# Patient Record
Sex: Male | Born: 1940 | Race: White | Hispanic: No | Marital: Married | Smoking: Never smoker
Health system: Southern US, Community
[De-identification: ages and names within clinical notes are randomized; demographics above are authoritative.]

## PROBLEM LIST (undated history)

## (undated) DIAGNOSIS — C9590 Leukemia, unspecified not having achieved remission: Secondary | ICD-10-CM

## (undated) DIAGNOSIS — N200 Calculus of kidney: Secondary | ICD-10-CM

## (undated) DIAGNOSIS — G629 Polyneuropathy, unspecified: Secondary | ICD-10-CM

## (undated) DIAGNOSIS — C4491 Basal cell carcinoma of skin, unspecified: Secondary | ICD-10-CM

## (undated) DIAGNOSIS — I10 Essential (primary) hypertension: Secondary | ICD-10-CM

## (undated) DIAGNOSIS — Z87442 Personal history of urinary calculi: Secondary | ICD-10-CM

## (undated) DIAGNOSIS — R739 Hyperglycemia, unspecified: Secondary | ICD-10-CM

## (undated) DIAGNOSIS — K5730 Diverticulosis of large intestine without perforation or abscess without bleeding: Secondary | ICD-10-CM

## (undated) DIAGNOSIS — G6181 Chronic inflammatory demyelinating polyneuritis: Secondary | ICD-10-CM

## (undated) DIAGNOSIS — B0229 Other postherpetic nervous system involvement: Secondary | ICD-10-CM

## (undated) DIAGNOSIS — R55 Syncope and collapse: Secondary | ICD-10-CM

## (undated) HISTORY — DX: Chronic inflammatory demyelinating polyneuritis: G61.81

## (undated) HISTORY — PX: OTHER SURGICAL HISTORY: SHX169

## (undated) HISTORY — DX: Diverticulosis of large intestine without perforation or abscess without bleeding: K57.30

## (undated) HISTORY — DX: Polyneuropathy, unspecified: G62.9

## (undated) HISTORY — DX: Essential (primary) hypertension: I10

## (undated) HISTORY — DX: Calculus of kidney: N20.0

## (undated) HISTORY — DX: Hyperglycemia, unspecified: R73.9

## (undated) HISTORY — DX: Syncope and collapse: R55

---

## 1986-02-02 HISTORY — PX: LUMBAR DISC SURGERY: SHX700

## 1991-02-03 DIAGNOSIS — N200 Calculus of kidney: Secondary | ICD-10-CM

## 1991-02-03 HISTORY — DX: Calculus of kidney: N20.0

## 1993-02-02 DIAGNOSIS — K5730 Diverticulosis of large intestine without perforation or abscess without bleeding: Secondary | ICD-10-CM

## 1993-02-02 HISTORY — DX: Diverticulosis of large intestine without perforation or abscess without bleeding: K57.30

## 1998-02-02 HISTORY — PX: LUMBAR DISC SURGERY: SHX700

## 1998-05-10 ENCOUNTER — Encounter: Payer: Self-pay | Admitting: Neurosurgery

## 1998-05-14 ENCOUNTER — Encounter: Payer: Self-pay | Admitting: Neurosurgery

## 1998-05-14 ENCOUNTER — Inpatient Hospital Stay (HOSPITAL_COMMUNITY): Admission: RE | Admit: 1998-05-14 | Discharge: 1998-05-15 | Payer: Self-pay | Admitting: Neurosurgery

## 1998-08-03 ENCOUNTER — Encounter: Payer: Self-pay | Admitting: Family Medicine

## 1999-09-03 ENCOUNTER — Encounter: Payer: Self-pay | Admitting: Family Medicine

## 1999-09-03 HISTORY — PX: DOPPLER ECHOCARDIOGRAPHY: SHX263

## 1999-09-23 ENCOUNTER — Encounter: Admission: RE | Admit: 1999-09-23 | Discharge: 1999-09-23 | Payer: Self-pay | Admitting: Family Medicine

## 1999-09-23 ENCOUNTER — Encounter: Payer: Self-pay | Admitting: Family Medicine

## 1999-10-20 ENCOUNTER — Encounter: Payer: Self-pay | Admitting: Surgery

## 1999-10-27 ENCOUNTER — Encounter (INDEPENDENT_AMBULATORY_CARE_PROVIDER_SITE_OTHER): Payer: Self-pay | Admitting: Specialist

## 1999-10-27 ENCOUNTER — Observation Stay (HOSPITAL_COMMUNITY): Admission: RE | Admit: 1999-10-27 | Discharge: 1999-10-29 | Payer: Self-pay | Admitting: Surgery

## 1999-10-27 ENCOUNTER — Encounter: Payer: Self-pay | Admitting: Surgery

## 1999-10-27 HISTORY — PX: CHOLECYSTECTOMY: SHX55

## 1999-10-28 ENCOUNTER — Encounter: Payer: Self-pay | Admitting: Gastroenterology

## 2000-11-02 ENCOUNTER — Encounter: Payer: Self-pay | Admitting: Family Medicine

## 2000-11-02 LAB — CONVERTED CEMR LAB: PSA: 0.8 ng/mL

## 2001-11-02 ENCOUNTER — Encounter: Payer: Self-pay | Admitting: Family Medicine

## 2001-11-02 LAB — CONVERTED CEMR LAB: PSA: 0.8 ng/mL

## 2002-12-04 ENCOUNTER — Encounter: Payer: Self-pay | Admitting: Family Medicine

## 2002-12-04 LAB — CONVERTED CEMR LAB: PSA: 0.9 ng/mL

## 2003-12-19 ENCOUNTER — Ambulatory Visit: Payer: Self-pay | Admitting: Family Medicine

## 2004-03-05 ENCOUNTER — Encounter: Payer: Self-pay | Admitting: Family Medicine

## 2004-03-05 LAB — CONVERTED CEMR LAB: PSA: 0.83 ng/mL

## 2004-03-19 ENCOUNTER — Ambulatory Visit: Payer: Self-pay | Admitting: Family Medicine

## 2004-03-21 ENCOUNTER — Ambulatory Visit: Payer: Self-pay | Admitting: Family Medicine

## 2004-04-02 ENCOUNTER — Ambulatory Visit: Payer: Self-pay | Admitting: Family Medicine

## 2004-06-17 ENCOUNTER — Observation Stay (HOSPITAL_COMMUNITY): Admission: RE | Admit: 2004-06-17 | Discharge: 2004-06-18 | Payer: Self-pay | Admitting: Surgery

## 2004-06-17 HISTORY — PX: HERNIA REPAIR: SHX51

## 2004-12-04 ENCOUNTER — Ambulatory Visit: Payer: Self-pay | Admitting: Family Medicine

## 2005-03-05 ENCOUNTER — Encounter: Payer: Self-pay | Admitting: Family Medicine

## 2005-03-05 LAB — CONVERTED CEMR LAB: PSA: 1.08 ng/mL

## 2005-03-25 ENCOUNTER — Ambulatory Visit: Payer: Self-pay | Admitting: Family Medicine

## 2005-03-31 ENCOUNTER — Ambulatory Visit: Payer: Self-pay | Admitting: Family Medicine

## 2005-04-09 ENCOUNTER — Ambulatory Visit: Payer: Self-pay | Admitting: Family Medicine

## 2005-06-16 ENCOUNTER — Ambulatory Visit: Payer: Self-pay | Admitting: Family Medicine

## 2005-08-06 ENCOUNTER — Encounter: Admission: RE | Admit: 2005-08-06 | Discharge: 2005-08-06 | Payer: Self-pay | Admitting: Neurology

## 2005-09-02 ENCOUNTER — Ambulatory Visit: Payer: Self-pay | Admitting: Family Medicine

## 2005-09-02 DIAGNOSIS — I10 Essential (primary) hypertension: Secondary | ICD-10-CM

## 2005-09-02 HISTORY — DX: Essential (primary) hypertension: I10

## 2005-09-16 ENCOUNTER — Ambulatory Visit: Payer: Self-pay | Admitting: Internal Medicine

## 2005-10-13 ENCOUNTER — Ambulatory Visit: Payer: Self-pay | Admitting: Family Medicine

## 2005-11-13 ENCOUNTER — Ambulatory Visit: Payer: Self-pay | Admitting: Family Medicine

## 2005-11-20 ENCOUNTER — Ambulatory Visit: Payer: Self-pay | Admitting: Family Medicine

## 2005-12-08 ENCOUNTER — Ambulatory Visit: Payer: Self-pay | Admitting: Gastroenterology

## 2005-12-09 ENCOUNTER — Encounter (INDEPENDENT_AMBULATORY_CARE_PROVIDER_SITE_OTHER): Payer: Self-pay | Admitting: *Deleted

## 2005-12-09 ENCOUNTER — Ambulatory Visit: Payer: Self-pay | Admitting: Gastroenterology

## 2005-12-19 HISTORY — PX: ESOPHAGOGASTRODUODENOSCOPY: SHX1529

## 2006-04-02 ENCOUNTER — Ambulatory Visit: Payer: Self-pay | Admitting: Family Medicine

## 2006-04-02 LAB — CONVERTED CEMR LAB
ALT: 60 units/L — ABNORMAL HIGH (ref 0–40)
AST: 40 units/L — ABNORMAL HIGH (ref 0–37)
Albumin: 4 g/dL (ref 3.5–5.2)
Alkaline Phosphatase: 66 units/L (ref 39–117)
BUN: 12 mg/dL (ref 6–23)
Basophils Absolute: 0 10*3/uL (ref 0.0–0.1)
Basophils Relative: 0.1 % (ref 0.0–1.0)
Bilirubin, Direct: 0.1 mg/dL (ref 0.0–0.3)
CO2: 31 meq/L (ref 19–32)
Calcium: 9.3 mg/dL (ref 8.4–10.5)
Chloride: 99 meq/L (ref 96–112)
Cholesterol: 193 mg/dL (ref 0–200)
Creatinine, Ser: 1.2 mg/dL (ref 0.4–1.5)
Eosinophils Absolute: 0.2 10*3/uL (ref 0.0–0.6)
Eosinophils Relative: 2.9 % (ref 0.0–5.0)
GFR calc Af Amer: 78 mL/min
GFR calc non Af Amer: 65 mL/min
Glucose, Bld: 104 mg/dL — ABNORMAL HIGH (ref 70–99)
HCT: 48.2 % (ref 39.0–52.0)
HDL: 37 mg/dL — ABNORMAL LOW (ref >39.0)
Hemoglobin: 16.3 g/dL (ref 13.0–17.0)
LDL Cholesterol: 128 mg/dL — ABNORMAL HIGH (ref 0–99)
Lymphocytes Relative: 14.8 % (ref 12.0–46.0)
MCHC: 33.8 g/dL (ref 30.0–36.0)
MCV: 91.4 fL (ref 78.0–100.0)
Monocytes Absolute: 0.7 10*3/uL (ref 0.2–0.7)
Monocytes Relative: 11.3 % — ABNORMAL HIGH (ref 3.0–11.0)
Neutro Abs: 4.4 10*3/uL (ref 1.4–7.7)
Neutrophils Relative %: 70.9 % (ref 43.0–77.0)
PSA: 1.18 ng/mL (ref 0.10–4.00)
Platelets: 283 10*3/uL (ref 150–400)
Potassium: 3.9 meq/L (ref 3.5–5.1)
RBC: 5.27 M/uL (ref 4.22–5.81)
RDW: 12.2 % (ref 11.5–14.6)
Sodium: 137 meq/L (ref 135–145)
TSH: 2.58 microintl units/mL (ref 0.35–5.50)
Total Bilirubin: 1.2 mg/dL (ref 0.3–1.2)
Total CHOL/HDL Ratio: 5.2
Total Protein: 6.3 g/dL (ref 6.0–8.3)
Triglycerides: 141 mg/dL (ref 0–149)
VLDL: 28 mg/dL (ref 0–40)
WBC: 6.2 10*3/uL (ref 4.5–10.5)

## 2006-04-08 ENCOUNTER — Ambulatory Visit: Payer: Self-pay | Admitting: Family Medicine

## 2006-04-22 ENCOUNTER — Ambulatory Visit: Payer: Self-pay | Admitting: Family Medicine

## 2006-04-22 LAB — CONVERTED CEMR LAB
Calcium: 8.9 mg/dL (ref 8.4–10.5)
Phosphorus: 3 mg/dL (ref 2.3–4.6)

## 2006-04-26 ENCOUNTER — Ambulatory Visit: Payer: Self-pay | Admitting: Family Medicine

## 2006-10-06 ENCOUNTER — Encounter: Payer: Self-pay | Admitting: Family Medicine

## 2006-10-06 DIAGNOSIS — R011 Cardiac murmur, unspecified: Secondary | ICD-10-CM | POA: Insufficient documentation

## 2006-10-06 DIAGNOSIS — G454 Transient global amnesia: Secondary | ICD-10-CM

## 2006-10-06 DIAGNOSIS — I10 Essential (primary) hypertension: Secondary | ICD-10-CM | POA: Insufficient documentation

## 2006-10-07 ENCOUNTER — Encounter: Payer: Self-pay | Admitting: Family Medicine

## 2006-10-19 ENCOUNTER — Ambulatory Visit: Payer: Self-pay | Admitting: Family Medicine

## 2006-10-19 LAB — CONVERTED CEMR LAB: Glucose, Bld: 98 mg/dL (ref 70–99)

## 2006-12-10 ENCOUNTER — Ambulatory Visit: Payer: Self-pay | Admitting: Family Medicine

## 2007-01-10 ENCOUNTER — Encounter: Payer: Self-pay | Admitting: Family Medicine

## 2007-04-14 ENCOUNTER — Ambulatory Visit: Payer: Self-pay | Admitting: Family Medicine

## 2007-04-17 LAB — CONVERTED CEMR LAB
Albumin: 4 g/dL (ref 3.5–5.2)
Alkaline Phosphatase: 55 units/L (ref 39–117)
BUN: 11 mg/dL (ref 6–23)
Basophils Absolute: 0 10*3/uL (ref 0.0–0.1)
Cholesterol: 183 mg/dL (ref 0–200)
Creatinine, Ser: 1.2 mg/dL (ref 0.4–1.5)
Direct LDL: 128.4 mg/dL
GFR calc Af Amer: 78 mL/min
HDL: 37.1 mg/dL — ABNORMAL LOW (ref >39.0)
Hemoglobin: 15.5 g/dL (ref 13.0–17.0)
LDL Cholesterol: 122 mg/dL — ABNORMAL HIGH (ref 0–99)
MCHC: 33.6 g/dL (ref 30.0–36.0)
Microalb Creat Ratio: 5.2 mg/g (ref 0.0–30.0)
Microalb, Ur: 0.8 mg/dL (ref 0.0–1.9)
Monocytes Absolute: 0.6 10*3/uL (ref 0.2–0.7)
Monocytes Relative: 9.8 % (ref 3.0–11.0)
Platelets: 246 10*3/uL (ref 150–400)
Potassium: 4.1 meq/L (ref 3.5–5.1)
RDW: 12.4 % (ref 11.5–14.6)
TSH: 1.88 microintl units/mL (ref 0.35–5.50)
Total Bilirubin: 1.2 mg/dL (ref 0.3–1.2)
Total CHOL/HDL Ratio: 4.9
Triglycerides: 119 mg/dL (ref 0–149)

## 2007-04-18 ENCOUNTER — Ambulatory Visit: Payer: Self-pay | Admitting: Family Medicine

## 2007-05-06 ENCOUNTER — Ambulatory Visit: Payer: Self-pay | Admitting: Family Medicine

## 2007-05-09 ENCOUNTER — Encounter (INDEPENDENT_AMBULATORY_CARE_PROVIDER_SITE_OTHER): Payer: Self-pay | Admitting: *Deleted

## 2007-05-16 ENCOUNTER — Telehealth: Payer: Self-pay | Admitting: Family Medicine

## 2007-06-17 ENCOUNTER — Encounter: Payer: Self-pay | Admitting: Family Medicine

## 2007-06-30 ENCOUNTER — Ambulatory Visit: Payer: Self-pay | Admitting: Family Medicine

## 2007-08-04 ENCOUNTER — Other Ambulatory Visit: Payer: Self-pay

## 2007-08-05 ENCOUNTER — Inpatient Hospital Stay: Payer: Self-pay | Admitting: Internal Medicine

## 2007-08-05 ENCOUNTER — Encounter: Payer: Self-pay | Admitting: Family Medicine

## 2007-08-05 DIAGNOSIS — R55 Syncope and collapse: Secondary | ICD-10-CM

## 2007-08-05 HISTORY — DX: Syncope and collapse: R55

## 2007-08-18 ENCOUNTER — Ambulatory Visit: Payer: Self-pay | Admitting: Family Medicine

## 2007-09-28 ENCOUNTER — Ambulatory Visit: Payer: Self-pay | Admitting: Family Medicine

## 2007-09-28 LAB — CONVERTED CEMR LAB
BUN: 16 mg/dL (ref 6–23)
CO2: 28 meq/L (ref 19–32)
Calcium: 8.5 mg/dL (ref 8.4–10.5)
Creatinine, Ser: 1.1 mg/dL (ref 0.4–1.5)
Glucose, Bld: 94 mg/dL (ref 70–99)

## 2007-10-12 ENCOUNTER — Ambulatory Visit: Payer: Self-pay | Admitting: Family Medicine

## 2007-11-07 ENCOUNTER — Ambulatory Visit: Payer: Self-pay | Admitting: Family Medicine

## 2007-11-21 ENCOUNTER — Encounter: Payer: Self-pay | Admitting: Family Medicine

## 2008-02-06 ENCOUNTER — Ambulatory Visit: Payer: Self-pay | Admitting: Family Medicine

## 2008-03-28 ENCOUNTER — Encounter: Payer: Self-pay | Admitting: Family Medicine

## 2008-04-19 ENCOUNTER — Ambulatory Visit: Payer: Self-pay | Admitting: Family Medicine

## 2008-04-19 LAB — CONVERTED CEMR LAB
ALT: 58 units/L — ABNORMAL HIGH (ref 0–53)
AST: 33 units/L (ref 0–37)
Basophils Relative: 0.2 % (ref 0.0–3.0)
Bilirubin, Direct: 0 mg/dL (ref 0.0–0.3)
Chloride: 106 meq/L (ref 96–112)
Eosinophils Relative: 2.6 % (ref 0.0–5.0)
HCT: 45.7 % (ref 39.0–52.0)
LDL Cholesterol: 120 mg/dL — ABNORMAL HIGH (ref 0–99)
MCV: 95.6 fL (ref 78.0–100.0)
Microalb, Ur: 1.2 mg/dL (ref 0.0–1.9)
Monocytes Absolute: 0.7 10*3/uL (ref 0.1–1.0)
Neutrophils Relative %: 71.3 % (ref 43.0–77.0)
Potassium: 3.9 meq/L (ref 3.5–5.1)
RBC: 4.78 M/uL (ref 4.22–5.81)
Total CHOL/HDL Ratio: 5
Total Protein: 6.3 g/dL (ref 6.0–8.3)
WBC: 7 10*3/uL (ref 4.5–10.5)

## 2008-04-25 ENCOUNTER — Ambulatory Visit: Payer: Self-pay | Admitting: Family Medicine

## 2008-04-26 LAB — CONVERTED CEMR LAB
Iron: 158 ug/dL (ref 42–165)
Saturation Ratios: 56.5 % — ABNORMAL HIGH (ref 20.0–50.0)
Transferrin: 199.8 mg/dL — ABNORMAL LOW (ref 212.0–360.0)
Vitamin B-12: 426 pg/mL (ref 211–911)

## 2008-10-29 ENCOUNTER — Ambulatory Visit: Payer: Self-pay | Admitting: Family Medicine

## 2008-11-19 ENCOUNTER — Encounter: Payer: Self-pay | Admitting: Family Medicine

## 2008-11-22 ENCOUNTER — Ambulatory Visit: Payer: Self-pay | Admitting: Family Medicine

## 2009-05-23 ENCOUNTER — Ambulatory Visit: Payer: Self-pay | Admitting: Family Medicine

## 2009-05-23 LAB — CONVERTED CEMR LAB
Alkaline Phosphatase: 51 units/L (ref 39–117)
Basophils Absolute: 0 10*3/uL (ref 0.0–0.1)
Bilirubin, Direct: 0.2 mg/dL (ref 0.0–0.3)
CO2: 30 meq/L (ref 19–32)
Calcium: 9.4 mg/dL (ref 8.4–10.5)
Eosinophils Relative: 2.8 % (ref 0.0–5.0)
GFR calc non Af Amer: 63.79 mL/min (ref >60)
HDL: 36.2 mg/dL — ABNORMAL LOW (ref >39.00)
LDL Cholesterol: 107 mg/dL — ABNORMAL HIGH (ref 0–99)
Monocytes Relative: 8.9 % (ref 3.0–12.0)
Neutrophils Relative %: 70.6 % (ref 43.0–77.0)
PSA: 1.58 ng/mL (ref 0.10–4.00)
Platelets: 225 10*3/uL (ref 150.0–400.0)
RDW: 13.2 % (ref 11.5–14.6)
Sodium: 145 meq/L (ref 135–145)
TSH: 1.78 microintl units/mL (ref 0.35–5.50)
Total Bilirubin: 0.7 mg/dL (ref 0.3–1.2)
Total CHOL/HDL Ratio: 5
Triglycerides: 146 mg/dL (ref 0.0–149.0)
VLDL: 29.2 mg/dL (ref 0.0–40.0)
WBC: 5.9 10*3/uL (ref 4.5–10.5)

## 2009-05-27 ENCOUNTER — Ambulatory Visit: Payer: Self-pay | Admitting: Family Medicine

## 2009-05-27 LAB — CONVERTED CEMR LAB
Creatinine,U: 113.5 mg/dL
Microalb Creat Ratio: 18.5 mg/g (ref 0.0–30.0)

## 2009-06-14 ENCOUNTER — Ambulatory Visit: Payer: Self-pay | Admitting: Family Medicine

## 2009-06-14 DIAGNOSIS — J209 Acute bronchitis, unspecified: Secondary | ICD-10-CM

## 2009-06-14 LAB — CONVERTED CEMR LAB: OCCULT 3: NEGATIVE

## 2009-06-17 ENCOUNTER — Encounter (INDEPENDENT_AMBULATORY_CARE_PROVIDER_SITE_OTHER): Payer: Self-pay | Admitting: *Deleted

## 2009-07-08 ENCOUNTER — Encounter: Payer: Self-pay | Admitting: Family Medicine

## 2009-09-05 ENCOUNTER — Encounter (INDEPENDENT_AMBULATORY_CARE_PROVIDER_SITE_OTHER): Payer: Self-pay | Admitting: *Deleted

## 2009-11-04 ENCOUNTER — Ambulatory Visit: Payer: Self-pay | Admitting: Family Medicine

## 2009-11-05 LAB — CONVERTED CEMR LAB
CO2: 30 meq/L (ref 19–32)
Chloride: 101 meq/L (ref 96–112)
Creatinine, Ser: 1.1 mg/dL (ref 0.4–1.5)
Hgb A1c MFr Bld: 6.1 % (ref 4.6–6.5)
Potassium: 4.5 meq/L (ref 3.5–5.1)

## 2010-01-21 ENCOUNTER — Encounter: Payer: Self-pay | Admitting: Family Medicine

## 2010-03-04 NOTE — Consult Note (Signed)
Summary: Dr.Michael Cartwright,[facility],Note  Dr.Michael Cartwright,[facility],Note   Imported By: Beau Fanny 07/09/2009 13:07:52  _____________________________________________________________________  External Attachment:    Type:   Image     Comment:   External Document

## 2010-03-04 NOTE — Assessment & Plan Note (Signed)
Summary: ACUTE- CHEST COUGH  CYD   Vital Signs:  Patient profile:   70 year old male Height:      76.5 inches Weight:      220.0 pounds BMI:     26.53 O2 Sat:      97 % on Room air Temp:     97.9 degrees F oral Pulse rate:   69 / minute Pulse rhythm:   regular BP sitting:   110 / 70  (left arm) Cuff size:   regular  Vitals Entered By: Benny Lennert CMA Duncan Dull) (Jun 14, 2009 12:13 PM)  O2 Flow:  Room air  History of Present Illness: Chief complaint chest cold  No antibiotics in last 2-3 months.  On prednisone for neuropathy. ..since 2007.  Using guafensin.No decongestant.   Acute Visit History:      The patient complains of cough, nasal discharge, sinus problems, and sore throat.  These symptoms began 6 days ago.  He denies earache, fever, and vomiting.  Other comments include: sneeze..clear mucus initially..no green nasal discharge and productive cough.        The patient notes wheezing and shortness of breath.  The cough interferes with his sleep.  The character of the cough is described as productive.        He complains of sinus pressure and ears being blocked.        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 One Tab By Mouth At Night. 2)  Multivitamins   Tabs (Multiple Vitamin) .... Take One By Mouth Once A Day 3)  Caltrate 600 + D .... 1 Twice A Day By Mouth 4)  Aspirin 325 Mg  Tbec (Aspirin) .... Take One By Mouth Once A Day 5)  Prednisone 10 Mg  Tabs (Prednisone) .Marland Kitchen.. 1 Tablet By Mouth Every Day 6)  Timoptic 0.5 % Soln (Timolol Maleate) .Marland Kitchen.. 1 Drop in Each Eye At Bedtime 7)  B-12 Dots 500 Mcg Subl (Cyanocobalamin) .... Once A Day 8)  Azithromycin 250 Mg Tabs (Azithromycin) .... 2 Tab By Mouth X 1 , Then 1 Tab By Mouth Daily  Allergies: 1)  * Sulfa (Sulfonamides) Group  Past History:  Past medical, surgical, family and social histories (including risk factors) reviewed, and no changes noted (except as noted below).  Past Medical History: Reviewed history from 10/06/2006 and no changes required. Diverticulosis, colon Hypertension 08/07  Past Surgical History: Reviewed history from 04/25/2008 and no changes required. Lumbar surgery  1988 L3/L4 disc rupture with surgery 2000 Kidney stone x 2 1993 Cholelithiasis via abd U/S 01/95 ACBE diverticula 01/95 ABD U/S fatty liver 08/01 Cholecystectomy Daphine Deutscher)  10/27/99 ECHO trace T.R.: T.R. P/R. borderline LVH 08/01 Carotid U/S wnl 04/02 Head MRI/EEG 03/02 EEG wnl 07/03 Lap bilat hernia repair 06/17/04  NCV/EMG 07/09/05 EGD, gastritis, H. H. 12/19/05 Colonoscopy, colitis infec. vs ulcerative (Dr Jarold Motto) 02/05/06 Dexa wnl 04/12/06 HOSP [facility] Syncope presumed hypotension (Observation) 08/05/07  Family History: Reviewed history from 05/27/2009 and no changes required. Father: dec 86  Parkinson disease and kidney failure Mother: dec 72 MI HTN Brother dec 77  Ca of liver cirrhosis, nonalcoholic Sister A 79  elevated white count now diagnosed with acute leukemia  Sister A 74 arthritis. CV + Mother BP + Mother DM - none CA - none Stroke - none  Social History: Reviewed history from 04/25/2008 and no changes  required. 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 CV:  Denies chest pain or discomfort. GI:  Denies abdominal pain.  Physical Exam  General:  Well-developed,well-nourished,in no acute distress; alert,appropriate and cooperative throughout examination Head:  no maxiallry sinus ttp Ears:  clear fluid B TMs Nose:  nasal dischargemucosal pallor.   Mouth:  Oral mucosa and oropharynx without lesions or exudates.  Teeth in good repair. Neck:  no carotid bruit or thyromegaly no cervical or supraclavicular lymphadenopathy  Lungs:  Normal respiratory effort, chest expands symmetrically. Lungs are clear to auscultation, no crackles or wheezes. Heart:  Normal rate and regular rhythm. S1 and S2 normal without gallop, murmur, click, rub or other extra sounds. Pulses:  R and L carotid,radial,femoral,dorsalis pedis and posterior tibial pulses are full and equal bilaterally   Impression & Recommendations:  Problem # 1:  ACUTE BRONCHITIS (ICD-466.0)  Take antibiotics and other medications as directed. Encouraged to push clear liquids, get enough rest, and take acetaminophen as needed. To be seen in 5-7 days if no improvement, sooner if worse.  His updated medication list for this problem includes:    Azithromycin 250 Mg Tabs (Azithromycin) .Marland Kitchen... 2 tab by mouth x 1 , then 1 tab by mouth daily  Orders: Prescription Created Electronically [phone])  Complete Medication List: 1)  Lisinopril 5 Mg Tabs (Lisinopril) .... One tab by mouth at night. 2)  Multivitamins Tabs (Multiple vitamin) .... Take one by mouth once a day 3)  Caltrate 600 + D  .... 1 twice a day by mouth 4)  Aspirin 325 Mg Tbec (Aspirin) .... Take one by mouth once a day 5)  Prednisone 10 Mg Tabs (Prednisone) .Marland Kitchen.. 1 tablet by mouth every day 6)   Timoptic 0.5 % Soln (Timolol maleate) .Marland Kitchen.. 1 drop in each eye at bedtime 7)  B-12 Dots 500 Mcg Subl (Cyanocobalamin) .... Once a day 8)  Azithromycin 250 Mg Tabs (Azithromycin) .... 2 tab by mouth x 1 , then 1 tab by mouth daily  Patient Instructions: 1)  Guafenesin, nasal saline irrigation.  2)   Start antibiotics. 3)   Call if not improvng in 48-72 hours days. Go to ER if severe SOB.  Prescriptions: AZITHROMYCIN 250 MG TABS (AZITHROMYCIN) 2 tab by mouth x 1 , then 1 tab by mouth daily  #6 x 0   Entered and Authorized by:   Kerby Nora MD   Signed by:   Kerby Nora MD on 06/14/2009   Method used:   Electronically to        [id number] * (retail)       [address]  [place], [place]  [address]       Ph: [phone] or [phone]       Fax: [phone]   RxID:   [id number]   Current Allergies (reviewed today): * SULFA (SULFONAMIDES) GROUP

## 2010-03-04 NOTE — Letter (Signed)
Summary: Nadara Eaton letter  [place] at [facility]  [address] [place], [place] [address]   Phone: [phone]  Fax: [phone]       09/05/2009 MRN: [id number]  JOSECARLOS HARRIOTT [address] [facility] [address] [place], [place]  [address]  Dear Mr. [facility] [place], and [place] announce the retirement of Arta Silence, M.D., from full-time practice at the [facility] office effective August 01, 2009 and his plans of returning part-time.  It is important to Dr. Hetty Ely and to our practice that you understand that [facility] has seven physicians in our office for your health care needs.  We will continue to offer the same exceptional care that you have today.    Dr. Hetty Ely has spoken to many of you about his plans for retirement and returning part-time in the fall.   We will continue to work with you through the transition to schedule appointments for you in the office and meet the high standards that [facility] is committed to.   Again, it is with great pleasure that we share the news that Dr. Hetty Ely will return to [facility] in October of 2011 with a reduced schedule.    If you have any questions, or would like to request an appointment with one of our physicians, please call us at [phone] and press the option for Scheduling an appointment.  We take pleasure in providing you with excellent patient care and look forward to seeing you at your next office visit.  Our [facility] Physicians are:  Tillman Abide, M.D. Laurita Quint, M.D. Roxy Manns, M.D. Kerby Nora, M.D. Hannah Beat, M.D. Ruthe Mannan, M.D. We proudly welcomed Raechel Ache, M.D. and Eustaquio Boyden, M.D. to the practice in July/August 2011.  Sincerely,  [facility]

## 2010-03-04 NOTE — Letter (Signed)
Summary: Results Follow up Letter  [place] at [facility]  [address] [place], [place] [address]   Phone: [phone]  Fax: [phone]    06/17/2009 MRN: [id number]  JEANPIERRE THEBEAU [address] [place], [place]  [address]  Dear Mr. Campas,  The following are the results of your recent test(s):  Test         Result    Pap Smear:        Normal _____  Not Normal _____ Comments: ______________________________________________________ Cholesterol: LDL(Bad cholesterol):         Your goal is less than:         HDL (Good cholesterol):       Your goal is more than: Comments:  ______________________________________________________ Mammogram:        Normal _____  Not Normal _____ Comments:  ___________________________________________________________________ Hemoccult:        Normal __X___  Not normal _______ Comments:  Please repeat in one year.  _____________________________________________________________________ Other Tests:    We routinely do not discuss normal results over the telephone.  If you desire a copy of the results, or you have any questions about this information we can discuss them at your next office visit.   Sincerely,    Laurita Quint, MD

## 2010-03-04 NOTE — Assessment & Plan Note (Signed)
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  Has had some motor weakness in legs.  Due for follow up of labs.  "I feel fine but I'm weaker than before."  Still able to transfer and do ADLs, but can't run.  Loss of sensation on feet and decrease in bilateral lower extremity reflexes.    Mild increase in glucose after starting prednisone.  No formal dx of DM2.  Here for routine labs.   HTN- noted only after started prednisone and w/o CP/SOB/BLE edema.  Doing well and w/o complaint concerning BP med.    Allergies: 1)  * Sulfa (Sulfonamides) Group  Past History:  Past Medical History: Diverticulosis, colon Hypertension 08/07 chronic inflammatory demylenating polyneuropathy- prednisone per [facility] hyperglycemia related to chronic steroid use  Social History: Reviewed history from 06/14/2009 and no changes required. 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  Never Smoked  Review of Systems       See HPI.  Otherwise negative.    Physical Exam  General:  GEN: nad, alert and oriented HEENT: mucous membranes moist NECK: supple w/o LA CV: rrr.  no murmur PULM: ctab, no inc wob ABD: soft, +bs EXT: no edema SKIN: no acute rash  B feet with lack on sensation to touch or pinprick.  2+ DP pulses and no skin breakdown.    Impression & Recommendations:  Problem # 1:   NEUROPATHY, OTHER INFLAMMATORY AND TOXIC, JACKSON, STEROIDS (ICD-357.89) Continue prednisone per [facility].   Pt had questions about the shingles vaccine.  I directed him to talk with [facility].  If they support him getting it, he can check with insurance about coverage and then contact our clinic.  He understood.   Problem # 2:  HYPERTENSION (ICD-401.9) No change in meds.  contact with labs.  His updated medication list for this problem includes:    Lisinopril 5 Mg Tabs (Lisinopril) ..... One tab by mouth at night.  Orders: TLB-BMP (Basic Metabolic Panel-BMET) (80048-METABOL)  Problem # 3:  HYPERGLYCEMIA, 104 (ICD-790.29) No change in meds.  contact with labs.  Orders: TLB-A1C / Hgb A1C (Glycohemoglobin) (83036-A1C)  Complete Medication List: 1)  Lisinopril 5 Mg Tabs (Lisinopril) .... One tab by mouth at night. 2)  Multivitamins Tabs (Multiple vitamin) .... Take one by mouth once a day 3)  Caltrate 600 + D  .... 1 twice a day by mouth 4)  Aspirin 325 Mg Tbec (Aspirin) .... Take one by mouth once a day 5)  Prednisone 10 Mg Tabs (Prednisone) .Marland Kitchen.. 1 tablet by mouth every day 6)  Timoptic 0.5 % Soln (Timolol maleate) .Marland Kitchen.. 1 drop in each eye at bedtime 7)  B-12 Dots 500 Mcg Subl (Cyanocobalamin) .... Once a day  Other Orders: Flu  Vaccine 60yrs + MEDICARE PATIENTS [phone]) Administration Flu vaccine - MCR (Z3664)   Patient Instructions: 1)  Come back for a physical in May 2012. 2)  Labs before the visit (cmet/lipid/PSA/A1c). 3)  Let me know if you have concerns in the meantime.    Current Allergies (reviewed today): * SULFA (SULFONAMIDES) GROUP    Flu Vaccine Consent Questions     Do you have a history of severe allergic reactions to this vaccine? no    Any prior history of allergic reactions to egg and/or gelatin? no    Do you have a sensitivity to the preservative Thimersol? no    Do you have a past history of Guillan-Barre Syndrome? no    Do you currently have an acute febrile illness?  no    Have you ever had a severe reaction to latex? no    Vaccine information given and explained to patient? yes    Are you currently pregnant? no    Lot Number:[id number]   Exp Date:08/02/2010   Site Given  Left Deltoid IMflu Lugene Fuquay CMA (AAMA)  November 04, 2009 12:16 PM

## 2010-03-04 NOTE — Assessment & Plan Note (Signed)
Summary: COMPLETE PHYSICAL   Vital Signs:  Patient profile:   70 year old male Height:      76.5 inches Weight:      221.25 pounds BMI:     26.68 Temp:     98.0 degrees F oral Pulse rate:   72 / minute Pulse rhythm:   regular BP sitting:   118 / 78  (left arm) Cuff size:   regular  Vitals Entered By: Sydell Axon LPN (May 27, 2009 9:21 AM) CC: 30 minute checkup, hemoccult cards given to patient   History of Present Illness: Pt here for followup. Was seen by Neurology at [facility] and the only cheange was to add B12 replacement at 500mg  a day. He wass continued on Prednisone 10mg . He has been told not to overdue activity so as not to further  damage muscles. He denies other problems and feels well. He had a good Easter.  Preventive Screening-Counseling & Management  Alcohol-Tobacco     Alcohol drinks/day: 0     Smoking Status: never     Passive Smoke Exposure: no  Caffeine-Diet-Exercise     Caffeine use/day: none, some sodas     Does Patient Exercise: yes     Type of exercise: as much as poss, bicycle and walking.     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 One Tab By Mouth At Night. 2)  Multivitamins   Tabs (Multiple Vitamin) .... Take One By Mouth Once A Day 3)   Caltrate 600 + D .... 1 Twice A Day By Mouth 4)  Aspirin 325 Mg  Tbec (Aspirin) .... Take One By Mouth Once A Day 5)  Prednisone 10 Mg  Tabs (Prednisone) .Marland Kitchen.. 1 Tablet By Mouth Every Day 6)  Timoptic 0.5 % Soln (Timolol Maleate) .Marland Kitchen.. 1 Drop in Each Eye At Bedtime  Allergies: 1)  * Sulfa (Sulfonamides) Group  Past History:  Past Medical History: Last updated: 10/06/2006 Diverticulosis, colon Hypertension 08/07  Past Surgical History: Last updated: 04/25/2008 Lumbar surgery  1988 L3/L4 disc rupture with surgery 2000 Kidney stone x 2 1993 Cholelithiasis via abd U/S 01/95 ACBE diverticula 01/95 ABD U/S fatty liver 08/01 Cholecystectomy Daphine Deutscher)  10/27/99 ECHO trace T.R.: T.R. P/R. borderline LVH 08/01 Carotid U/S wnl 04/02 Head MRI/EEG 03/02 EEG wnl 07/03 Lap bilat hernia repair 06/17/04 NCV/EMG 07/09/05 EGD, gastritis, H. H. 12/19/05 Colonoscopy, colitis infec. vs ulcerative (Dr Jarold Motto) 02/05/06 Dexa wnl 04/12/06 HOSP [facility] Syncope presumed hypotension (Observation) 08/05/07  Family History: Last updated: 05/27/2009 Father: dec 86  Parkinson disease and kidney failure Mother: dec 72 MI HTN Brother dec 77  Ca of liver cirrhosis, nonalcoholic Sister A 79  elevated white count now diagnosed with acute leukemia  Sister A 74 arthritis. 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 CV + Mother BP + Mother DM - none CA - none Stroke - none  Social History: Caffeine use/day:  none, some sodas  Review of Systems General:  Complains of weakness; denies chills, fatigue, fever, sweats, and weight loss; in legs now getting generalized.. Eyes:  Denies blurring, discharge, eye pain, and itching. ENT:  Denies decreased hearing, ear discharge, and earache. CV:  Complains of swelling of feet; denies chest pain or discomfort, fainting, fatigue, palpitations, shortness of breath with exertion, and swelling of hands; rare with being up.Marland Kitchen Resp:  Denies chest pain with inspiration, shortness of breath, and wheezing. GI:  Denies abdominal pain, bloody stools, change in bowel habits, constipation, dark tarry stools, diarrhea, indigestion, loss of appetite, nausea, vomiting, vomiting blood, and yellowish skin color. GU:  Denies discharge, dysuria, nocturia, and urinary frequency. MS:  Complains of muscle weakness; denies joint pain, low back pain, and muscle aches; chronic of the legs worse than anywhere else.. Derm:  Denies dryness, itching, and rash. Neuro:  Complains of numbness and poor balance; denies tingling and tremors; Balance esp backwards.Marland KitchenMarland KitchenMarland KitchenNumbness of feet mild.Marland Kitchen  Physical Exam  General:  Well-developed,well-nourished,in no acute distress; alert,appropriate and cooperative throughout examination Head:  Normocephalic and atraumatic without obvious abnormalities. No apparent alopecia or balding. Eyes:  Conjunctiva clear bilaterally.  Ears:  External ear exam shows no significant lesions or deformities.  Otoscopic examination reveals clear canals, tympanic membranes are intact bilaterally without bulging, retraction, inflammation or discharge. Hearing is grossly normal bilaterally. Nose:  External nasal examination shows no deformity or inflammation. Nasal mucosa are pink and moist without lesions  or exudates. Mouth:  Oral mucosa and oropharynx without lesions or exudates.  Teeth in good repair. Neck:  No deformities, masses, or tenderness noted. Chest Wall:  No deformities, masses, tenderness or gynecomastia noted. Can see chest wall muscle wasting on the right pectoralis area. Breasts:  No masses or gynecomastia noted Lungs:  Normal respiratory effort, chest expands symmetrically. Lungs are clear to auscultation, no crackles or wheezes. Heart:  Normal rate and regular rhythm. S1 and S2 normal without gallop, murmur, click, rub or other extra sounds. Abdomen:  Bowel sounds positive,abdomen soft and non-tender without masses, organomegaly or hernias noted. Rectal:  No external abnormalities noted. Normal sphincter tone. No rectal masses or tenderness. Gneg. Genitalia:  Testes bilaterally descended without nodularity, tenderness or masses. No scrotal masses or lesions. No penis lesions or urethral discharge. Prostate:  Prostate gland firm and smooth, no enlargement, nodularity, tenderness, mass, asymmetry or induration. 20 gms. Msk:  No deformity or scoliosis noted of thoracic or lumbar spine.  2-3cm vertical incision over middle of vastus lateralis of left leg, healed well. Mild weakness of the bilat upper legs. Obvious muscle wasting of right anterior chestwall. Pulses:  R and L carotid,radial,femoral,dorsalis pedis and posterior tibial pulses are full and equal bilaterally Extremities:  Gross atrophy of muscle mass,  most evident in the thighs, prox> distal. Neurologic:  No cranial nerve deficits noted. Station and gait are normal for him, halting and "flap foot placing." Plantar reflexes are down-going bilaterally. DTRs are symmetrical throughout. Sensory, motor and coordinative functions appear intact but LE useage and gait decreased. Proximal LE muscles> distal  weak. Skin:  Intact without suspicious lesions or rashes Cervical Nodes:  No lymphadenopathy noted Inguinal Nodes:  No  significant adenopathy Psych:  Cognition and judgment appear intact. Alert and cooperative with normal attention span and concentration. No apparent delusions, illusions, hallucinations   Impression & Recommendations:  Problem # 1:  VASOVAGAL SYNCOPE (ICD-780.2) Assessment Improved None for a few years.  Problem # 2:  SPECIAL SCREENING MALIGNANT NEOPLASM OF PROSTATE (ICD-V76.44) Assessment: Unchanged Stable PSA and exam.  Problem # 3:  NEUROPATHY, OTHER INFLAMMATORY AND TOXIC, JACKSON, STEROIDS (ICD-357.89) Assessment: Deteriorated Seems slowly progressing. Per GUY. Cont B12.  Problem # 4:  GASTRITIS VIA EGD (ICD-535.50) Assessment: Unchanged Stable, no overt sxs.  Problem # 5:  HYPERTENSION (ICD-401.9) Assessment: Unchanged Good controol, stable. His updated medication list for this problem includes:    Lisinopril 5 Mg Tabs (Lisinopril) ..... One tab by mouth at night.  BP today: 118/78 Prior BP: 130/84 (10/29/2008)  Labs Reviewed: K+: 4.3 (05/23/2009) Creat: : 1.2 (05/23/2009)   Chol: 172 (05/23/2009)   HDL: 36.20 (05/23/2009)   LDL: 107 (05/23/2009)   TG: 146.0 (05/23/2009)  Problem # 6:  HYPERGLYCEMIA, 104 (ICD-790.29) Assessment: Improved Euglycemic today. Cont to watch sweets and carbs. Orders: TLB-Microalbumin/Creat Ratio, Urine ([id number])  Labs Reviewed: Creat: 1.2 (05/23/2009)     Problem # 7:  HYPERCHOLESTEROLEMIA, 189/LDL 135 (ICD-272.0) Assessment: Unchanged Adequate but always try to avoid fatty foods to decrease LDL. 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  Problem # 9:  DIVERTICULOSIS, COLON, VIA ACBE (ICD-562.10) Discussed being seen for prolonged LLQ  dicomfort. Labs Reviewed: Hgb: 15.4 (05/23/2009)   Hct: 45.5 (05/23/2009)   WBC: 5.9 (05/23/2009)  Complete Medication List: 1)  Lisinopril 5 Mg Tabs (Lisinopril) .... One tab by mouth at night. 2)  Multivitamins Tabs (Multiple vitamin) .... Take one by mouth once a day 3)  Caltrate 600 + D  .... 1 twice a day by mouth 4)  Aspirin 325 Mg Tbec (Aspirin) .... Take one by mouth once a day 5)  Prednisone 10 Mg Tabs (Prednisone) .Marland Kitchen.. 1 tablet by mouth every day 6)  Timoptic 0.5 % Soln (Timolol maleate) .Marland Kitchen.. 1 drop in each eye at bedtime 7)  B-12 Dots 500 Mcg Subl (Cyanocobalamin) .... Once a day  Patient Instructions: 1)  Call in May for 6 mo followup  appt in Oct with labs prior.  Current Allergies (reviewed today): * SULFA (SULFONAMIDES) GROUP

## 2010-03-06 NOTE — Letter (Signed)
Summary: [facility]  [facility]   Imported By: Lanelle Bal 02/04/2010 10:00:15  _____________________________________________________________________  External Attachment:    Type:   Image     Comment:   External Document  Appended Document: [facility]     Clinical Lists Changes

## 2010-04-09 ENCOUNTER — Encounter: Payer: Self-pay | Admitting: Family Medicine

## 2010-05-29 ENCOUNTER — Other Ambulatory Visit: Payer: Self-pay | Admitting: Family Medicine

## 2010-05-29 DIAGNOSIS — Z125 Encounter for screening for malignant neoplasm of prostate: Secondary | ICD-10-CM

## 2010-05-29 DIAGNOSIS — R739 Hyperglycemia, unspecified: Secondary | ICD-10-CM

## 2010-05-29 DIAGNOSIS — I10 Essential (primary) hypertension: Secondary | ICD-10-CM

## 2010-05-29 DIAGNOSIS — E7800 Pure hypercholesterolemia, unspecified: Secondary | ICD-10-CM

## 2010-05-30 ENCOUNTER — Other Ambulatory Visit: Payer: Self-pay | Admitting: Family Medicine

## 2010-06-03 ENCOUNTER — Other Ambulatory Visit (INDEPENDENT_AMBULATORY_CARE_PROVIDER_SITE_OTHER): Payer: Medicare Other | Admitting: Family Medicine

## 2010-06-03 DIAGNOSIS — R739 Hyperglycemia, unspecified: Secondary | ICD-10-CM

## 2010-06-03 DIAGNOSIS — R7309 Other abnormal glucose: Secondary | ICD-10-CM

## 2010-06-03 DIAGNOSIS — I10 Essential (primary) hypertension: Secondary | ICD-10-CM

## 2010-06-03 DIAGNOSIS — Z13220 Encounter for screening for lipoid disorders: Secondary | ICD-10-CM

## 2010-06-03 DIAGNOSIS — Z125 Encounter for screening for malignant neoplasm of prostate: Secondary | ICD-10-CM

## 2010-06-03 DIAGNOSIS — E7800 Pure hypercholesterolemia, unspecified: Secondary | ICD-10-CM

## 2010-06-03 LAB — COMPREHENSIVE METABOLIC PANEL
ALT: 48 U/L (ref 0–53)
BUN: 15 mg/dL (ref 6–23)
CO2: 31 mEq/L (ref 19–32)
Creatinine, Ser: 1 mg/dL (ref 0.4–1.5)
GFR: 76.72 mL/min (ref >60.00)
Total Bilirubin: 1.1 mg/dL (ref 0.3–1.2)

## 2010-06-03 LAB — LIPID PANEL
Cholesterol: 207 mg/dL — ABNORMAL HIGH (ref 0–200)
HDL: 41.7 mg/dL (ref >39.00)
Triglycerides: 200 mg/dL — ABNORMAL HIGH (ref 0.0–149.0)
VLDL: 40 mg/dL (ref 0.0–40.0)

## 2010-06-03 LAB — HEMOGLOBIN A1C: Hgb A1c MFr Bld: 6.2 % (ref 4.6–6.5)

## 2010-06-05 ENCOUNTER — Ambulatory Visit (INDEPENDENT_AMBULATORY_CARE_PROVIDER_SITE_OTHER): Payer: Medicare Other | Admitting: Family Medicine

## 2010-06-05 ENCOUNTER — Encounter: Payer: Self-pay | Admitting: Family Medicine

## 2010-06-05 DIAGNOSIS — G6181 Chronic inflammatory demyelinating polyneuritis: Secondary | ICD-10-CM | POA: Insufficient documentation

## 2010-06-05 DIAGNOSIS — G7241 Inclusion body myositis [IBM]: Secondary | ICD-10-CM | POA: Insufficient documentation

## 2010-06-05 DIAGNOSIS — R7309 Other abnormal glucose: Secondary | ICD-10-CM

## 2010-06-05 DIAGNOSIS — Z1211 Encounter for screening for malignant neoplasm of colon: Secondary | ICD-10-CM

## 2010-06-05 DIAGNOSIS — M6281 Muscle weakness (generalized): Secondary | ICD-10-CM

## 2010-06-05 DIAGNOSIS — Z125 Encounter for screening for malignant neoplasm of prostate: Secondary | ICD-10-CM

## 2010-06-05 DIAGNOSIS — G629 Polyneuropathy, unspecified: Secondary | ICD-10-CM

## 2010-06-05 DIAGNOSIS — R739 Hyperglycemia, unspecified: Secondary | ICD-10-CM

## 2010-06-05 DIAGNOSIS — G589 Mononeuropathy, unspecified: Secondary | ICD-10-CM

## 2010-06-05 DIAGNOSIS — I10 Essential (primary) hypertension: Secondary | ICD-10-CM

## 2010-06-05 HISTORY — DX: Chronic inflammatory demyelinating polyneuritis: G61.81

## 2010-06-05 NOTE — Assessment & Plan Note (Signed)
On steroids, per [facility].  Gradual changes noted by patient.  D/w pt about foot inspection and fu should he notice skin breakdown.

## 2010-06-05 NOTE — Assessment & Plan Note (Signed)
Normal PSA.  PSA options were discussed along with recent recs.  No indication for psa next year, since patient is low risk and there is no FH of prosate CA.  He understood recent changes in guidelines/testing of PSA.

## 2010-06-05 NOTE — Progress Notes (Signed)
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  Using a cane now. Trouble with steps.  A few falls, more likely if not careful with full ext of his legs.  Single story home, 3 steps to get in.  He has a rail for the steps.  Has fu at [facility].  He needs a parking sticker form done; this was completed and given to patient.  Hypertension:    Using medication without problems or lightheadedness: yes Chest pain with exertion:no Edema:no Short of breath:no Average home BPs: ~120-130/80-90.    Hyperglycemia.  On prednisone for myositis.  Labs d/w pt.  Feeling well o/w.  He is working on [employer] and exercising as tolerated.   Prostate cancer screening.  D/w pt.  See below.   Colon cancer screening. D/w pt.  See below.   PMH and SH reviewed  ROS: See HPI, otherwise noncontributory.  Meds, vitals, and allergies reviewed.   GEN: nad, alert and oriented HEENT: mucous membranes moist NECK: supple w/o LA CV: rrr. PULM: ctab, no inc wob ABD: soft, +bs EXT: no edema, but dec in muscle mass noted, esp in the quads SKIN: no acute rash Prostate gland firm and smooth, no enlargement, nodularity, tenderness, mass, asymmetry or induration.  Diabetic foot exam: Normal inspection No skin breakdown, but dry skin noted Normal DP pulses Dec sensation to light tough and monofilament bilaterally Nails brittle

## 2010-06-05 NOTE — Assessment & Plan Note (Signed)
Continue current meds.  Avoid statin for now given his history.  Per BTD.  Last OV note from [facility] reviewed.

## 2010-06-05 NOTE — Assessment & Plan Note (Signed)
A1c at goal.  Continue diet/exercise as tolerated.  Recheck 6 months.

## 2010-06-05 NOTE — Patient Instructions (Addendum)
Check with your insurance to see if they will cover the shingles shot and ask the docs at [facility] about getting it.   I would get a flu shot each fall.   Keep working on your diet and try to exercise as tolerated. Schedule a follow up appointment in 6 months for labs and then a visit with me after that.   Take care.  Glad to see you today.

## 2010-06-05 NOTE — Assessment & Plan Note (Signed)
D/w patient QQ:[id number] for colon cancer screening, including IFOB vs. colonoscopy.  Not due for repeat colonoscopy.  Pt elects for IFOB.

## 2010-06-05 NOTE — Assessment & Plan Note (Signed)
No change in meds.  Controlled.  Okay for outpatient fu.  Labs d/w pt.  He'll check BP episodically.

## 2010-06-20 NOTE — Op Note (Signed)
NAME:  JOSIAH, NIETO NO.:  [id number]   MEDICAL RECORD NO.:  [id number]          PATIENT TYPE:  INP   LOCATION:  1507                         FACILITY:  [facility]   PHYSICIAN:  Thornton Park. Daphine Deutscher, MD  DATE OF BIRTH:  [birth year]   DATE OF PROCEDURE:  06/17/2004  DATE OF DISCHARGE:                                 OPERATIVE REPORT   PREOPERATIVE DIAGNOSES:  Bilateral inguinal hernias.   POSTOPERATIVE DIAGNOSES:  Bilateral indirect hernias.   PROCEDURE:  Laparoscopic bilateral inguinal hernia repair using bard 3D max  mesh.   SURGEON:  Thornton Park. Daphine Deutscher, MD.   ANESTHESIA:  General.   DESCRIPTION OF PROCEDURE:  Mr. Phillips was taken to room one on the evening  of Jun 17, 2004 and given general anesthesia. The abdomen was prepped with  Betadine after shaving a limited area and clipping this. A transverse  incision was made slightly to the left of the umbilicus and I entered the  preperitoneal area bluntly and then inflated the dissection balloon under  zero degree laparoscopic visualization. This had a good deployment. He had a  lot of fat in his preperitoneal space despite that fact that he is a fairly  thin man. I then inserted the balloon tip trocar and insufflated the  preperitoneal space. I placed two 5 mm trocars under laparoscopic direct  vision. I then used those to dissect first the right side. There I found a  very large herniated lipomatous mass on the cord structures which I reduced  and brought out. There was no indirect sac that I could find affiliated with  that. Likewise on the left side, I found the same thing, a very large  herniated properitoneal fat sac. When I reduced both, it resulted in some  pneumatosis of the scrotum. I checked both direct floors and did not see any  direct hernias. I then installed panels of 3D max mesh, placing on the left  first anchoring it to Cooper's ligament and to the midline and up. I did not  place tags laterally  because it appeared it might interfere and irritate the  lateral femoral cutaneous nerve. I did the same on the right side  introducing a piece of 3D max mesh and orienting it as well. Both were large  pieces of mesh that seemed to cover the floor adequately. I then watched as  I deflated and these stayed in good position. The fascial defect in the  upper midline was closed with #0 Vicryl and then all were closed with 4-0  Vicryl, benzoin and Steri-  Strips. The patient seemed to tolerate the procedure well and was taken to  the recovery room. Because his case had been delayed and it was actually  started after 5 o'clock, I went ahead and will keep him for overnight  observation. He does live in [place] and that would be a reason to keep  him for 12 hours for observation.      MBM/MEDQ  D:  06/18/2004  T:  06/18/2004  Job:  [id number]   cc:   Laurita Quint, M.D.  [address] [place]  [place] [address]  Fax: [phone]

## 2010-06-20 NOTE — Procedures (Signed)
[facility]  Patient:    [name], [name]                      MRN: [id number] Proc. Date: 10/28/99 Adm. Date:  [id number] Disc. Date: [id number] Attending:  Katha Cabal CC:         Thornton Park. Daphine Deutscher, M.D.  Robbi Garter, M.D.   Procedure Report  PROCEDURE:  Endoscopic retrograde cholangiopancreatogram with sphincterotomy incision and balloon stone extractions.  ENDOSCOPIST:  Venita Lick. Pleas Koch., M.D.  REFERRING PHYSICIAN:  Thornton Park. Daphine Deutscher, M.D.  INDICATIONS:  This is a 70 year old white male with a long history of symptomatic cholelithiasis who underwent laparoscopic cholecystectomy by Dr. Luretha Murphy on October 27, 1999.  An intraoperative cholangiogram revealed three distal common bile duct stones.  PHYSICAL EXAMINATION:  Chest:  Clear to auscultation and percussion.  Cardiac: Regular rate and rhythm without murmurs.  Neurologic:  Alert and oriented x3.  ANESTHESIA:  Fentanyl 75 mcg IV, Versed 9 mg IV, Glucagon 0.5 mg IV, and pharyngeal Cetacaine spray.  MONITORING:  Automated blood pressure monitor, pulse oximeter and cardiac monitor.  Low-flow oxygen was given by nasal cannula throughout the procedure. The procedure was well tolerated with no immediate complications.  DESCRIPTION OF PROCEDURE:  After the nature of the procedure was discussed with the patient including discussion of its risks, benefits and alternatives, he consented to proceed.  He understood the risks of pancreatitis, bleeding, perforation, infection and sedation-related complications.  He was  brought to the fluoroscopy suite and comfortably sedated in the prone position.  The Olympus video duodenoscope was inserted in the posterior pharynx and the esophagus was intubated blindly.  The esophagus and stomach were incompletely examined with the side viewing instrument, however, no obvious pathology was noted on the incomplete exam.  The duodenal bulb and  descending duodenum appeared normal.  The major antral appeared normal.  The common bile duct was easily and freely cannulated.  The pancreatic duct was not cannulated or injected with contrast by intention.  Filling of the biliary tree revealed a borderline dilated common bile duct, post cholecystectomy cholangiogram and normal intrahepatic ducts.  There were three filling defects in the distal common bile duct consistent with stones measuring between 5 and 7 mm each, an 8 mm sphincterotomy incision was then performed without difficulty over a guide wire.  A balloon catheter was inserted to the common hepatic duct, contrast injected and a more detailed intrahepatic cholangiogram was obtained, that appeared entirely normal.  Three balloon pull through were performed with complete clearance of all the common bile duct stones.  The final cholangiogram was completely free of filling defects.  There was excellent drainage of the biliary tree through the sphincterotomy incision.  No bile duct leak was noted.  Video photographs were obtained.  Multiple radiographs were obtained.  The duodenum and stomach were decompressed and the endoscope was withdrawn from the patient.  IMPRESSION: 1. Post cholecystectomy cholangiogram. 2. Borderline dilated common bile duct. 3. Three common bile duct stones. 4. Sphincterotomy incision performed. 5. Balloon stone extraction.  RECOMMENDATIONS: 1. Standard post ERCP and sphincterotomy observation. 2. No aspirin or NSAID products for two weeks. DD:  10/28/99 TD:  10/28/99 Job: [id number]

## 2010-06-20 NOTE — Assessment & Plan Note (Signed)
[facility]                           GASTROENTEROLOGY OFFICE NOTE   JUANANGEL, SODERHOLM                      MRN:          [id number]  DATE:12/08/2005                            DOB:          10/23/1940    REASON FOR REFERRAL:  GERD and dysphagia.   HISTORY OF PRESENT ILLNESS:  Mr. [name] is a very nice 70 year old white  male whom I have evaluated in the past.  He underwent ERCP with  sphincterotomy and removal of 3 common bile duct stones in September of  2001.  He is now referred back to see me by Dr. Hetty Ely for problems with  heartburn, dysphagia to solid foods and pills, belching, lump in the throat  sensation, gas, and bloating.  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  For about the past 2 months, his  gastrointestinal symptoms have started.  He has no prior history of  gastrointestinal problems except for his biliary problems in 2001.  He was  started on Prilosec 20 mg OTC 20 mg b.i.d. by Dr. Hetty Ely about 2-and-a-  half weeks ago, and he has had a substantial improvement in his symptoms,  but not a complete resolution.  He notes no odynophagia, melena,  hematochezia, change in bowel habits, nausea or vomiting.   PAST MEDICAL HISTORY:  Hypertension.  Kidney stones.  Status post  cholecystectomy.  Status post ERCP with sphincterotomy.  Status post hernia  repair in 2006.  CIDP (chronic inflammatory demyelinating polyneuropathy).   CURRENT MEDICATIONS:  Listed on the chart, updated, and reviewed.   MEDICATION ALLERGIES:  SULFA DRUGS.   SOCIAL HISTORY AND REVIEW OF SYSTEMS:  As listed on the handwritten form.   PHYSICAL EXAM:  Well-developed, well-nourished white male in no acute  distress.  Height 6 feet 6 inches.  Weight 224 pounds.  Blood pressure 122/84.  Pulse  84 and regular.  HEENT:  Anicteric sclerae.   Oropharynx clear.  CHEST:  Clear to auscultation bilaterally.  CARDIAC:  Regular rate and rhythm without murmurs appreciated.  ABDOMEN:  Soft.  Nontender.  Nondistended.  Normoactive bowel sounds.  No  palpable organomegaly, masses, or hernias.  RECTAL:  Deferred.  EXTREMITIES:  Without cyanosis, clubbing, or edema.  NEUROLOGIC:  Alert and oriented x3.  Grossly nonfocal.   ASSESSMENT AND PLAN:  Presumed gastroesophageal reflux disease.  Rule out  acid-induced esophagitis, infectious esophagitis, strictures, and ulcer  disease.  Continue Prilosec 20 mg over-the-counter or omeprazole 20 mg p.o.  b.i.d. taken before breakfast and dinner.  He is given instructions on all  anti-reflux measures.  Risks, benefits, and alternatives to upper  endoscopy with possible biopsy and possible dilation discussed with the  patient, and he consents to proceed.  This will be scheduled electively.     Venita Lick. Russella Dar, MD, [facility]  Electronically Signed    MTS/MedQ  DD: 12/08/2005  DT: 12/08/2005  Job #: [id number]   cc:   Arta Silence, MD

## 2010-06-20 NOTE — Op Note (Signed)
[facility]  Patient:    [name], [name]                      MRN: [id number] Proc. Date: 10/27/99 Adm. Date:  [id number] Attending:  Katha Cabal CC:         Laurita Quint, M.D.  [facility]   Operative Report  PREOPERATIVE DIAGNOSIS:  Gallstones.  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  OPERATION:  Laparoscopic cholecystectomy, intraoperative cholangiogram.  SURGEON:  Thornton Park. Daphine Deutscher, M.D.  ASSISTANT:  Catalina Lunger, M.D.  ANESTHESIA:  INDICATIONS: Mr. 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  He had an ultrasound which showed a contracting gallbladder with multiple gallstones.  Informed consent was obtained this procedure.  DESCRIPTION OF PROCEDURE:  The patient was taken to OR2 on October 27, 1999, and given general anesthesia.  The abdomen was prepped with Betadine and draped sterilely.  I went through his umbilicus into a small umbilical hernia and inserted the Hasson cannula.  The abdomen was insufflated, and three other ports were placed.  The gallbladder had sort of a phrygian cap and was somewhat intrahepatic.  It was elevated, incised, and dissected to Calots triangle.  In doing this, I found a small cystic duct which seemed to go down for a distance into the common bile duct.  I dissected proximally on the gallbladder, clipped it, and then incised it and then put in the catheter.  I did a dynamic cholangiogram which showed filling of the distal common bile duct first with multiple filling defects.  I then subsequently got good complete filling of the common bile duct with intrahepatic filling and with filling to the duodenum, but these were persistent and appeared to be multifaceted stones lodged in the distal common bile duct.  The pancreatic duct was visualized  as well.  The patient had these captured and printed.  I then triple clipped the cystic duct, divided, triple clipped the cystic artery, divided it, removed the gallbladder.  I entered it in the intrahepatic portion, and we had some stones that I was able to retrieve.  I then detached the gallbladder, placed it in the gallbladder bag along with the big stones and removed it to the umbilicus.  I went in and irrigated the gallbladder bed.  No bleeding or bowel leaks were seen.  The umbilical port was repaired with three sutures of 0 Vicryl.  I went back in and looked at the gallbladder bed again, and it looked dry.  Ports had previously been injected with 0.5% Marcaine.  The wounds were closed with 4-0 Vicryl, Benzoin and Steri-Strips. The patient tolerated the procedure well and was taken to the recovery room in satisfactory condition. DD:  10/27/99 TD:  10/28/99 Job: [id number]

## 2010-06-25 ENCOUNTER — Other Ambulatory Visit: Payer: Medicare Other

## 2010-06-25 ENCOUNTER — Other Ambulatory Visit (INDEPENDENT_AMBULATORY_CARE_PROVIDER_SITE_OTHER): Payer: Medicare Other | Admitting: Family Medicine

## 2010-06-25 ENCOUNTER — Encounter: Payer: Self-pay | Admitting: *Deleted

## 2010-06-25 DIAGNOSIS — Z1211 Encounter for screening for malignant neoplasm of colon: Secondary | ICD-10-CM

## 2010-11-11 ENCOUNTER — Ambulatory Visit: Payer: Medicare Other | Admitting: Family Medicine

## 2010-11-11 ENCOUNTER — Encounter: Payer: Self-pay | Admitting: Family Medicine

## 2010-11-11 ENCOUNTER — Ambulatory Visit (INDEPENDENT_AMBULATORY_CARE_PROVIDER_SITE_OTHER): Payer: Medicare Other | Admitting: Family Medicine

## 2010-11-11 ENCOUNTER — Other Ambulatory Visit: Payer: Self-pay | Admitting: Family Medicine

## 2010-11-11 VITALS — BP 126/86 | HR 76 | Temp 98.3°F | Wt 230.1 lb

## 2010-11-11 DIAGNOSIS — R931 Abnormal findings on diagnostic imaging of heart and coronary circulation: Secondary | ICD-10-CM

## 2010-11-11 DIAGNOSIS — R739 Hyperglycemia, unspecified: Secondary | ICD-10-CM

## 2010-11-11 DIAGNOSIS — I10 Essential (primary) hypertension: Secondary | ICD-10-CM

## 2010-11-11 DIAGNOSIS — G729 Myopathy, unspecified: Secondary | ICD-10-CM

## 2010-11-11 DIAGNOSIS — Z5181 Encounter for therapeutic drug level monitoring: Secondary | ICD-10-CM

## 2010-11-11 DIAGNOSIS — R609 Edema, unspecified: Secondary | ICD-10-CM

## 2010-11-11 DIAGNOSIS — S81009A Unspecified open wound, unspecified knee, initial encounter: Secondary | ICD-10-CM

## 2010-11-11 DIAGNOSIS — R7309 Other abnormal glucose: Secondary | ICD-10-CM

## 2010-11-11 DIAGNOSIS — S81801A Unspecified open wound, right lower leg, initial encounter: Secondary | ICD-10-CM

## 2010-11-11 DIAGNOSIS — Z7901 Long term (current) use of anticoagulants: Secondary | ICD-10-CM

## 2010-11-11 DIAGNOSIS — Z23 Encounter for immunization: Secondary | ICD-10-CM

## 2010-11-11 LAB — HEMOGLOBIN A1C: Mean Plasma Glucose: 131 mg/dL — ABNORMAL HIGH (ref <117)

## 2010-11-11 MED ORDER — MUPIROCIN 2 % EX OINT: TOPICAL_OINTMENT | Freq: Two times a day (BID) | CUTANEOUS | Status: AC

## 2010-11-11 NOTE — Progress Notes (Signed)
R lateral shin, hit it 2 weeks ago. Not healed in meantime.  Was initially cleaned and the skin flap was placed back over the wound.  Not ttp but with dec in sensation on legs.  Chronically on prednisone.  BLE edema.  Meds, vitals, and allergies reviewed.   ROS: See HPI.  Otherwise, noncontributory.  nad 2x1cm with flap aderent on R shin.  The flap appears darker, but no pus or discharge from the site.  No spreading erythema.   1-2+ BLE edema.   Distally with good cap refill

## 2010-11-11 NOTE — Patient Instructions (Signed)
Keep using the telfa pads but use bactroban instead of neosporin.  Get a TED hose and then use that during the day to help with the swelling.  Let me know if you have pus/redness in the area.

## 2010-11-13 DIAGNOSIS — S81801A Unspecified open wound, right lower leg, initial encounter: Secondary | ICD-10-CM | POA: Insufficient documentation

## 2010-11-13 DIAGNOSIS — S81809A Unspecified open wound, unspecified lower leg, initial encounter: Secondary | ICD-10-CM | POA: Insufficient documentation

## 2010-11-13 NOTE — Assessment & Plan Note (Signed)
Appears superficial and will likely slowly heal.  Continue topical tx.  Use bactroban and get ted hose to dec edema.  See notes on labs given edema and prednisone.  He may end up losing the flap, but it is protective in the meantime.  D/w pt.  If any pus or spreading erythema, then he'll need to be seen again. He agrees. Okay for outpatient f/u.

## 2010-12-04 ENCOUNTER — Other Ambulatory Visit (INDEPENDENT_AMBULATORY_CARE_PROVIDER_SITE_OTHER): Payer: Medicare Other

## 2010-12-04 DIAGNOSIS — T50905A Adverse effect of unspecified drugs, medicaments and biological substances, initial encounter: Secondary | ICD-10-CM

## 2010-12-04 DIAGNOSIS — R7309 Other abnormal glucose: Secondary | ICD-10-CM

## 2010-12-08 ENCOUNTER — Ambulatory Visit (INDEPENDENT_AMBULATORY_CARE_PROVIDER_SITE_OTHER): Payer: Medicare Other | Admitting: Family Medicine

## 2010-12-08 ENCOUNTER — Encounter: Payer: Self-pay | Admitting: Family Medicine

## 2010-12-08 DIAGNOSIS — R7309 Other abnormal glucose: Secondary | ICD-10-CM

## 2010-12-08 DIAGNOSIS — S91009A Unspecified open wound, unspecified ankle, initial encounter: Secondary | ICD-10-CM

## 2010-12-08 DIAGNOSIS — G629 Polyneuropathy, unspecified: Secondary | ICD-10-CM

## 2010-12-08 DIAGNOSIS — Z125 Encounter for screening for malignant neoplasm of prostate: Secondary | ICD-10-CM

## 2010-12-08 DIAGNOSIS — S81801A Unspecified open wound, right lower leg, initial encounter: Secondary | ICD-10-CM

## 2010-12-08 DIAGNOSIS — R739 Hyperglycemia, unspecified: Secondary | ICD-10-CM

## 2010-12-08 DIAGNOSIS — G589 Mononeuropathy, unspecified: Secondary | ICD-10-CM

## 2010-12-08 DIAGNOSIS — I10 Essential (primary) hypertension: Secondary | ICD-10-CM

## 2010-12-08 DIAGNOSIS — T50905A Adverse effect of unspecified drugs, medicaments and biological substances, initial encounter: Secondary | ICD-10-CM

## 2010-12-08 NOTE — Patient Instructions (Signed)
Don't change your meds.   Recheck labs ahead of physical in 6 months.   Take care.  Glad to see you.

## 2010-12-08 NOTE — Progress Notes (Signed)
R lower shin wound, healing, no discharge.  The flap stayed on.  Now with 0.5x1.5cm scab.  Trace edema.  He didn't have to use compression stockings but he was wearing tighter tube socks with some effect.    Hyperglycemia on chronic steroids.  A1c is okay and d/w pt, 6.1 on last check.  Hadn't been checking sugar.  He's working on low sugar diet.  No sx of hyperglycemia/hypoglycemia.  He's trying to stay as active at possible.    Hypertension:    Using medication without problems or lightheadedness: yes Chest pain with exertion:no Edema: mild, 1+ BLE edema Short of breath:no Average home BPs: 120s- low 130s/80s per patient  Meds, vitals, and allergies reviewed.   PMH and SH reviewed  ROS: See HPI.  Otherwise negative.    GEN: nad, alert and oriented HEENT: mucous membranes moist NECK: supple w/o LA CV: rrr. PULM: ctab, no inc wob ABD: soft, +bs EXT: no edema SKIN: no acute rash, now with healing 0.5x1.5cm scab on R shin.  Trace edema.

## 2010-12-09 ENCOUNTER — Encounter: Payer: Self-pay | Admitting: Family Medicine

## 2010-12-09 NOTE — Assessment & Plan Note (Signed)
Continue as is, no change in meds.

## 2010-12-09 NOTE — Assessment & Plan Note (Addendum)
A1c reviewed, d/w pt about diet.  Continue to stay as active as possible. >25 min spent with face to face with patient, >50% counseling.

## 2010-12-09 NOTE — Assessment & Plan Note (Signed)
Continue steroids per neuro at [facility].

## 2010-12-09 NOTE — Assessment & Plan Note (Signed)
Healing slowly.  

## 2011-03-02 ENCOUNTER — Ambulatory Visit (INDEPENDENT_AMBULATORY_CARE_PROVIDER_SITE_OTHER): Payer: Medicare Other | Admitting: Family Medicine

## 2011-03-02 ENCOUNTER — Encounter: Payer: Self-pay | Admitting: Family Medicine

## 2011-03-02 DIAGNOSIS — J40 Bronchitis, not specified as acute or chronic: Secondary | ICD-10-CM

## 2011-03-02 MED ORDER — AZITHROMYCIN 250 MG PO TABS: ORAL_TABLET | ORAL | Status: AC

## 2011-03-02 NOTE — Progress Notes (Signed)
Cough started Wednesday.  Clear rhinorrhea.  Then discolored discharge over the weekend.  Cough is painful, green sputum.  R facial pain and R ear pain.  No fevers.  On prednisone.  Had a flu shot.  Taking mucinex.   Meds, vitals, and allergies reviewed.   ROS: See HPI.  Otherwise, noncontributory.  GEN: nad, alert and oriented HEENT: mucous membranes moist, tm w/o erythema, nasal exam w/o erythema, clear discharge noted,  OP with cobblestoning NECK: supple w/o LA CV: rrr.   PULM: No inc wob, but noted exp ronchi B.  No wheeze, no rales.   EXT: no edema SKIN: no acute rash

## 2011-03-02 NOTE — Patient Instructions (Signed)
Drink plenty of fluids, take tylenol as needed, and start the antibiotics.  This should gradually improve.  Take care.  Let us know if you have other concerns.

## 2011-03-03 ENCOUNTER — Encounter: Payer: Self-pay | Admitting: Family Medicine

## 2011-03-03 DIAGNOSIS — J40 Bronchitis, not specified as acute or chronic: Secondary | ICD-10-CM | POA: Insufficient documentation

## 2011-03-03 NOTE — Assessment & Plan Note (Signed)
Given his muscle status and pred use, I would start the abx today.  He agrees. Nontoxic, okay for outpatient f/u.  Call back as needed.

## 2011-04-24 ENCOUNTER — Encounter: Payer: Self-pay | Admitting: Family Medicine

## 2011-04-27 ENCOUNTER — Encounter: Payer: Self-pay | Admitting: *Deleted

## 2011-05-16 ENCOUNTER — Ambulatory Visit: Payer: Self-pay | Admitting: Internal Medicine

## 2011-05-18 ENCOUNTER — Telehealth: Payer: Self-pay | Admitting: Family Medicine

## 2011-05-18 ENCOUNTER — Encounter: Payer: Self-pay | Admitting: Family Medicine

## 2011-05-18 ENCOUNTER — Ambulatory Visit (INDEPENDENT_AMBULATORY_CARE_PROVIDER_SITE_OTHER): Payer: Medicare Other | Admitting: Family Medicine

## 2011-05-18 ENCOUNTER — Inpatient Hospital Stay (HOSPITAL_COMMUNITY): Payer: Medicare Other

## 2011-05-18 ENCOUNTER — Encounter (HOSPITAL_COMMUNITY): Payer: Self-pay

## 2011-05-18 ENCOUNTER — Inpatient Hospital Stay (HOSPITAL_COMMUNITY)
Admission: AD | Admit: 2011-05-18 | Discharge: 2011-05-21 | DRG: 603 | Disposition: A | Discharge status: Home / Self Care | Payer: Medicare Other | Source: Ambulatory Visit | Attending: Internal Medicine | Admitting: Internal Medicine

## 2011-05-18 VITALS — BP 110/60 | HR 88 | Temp 98.1°F

## 2011-05-18 DIAGNOSIS — G7241 Inclusion body myositis [IBM]: Secondary | ICD-10-CM

## 2011-05-18 DIAGNOSIS — R011 Cardiac murmur, unspecified: Secondary | ICD-10-CM

## 2011-05-18 DIAGNOSIS — I10 Essential (primary) hypertension: Secondary | ICD-10-CM | POA: Diagnosis present

## 2011-05-18 DIAGNOSIS — Z79899 Other long term (current) drug therapy: Secondary | ICD-10-CM

## 2011-05-18 DIAGNOSIS — Z7982 Long term (current) use of aspirin: Secondary | ICD-10-CM

## 2011-05-18 DIAGNOSIS — Z1211 Encounter for screening for malignant neoplasm of colon: Secondary | ICD-10-CM

## 2011-05-18 DIAGNOSIS — R739 Hyperglycemia, unspecified: Secondary | ICD-10-CM

## 2011-05-18 DIAGNOSIS — L03119 Cellulitis of unspecified part of limb: Secondary | ICD-10-CM | POA: Diagnosis present

## 2011-05-18 DIAGNOSIS — J40 Bronchitis, not specified as acute or chronic: Secondary | ICD-10-CM

## 2011-05-18 DIAGNOSIS — Z882 Allergy status to sulfonamides status: Secondary | ICD-10-CM

## 2011-05-18 DIAGNOSIS — G6181 Chronic inflammatory demyelinating polyneuritis: Secondary | ICD-10-CM | POA: Diagnosis present

## 2011-05-18 DIAGNOSIS — B952 Enterococcus as the cause of diseases classified elsewhere: Secondary | ICD-10-CM | POA: Diagnosis present

## 2011-05-18 DIAGNOSIS — B9689 Other specified bacterial agents as the cause of diseases classified elsewhere: Secondary | ICD-10-CM | POA: Diagnosis present

## 2011-05-18 DIAGNOSIS — L02419 Cutaneous abscess of limb, unspecified: Principal | ICD-10-CM | POA: Diagnosis present

## 2011-05-18 DIAGNOSIS — R55 Syncope and collapse: Secondary | ICD-10-CM

## 2011-05-18 DIAGNOSIS — Z125 Encounter for screening for malignant neoplasm of prostate: Secondary | ICD-10-CM

## 2011-05-18 DIAGNOSIS — Z85828 Personal history of other malignant neoplasm of skin: Secondary | ICD-10-CM

## 2011-05-18 DIAGNOSIS — Z7952 Long term (current) use of systemic steroids: Secondary | ICD-10-CM

## 2011-05-18 DIAGNOSIS — M609 Myositis, unspecified: Secondary | ICD-10-CM | POA: Diagnosis present

## 2011-05-18 DIAGNOSIS — IMO0002 Reserved for concepts with insufficient information to code with codable children: Secondary | ICD-10-CM

## 2011-05-18 DIAGNOSIS — T50905A Adverse effect of unspecified drugs, medicaments and biological substances, initial encounter: Secondary | ICD-10-CM

## 2011-05-18 LAB — DIFFERENTIAL
Basophils Relative: 0 % (ref 0–1)
Eosinophils Absolute: 0 10*3/uL (ref 0.0–0.7)
Eosinophils Relative: 0 % (ref 0–5)
Monocytes Absolute: 0.4 10*3/uL (ref 0.1–1.0)
Monocytes Relative: 4 % (ref 3–12)

## 2011-05-18 LAB — CBC
HCT: 43.6 % (ref 39.0–52.0)
Platelets: 227 10*3/uL (ref 150–400)
RDW: 13.7 % (ref 11.5–15.5)
WBC: 11.3 10*3/uL — ABNORMAL HIGH (ref 4.0–10.5)

## 2011-05-18 LAB — COMPREHENSIVE METABOLIC PANEL
ALT: 51 U/L (ref 0–53)
AST: 46 U/L — ABNORMAL HIGH (ref 0–37)
Albumin: 3.3 g/dL — ABNORMAL LOW (ref 3.5–5.2)
Alkaline Phosphatase: 80 U/L (ref 39–117)
BUN: 13 mg/dL (ref 6–23)
Chloride: 101 mEq/L (ref 96–112)
Potassium: 3.6 mEq/L (ref 3.5–5.1)
Sodium: 137 mEq/L (ref 135–145)
Total Bilirubin: 0.8 mg/dL (ref 0.3–1.2)

## 2011-05-18 MED ORDER — ONDANSETRON HCL 4 MG PO TABS: 4.0000 mg | ORAL_TABLET | Freq: Four times a day (QID) | ORAL | Status: DC | PRN

## 2011-05-18 MED ORDER — HYDROMORPHONE HCL PF 1 MG/ML IJ SOLN: 0.5000 mg | INTRAMUSCULAR | Status: DC | PRN

## 2011-05-18 MED ORDER — PIPERACILLIN-TAZOBACTAM 3.375 G IVPB
3.3750 g | Freq: Three times a day (TID) | INTRAVENOUS | Status: DC
  Administered 2011-05-18 – 2011-05-20 (×6): 3.375 g via INTRAVENOUS
  Filled 2011-05-18 (×9): qty 50

## 2011-05-18 MED ORDER — TIMOLOL MALEATE 0.5 % OP SOLN
1.0000 [drp] | Freq: Every day | OPHTHALMIC | Status: DC
  Administered 2011-05-18 – 2011-05-20 (×3): 1 [drp] via OPHTHALMIC
  Filled 2011-05-18: qty 5

## 2011-05-18 MED ORDER — VANCOMYCIN HCL IN DEXTROSE 1-5 GM/200ML-% IV SOLN
1000.0000 mg | Freq: Two times a day (BID) | INTRAVENOUS | Status: DC
  Administered 2011-05-19 – 2011-05-20 (×3): 1000 mg via INTRAVENOUS
  Filled 2011-05-18 (×4): qty 200

## 2011-05-18 MED ORDER — CALCIUM CARBONATE 1250 (500 CA) MG PO TABS
3.0000 | ORAL_TABLET | Freq: Every day | ORAL | Status: DC
  Administered 2011-05-19: 1250 mg via ORAL
  Administered 2011-05-20 – 2011-05-21 (×2): 1500 mg via ORAL
  Filled 2011-05-18 (×4): qty 3

## 2011-05-18 MED ORDER — ONE-DAILY MULTI VITAMINS PO TABS: 1.0000 | ORAL_TABLET | Freq: Every day | ORAL | Status: DC

## 2011-05-18 MED ORDER — HYDROCORTISONE SOD SUCCINATE 100 MG IJ SOLR
50.0000 mg | Freq: Two times a day (BID) | INTRAMUSCULAR | Status: DC
  Administered 2011-05-18 – 2011-05-20 (×4): 50 mg via INTRAVENOUS
  Filled 2011-05-18 (×6): qty 1

## 2011-05-18 MED ORDER — GADOBENATE DIMEGLUMINE 529 MG/ML IV SOLN
20.0000 mL | Freq: Once | INTRAVENOUS | Status: AC | PRN
  Administered 2011-05-18: 20 mL via INTRAVENOUS

## 2011-05-18 MED ORDER — ACETAMINOPHEN 650 MG RE SUPP: 650.0000 mg | Freq: Four times a day (QID) | RECTAL | Status: DC | PRN

## 2011-05-18 MED ORDER — ONDANSETRON HCL 4 MG/2ML IJ SOLN: 4.0000 mg | Freq: Four times a day (QID) | INTRAMUSCULAR | Status: DC | PRN

## 2011-05-18 MED ORDER — VITAMIN B-12 1000 MCG PO TABS
1000.0000 ug | ORAL_TABLET | Freq: Every day | ORAL | Status: DC
  Administered 2011-05-19 – 2011-05-21 (×3): 1000 ug via ORAL
  Filled 2011-05-18 (×3): qty 1

## 2011-05-18 MED ORDER — VITAMIN B-12 500 MCG SL SUBL: 2.0000 | SUBLINGUAL_TABLET | Freq: Every day | SUBLINGUAL | Status: DC

## 2011-05-18 MED ORDER — ACETAMINOPHEN 325 MG PO TABS: 650.0000 mg | ORAL_TABLET | Freq: Four times a day (QID) | ORAL | Status: DC | PRN

## 2011-05-18 MED ORDER — ADULT MULTIVITAMIN W/MINERALS CH
1.0000 | ORAL_TABLET | Freq: Every day | ORAL | Status: DC
  Administered 2011-05-19 – 2011-05-21 (×3): 1 via ORAL
  Filled 2011-05-18 (×3): qty 1

## 2011-05-18 MED ORDER — SODIUM CHLORIDE 0.9 % IV SOLN
INTRAVENOUS | Status: DC
  Administered 2011-05-18: 1000 mL via INTRAVENOUS
  Administered 2011-05-18: 22:00:00 via INTRAVENOUS

## 2011-05-18 MED ORDER — CALCIUM 500 MG PO TABS: 1500.0000 mg | ORAL_TABLET | Freq: Every day | ORAL | Status: DC

## 2011-05-18 MED ORDER — ASPIRIN 325 MG PO TABS
325.0000 mg | ORAL_TABLET | Freq: Every day | ORAL | Status: DC
  Administered 2011-05-19 – 2011-05-21 (×3): 325 mg via ORAL
  Filled 2011-05-18 (×3): qty 1

## 2011-05-18 MED ORDER — LISINOPRIL 5 MG PO TABS
5.0000 mg | ORAL_TABLET | Freq: Every day | ORAL | Status: DC
  Administered 2011-05-19 – 2011-05-21 (×3): 5 mg via ORAL
  Filled 2011-05-18 (×3): qty 1

## 2011-05-18 MED ORDER — OXYCODONE HCL 5 MG PO TABS
5.0000 mg | ORAL_TABLET | Freq: Four times a day (QID) | ORAL | Status: DC
  Administered 2011-05-18 – 2011-05-20 (×6): 5 mg via ORAL
  Filled 2011-05-18 (×6): qty 1

## 2011-05-18 MED ORDER — VANCOMYCIN HCL 1000 MG IV SOLR
2000.0000 mg | Freq: Once | INTRAVENOUS | Status: AC
  Administered 2011-05-18: 2000 mg via INTRAVENOUS
  Filled 2011-05-18: qty 2000

## 2011-05-18 MED ORDER — HEPARIN SODIUM (PORCINE) 5000 UNIT/ML IJ SOLN
5000.0000 [IU] | Freq: Three times a day (TID) | INTRAMUSCULAR | Status: DC
  Administered 2011-05-18 – 2011-05-21 (×9): 5000 [IU] via SUBCUTANEOUS
  Filled 2011-05-18 (×11): qty 1

## 2011-05-18 NOTE — Telephone Encounter (Signed)
Caller: Carlynn Purl; PCP: Crawford Givens Clelia Croft); CB#: [phone]; Hx of Auto Immune Disorder, spreading Cellulitis Infection with redness from foot to behind knee. Has open wound near ankle.; Onset: 05/16/11. Temp not tested/diaphoretic.   Seen at [facility] 05/16/11; wound culture and xray of leg done.  Given IM antibiotic.  Declined triage d/t already has appt at 05/18/11 at 0945 with Dr Dallas Schimke.  Info noted and sent to MD per PCP Call Guideline.

## 2011-05-18 NOTE — Progress Notes (Signed)
Patient Name: JEN EPPINGER Date of Birth: 02/06/1940 Age: 71 y.o. Medical Record Number: [id number] Gender: male Date of Encounter: 05/18/2011  History of Present Illness:  ROREY BISSON is a 71 y.o. 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  Last Monday, lacerated a place on his leg, placed some antibiotic ointment on it. About 4 AM on Saturday morning, and was all swollen. Woke up swollen red, went to Urgent Care.   05/16/2011 - had a Rocephin 1 gram shot.  WBC 14.8 Doxycycline 100 mg po bid Worsening.   Today, swelling and redness, pain, significantly worsening. Normally able to walk with a cane, currently in a wheelchair. Minimally able to walk with great pain throughout the leg.  Patient Active Problem List  Diagnoses  . HYPERTENSION  . VASOVAGAL SYNCOPE  . SYSTOLIC MURMUR  . Inclusion body myositis (IBM)  . Chronic inflammatory demyelinating polyneuropathy  . Hyperglycemia, drug-induced  . Colon cancer screening  . Prostate cancer screening  . Bronchitis  . Cellulitis of leg   Past Medical History  Diagnosis Date  . Diverticulosis of colon 01/95  . Hypertension 08/07  . Polyneuropathy      Chronic inflammatory demylenating - Prednisone per [facility]  . Hyperglycemia     Related to chronic steroid use  . Nephrolithiasis 1993    X 2  . Cholelithiasis 01/95    via abdominal ultrasound  . Syncope 08/05/07    Presumed hypotension  . Cancer     skin cancer, resected by derm (Dr. Adolphus Birchwood)  . 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 Lumbar disc surgery 1988  . Lumbar disc surgery 2000    L3/4 rupture  . Nephrolithiasis   . Cholecystectomy 10/27/99    Daphine Deutscher)  . Doppler echocardiography 08/01    T.R.; T.R.; P/R. borderline LVH  . Hernia repair 06/17/04     Laparoscopic, bilateral  . Esophagogastroduodenoscopy 12/19/05    gastritis; [facility].   History  Substance Use Topics  . Smoking status: Never Smoker   . Smokeless tobacco: Not on file  . Alcohol Use: No   Family History  Problem Relation Age of Onset  . Hypertension Mother   . Heart disease Mother     MI  . Kidney disease Father     Kidney failure  . Cancer Sister     Acute leukemia  . Diabetes Neg Hx   . Stroke Neg Hx   . Prostate cancer Neg Hx   . Arthritis Sister    Allergies  Allergen Reactions  . Sulfonamide Derivatives     REACTION: questionable   Current Outpatient Prescriptions on File Prior to Visit  Medication Sig Dispense Refill  . aspirin 325 MG tablet Take 325 mg by mouth daily.        . Calcium Carbonate-Vitamin D (CALTRATE 600+D) 600-400 MG-UNIT per tablet Take 1 tablet by mouth 2 (two) times daily.        . Cyanocobalamin (VITAMIN B-12) 500 MCG SUBL Place 1 tablet under the tongue daily.        Marland Kitchen lisinopril (PRINIVIL,ZESTRIL) 5 MG tablet TAKE 1 TABLET BY MOUTH AT NIGHT  30 tablet  11  . Multiple Vitamin (MULTIVITAMIN) tablet Take 1 tablet by mouth daily.        . predniSONE (DELTASONE) 5 MG tablet Take 12.5 mg by mouth daily.        Marland Kitchen  timolol (TIMOPTIC) 0.5 % ophthalmic solution One drop in each eye at bedtime.          Past Medical History, Surgical History, Social History, Family History, Problem List, Medications, and Allergies have been reviewed and updated if relevant.  Review of Systems: As above, worsening redness, pain, swelling. AF this morning. Requiring pain medications  Physical Examination: Filed Vitals:   05/18/11 0948  BP: 110/60  Pulse: 88  Temp: 98.1 F (36.7 C)  TempSrc: Oral  SpO2: 96%    There is no height or weight on file to calculate BMI.   GEN: WDWN, NAD, Non-toxic, A & O x 3 HEENT: Atraumatic, Normocephalic. Neck supple. No masses, No LAD. Ears and Nose: No external deformity. CV: RRR, No M/G/R. No JVD. No thrill.  No extra heart sounds. PULM: CTA B, no wheezes, crackles, rhonchi. No retractions. No resp. distress. No accessory muscle use. EXTR: L lower leg, with open healing ulcer, 1 1/2 cm across. Diffuse redness and warmth throughout the Left lower extremity with 1+ LE edema NEURO Normal gait.  PSYCH: Normally interactive. Conversant. Not depressed or anxious appearing.  Calm demeanor.    Assessment and Plan:  1. Cellulitis of leg   2. Inclusion body myositis (IBM)   3. Chronic inflammatory demyelinating polyneuropathy    Cellulitis of LEFT lower extremity with treatment failure of initial treatment of Rocephin IM, doxycycline with significant deterioration in a patient with baseline immunocompromised state. Worsening of baseline function, now with minimal ability to ambulate.  Consulting [facility] to discuss admission.  Hannah Beat, MD 05/18/2011, 10:19 AM  I have consulted hospitalist service by pager at least 5 times with no response for 45 minutes. I think this patient needs to have care initiated. Will call ER MD and have wife take to ER for initiation of care.  Hannah Beat, MD 05/18/2011, 11:03 AM   Spoke with ER MD, will attempt to have hospitalist call our office. If no call in 15 minutes, will send to the ER.  Hannah Beat, MD 05/18/2011, 11:16 AM   Spoke to Dr. Sunnie Nielsen who will be the admitting MD to Team 4. Direct admit to regular bed.  Hannah Beat, MD 05/18/2011, 11:20 AM   Orders Today: No orders of the defined types were placed in this encounter.    Medications Today: Meds ordered this encounter  Medications  . HYDROcodone-acetaminophen (NORCO) 5-325 MG per tablet    Sig: Take 1 tablet by mouth every 6 (six) hours as needed.  . doxycycline (DORYX) 100 MG EC tablet    Sig: Take 100 mg by mouth 2 (two) times daily.  . mupirocin ointment (BACTROBAN) 2 %    Sig: Apply 1 application topically 3 (three) times daily.

## 2011-05-18 NOTE — Telephone Encounter (Signed)
Triage Record Num: [id number] Operator: Geanie Berlin Patient Name: [name] Call Date & Time: 05/18/2011 9:16:42AM Patient Phone: [phone] PCP: Crawford Givens Patient Gender: Male PCP Fax : Patient DOB: 19-Mar-1940 Practice Name: [facility] Reason for Call: Caller: Carlynn Purl; PCP: Crawford Givens Clelia Croft); CB#: [phone]; Hx of Auto Immune Disorder, spreading Cellulitis Infection with redness from foot to behind knee. Has open wound near ankle.; Onset: 05/16/11. Temp not tested/diaphoretic. Seen at [facility] 05/16/11; wound culture and xray of leg done. Given IM antibiotic. Declined triage d/t already has appt at 05/18/11 at 0945 with Dr Dallas Schimke. Info noted and sent to MD per PCP Call Guideline. Protocol(s) Used: PCP Calls, No Triage (Adult) Recommended Outcome per Protocol: Call Provider within 72 Hours Override Outcome if Used in Protocol: See Provider Immediately RN Reason for Override Outcome: Per Caller Request. Reason for Outcome: Caller requesting an appointment, triage offered and declined Care Advice: ~ 05/18/2011 9:42:17AM Page 1 of 1 [email or web address]

## 2011-05-18 NOTE — H&P (Signed)
PCP:   Crawford Givens, MD, MD   Chief Complaint:  Progressive swelling, pain and redness left leg, for one week.  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 Patient is a good historian, and history was supplemented by his family, who were present at the bedside. He ambulates with a cane, and exactly one week ago today, he scraped the anterior aspect of his left leg on a bottom stair, while walking around the staircase. He applied an Bactroban antibiotic ointment and bandaid dressing daily, and thought nothing moore of it. Everything seemed okay, till he went to bed in evening of 05/15/11. On waking in AM of 05/16/11, he had pain in the left leg , especially on weight-bearing, and noticed that the leg had become quite swollen and red to just below the knee. His family took him to [facility], where he was evaluated, given a single dose of IM Rocephin, then commenced on oral twice daily Doxycycline. He was compliant with these medications, but the appearance of his leg has become progressively worse, and he has been unable to ambulate without assistance, from pain. He called his PMD, was seen by DR Dallas Schimke at about 9:45 AM today, and referred for direct admission, for appropriate treatment.  Allergies:   Allergies  Allergen Reactions  . Sulfonamide Derivatives Other (See Comments)    Childhood reaction      Past Medical History  Diagnosis Date  . Diverticulosis of colon 01/95  . Hypertension 08/07  . Polyneuropathy      Chronic inflammatory demylenating - Prednisone per [facility]  . Hyperglycemia     Related to chronic steroid use  .  Nephrolithiasis 1993    X 2  . Cholelithiasis 01/95    via abdominal ultrasound  . Syncope 08/05/07    Presumed hypotension  . Cancer     skin cancer, resected by derm (Dr. Adolphus Birchwood)  . 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 Lumbar disc surgery 1988  . Lumbar disc surgery 2000    L3/4 rupture  . Nephrolithiasis   . Cholecystectomy 10/27/99    Daphine Deutscher)  . Doppler echocardiography 08/01    T.R.; T.R.; P/R. borderline LVH  . Hernia repair 06/17/04    Laparoscopic, bilateral  . Esophagogastroduodenoscopy 12/19/05    gastritis; [facility].    Prior to Admission medications   Medication Sig Start Date End Date Taking? Authorizing Provider  aspirin 325 MG tablet Take 325 mg by mouth daily.     Yes Historical Provider, MD  Calcium 500 MG tablet Take 1,500 mg by mouth daily.   Yes Historical Provider, MD  Cyanocobalamin (VITAMIN B-12) 500 MCG SUBL Place 2 tablets under the tongue daily.    Yes Historical Provider, MD  doxycycline (DORYX) 100 MG EC tablet Take 100 mg by mouth 2 (two) times daily.   Yes Historical Provider, MD  HYDROcodone-acetaminophen (NORCO) 5-325 MG per tablet Take 1 tablet by mouth every 6 (six) hours as needed. For pain   Yes Historical Provider, MD  lisinopril (PRINIVIL,ZESTRIL) 5 MG tablet Take 5 mg by mouth daily.   Yes Historical Provider, MD  Multiple Vitamin (MULTIVITAMIN) tablet Take 1 tablet by mouth daily.  Yes Historical Provider, MD  mupirocin ointment (BACTROBAN) 2 % Apply 1 application topically 3 (three) times daily.   Yes Historical Provider, MD  predniSONE (DELTASONE) 5 MG tablet Take 10 mg by mouth daily.    Yes Historical Provider, MD  timolol (TIMOPTIC) 0.5 % ophthalmic solution Place 1 drop into both eyes at bedtime. One drop in each eye at bedtime.   Yes Historical Provider, MD    Social History: Patient is now retired. He is married with 2 offspring. He reports that he has never  smoked. He does not have any smokeless tobacco history on file. He reports that he does not drink alcohol or use illicit drugs.  Family History  Problem Relation Age of Onset  . Hypertension Mother   . Heart disease Mother     MI  . Kidney disease Father     Kidney failure  . Cancer Sister     Acute leukemia  . Diabetes Neg Hx   . Stroke Neg Hx   . Prostate cancer Neg Hx   . Arthritis Sister     Review of Systems:  As per HPI and chief complaint. Patent denies fatigue, diminished appetite, weight loss, fever, chills, headache, blurred vision, difficulty in speaking, dysahagia, chest pain, cough, shortness of breath, orthopnea, paroxysmal nocturnal dyspnea, nausea, diaphoresis, abdominal pain, vomiting, diarrhea, belching, heartburn, hematemesis, melena, dysuria, nocturia, urinary frequency, hematochezia. The rest of the systems review is negative.  Physical Exam:  General:  Patient does not appear to be in obvious acute distress. Alert, communicative, fully oriented, talking in complete sentences, not short of breath at rest.  HEENT:  No clinical pallor, no jaundice, no conjunctival injection or discharge. NECK:  Supple, JVP not seen, no carotid bruits, no palpable lymphadenopathy, no palpable goiter. CHEST:  Clinically clear to auscultation, no wheezes, no crackles. HEART:  Sounds 1 and 2 heard, normal, regular, no murmurs. ABDOMEN:  Full, soft, no scars, non-tender, no palpable organomegaly, no palpable masses, normal bowel sounds. GENITALIA:  Not examined.. LOWER EXTREMITIES:  RLE No pitting edema, palpable peripheral pulses. LLE is swollen, erythematous from foot, up to just below knee, with a band of redness extending behind the knee and up into lower third of left thigh. The anterior shin wound is actually only about 1-2 cm in diameter,with mild purulence. No crepitus is palpable. MUSCULOSKELETAL SYSTEM:  Generalized osteoarthritic changes, otherwise, normal. CENTRAL NERVOUS  SYSTEM:  No focal neurologic deficit on gross examination.  Labs on Admission:  No results found for this or any previous visit (from the past 48 hour(s)).  Radiological Exams on Admission: No results found.  Assessment/Plan Principal Problem:  *Cellulitis and abscess of leg: Patient presents with progressive cellulitis of left leg due to an infected left shin wound, and has failed outpatient treatment. In view of rapid progression and immunocompromised status from chronic steroid therapy, he will ned intravenous antibiotics. We have therefore, commenced iv Vancomycin/Zosyn. Furthermore, we shall do MRI of the affected extremity, to rule out underlying myofasciitis. Wound culture and blood cultures will be sent off. Active Problems:  1. HTN (hypertension): BP is well controlled at present.  2. 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 He is on chronic steroid therapy.  3. Steroid long-term use: We shall place patient on stress dose of steroids, to cover acute illness.  Further management will depend on clinical course.  Time Spent on Admission: 45 mins.  Amarachi Kotz,CHRISTOPHER  05/18/2011, 3:24 PM

## 2011-05-18 NOTE — Telephone Encounter (Signed)
Seen today by Dr. Patsy Lager.

## 2011-05-18 NOTE — Progress Notes (Signed)
Utilization Review Completed.[name] T4/15/2013   

## 2011-05-18 NOTE — Progress Notes (Addendum)
ANTIBIOTIC CONSULT NOTE - INITIAL  Pharmacy Consult for Vancomycin/Zosyn Indication: LLE Cellulitis  Allergies  Allergen Reactions  . Sulfonamide Derivatives Other (See Comments)    Childhood reaction    Vital Signs: Temp: 98.1 F (36.7 C) (04/15 0948) Temp src: Oral (04/15 0948) BP: 110/60 mmHg (04/15 0948) Pulse Rate: 88  (04/15 0948) Intake/Output from previous day:   Intake/Output from this shift:    Labs: No results found for this basename: WBC:3,HGB:3,PLT:3,LABCREA:3,CREATININE:3 in the last 72 hours The CrCl is unknown because both a height and weight (above a minimum accepted value) are required for this calculation. No results found for this basename: VANCOTROUGH:2,VANCOPEAK:2,VANCORANDOM:2,GENTTROUGH:2,GENTPEAK:2,GENTRANDOM:2,TOBRATROUGH:2,TOBRAPEAK:2,TOBRARND:2,AMIKACINPEAK:2,AMIKACINTROU:2,AMIKACIN:2, in the last 72 hours   Microbiology: No results found for this or any previous visit (from the past 720 hour(s)).  Medical History: Past Medical History  Diagnosis Date  . Diverticulosis of colon 01/95  . Hypertension 08/07  . Polyneuropathy      Chronic inflammatory demylenating - Prednisone per [facility]  . Hyperglycemia     Related to chronic steroid use  . Nephrolithiasis 1993    X 2  . Cholelithiasis 01/95    via abdominal ultrasound  . Syncope 08/05/07    Presumed hypotension  . Cancer     skin cancer, resected by derm (Dr. Adolphus Birchwood)  . 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 aspirin  325 mg Oral Daily  . calcium carbonate  3 tablet Oral Q breakfast  . heparin  5,000 Units Subcutaneous Q8H  . hydrocortisone sod succinate (SOLU-CORTEF) injection  50 mg Intravenous Q12H  . lisinopril  5 mg Oral Daily  . mulitivitamin with minerals  1 tablet Oral Daily  . oxyCODONE  5 mg Oral Q6H  . timolol  1 drop Both Eyes QHS  . vitamin B-12  1,000 mcg Oral Daily  . DISCONTD: Calcium  1,500 mg Oral Daily  .  DISCONTD: multivitamin  1 tablet Oral Daily  . DISCONTD: Vitamin B-12  2 tablet Sublingual Daily   Assessment: [name] with left leg cellulitis and abscess, failed outpatient antibiotics treatment (rocephin IM x 1 and PO doxycycline), and rapidly progressing, concern for myofasciitis. Pharmacy is consulted to start vancomycin and zosyn for empiric coverage.  Wound and blood cultures, BMET and CBC are pending.   Goal of Therapy:  Vancomycin trough level 15-20 mcg/ml  Plan:  - Vancomycin loading dose 2000 mg IV x 1 - f/u renal function when labs are back for scheduled doses - zosyn 3.375g IV Q8hrs - f/u clinical course and cultures  Bayard Hugger, PharmD, BCPS, [phone] 05/18/2011,3:51 PM  Addendum: Scr = 1.0, est. crcl 87 ml/min, Vancomycin loading dose 2g scheduled at 2000 this evening.  Plan: - Vancomycin 1g IV Q 12 hrs after loading dose, start from 4/16 at 0800 - check vancomycin trough at steady state if indicated

## 2011-05-19 LAB — COMPREHENSIVE METABOLIC PANEL
Albumin: 2.9 g/dL — ABNORMAL LOW (ref 3.5–5.2)
BUN: 15 mg/dL (ref 6–23)
Creatinine, Ser: 1.09 mg/dL (ref 0.50–1.35)
GFR calc Af Amer: 77 mL/min — ABNORMAL LOW (ref >90)
Glucose, Bld: 139 mg/dL — ABNORMAL HIGH (ref 70–99)
Total Protein: 5.8 g/dL — ABNORMAL LOW (ref 6.0–8.3)

## 2011-05-19 LAB — CBC
HCT: 38.5 % — ABNORMAL LOW (ref 39.0–52.0)
MCV: 96 fL (ref 78.0–100.0)
Platelets: 211 10*3/uL (ref 150–400)
RBC: 4.01 MIL/uL — ABNORMAL LOW (ref 4.22–5.81)
WBC: 9.4 10*3/uL (ref 4.0–10.5)

## 2011-05-19 MED ORDER — SODIUM CHLORIDE 0.9 % IJ SOLN
10.0000 mL | Freq: Two times a day (BID) | INTRAMUSCULAR | Status: DC
  Administered 2011-05-19 – 2011-05-21 (×2): 10 mL via INTRAVENOUS

## 2011-05-19 NOTE — Progress Notes (Signed)
Subjective: Feels much better. Had a restful night.  Objective: Vital signs in last 24 hours: Temp:  [97.2 F (36.2 C)-99.5 F (37.5 C)] 98 F (36.7 C) (04/16 0800) Pulse Rate:  [63-82] 72  (04/16 0800) Resp:  [18-20] 18  (04/16 0800) BP: (113-138)/(74-87) 138/81 mmHg (04/16 0800) SpO2:  [93 %-97 %] 97 % (04/16 0800) Weight:  [102.059 kg (225 lb)] 102.059 kg (225 lb) (04/15 1500) Weight change:  Last BM Date: 05/17/11  Intake/Output from previous day: 04/15 0701 - 04/16 0700 In: 1240 [I.V.:590; IV Piggyback:650] Out: -  Total I/O In: 480 [P.O.:480] Out: -    Physical Exam: General: Patient does not appear to be in obvious acute distress. Alert, communicative, fully oriented, talking in complete sentences, not short of breath at rest.  HEENT: No clinical pallor, no jaundice, no conjunctival injection or discharge.  NECK: Supple, JVP not seen, no carotid bruits, no palpable lymphadenopathy, no palpable goiter.  CHEST: Clinically clear to auscultation, no wheezes, no crackles.  HEART: Sounds 1 and 2 heard, normal, regular, no murmurs.  ABDOMEN: Full, soft, no scars, non-tender, no palpable organomegaly, no palpable masses, normal bowel sounds.  GENITALIA: Not examined..  LOWER EXTREMITIES: RLE No pitting edema, palpable peripheral pulses. On 05/18/11, LLE was swollen, erythematous from foot, up to just below knee, with a band of redness extending behind the knee and up into lower third of left thigh. Today, there is only mid swelling, and erythema is now dramatically reduced, with none evident above the knee. The anterior shin wound is actually only about 1-2 cm in diameter, and looks cleaner. No crepitus is palpable.  MUSCULOSKELETAL SYSTEM: Generalized osteoarthritic changes, otherwise, normal.  CENTRAL NERVOUS SYSTEM: No focal neurologic deficit on gross examination.     Lab Results:  Basename 05/19/11 0518 05/18/11 1829  WBC 9.4 11.3*  HGB 13.2 14.6  HCT 38.5* 43.6  PLT  211 227    Basename 05/19/11 0518 05/18/11 1829  NA 139 137  K 4.3 3.6  CL 103 101  CO2 26 22  GLUCOSE 139* 187*  BUN 15 13  CREATININE 1.09 1.00  CALCIUM 8.6 9.1   Recent Results (from the past 240 hour(s))  WOUND CULTURE     Status: Normal (Preliminary result)   Collection Time   05/18/11  9:48 PM      Component Value Range Status Comment   Specimen Description WOUND LEG   Final    Special Requests NONE   Final    Gram Stain     Final    Value: NO WBC SEEN     NO SQUAMOUS EPITHELIAL CELLS SEEN     NO ORGANISMS SEEN   Culture NO GROWTH   Final    Report Status PENDING   Incomplete      Studies/Results: [name]  05/19/2011  *RADIOLOGY REPORT*  Clinical Data:  Cellulitis of the left lower leg.  MRI OF THE LEFT LOWER LEG WITH AND WITHOUT CONTRAST  Technique:  Multiplanar, multisequence MR imaging of the left lower leg was performed before and after the administration of intravenous contrast.  Contrast: 20mL MULTIHANCE GADOBENATE DIMEGLUMINE 529 MG/ML IV SOLN  Comparison:   None.  Findings: The patient has diffuse subcutaneous edema circumferentially in the left lower leg.  There is no osteomyelitis or abscess in the left lower leg.  The patient has bilateral fatty atrophy of the muscles of the lower legs particularly of the posterior compartment.  After contrast  administration the subcutaneous soft tissues enhance consistent with cellulitis.  There is no evidence of deep fasciitis.  IMPRESSION: Extensive cellulitis of the left lower leg.  No fasciitis, osteomyelitis, or abscesses.  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  Original Report Authenticated By: Gwynn Burly, M.D.    Medications: Scheduled Meds:   . aspirin  325 mg Oral Daily  . calcium carbonate  3 tablet Oral Q breakfast  . heparin  5,000 Units Subcutaneous Q8H  . hydrocortisone sod succinate (SOLU-CORTEF) injection  50 mg Intravenous Q12H  . lisinopril  5 mg Oral Daily    . mulitivitamin with minerals  1 tablet Oral Daily  . oxyCODONE  5 mg Oral Q6H  . piperacillin-tazobactam (ZOSYN)  IV  3.375 g Intravenous Q8H  . timolol  1 drop Both Eyes QHS  . vancomycin  2,000 mg Intravenous Once  . vancomycin  1,000 mg Intravenous Q12H  . vitamin B-12  1,000 mcg Oral Daily  . DISCONTD: Calcium  1,500 mg Oral Daily  . DISCONTD: multivitamin  1 tablet Oral Daily  . DISCONTD: Vitamin B-12  2 tablet Sublingual Daily   Continuous Infusions:   . sodium chloride 75 mL/hr at 05/18/11 2138   PRN Meds:.acetaminophen, acetaminophen, gadobenate dimeglumine, HYDROmorphone, ondansetron (ZOFRAN) IV, ondansetron  Assessment/Plan: Principal Problem:  *Cellulitis and abscess of leg: Patient presented with progressive cellulitis of left leg due to an infected left shin wound, and has failed outpatient treatment. In view of rapid progression and immunocompromised status from chronic steroid therapy, was commenced on iv Vancomycin/Zosyn, now day# 2. Clinical improvement has been dramatic, patient has had no pyrexia overnight, and wcc has normalized. Further improvement is anticipated. MRI LLE, done 05/18/11, showed extensive cellulitis of the left lower leg. No fasciitis, osteomyelitis, or abscesses. 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 Patient has been reassured accordingly. Wound and blood cultures have revealed no growth so far.  Active Problems:  1. HTN (hypertension): BP is well controlled at present.  2. 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 He is on chronic steroid therapy, and appears stable from this view point.  3. Steroid long-term use: Patient was commenced on stress dose of steroids, to cover acute illness.   Comment: Elevated LLE/WOC consult for wound care/Increase activity/PT evaluation. Have discontinued iv fluids. Anticipate  4-5 days of iv antibiotics, prior to discharge.   LOS: 1 day   [name],CHRISTOPHER 05/19/2011, 11:27 AM

## 2011-05-19 NOTE — Consult Note (Signed)
WOC consult Note Reason for Consult:Lt leg wound with cellulitis Wound type:Partial thickness wound Pressure Ulcer POA: /No Measurement:2x1x0 Wound bed: 80& red 20% yellow Drainage (amount, consistency, odor) Moderate amount of green/yellow drainage Periwound: erythema due to diagnosis of cellulitis measuring 22cm x10 cm Dressing procedure/placement/frequency: Foam dressing daily.change every 5 days and/ or prn for soiling Lemmie Evens RN WOC Student Will not plan to follow further unless re-consulted.  [address], RN, MSN, [employer]  [phone]

## 2011-05-20 ENCOUNTER — Other Ambulatory Visit: Payer: Self-pay | Admitting: Family Medicine

## 2011-05-20 LAB — BASIC METABOLIC PANEL
BUN: 15 mg/dL (ref 6–23)
CO2: 25 mEq/L (ref 19–32)
GFR calc non Af Amer: 65 mL/min — ABNORMAL LOW (ref >90)
Glucose, Bld: 109 mg/dL — ABNORMAL HIGH (ref 70–99)
Potassium: 4.1 mEq/L (ref 3.5–5.1)
Sodium: 140 mEq/L (ref 135–145)

## 2011-05-20 LAB — CBC
HCT: 38.2 % — ABNORMAL LOW (ref 39.0–52.0)
Hemoglobin: 13 g/dL (ref 13.0–17.0)
MCHC: 34 g/dL (ref 30.0–36.0)
RBC: 3.96 MIL/uL — ABNORMAL LOW (ref 4.22–5.81)

## 2011-05-20 MED ORDER — MAGNESIUM HYDROXIDE 400 MG/5ML PO SUSP
30.0000 mL | Freq: Once | ORAL | Status: AC
  Administered 2011-05-20: 30 mL via ORAL
  Filled 2011-05-20: qty 30

## 2011-05-20 MED ORDER — HYDROCODONE-ACETAMINOPHEN 5-325 MG PO TABS
1.0000 | ORAL_TABLET | Freq: Four times a day (QID) | ORAL | Status: DC | PRN
  Administered 2011-05-21 (×2): 1 via ORAL
  Filled 2011-05-20 (×2): qty 1

## 2011-05-20 MED ORDER — SODIUM CHLORIDE 0.9 % IJ SOLN
10.0000 mL | INTRAMUSCULAR | Status: DC | PRN
  Administered 2011-05-21: 10 mL

## 2011-05-20 MED ORDER — PREDNISONE 10 MG PO TABS
10.0000 mg | ORAL_TABLET | Freq: Every day | ORAL | Status: DC
  Administered 2011-05-20 – 2011-05-21 (×2): 10 mg via ORAL
  Filled 2011-05-20 (×2): qty 1

## 2011-05-20 MED ORDER — BISACODYL 10 MG RE SUPP
10.0000 mg | Freq: Once | RECTAL | Status: AC
  Administered 2011-05-20: 10 mg via RECTAL
  Filled 2011-05-20: qty 1

## 2011-05-20 MED ORDER — SODIUM CHLORIDE 0.9 % IV SOLN
1.0000 g | INTRAVENOUS | Status: DC
  Administered 2011-05-20 – 2011-05-21 (×2): 1 g via INTRAVENOUS
  Filled 2011-05-20 (×3): qty 1

## 2011-05-20 NOTE — Progress Notes (Signed)
Subjective:  No new symptoms  Objective: Vital signs in last 24 hours: Temp:  [97.5 F (36.4 C)-98 F (36.7 C)] 97.5 F (36.4 C) (04/17 1233) Pulse Rate:  [58-68] 64  (04/17 1233) Resp:  [18] 18  (04/17 1233) BP: (119-132)/(76-94) 132/86 mmHg (04/17 1233) SpO2:  [93 %-98 %] 95 % (04/17 1233) Weight change:  Last BM Date: 05/17/11  Intake/Output from previous day: 04/16 0701 - 04/17 0700 In: 1514 [P.O.:1404; I.V.:10; IV Piggyback:100] Out: 350 [Urine:350]     Physical Exam: Alert and oriented x3 CVS: RRR Rs: CTAB Abdomen : soft, NT LE +1 edema on the right , mild erythema, hyperpigmentation    Lab Results:  Basename 05/20/11 0645 05/19/11 0518  WBC 7.0 9.4  HGB 13.0 13.2  HCT 38.2* 38.5*  PLT 250 211    Basename 05/20/11 0645 05/19/11 0518  NA 140 139  K 4.1 4.3  CL 106 103  CO2 25 26  GLUCOSE 109* 139*  BUN 15 15  CREATININE 1.11 1.09  CALCIUM 8.7 8.6   Recent Results (from the past 240 hour(s))  CULTURE, BLOOD (ROUTINE X 2)     Status: Normal (Preliminary result)   Collection Time   05/18/11  5:40 PM      Component Value Range Status Comment   Specimen Description BLOOD RIGHT ARM   Final    Special Requests BOTTLES DRAWN AEROBIC AND ANAEROBIC 10CC   Final    Culture  Setup Time [id number]   Final    Culture     Final    Value:        BLOOD CULTURE RECEIVED NO GROWTH TO DATE CULTURE WILL BE HELD FOR 5 DAYS BEFORE ISSUING A FINAL NEGATIVE REPORT   Report Status PENDING   Incomplete   CULTURE, BLOOD (ROUTINE X 2)     Status: Normal (Preliminary result)   Collection Time   05/18/11  5:45 PM      Component Value Range Status Comment   Specimen Description BLOOD LEFT ARM   Final    Special Requests BOTTLES DRAWN AEROBIC AND ANAEROBIC 10CC   Final    Culture  Setup Time [id number]   Final    Culture     Final    Value:        BLOOD CULTURE RECEIVED NO GROWTH TO DATE CULTURE WILL BE HELD FOR 5 DAYS BEFORE ISSUING A FINAL NEGATIVE REPORT   Report  Status PENDING   Incomplete   WOUND CULTURE     Status: Normal (Preliminary result)   Collection Time   05/18/11  9:48 PM      Component Value Range Status Comment   Specimen Description WOUND LEG   Final    Special Requests NONE   Final    Gram Stain     Final    Value: NO WBC SEEN     NO SQUAMOUS EPITHELIAL CELLS SEEN     NO ORGANISMS SEEN   Culture NO GROWTH 1 DAY   Final    Report Status PENDING   Incomplete      Studies/Results: [name]  05/19/2011  *RADIOLOGY REPORT*  Clinical Data:  Cellulitis of the left lower leg.  MRI OF THE LEFT LOWER LEG WITH AND WITHOUT CONTRAST  Technique:  Multiplanar, multisequence MR imaging of the left lower leg was performed before and after the administration of intravenous contrast.  Contrast: 20mL MULTIHANCE GADOBENATE DIMEGLUMINE 529 MG/ML IV SOLN  Comparison:  None.  Findings: The patient has diffuse subcutaneous edema circumferentially in the left lower leg.  There is no osteomyelitis or abscess in the left lower leg.  The patient has bilateral fatty atrophy of the muscles of the lower legs particularly of the posterior compartment.  After contrast administration the subcutaneous soft tissues enhance consistent with cellulitis.  There is no evidence of deep fasciitis.  IMPRESSION: Extensive cellulitis of the left lower leg.  No fasciitis, osteomyelitis, or abscesses.  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  Original Report Authenticated By: Gwynn Burly, M.D.    Medications: Scheduled Meds:    . aspirin  325 mg Oral Daily  . bisacodyl  10 mg Rectal Once  . calcium carbonate  3 tablet Oral Q breakfast  . ertapenem  1 g Intravenous Q24H  . heparin  5,000 Units Subcutaneous Q8H  . lisinopril  5 mg Oral Daily  . magnesium hydroxide  30 mL Oral Once  . mulitivitamin with minerals  1 tablet Oral Daily  . predniSONE  10 mg Oral Daily  . sodium chloride  10 mL Intravenous Q12H  . timolol  1 drop Both  Eyes QHS  . vitamin B-12  1,000 mcg Oral Daily  . DISCONTD: hydrocortisone sod succinate (SOLU-CORTEF) injection  50 mg Intravenous Q12H  . DISCONTD: oxyCODONE  5 mg Oral Q6H  . DISCONTD: piperacillin-tazobactam (ZOSYN)  IV  3.375 g Intravenous Q8H  . DISCONTD: vancomycin  1,000 mg Intravenous Q12H   Continuous Infusions:   PRN Meds:.acetaminophen, acetaminophen, HYDROcodone-acetaminophen, HYDROmorphone, ondansetron (ZOFRAN) IV, ondansetron  Assessment/Plan: Principal Problem:  Cellulitis and abscess of leg: Patient presented with progressive cellulitis of left leg due to an infected left shin wound, and has failed outpatient treatment. In view of rapid progression and immunocompromised status from chronic steroid therapy, was commenced on iv Vancomycin/Zosyn, now day#3. Wife brought cultures from Urgent care - positive for  Serratia and Enterococus - both sensitive to ertapenem Clinical improvement has been dramatic, patient has had no pyrexia overnight, and wcc has normalized. Further improvement is anticipated. MRI LLE, done 05/18/11, showed extensive cellulitis of the left lower leg. No fasciitis, osteomyelitis, or abscesses. 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 Patient has been reassured accordingly. Wound and blood cultures have revealed no growth so far.  Will place picc for 7 days of home iv abx   1. HTN (hypertension): BP is well controlled at present.  2. 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 He is on chronic steroid therapy, and appears stable from this view point.  3. Steroid long-term use: Patient was commenced on stress dose of steroids, to cover acute illness.   Comment: Elevated LLE/WOC consult for wound care/Increase activity/PT evaluation. Have discontinued iv fluids. Anticipate 4-5 days of iv antibiotics, prior to discharge.    LOS: 2 days   [name] 05/20/2011, 3:19 PM

## 2011-05-20 NOTE — Progress Notes (Signed)
Physical Therapy Evaluation Patient Details Name: [name] MRN: [id number] DOB: [birth year] Today's Date: 05/20/2011  Problem List:  Patient Active Problem List  Diagnoses  . HYPERTENSION  . VASOVAGAL SYNCOPE  . SYSTOLIC MURMUR  . Inclusion body myositis (IBM)  . Chronic inflammatory demyelinating polyneuropathy  . Hyperglycemia, drug-induced  . Colon cancer screening  . Prostate cancer screening  . Bronchitis  . Cellulitis of leg  . Cellulitis and abscess of leg  . HTN (hypertension)  . CIDP (chronic inflammatory demyelinating polyneuropathy)  . IBM (inclusion body myositis)  . Steroid long-term use    Past Medical History:  Past Medical History  Diagnosis Date  . Diverticulosis of colon 01/95  . Hypertension 08/07  . Polyneuropathy      Chronic inflammatory demylenating - Prednisone per [facility]  . Hyperglycemia     Related to chronic steroid use  . Nephrolithiasis 1993    X 2  . Cholelithiasis 01/95    via abdominal ultrasound  . Syncope 08/05/07    Presumed hypotension  . Cancer     skin cancer, resected by derm (Dr. Adolphus Birchwood)  . 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 Lumbar disc surgery 1988  . Lumbar disc surgery 2000    L3/4 rupture  . Nephrolithiasis   . Cholecystectomy 10/27/99    Daphine Deutscher)  . Doppler echocardiography 08/01    T.R.; T.R.; P/R. borderline LVH  . Hernia repair 06/17/04    Laparoscopic, bilateral  . Esophagogastroduodenoscopy 12/19/05    gastritis; H.H.    PT Assessment/Plan/Recommendation PT Assessment Clinical Impression Statement: Pt is a 71 y/o male admitted with left LE cellulitis along with the below PT problem list.  Pt would benefit from acute PT to maximize independence and facilitate d/c home with HHPT. PT Recommendation/Assessment: Patient will need skilled PT in the acute care venue PT Problem List: Decreased strength;Decreased  activity tolerance;Decreased balance;Decreased mobility;Decreased knowledge of use of DME;Pain Barriers to Discharge: None PT Therapy Diagnosis : Difficulty walking;Acute pain PT Plan PT Frequency: Min 3X/week PT Treatment/Interventions: DME instruction;Gait training;Stair training;Functional mobility training;Therapeutic activities;Balance training;Patient/family education PT Recommendation Follow Up Recommendations: Home health PT Equipment Recommended: Rolling walker with 5" wheels PT Goals  Acute Rehab PT Goals PT Goal Formulation: With patient/family Time For Goal Achievement: 7 days Pt will go Supine/Side to Sit: with modified independence PT Goal: Supine/Side to Sit - Progress: Goal set today Pt will go Sit to Supine/Side: with modified independence PT Goal: Sit to Supine/Side - Progress: Goal set today Pt will go Sit to Stand: with modified independence PT Goal: Sit to Stand - Progress: Goal set today Pt will go Stand to Sit: with modified independence PT Goal: Stand to Sit - Progress: Goal set today Pt will Ambulate: >150 feet;with modified independence;with least restrictive assistive device PT Goal: Ambulate - Progress: Goal set today Pt will Go Up / Down Stairs: 3-5 stairs;with min assist;with least restrictive assistive device PT Goal: Up/Down Stairs - Progress: Goal set today  PT Evaluation Precautions/Restrictions  Precautions Precautions: Fall Restrictions Weight Bearing Restrictions: No Prior Functioning  Home Living Lives With: Spouse Available Help at Discharge: Family Type of Home: House Home Access: Stairs to enter Secretary/administrator of Steps: 3 Entrance Stairs-Rails: Right Home Layout: One level Home Adaptive Equipment: Straight cane Prior Function Level of Independence: Independent with assistive device(s) Able to Take Stairs?: No Cognition Cognition Arousal/Alertness: Awake/alert Overall Cognitive Status: Appears within functional limits  for  tasks assessed Sensation/Coordination Sensation Light Touch: Impaired by gross assessment (Decreased in bilateral feet.) Stereognosis: Not tested Hot/Cold: Not tested Proprioception: Not tested Coordination Gross Motor Movements are Fluid and Coordinated: Yes Fine Motor Movements are Fluid and Coordinated: Yes Extremity Assessment RUE Assessment RUE Assessment: Within Functional Limits LUE Assessment LUE Assessment: Within Functional Limits RLE Assessment RLE Assessment: Exceptions to [facility] RLE Strength RLE Overall Strength: Deficits;Due to premorbid status RLE Overall Strength Comments: 4/5 LLE Assessment LLE Assessment: Exceptions to [facility] LLE Strength LLE Overall Strength: Deficits;Due to premorbid status LLE Overall Strength Comments: 4/5 Pain 2/10 with session in left LE with ambulation.  Pt repositioned with left LE elevated. Mobility (including Balance) Bed Mobility Bed Mobility: Yes Sit to Supine: 4: Min assist;HOB flat Sit to Supine - Details (indicate cue type and reason): Assist to left LE due to pain. Transfers Transfers: Yes Sit to Stand: 4: Min assist;With upper extremity assist;From chair/3-in-1;From bed (2 trials.) Sit to Stand Details (indicate cue type and reason): Assist for balance with cues for safest hand placement. Stand to Sit: 4: Min assist;With upper extremity assist;To bed (2 trials.) Stand to Sit Details: Assist to slow descent with cues for hand placement and safety. Ambulation/Gait Ambulation/Gait: Yes Ambulation/Gait Assistance: 4: Min assist Ambulation/Gait Assistance Details (indicate cue type and reason): Assist for balance with cues for tall posture.  Pt with slight hyperextension of bilateral knees with stance to ensure bilateral LEs do not buckle in stance. Ambulation Distance (Feet): 200 Feet Assistive device: Rolling walker Gait Pattern: Decreased stride length;Right genu recurvatum;Left genu recurvatum Stairs: No Wheelchair  Mobility Wheelchair Mobility: No  Posture/Postural Control Posture/Postural Control: No significant limitations Balance Balance Assessed: No End of Session PT - End of Session Equipment Utilized During Treatment: Gait belt Activity Tolerance: Patient tolerated treatment well Patient left: in bed;with call bell in reach;with family/visitor present Nurse Communication: Mobility status for transfers;Mobility status for ambulation General Behavior During Session: [facility] for tasks performed Cognition: [facility] for tasks performed  Cephus Shelling 05/20/2011, 2:48 PM  05/20/2011 Cephus Shelling, PT, DPT [phone]

## 2011-05-21 LAB — WOUND CULTURE

## 2011-05-21 MED ORDER — HEPARIN SOD (PORK) LOCK FLUSH 100 UNIT/ML IV SOLN
250.0000 [IU] | INTRAVENOUS | Status: AC | PRN
  Administered 2011-05-21: 250 [IU]

## 2011-05-21 MED ORDER — SODIUM CHLORIDE 0.9 % IV SOLN: 1.0000 g | INTRAVENOUS | Status: DC

## 2011-05-21 NOTE — Progress Notes (Signed)
Pt was admitted for cellulitis of L leg and an open wound of L lower leg. Pt has circled area of L lower and upper leg. Pt had reddness, warmth and pain of the circled area. Pt noted to have decreased reddness, warmth and pain of circled area of L leg. Pt still has some pain and reddness of his L lower leg esp foot. Pt noted to have 2+ pitting edema of L foot. Pt has + pedal pulses bilaterally. Pt has a dry mepitel dressing of L lower leg lateral that per rept was placed yesterday. Pt has no other skin issues. Pt can turn self and floats heels. Pt is encouraged to elevate BLE to prevent/minimize edema.Pt verb understanding and agrees to comply. Pt, per rept, had a BM yesterday after "several days" following a soap suds enema as per rept. Pt abdomen is soft flat nontender and nondistended. BS+x4. Pt denies nausea or vomiting and repts passing gas. Pt lungs CTA. Heart rate regular rate and rhythm. No s/sx resp or cardiac distress and no c/o such. Vital signs stable. Pt voids quantity sufficient without difficulty. Pt denies cough. Pt has a, per rept, R arm midline IV access placed yesterday for home IV antibodics. Wife at bedside and supportive. Pt is alert and oriented x 3.

## 2011-05-21 NOTE — Progress Notes (Signed)
Utilization Review Completed.[name] T4/18/2013   

## 2011-05-21 NOTE — Discharge Summary (Signed)
Patient ID: [name] MRN: [id number] DOB/AGE: January 10, 1941 71 y.o. Primary Care Physician:Michael Para March, MD, MD Admit date: 05/18/2011 Discharge date: 05/21/2011    Discharge Diagnoses:    Cellulitis and abscess of leg - culture from office growing Serratia and Enterococcus   HTN (hypertension)  CIDP (chronic inflammatory demyelinating polyneuropathy)  IBM (inclusion body myositis)  Steroid long-term use   Medication List  As of 05/21/2011  1:59 PM   START taking these medications         sodium chloride 0.9 % SOLN 50 mL with ertapenem 1 G SOLR 1 g   Inject 1 g into the vein daily.         CONTINUE taking these medications         aspirin 325 MG tablet      Calcium 500 MG tablet      HYDROcodone-acetaminophen 5-325 MG per tablet   Commonly known as: NORCO      lisinopril 5 MG tablet   Commonly known as: PRINIVIL,ZESTRIL      multivitamin tablet      predniSONE 5 MG tablet   Commonly known as: DELTASONE      timolol 0.5 % ophthalmic solution   Commonly known as: TIMOPTIC      Vitamin B-12 500 MCG Subl         STOP taking these medications         doxycycline 100 MG EC tablet      mupirocin ointment 2 %          Where to get your medications    These are the prescriptions that you need to pick up.   You may get these medications from any pharmacy.         sodium chloride 0.9 % SOLN 50 mL with ertapenem 1 G SOLR 1 g            Discharged Condition: improved    Consults: none   Significant Diagnostic Studies: [name]  05/19/2011  *RADIOLOGY REPORT*  Clinical Data:  Cellulitis of the left lower leg.  MRI OF THE LEFT LOWER LEG WITH AND WITHOUT CONTRAST  Technique:  Multiplanar, multisequence MR imaging of the left lower leg was performed before and after the administration of intravenous contrast.  Contrast: 20mL MULTIHANCE GADOBENATE DIMEGLUMINE 529 MG/ML IV SOLN  Comparison:   None.  Findings: The patient has  diffuse subcutaneous edema circumferentially in the left lower leg.  There is no osteomyelitis or abscess in the left lower leg.  The patient has bilateral fatty atrophy of the muscles of the lower legs particularly of the posterior compartment.  After contrast administration the subcutaneous soft tissues enhance consistent with cellulitis.  There is no evidence of deep fasciitis.  IMPRESSION: Extensive cellulitis of the left lower leg.  No fasciitis, osteomyelitis, or abscesses.  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  Original Report Authenticated By: [name], M.D.    Lab Results: Results for orders placed during the hospital encounter of 05/18/11 (from the past 48 hour(s))  CBC     Status: Abnormal   Collection Time   05/20/11  6:45 AM      Component Value Range Comment   WBC 7.0  4.0 - 10.5 (K/uL)    RBC 3.96 (*) 4.22 - 5.81 (MIL/uL)    Hemoglobin 13.0  13.0 - 17.0 (g/dL)    HCT 78.4 (*) 69.6 - 52.0 (%)  MCV 96.5  78.0 - 100.0 (fL)    MCH 32.8  26.0 - 34.0 (pg)    MCHC 34.0  30.0 - 36.0 (g/dL)    RDW 16.1  09.6 - 04.5 (%)    Platelets 250  150 - 400 (K/uL)   BASIC METABOLIC PANEL     Status: Abnormal   Collection Time   05/20/11  6:45 AM      Component Value Range Comment   Sodium 140  135 - 145 (mEq/L)    Potassium 4.1  3.5 - 5.1 (mEq/L)    Chloride 106  96 - 112 (mEq/L)    CO2 25  19 - 32 (mEq/L)    Glucose, Bld 109 (*) 70 - 99 (mg/dL)    BUN 15  6 - 23 (mg/dL)    Creatinine, Ser 4.09  0.50 - 1.35 (mg/dL)    Calcium 8.7  8.4 - 10.5 (mg/dL)    GFR calc non Af Amer 65 (*) >90 (mL/min)    GFR calc Af Amer 75 (*) >90 (mL/min)    Recent Results (from the past 240 hour(s))  CULTURE, BLOOD (ROUTINE X 2)     Status: Normal (Preliminary result)   Collection Time   05/18/11  5:40 PM      Component Value Range Status Comment   Specimen Description BLOOD RIGHT ARM   Final    Special Requests BOTTLES DRAWN AEROBIC AND ANAEROBIC 10CC   Final    Culture  Setup  Time [id number]   Final    Culture     Final    Value:        BLOOD CULTURE RECEIVED NO GROWTH TO DATE CULTURE WILL BE HELD FOR 5 DAYS BEFORE ISSUING A FINAL NEGATIVE REPORT   Report Status PENDING   Incomplete   CULTURE, BLOOD (ROUTINE X 2)     Status: Normal (Preliminary result)   Collection Time   05/18/11  5:45 PM      Component Value Range Status Comment   Specimen Description BLOOD LEFT ARM   Final    Special Requests BOTTLES DRAWN AEROBIC AND ANAEROBIC 10CC   Final    Culture  Setup Time [id number]   Final    Culture     Final    Value:        BLOOD CULTURE RECEIVED NO GROWTH TO DATE CULTURE WILL BE HELD FOR 5 DAYS BEFORE ISSUING A FINAL NEGATIVE REPORT   Report Status PENDING   Incomplete   WOUND CULTURE     Status: Normal   Collection Time   05/18/11  9:48 PM      Component Value Range Status Comment   Specimen Description WOUND LEG   Final    Special Requests NONE   Final    Gram Stain     Final    Value: NO WBC SEEN     NO SQUAMOUS EPITHELIAL CELLS SEEN     NO ORGANISMS SEEN   Culture NO GROWTH 2 DAYS   Final    Report Status 05/21/2011 FINAL   Final      Hospital Course:  71 yo man with CIDP on steroids admitted with severe cellulitis unresponsive to outpatient therapy. He was admitted for iv abx on 05/18/11   1. Cellulitis and abscess of leg: Patient presented with progressive cellulitis of left leg due to an infected left shin wound, and has failed outpatient treatment. In view of rapid progression and immunocompromised status from chronic steroid therapy, was  commenced on iv Vancomycin/Zosyn. Clinical improvement has been dramatic, patient has had no pyrexia overnight, and wcc has normalized.  MRI LLE, done 05/18/11, showed extensive cellulitis of the left lower leg but no fasciitis, osteomyelitis, or abscesses.  Wound and blood cultures have revealed no growth so far. Outside cultures became available on 05/20/11 and they were positive for enterococcus and serratia.  Patient had a picc line placed and started on ertapenem with plans to continue for 7 more days as outpatient. 2. HTN (hypertension): BP was well controlled on home meds 3. 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 He is on chronic steroid therapy, and appears stable from this view point.  4. Steroid long-term use: Patient was commenced on stress dose of steroids, to cover acute illness on admission and chagned back to his home dose prior to DC  Discharge Exam: Blood pressure 133/77, pulse 70, temperature 98 F (36.7 C), temperature source Oral, resp. rate 12, height 6\' 6"  (1.981 m), weight 102.059 kg (225 lb), SpO2 96.00%. Alert and oriented x3 Cvs: rrr Rs: ctab  Abdomen : soft, NT   Disposition: home Time spent in discharge 45 minutes   Discharge Orders    Future Appointments: Provider: Department: Dept Phone: Center:   06/04/2011 8:15 AM [facility] [phone] [id number]   06/11/2011 8:45 AM Joaquim Nam, MD [facility] [phone] [id number]     Future Orders Please Complete By Expires   Diet general      Increase activity slowly         Follow-up Information    Follow up with Crawford Givens, MD. Schedule an appointment as soon as possible for a visit in 1 week.   Contact information:   [address] [place] [address] [phone]          Signed: Lonia Blood 05/21/2011, 1:59 PM

## 2011-05-21 NOTE — Progress Notes (Signed)
PT Progress Note:     05/21/11 1400  PT Visit Information  Last PT Received On 05/21/11  Precautions  Precautions Fall  Restrictions  Weight Bearing Restrictions No  Bed Mobility  Bed Mobility Yes  Sit to Supine HOB flat;6: Modified independent (Device/Increase time)  Transfers  Transfers Yes  Sit to Stand From bed;With upper extremity assist (Min Guard (A))  Sit to Stand Details (indicate cue type and reason) cues for hand placement  Stand to Sit To chair/3-in-1;With armrests;With upper extremity assist;Other (comment) (Min Guard (A))  Stand to [employer] Details Cues for hand placement  Ambulation/Gait  Ambulation/Gait Assistance Other (comment) (Min Guard (A))  Ambulation/Gait Assistance Details (indicate cue type and reason) Guarding for safety; Cues for upright posture- RW adjusted up a notch to facilitate upright posture.    Ambulation Distance (Feet) 200 Feet  Assistive device Rolling walker  Gait Pattern Decreased stride length;Right genu recurvatum;Left genu recurvatum;Trunk flexed  Stairs Yes  Stairs Assistance Other (comment) (Min Guard (A))  Stair Management Technique One rail Right;Forwards;Step to pattern  Number of Stairs 3  (2x's. )  Wheelchair Mobility  Wheelchair Mobility No  Posture/Postural Control  Posture/Postural Control No significant limitations  Balance  Balance Assessed No  PT - End of Session  Equipment Utilized During Treatment Gait belt  Activity Tolerance Patient tolerated treatment well  Patient left Other (comment);with family/visitor present (in bathroom per pt request)  Nurse Communication Other (comment) (pt left in bathroom per request)  General  Behavior During Session [facility] for tasks performed  Cognition [facility] for tasks performed  PT - Assessment/Plan  Comments on Treatment Session Pt progressing with PT goals.  Motivated & very pleasant.  Reports pain improving in LLE. Did well today.   PT Plan Discharge plan remains appropriate    Follow Up Recommendations Home health PT  Equipment Recommended Rolling walker with 5" wheels  Acute Rehab PT Goals  PT Goal: Supine/Side to Sit - Progress Met  PT Goal: Sit to Stand - Progress Progressing toward goal  PT Goal: Stand to Sit - Progress Progressing toward goal  PT Goal: Ambulate - Progress Progressing toward goal  PT Goal: Up/Down Stairs - Progress Met      Verdell Face, PTA [phone] 05/21/2011

## 2011-05-21 NOTE — Progress Notes (Signed)
   CARE MANAGEMENT NOTE 05/21/2011  Patient:  [name], [name]   Account Number:  [id number]  Date Initiated:  05/21/2011  Documentation initiated by:  Alvira Philips Assessment:   71 yr-old male adm with dx of abscess and cellulitis; lives with spouse, has cane, assisted with ADLs and IADLs.     Anticipated DC Plan:  HOME W HOME HEALTH SERVICES      DC Planning Services  CM consult      [facility] Choice  HOME HEALTH   Choice offered to / List presented to:  C-1 Patient   DME arranged  WALKER - ROLLING  IV PUMP/EQUIPMENT      DME agency  [facility]     HH arranged  HH-1 RN  IV Antibiotics  HH-2 PT      HH agency  [facility]   Status of service:  Completed, signed off Medicare Important Message given?   (If response is "NO", the following Medicare IM given date fields will be blank) Date Medicare IM given:   Date Additional Medicare IM given:    Discharge Disposition:  HOME W HOME HEALTH SERVICES  Comments:  PCP:  Dr. Crawford Givens with [facility]  04/20/11 Halen Antenucci RN MSN CCM Pt to d/c home on IV antibiotics, will need home therapy per PT.  Provided list of agencies to pt and spouse. Spouse received services from [facility] previously and requests referral to that agency - referral made.

## 2011-05-25 LAB — CULTURE, BLOOD (ROUTINE X 2)
Culture  Setup Time: 201304160148
Culture  Setup Time: 201304160148
Culture: NO GROWTH
Culture: NO GROWTH

## 2011-05-28 ENCOUNTER — Telehealth: Payer: Self-pay

## 2011-05-28 ENCOUNTER — Ambulatory Visit (INDEPENDENT_AMBULATORY_CARE_PROVIDER_SITE_OTHER): Payer: Medicare Other | Admitting: Family Medicine

## 2011-05-28 ENCOUNTER — Encounter: Payer: Self-pay | Admitting: Family Medicine

## 2011-05-28 ENCOUNTER — Ambulatory Visit: Payer: Medicare Other | Admitting: Family Medicine

## 2011-05-28 VITALS — BP 104/70 | HR 76 | Temp 97.7°F | Wt 223.5 lb

## 2011-05-28 DIAGNOSIS — IMO0002 Reserved for concepts with insufficient information to code with codable children: Secondary | ICD-10-CM

## 2011-05-28 DIAGNOSIS — L03119 Cellulitis of unspecified part of limb: Secondary | ICD-10-CM

## 2011-05-28 NOTE — Patient Instructions (Addendum)
We will let [facility] know that I want to prolong antibiotic course for anther 4 days [phone]) Return on Monday for recheck. I think things are going well. Elevate leg as much as able to speed recovery.  Continue dressing changes daily. Update [place] if worsening instead of improving.

## 2011-05-28 NOTE — Progress Notes (Signed)
Spoke with Katie @ [facility] and [facility] Sanes was extended x 4 days.

## 2011-05-28 NOTE — Telephone Encounter (Signed)
Dr Para March out of office today; pt needs f/u [facility] d/c 05/21/11 to see if pt needs to continue IV antibiotic Ertatenem Na 1 gm/169ml for cellulitis. Pt last dose today and [facility] can order more IV antibiotic but will need order today for med to be available to pt on Fri. Pts wife said pt leg is still red, hot to touch, painful when stands and still draining small amt of pus. No fever. Dr Sharen Hones will see pt today at 12:30pm.

## 2011-05-28 NOTE — Progress Notes (Signed)
  Subjective:    Patient ID: [name], male    DOB: 08-03-1940, 71 y.o.   MRN: [id number]  HPI CC: f/u cellulitis  Pleasant 71yo with baseline immunosuppression on prednisone 10mg  daily, also with h/o inclusion body myositis and chronic inflammatory demyelinating polyneuropathy.  DOI: 4/8 scrape left lower leg, got infected, that Saturday 05/16/2011 seen at [facility], treated with CTX shot and PO doxy.  Seen here 05/18/2011 with worsening swelling, deemed failed outpt tx, admitted to hospital for IV Vanc/zosyn.  Discharged 05/21/2011 with PICC and ertapenem for 7 days.  Finished last dose today, presents today for eval and to see if needs further IV abx.  Overall improved pain, swelling, erythema but still significant pain, warmth, redness present.  Feels slowly improving.  D/C summary reviewed as well as wound culture - per report grew serratia and enterococcus (no actual report or sensitivities available).  Have asked to obtain copy.  Blood cultures returned no growth x2.  Also noted mild tranasminitis.  WBC normalized during hospitalization.  [name]  05/19/2011 *RADIOLOGY REPORT* Clinical Data: Cellulitis of the left lower leg. MRI OF THE LEFT LOWER LEG WITH AND WITHOUT CONTRAST Technique: Multiplanar, multisequence MR imaging of the left lower leg was performed before and after the administration of intravenous contrast. Contrast: 20mL MULTIHANCE GADOBENATE DIMEGLUMINE 529 MG/ML IV SOLN Comparison: None. Findings: The patient has diffuse subcutaneous edema circumferentially in the left lower leg. There is no osteomyelitis or abscess in the left lower leg. The patient has bilateral fatty atrophy of the muscles of the lower legs particularly of the posterior compartment. After contrast administration the subcutaneous soft tissues enhance consistent with cellulitis. There is no evidence of deep fasciitis. IMPRESSION: Extensive cellulitis of the left lower leg. No fasciitis,  osteomyelitis, or abscesses. 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 Original Report Authenticated By: [name], M.D.    Past Medical History  Diagnosis Date  . Diverticulosis of colon 01/95  . Hypertension 08/07  . Polyneuropathy      Chronic inflammatory demylenating - Prednisone per [facility]  . Hyperglycemia     Related to chronic steroid use  . Nephrolithiasis 1993    X 2  . Cholelithiasis 01/95    via abdominal ultrasound  . Syncope 08/05/07    Presumed hypotension  . Cancer     skin cancer, resected by derm (Dr. Adolphus Morgan)  . Chronic inflammatory demyelinating polyneuropathy 06/05/2010    Review of Systems Per HPI    Objective:   Physical Exam  Nursing note and vitals reviewed. Constitutional: He appears well-developed and well-nourished. No distress.       Walks with walker  Musculoskeletal: He exhibits edema (2+ edema LLE, 1+ edema RLE).  Skin: Skin is warm and dry. There is erythema (extends to below knee, but below delineated area).       Open sore lateral anterior shin with good granulation tissue, scant yellow discharge  Psychiatric: He has a normal mood and affect.   Calf circumference: 40.5cm left leg, 36.5cm right leg    Assessment & Plan:

## 2011-05-29 NOTE — Assessment & Plan Note (Addendum)
Improvement but slow.  Will extend ertapenem for another 4 days over weekend and have asked pt to return on Monday for rehceck. bandaid removed, dressed wound with abx ointment and wrapped in telfa and kerlex. Consider recheck LFTs for transaminitis found in hospital.

## 2011-05-29 NOTE — Assessment & Plan Note (Signed)
Discussed should let neuro know about cellulitis to see if any change merited in chronic steroid dose to improve wound healing.

## 2011-06-01 ENCOUNTER — Encounter: Payer: Self-pay | Admitting: Family Medicine

## 2011-06-01 ENCOUNTER — Ambulatory Visit (INDEPENDENT_AMBULATORY_CARE_PROVIDER_SITE_OTHER): Payer: Medicare Other | Admitting: Family Medicine

## 2011-06-01 VITALS — BP 110/70 | HR 71 | Temp 98.0°F | Wt 222.0 lb

## 2011-06-01 DIAGNOSIS — L03119 Cellulitis of unspecified part of limb: Secondary | ICD-10-CM

## 2011-06-01 NOTE — Assessment & Plan Note (Signed)
Overall improved but slow process. Continued significant erythema. Extend IV abx for another week, return 1 wk for f/u with myself or PCP. Will ask Selena Batten to call [facility] to extend course. Knows to return sooner if needed.

## 2011-06-01 NOTE — Progress Notes (Signed)
  Subjective:    Patient ID: [name], male    DOB: October 01, 1940, 71 y.o.   MRN: [id number]  HPI CC: f/u L leg cellulitis  See prior note for details - here for 4d f/u after hosp discharge for left leg cellulitis with wound cx growing serratia and enterococcus (records pending), blood cx negative x2.  Sent home with ertapenem IV.  Has been on IV abx for 2 weeks now since hospitalization 05/18/2011.  Reportedly not sensitive to oral abx.  Improved swelling, mildly improved redness.  Continued discomfort but better.  continued erythema.  Review of Systems Per HPI    Objective:   Physical Exam  Nursing note and vitals reviewed. Constitutional: He appears well-developed and well-nourished. No distress.       Walks with walker  Musculoskeletal: He exhibits edema (1+ edema LLE, trace edema RLE).  Skin: Skin is warm and dry. There is erythema (extends to below knee, but below delineated area).       Open sore lateral anterior shin with good granulation tissue, minimal yellow discharge  Psychiatric: He has a normal mood and affect.  overall improved.     Assessment & Plan:

## 2011-06-01 NOTE — Patient Instructions (Signed)
Will ask [facility] to extend antibiotic through PICC for one more week. Return in 1 week for follow up with myself or Dr. Para March.  Return sooner if needed - spreading redness, fever, worsening pain, warmth, or swelling. Good to see you today, call us with questions.

## 2011-06-04 ENCOUNTER — Other Ambulatory Visit: Payer: Medicare Other

## 2011-06-08 ENCOUNTER — Ambulatory Visit: Payer: Medicare Other | Admitting: Family Medicine

## 2011-06-08 ENCOUNTER — Ambulatory Visit (INDEPENDENT_AMBULATORY_CARE_PROVIDER_SITE_OTHER): Payer: Medicare Other | Admitting: Family Medicine

## 2011-06-08 ENCOUNTER — Encounter: Payer: Self-pay | Admitting: Family Medicine

## 2011-06-08 VITALS — BP 112/76 | HR 89 | Temp 98.2°F | Wt 227.0 lb

## 2011-06-08 DIAGNOSIS — L03119 Cellulitis of unspecified part of limb: Secondary | ICD-10-CM

## 2011-06-08 DIAGNOSIS — L02419 Cutaneous abscess of limb, unspecified: Secondary | ICD-10-CM

## 2011-06-08 NOTE — Patient Instructions (Signed)
Flush with saline twice a day starting tomorrow.  Call with update on Wednesday or Thursday, sooner if you have concerns.  Notify [facility] that we are stopping the IV meds but keeping the line in for a few days.  Thanks.

## 2011-06-09 ENCOUNTER — Encounter: Payer: Self-pay | Admitting: Family Medicine

## 2011-06-09 ENCOUNTER — Telehealth: Payer: Self-pay | Admitting: Family Medicine

## 2011-06-09 NOTE — Telephone Encounter (Signed)
Notify [facility].  Will stop IV abx, but will keep port in for a few more days.  Wife will flush port bid and they'll call back with update in about 2-3 days.  If improved, doing well at that point, will need HH to remove the port.  Thanks.

## 2011-06-09 NOTE — Assessment & Plan Note (Signed)
Agreed to stop abx for now.  Will keep port in for a few more days in case he regresses and needs more abx.  Goal to balance cure rate vs exposure to abx.  He agrees, understands.  Flush port bid and will notify HH.  Call back with update in 2-3 days.

## 2011-06-09 NOTE — Telephone Encounter (Signed)
Noted  

## 2011-06-09 NOTE — Progress Notes (Signed)
Cellulitis, now s/p 21 days IV abx.  Much less tender, less erythema, less edema.  Some flaking skin.  Still with port in R arm.  IV abx done at home.  Feeling overall improved.   Meds, vitals, and allergies reviewed.   ROS: See HPI.  Otherwise, noncontributory.  nad R arm with clean port site.  L lower leg with brawny skin changes, some of which appear chronic and it is nearly approaching the appearance of the chronic changes on the R leg.  Minimal edema, skin is flaking off superficially.  Not ttp. Not hot.

## 2011-06-09 NOTE — Telephone Encounter (Signed)
[facility] advised.  They will await our order to remove the pic line.

## 2011-06-10 ENCOUNTER — Telehealth: Payer: Self-pay | Admitting: Family Medicine

## 2011-06-10 NOTE — Telephone Encounter (Signed)
Thanks for update.  Please call [facility] and have them remove the [facility] tomorrow.  Thanks.  Have them notify me of any spreading erythema.

## 2011-06-10 NOTE — Telephone Encounter (Signed)
Patient's wife called to let you know there has been no change in patient's legs.  Patient's wife wanted to make sure that you'll let [facility] know if they can take the pik out.

## 2011-06-11 ENCOUNTER — Encounter: Payer: Medicare Other | Admitting: Family Medicine

## 2011-06-11 NOTE — Telephone Encounter (Signed)
Called [facility] [phone].  Gave order to remove PIC line to [facility] and also asked her to notify Dr. Para March of any spreading erythema.

## 2011-07-23 ENCOUNTER — Other Ambulatory Visit: Payer: Self-pay | Admitting: Family Medicine

## 2011-12-13 ENCOUNTER — Other Ambulatory Visit: Payer: Self-pay | Admitting: Family Medicine

## 2011-12-13 DIAGNOSIS — I10 Essential (primary) hypertension: Secondary | ICD-10-CM

## 2011-12-13 DIAGNOSIS — Z125 Encounter for screening for malignant neoplasm of prostate: Secondary | ICD-10-CM

## 2011-12-13 DIAGNOSIS — R739 Hyperglycemia, unspecified: Secondary | ICD-10-CM

## 2011-12-17 ENCOUNTER — Ambulatory Visit (INDEPENDENT_AMBULATORY_CARE_PROVIDER_SITE_OTHER): Payer: Medicare Other | Admitting: Family Medicine

## 2011-12-17 ENCOUNTER — Encounter: Payer: Self-pay | Admitting: Family Medicine

## 2011-12-17 VITALS — BP 140/90 | HR 69 | Temp 98.2°F | Ht 77.0 in | Wt 226.0 lb

## 2011-12-17 DIAGNOSIS — Z125 Encounter for screening for malignant neoplasm of prostate: Secondary | ICD-10-CM

## 2011-12-17 DIAGNOSIS — I10 Essential (primary) hypertension: Secondary | ICD-10-CM

## 2011-12-17 DIAGNOSIS — R739 Hyperglycemia, unspecified: Secondary | ICD-10-CM

## 2011-12-17 DIAGNOSIS — Z23 Encounter for immunization: Secondary | ICD-10-CM

## 2011-12-17 DIAGNOSIS — R7309 Other abnormal glucose: Secondary | ICD-10-CM

## 2011-12-17 DIAGNOSIS — Z0000 Encounter for general adult medical examination without abnormal findings: Secondary | ICD-10-CM

## 2011-12-17 DIAGNOSIS — G6181 Chronic inflammatory demyelinating polyneuritis: Secondary | ICD-10-CM

## 2011-12-17 LAB — COMPREHENSIVE METABOLIC PANEL
ALT: 63 U/L — ABNORMAL HIGH (ref 0–53)
Albumin: 4.2 g/dL (ref 3.5–5.2)
Alkaline Phosphatase: 62 U/L (ref 39–117)
CO2: 28 mEq/L (ref 19–32)
GFR: 65.19 mL/min (ref >60.00)
Glucose, Bld: 110 mg/dL — ABNORMAL HIGH (ref 70–99)
Potassium: 4.5 mEq/L (ref 3.5–5.1)
Sodium: 141 mEq/L (ref 135–145)
Total Bilirubin: 1.3 mg/dL — ABNORMAL HIGH (ref 0.3–1.2)
Total Protein: 6.7 g/dL (ref 6.0–8.3)

## 2011-12-17 LAB — HEMOGLOBIN A1C: Hgb A1c MFr Bld: 6 % (ref 4.6–6.5)

## 2011-12-17 LAB — LIPID PANEL
Total CHOL/HDL Ratio: 5
Triglycerides: 164 mg/dL — ABNORMAL HIGH (ref 0.0–149.0)

## 2011-12-17 NOTE — Patient Instructions (Addendum)
Recheck in 1 year.  Take care. Don't change your meds.  We'll contact you with your lab report.

## 2011-12-18 ENCOUNTER — Encounter: Payer: Self-pay | Admitting: Family Medicine

## 2011-12-18 ENCOUNTER — Encounter: Payer: Self-pay | Admitting: *Deleted

## 2011-12-18 DIAGNOSIS — Z0000 Encounter for general adult medical examination without abnormal findings: Secondary | ICD-10-CM | POA: Insufficient documentation

## 2011-12-18 NOTE — Assessment & Plan Note (Signed)
Per [facility], continue prednisone.  No sig change in labs.

## 2011-12-18 NOTE — Assessment & Plan Note (Signed)
See scanned forms.  Routine anticipatory guidance given to patient.  See health maintenance. Tetanus 2012 Flu yearly Shingles not indicated  PNA 2008 Colonoscopy 2008 Prostate cancer screening- PSA done today Advance directive d/w pt.  Wife is designated.

## 2011-12-18 NOTE — Progress Notes (Signed)
I have personally reviewed the Medicare Annual Wellness questionnaire and have noted 1. The patient's medical and social history 2. Their use of alcohol, tobacco or illicit drugs 3. Their current medications and supplements 4. The patient's functional ability including ADL's, fall risks, home safety risks and hearing or visual             impairment. 5. Diet and physical activities 6. Evidence for depression or mood disorders  The patients weight, height, BMI have been recorded in the chart and visual acuity is per eye clinic.  I have made referrals, counseling and provided education to the patient based review of the above and I have provided the pt with a written personalized care plan for preventive services.  See scanned forms.  Routine anticipatory guidance given to patient.  See health maintenance. Tetanus 2012 Flu yearly Shingles not indicated  PNA 2008 Colonoscopy 2008 Prostate cancer screening- PSA done today Advance directive d/w pt.  Wife is designated.    Hypertension:    Using medication without problems or lightheadedness: yes Chest pain with exertion:no Edema:no Short of breath:no  CIPD.  Followed at [facility].  Still with gait instability.  Using a cane.  Progressive weakness. Continues on steroids.  Some numbness in fingers and toes.    PMH and SH reviewed  Meds, vitals, and allergies reviewed.   ROS: See HPI.  Otherwise negative.    GEN: nad, alert and oriented HEENT: mucous membranes moist NECK: supple w/o LA CV: rrr. PULM: ctab, no inc wob ABD: soft, +bs EXT: no edema, dec in muscle mass in distal BLE SKIN: no acute rash

## 2011-12-18 NOTE — Assessment & Plan Note (Signed)
Controlled, continue current meds.  I don't want to induce hypotension.

## 2012-03-07 ENCOUNTER — Encounter: Payer: Self-pay | Admitting: Family Medicine

## 2012-03-07 ENCOUNTER — Ambulatory Visit (INDEPENDENT_AMBULATORY_CARE_PROVIDER_SITE_OTHER): Payer: Medicare Other | Admitting: Family Medicine

## 2012-03-07 VITALS — BP 126/72 | HR 66 | Temp 98.1°F | Wt 229.0 lb

## 2012-03-07 DIAGNOSIS — R21 Rash and other nonspecific skin eruption: Secondary | ICD-10-CM

## 2012-03-07 MED ORDER — LORATADINE 10 MG PO TABS: 10.0000 mg | ORAL_TABLET | Freq: Every day | ORAL | Status: DC

## 2012-03-07 MED ORDER — VALACYCLOVIR HCL 1 G PO TABS: 1000.0000 mg | ORAL_TABLET | Freq: Three times a day (TID) | ORAL | Status: DC

## 2012-03-07 NOTE — Progress Notes (Signed)
Rash, mildly itchy, some stinging.  More red after a shower.  Present for about 2 weeks.  Using topical antifungal w/o any help.  Not leg or forearms/hands; present B on trunk and upper arms.    Meds, vitals, and allergies reviewed.   ROS: See HPI.  Otherwise, noncontributory.  nad Irregular distribution of small blanching red raised lesions, nondermatomal, on the chest/abdomen upper arms and his back.  No vesicular changes.   No oral or palmar lesions.

## 2012-03-07 NOTE — Patient Instructions (Addendum)
Start the valtrex today.  If still itching, then add on claritin 10mg  a day (OTC).   If not improved, then notify us or dermatology.

## 2012-03-08 DIAGNOSIS — R21 Rash and other nonspecific skin eruption: Secondary | ICD-10-CM | POA: Insufficient documentation

## 2012-03-08 NOTE — Assessment & Plan Note (Signed)
I called over to derm but Dr. Adolphus Birchwood was out sick.  Unclear source.  Nontoxic.  In meantime,  I would cover for atypical zoster with valtrex given his steroid use.  Use claritin for itching and notify us or derm if not improved.  He agrees.

## 2012-06-07 ENCOUNTER — Ambulatory Visit (INDEPENDENT_AMBULATORY_CARE_PROVIDER_SITE_OTHER): Payer: Medicare Other | Admitting: Family Medicine

## 2012-06-07 ENCOUNTER — Encounter: Payer: Self-pay | Admitting: Family Medicine

## 2012-06-07 VITALS — BP 150/90 | HR 80 | Temp 98.2°F | Ht 77.0 in | Wt 229.8 lb

## 2012-06-07 DIAGNOSIS — L03116 Cellulitis of left lower limb: Secondary | ICD-10-CM | POA: Insufficient documentation

## 2012-06-07 DIAGNOSIS — G609 Hereditary and idiopathic neuropathy, unspecified: Secondary | ICD-10-CM

## 2012-06-07 DIAGNOSIS — L02419 Cutaneous abscess of limb, unspecified: Secondary | ICD-10-CM

## 2012-06-07 DIAGNOSIS — L03119 Cellulitis of unspecified part of limb: Secondary | ICD-10-CM

## 2012-06-07 MED ORDER — DOXYCYCLINE HYCLATE 100 MG PO TABS: 100.0000 mg | ORAL_TABLET | Freq: Two times a day (BID) | ORAL | Status: DC

## 2012-06-07 MED ORDER — MUPIROCIN 2 % EX OINT: TOPICAL_OINTMENT | Freq: Two times a day (BID) | CUTANEOUS | Status: DC

## 2012-06-07 NOTE — Assessment & Plan Note (Signed)
Mild early cellulitis in immunocompromised pt. 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 Apply topical antibiotics ointment. Peripheral neuropathy and edema may cause slowed healing. Elevate foot above heart.  Close follow up in 48 hours with myself or PCP.

## 2012-06-07 NOTE — Progress Notes (Signed)
  Subjective:    Patient ID: [name], male    DOB: Jun 14, 1940, 72 y.o.   MRN: [id number]  HPI  72 year old male with history of  CIDP, [employer], and HTN presents with injury to left  [place].  After mowing the lawn 4 days ago.. May have injured left leg ... Has no sensation in lower legs. This AM he noted discharge at site, redness around lesion. No fever, no pain at site.  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    BP slightly elevated today. Last check at neurologist 120/70.    Review of Systems  Constitutional: Negative for fever and fatigue.  HENT: Negative for ear pain.   Eyes: Negative for pain.  Respiratory: Negative for cough and wheezing.   Cardiovascular: Negative for chest pain.  Gastrointestinal: Negative for abdominal pain.       Objective:   Physical Exam  Constitutional: Vital signs are normal. He appears well-developed and well-nourished.  Walking with cane  HENT:  Head: Normocephalic.  Right Ear: Hearing normal.  Left Ear: Hearing normal.  Nose: Nose normal.  Mouth/Throat: Oropharynx is clear and moist and mucous membranes are normal.  Neck: Trachea normal. Carotid bruit is not present. No mass and no thyromegaly present.  Cardiovascular: Normal rate, regular rhythm and normal pulses.  Exam reveals no gallop, no distant heart sounds and no friction rub.   No murmur heard. No peripheral edema  Pulmonary/Chest: Effort normal and breath sounds normal. No respiratory distress.  Skin: Skin is warm, dry and intact. No rash noted.     1.5 cm open wound with scab... 5 cm surrounding darkened red skin, mild warmth   B shins are mildly hyperpigmented.  Psychiatric: He has a normal mood and affect. His speech is normal and behavior is normal. Thought content normal.            Assessment & Plan:

## 2012-06-07 NOTE — Patient Instructions (Addendum)
Start and complete antibiotics. Doxycyline 100 mg twice daily x 10 days. Apply mupirocin daily. Elevate your leg above heart as able. Follow up with Dr. Para March or myself in 48 hours.

## 2012-06-09 ENCOUNTER — Encounter: Payer: Self-pay | Admitting: Family Medicine

## 2012-06-09 ENCOUNTER — Ambulatory Visit (INDEPENDENT_AMBULATORY_CARE_PROVIDER_SITE_OTHER): Payer: Medicare Other | Admitting: Family Medicine

## 2012-06-09 VITALS — BP 106/64 | HR 84 | Temp 98.0°F | Wt 227.5 lb

## 2012-06-09 DIAGNOSIS — L03119 Cellulitis of unspecified part of limb: Secondary | ICD-10-CM

## 2012-06-09 DIAGNOSIS — L03116 Cellulitis of left lower limb: Secondary | ICD-10-CM

## 2012-06-09 DIAGNOSIS — L111 Transient acantholytic dermatosis [Grover]: Secondary | ICD-10-CM

## 2012-06-09 DIAGNOSIS — L988 Other specified disorders of the skin and subcutaneous tissue: Secondary | ICD-10-CM

## 2012-06-09 NOTE — Progress Notes (Signed)
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  He has topical treatment for this.  He'll notify us about the med to update his list.    Cellulitis.  L leg is much improved over the last 48h. Scabbed over and not ttp. No fevers.  He is on bactroban and doxy.  Tolerating both.  Dec in erythema.  No purulent discharge.   Meds, vitals, and allergies reviewed.   ROS: See HPI.  Otherwise, noncontributory.  nad Walks with cane, at baseline L skin with small adherent scab but no acute erythema.  No purulent discharge. He has dec in sensation at baseline on his distal BLE

## 2012-06-09 NOTE — Assessment & Plan Note (Signed)
Improved, continue current meds and f/u prn.  He agrees.

## 2012-06-09 NOTE — Patient Instructions (Addendum)
This should keep getting better. Keep it clean and covered. Take care.  Glad to see you.

## 2012-07-02 ENCOUNTER — Other Ambulatory Visit: Payer: Self-pay | Admitting: Family Medicine

## 2012-07-29 ENCOUNTER — Other Ambulatory Visit: Payer: Self-pay | Admitting: Family Medicine

## 2012-07-29 MED ORDER — LISINOPRIL 5 MG PO TABS: ORAL_TABLET | ORAL | Status: DC

## 2012-07-29 NOTE — Telephone Encounter (Signed)
[facility] faxed request for 90 day supply.

## 2012-12-16 ENCOUNTER — Ambulatory Visit (INDEPENDENT_AMBULATORY_CARE_PROVIDER_SITE_OTHER): Payer: Medicare Other | Admitting: Family Medicine

## 2012-12-16 ENCOUNTER — Encounter: Payer: Self-pay | Admitting: Family Medicine

## 2012-12-16 VITALS — BP 118/74 | HR 71 | Temp 98.1°F | Ht 77.0 in | Wt 219.5 lb

## 2012-12-16 DIAGNOSIS — Z23 Encounter for immunization: Secondary | ICD-10-CM

## 2012-12-16 DIAGNOSIS — I10 Essential (primary) hypertension: Secondary | ICD-10-CM

## 2012-12-16 DIAGNOSIS — T50904A Poisoning by unspecified drugs, medicaments and biological substances, undetermined, initial encounter: Secondary | ICD-10-CM

## 2012-12-16 DIAGNOSIS — G6181 Chronic inflammatory demyelinating polyneuritis: Secondary | ICD-10-CM

## 2012-12-16 DIAGNOSIS — R7309 Other abnormal glucose: Secondary | ICD-10-CM

## 2012-12-16 DIAGNOSIS — Z0000 Encounter for general adult medical examination without abnormal findings: Secondary | ICD-10-CM

## 2012-12-16 DIAGNOSIS — R739 Hyperglycemia, unspecified: Secondary | ICD-10-CM

## 2012-12-16 LAB — COMPREHENSIVE METABOLIC PANEL
Albumin: 4 g/dL (ref 3.5–5.2)
BUN: 21 mg/dL (ref 6–23)
CO2: 29 mEq/L (ref 19–32)
GFR: 60.79 mL/min (ref >60.00)
Glucose, Bld: 96 mg/dL (ref 70–99)
Potassium: 3.9 mEq/L (ref 3.5–5.1)
Sodium: 141 mEq/L (ref 135–145)
Total Protein: 6.6 g/dL (ref 6.0–8.3)

## 2012-12-16 LAB — LIPID PANEL
Cholesterol: 185 mg/dL (ref 0–200)
Triglycerides: 184 mg/dL — ABNORMAL HIGH (ref 0.0–149.0)

## 2012-12-16 NOTE — Assessment & Plan Note (Signed)
See notes on labs. 

## 2012-12-16 NOTE — Progress Notes (Signed)
Pre-visit discussion using our clinic review tool. No additional management support is needed unless otherwise documented below in the visit note.  I have personally reviewed the Medicare Annual Wellness questionnaire and have noted 1. The patient's medical and social history 2. Their use of alcohol, tobacco or illicit drugs 3. Their current medications and supplements 4. The patient's functional ability including ADL's, fall risks, home safety risks and hearing or visual             impairment. 5. Diet and physical activities 6. Evidence for depression or mood disorders  The patients weight, height, BMI have been recorded in the chart and visual acuity is per eye clinic.  I have made referrals, counseling and provided education to the patient based review of the above and I have provided the pt with a written personalized care plan for preventive services.  See scanned forms.  Routine anticipatory guidance given to patient.  See health maintenance. Flu done today Shingles N/A due to steroid use.  PNA 2008 Tetanus 2012 Colonoscopy 2008 Prostate cancer screening and PSA options (with potential risks and benefits of testing vs not testing) were discussed along with recent recs/guidelines.  He declined testing PSA at this point. Advance directive d/w pt.  Wife would be designated if incapacitated.  Cognitive function addressed- see scanned forms- and if abnormal then additional documentation follows.   Hypertension:    Using medication without problems or lightheadedness: yes Chest pain with exertion:no Edema:occ, mild Short of breath: at baseline  Neuropathy per [facility].  On chronic steroids with resulting hyperglycemia prev.  Still with progressive weakness.  Fall precautions discussed.  Using a cane.    PMH and SH reviewed  Meds, vitals, and allergies reviewed.   ROS: See HPI.  Otherwise negative.    GEN: nad, alert and oriented HEENT: mucous membranes moist NECK: supple w/o  LA CV: rrr. PULM: ctab, no inc wob ABD: soft, +bs EXT: trace edema SKIN: no acute rash Slightly/diffuse muscle mass loss noted

## 2012-12-16 NOTE — Assessment & Plan Note (Signed)
Per [facility] 

## 2012-12-16 NOTE — Patient Instructions (Signed)
Go to the lab on the way out.  We'll contact you with your lab report. Take care.  Don't change your meds.  Glad to see you.  Let me know if you have questions or concerns.

## 2012-12-16 NOTE — Assessment & Plan Note (Signed)
See scanned forms.  Routine anticipatory guidance given to patient.  See health maintenance. Flu done today Shingles N/A due to steroid use.  PNA 2008 Tetanus 2012 Colonoscopy 2008 Prostate cancer screening and PSA options (with potential risks and benefits of testing vs not testing) were discussed along with recent recs/guidelines.  He declined testing PSA at this point. Advance directive d/w pt.  Wife would be designated if incapacitated.  Cognitive function addressed- see scanned forms- and if abnormal then additional documentation follows.

## 2012-12-16 NOTE — Assessment & Plan Note (Signed)
Controlled, continue current med.  See notes on labs.

## 2012-12-19 ENCOUNTER — Encounter: Payer: Self-pay | Admitting: *Deleted

## 2013-01-11 ENCOUNTER — Telehealth: Payer: Self-pay | Admitting: Family Medicine

## 2013-05-07 ENCOUNTER — Other Ambulatory Visit: Payer: Self-pay | Admitting: Family Medicine

## 2013-08-11 ENCOUNTER — Ambulatory Visit (INDEPENDENT_AMBULATORY_CARE_PROVIDER_SITE_OTHER): Payer: Medicare Other | Admitting: Family Medicine

## 2013-08-11 ENCOUNTER — Encounter: Payer: Self-pay | Admitting: Family Medicine

## 2013-08-11 ENCOUNTER — Encounter (INDEPENDENT_AMBULATORY_CARE_PROVIDER_SITE_OTHER): Payer: Self-pay

## 2013-08-11 VITALS — BP 120/72 | HR 73 | Temp 97.7°F | Wt 225.5 lb

## 2013-08-11 DIAGNOSIS — Z79899 Other long term (current) drug therapy: Secondary | ICD-10-CM

## 2013-08-11 DIAGNOSIS — R7989 Other specified abnormal findings of blood chemistry: Secondary | ICD-10-CM

## 2013-08-11 DIAGNOSIS — IMO0002 Reserved for concepts with insufficient information to code with codable children: Secondary | ICD-10-CM

## 2013-08-11 DIAGNOSIS — R945 Abnormal results of liver function studies: Secondary | ICD-10-CM

## 2013-08-11 DIAGNOSIS — R609 Edema, unspecified: Secondary | ICD-10-CM

## 2013-08-11 LAB — HEPATIC FUNCTION PANEL
ALBUMIN: 4 g/dL (ref 3.5–5.2)
ALT: 83 U/L — ABNORMAL HIGH (ref 0–53)
AST: 62 U/L — ABNORMAL HIGH (ref 0–37)
Alkaline Phosphatase: 84 U/L (ref 39–117)
Bilirubin, Direct: 0.2 mg/dL (ref 0.0–0.3)
Total Bilirubin: 1.1 mg/dL (ref 0.2–1.2)
Total Protein: 6.7 g/dL (ref 6.0–8.3)

## 2013-08-11 NOTE — Patient Instructions (Signed)
Go to the lab on the way out.  We'll contact you with your lab report. [name] will call about your referral. Try to keep you legs elevated and consider the compression stockings in the meantime.  Take care.

## 2013-08-11 NOTE — Progress Notes (Signed)
Pre visit review using our clinic review tool, if applicable. No additional management support is needed unless otherwise documented below in the visit note.  Legs are weaker, more trouble getting out of a chair, his stamina is decreased, gradually.  More trouble with steps.  He had f/u with [facility] in the meantime. On prednisone.  Due for DXA.  D/w pt.   BLE edema.  Variable dependent.  Bilateral.  Not SOB.  Less with elevation.  Not in sockings.  Compliant with meds.    LFT elevation note.  CK not elevated.  Prev hep serologies wnl at [facility], reviewed.  D/w pt. No abd pain, no RUQ pain.   PMH and SH reviewed  ROS: See HPI, otherwise noncontributory.  Meds, vitals, and allergies reviewed.   nad ncat Mmm rrr ctab abd soft, not ttp, no RUQ pain Ext with 1+ BLE edema with chronic skin changes noted on the shins.

## 2013-08-13 ENCOUNTER — Encounter: Payer: Self-pay | Admitting: Family Medicine

## 2013-08-13 DIAGNOSIS — R7989 Other specified abnormal findings of blood chemistry: Secondary | ICD-10-CM | POA: Insufficient documentation

## 2013-08-13 DIAGNOSIS — R945 Abnormal results of liver function studies: Secondary | ICD-10-CM

## 2013-08-13 DIAGNOSIS — R609 Edema, unspecified: Secondary | ICD-10-CM | POA: Insufficient documentation

## 2013-08-13 NOTE — Assessment & Plan Note (Signed)
Refer for dxa.  On calcium and vit D already.

## 2013-08-13 NOTE — Assessment & Plan Note (Addendum)
Likely multifactorial, dependent with some likely venous insufficiency.  Doesn't appear to be in failure.  Ctab. D/w pt.  Would elevate as tolerated and consider stockings.  Less edema today than at eval at [facility] per patient.

## 2013-08-13 NOTE — Assessment & Plan Note (Signed)
Would check ABD u/s, prev hep serologies neg.  Mild transaminitis. D/w pt about possible fatty deposits in liver.  See notes on labs.

## 2013-08-15 ENCOUNTER — Ambulatory Visit: Payer: Medicare Other | Admitting: Family Medicine

## 2013-08-15 ENCOUNTER — Ambulatory Visit: Payer: Self-pay | Admitting: Family Medicine

## 2013-08-15 ENCOUNTER — Encounter: Payer: Self-pay | Admitting: Family Medicine

## 2013-08-16 ENCOUNTER — Encounter: Payer: Self-pay | Admitting: Family Medicine

## 2013-08-16 ENCOUNTER — Other Ambulatory Visit: Payer: Self-pay | Admitting: Family Medicine

## 2013-08-16 DIAGNOSIS — R740 Nonspecific elevation of levels of transaminase and lactic acid dehydrogenase [LDH]: Principal | ICD-10-CM

## 2013-08-16 DIAGNOSIS — R7401 Elevation of levels of liver transaminase levels: Secondary | ICD-10-CM

## 2013-08-23 ENCOUNTER — Ambulatory Visit: Payer: Medicare Other | Admitting: Family Medicine

## 2013-08-29 ENCOUNTER — Ambulatory Visit: Payer: Self-pay | Admitting: Family Medicine

## 2013-08-29 ENCOUNTER — Encounter: Payer: Self-pay | Admitting: Family Medicine

## 2013-09-19 ENCOUNTER — Ambulatory Visit: Payer: Medicare Other | Admitting: Family Medicine

## 2014-02-13 ENCOUNTER — Other Ambulatory Visit: Payer: Self-pay | Admitting: Family Medicine

## 2014-02-14 ENCOUNTER — Other Ambulatory Visit (INDEPENDENT_AMBULATORY_CARE_PROVIDER_SITE_OTHER): Payer: Medicare Other

## 2014-02-14 DIAGNOSIS — R7401 Elevation of levels of liver transaminase levels: Secondary | ICD-10-CM

## 2014-02-14 DIAGNOSIS — R740 Nonspecific elevation of levels of transaminase and lactic acid dehydrogenase [LDH]: Secondary | ICD-10-CM

## 2014-02-14 LAB — HEPATIC FUNCTION PANEL
ALBUMIN: 4 g/dL (ref 3.5–5.2)
ALK PHOS: 62 U/L (ref 39–117)
ALT: 59 U/L — ABNORMAL HIGH (ref 0–53)
AST: 40 U/L — AB (ref 0–37)
BILIRUBIN DIRECT: 0.2 mg/dL (ref 0.0–0.3)
TOTAL PROTEIN: 6.6 g/dL (ref 6.0–8.3)
Total Bilirubin: 1 mg/dL (ref 0.2–1.2)

## 2014-02-20 ENCOUNTER — Other Ambulatory Visit: Payer: Medicare Other

## 2014-07-17 ENCOUNTER — Ambulatory Visit (INDEPENDENT_AMBULATORY_CARE_PROVIDER_SITE_OTHER)
Admission: RE | Admit: 2014-07-17 | Discharge: 2014-07-17 | Disposition: A | Discharge status: Home / Self Care | Payer: Medicare Other | Source: Ambulatory Visit | Attending: Family Medicine | Admitting: Family Medicine

## 2014-07-17 ENCOUNTER — Ambulatory Visit (INDEPENDENT_AMBULATORY_CARE_PROVIDER_SITE_OTHER): Payer: Medicare Other | Admitting: Family Medicine

## 2014-07-17 ENCOUNTER — Encounter: Payer: Self-pay | Admitting: Family Medicine

## 2014-07-17 ENCOUNTER — Telehealth: Payer: Self-pay | Admitting: Family Medicine

## 2014-07-17 VITALS — BP 126/76 | HR 75 | Temp 98.5°F | Wt 228.0 lb

## 2014-07-17 DIAGNOSIS — R51 Headache: Secondary | ICD-10-CM

## 2014-07-17 DIAGNOSIS — R519 Headache, unspecified: Secondary | ICD-10-CM

## 2014-07-17 LAB — COMPREHENSIVE METABOLIC PANEL
ALT: 49 U/L (ref 0–53)
AST: 36 U/L (ref 0–37)
Albumin: 4.1 g/dL (ref 3.5–5.2)
Alkaline Phosphatase: 63 U/L (ref 39–117)
BILIRUBIN TOTAL: 0.9 mg/dL (ref 0.2–1.2)
BUN: 13 mg/dL (ref 6–23)
CO2: 30 meq/L (ref 19–32)
CREATININE: 1.11 mg/dL (ref 0.40–1.50)
Calcium: 9.1 mg/dL (ref 8.4–10.5)
Chloride: 104 mEq/L (ref 96–112)
GFR: 68.78 mL/min (ref >60.00)
GLUCOSE: 102 mg/dL — AB (ref 70–99)
Potassium: 4.3 mEq/L (ref 3.5–5.1)
Sodium: 138 mEq/L (ref 135–145)
TOTAL PROTEIN: 6.4 g/dL (ref 6.0–8.3)

## 2014-07-17 LAB — SEDIMENTATION RATE: SED RATE: 4 mm/h (ref 0–22)

## 2014-07-17 LAB — CBC WITH DIFFERENTIAL/PLATELET
BASOS PCT: 0.4 % (ref 0.0–3.0)
Basophils Absolute: 0 10*3/uL (ref 0.0–0.1)
EOS ABS: 0.2 10*3/uL (ref 0.0–0.7)
Eosinophils Relative: 2.1 % (ref 0.0–5.0)
HCT: 43.9 % (ref 39.0–52.0)
Hemoglobin: 14.5 g/dL (ref 13.0–17.0)
LYMPHS ABS: 3 10*3/uL (ref 0.7–4.0)
Lymphocytes Relative: 35.3 % (ref 12.0–46.0)
MCHC: 33.1 g/dL (ref 30.0–36.0)
MCV: 103 fl — AB (ref 78.0–100.0)
MONO ABS: 0.7 10*3/uL (ref 0.1–1.0)
Monocytes Relative: 8.2 % (ref 3.0–12.0)
NEUTROS PCT: 54 % (ref 43.0–77.0)
Neutro Abs: 4.7 10*3/uL (ref 1.4–7.7)
PLATELETS: 270 10*3/uL (ref 150.0–400.0)
RBC: 4.26 Mil/uL (ref 4.22–5.81)
RDW: 15.6 % — ABNORMAL HIGH (ref 11.5–15.5)
WBC: 8.6 10*3/uL (ref 4.0–10.5)

## 2014-07-17 NOTE — Telephone Encounter (Signed)
Noted.  Thanks.   I cancelled the neuro referral and put in the order for the CT head.   When we get the labs and the imaging resulted, we'll go from there.  Thanks to all involved.

## 2014-07-17 NOTE — Telephone Encounter (Signed)
Faxed office note to Dr Tillman Abide at [facility] the Neurologist that patient currently sees. He doesn't see patients with headaches nor Trigeminal Neuralgia. Butch Penny, Dr Ok Edwards assistant called back after Dr Martie Lee reviewied the note and he would recommend a CT Head no contrast. He said if CT Head comes out clear he would consider Trigeminal Neuralgia as possible diagnosis. The Neurosurgery Dept is the Dept that sees patients at [facility] for Trigeminal Neuralgia if you want them to see him after the CT.

## 2014-07-17 NOTE — Patient Instructions (Signed)
Go to the lab on the way out.  We'll contact you with your lab report. [name] will call about your referral. I think you may have trigeminal neuralgia and I need neurology input.   Take care.  Glad to see you.

## 2014-07-17 NOTE — Progress Notes (Signed)
Pre visit review using our clinic review tool, if applicable. No additional management support is needed unless otherwise documented below in the visit note.  R sided HA.  Woke up Sunday PM with HA.  Persistent in meantime.  R side of face/forehead and lateral scalp.  Stops at the midline, no L sided sx.  Sore to the touch.  No trigger, no trauma.  Activity at baseline.  No motor/sensation changes from baseline in the ext.  Taste wnl.  No change in smell.  No FCNAVD.  Hot show helps some with the pain.  mucinex didn't help.   No vision loss. No rash.   On prednisone 10mg  at baseline.    nad ncat Tm wnl PERRL, EOMI OP wnl Nasal exam wnl Neck supple rrr ctab CN 2-12 wnl B, S/S/DTR wnl x4 (with his chronic motor changes noted, but no acute changes) R V1-3 sensitive but no motor changes on the face, no rash.

## 2014-07-18 DIAGNOSIS — B029 Zoster without complications: Secondary | ICD-10-CM | POA: Insufficient documentation

## 2014-07-18 NOTE — Assessment & Plan Note (Signed)
Concern for trigeminal neuralgia.  See CT report and labs.  D/w pt about ddx.  TA not likely, not likely to be a sinus infection.  Not likely related to other ongoing chronic issues.  >25 minutes spent in face to face time with patient, >50% spent in counselling or coordination of care.  No meds rx'd at time of OV.

## 2014-07-19 ENCOUNTER — Encounter: Payer: Self-pay | Admitting: Family Medicine

## 2014-07-19 ENCOUNTER — Ambulatory Visit (INDEPENDENT_AMBULATORY_CARE_PROVIDER_SITE_OTHER): Payer: Medicare Other | Admitting: Family Medicine

## 2014-07-19 VITALS — BP 132/84 | HR 69 | Temp 98.6°F | Wt 228.0 lb

## 2014-07-19 DIAGNOSIS — B029 Zoster without complications: Secondary | ICD-10-CM | POA: Diagnosis not present

## 2014-07-19 MED ORDER — VALACYCLOVIR HCL 1 G PO TABS: 1000.0000 mg | ORAL_TABLET | Freq: Three times a day (TID) | ORAL | Status: DC

## 2014-07-19 MED ORDER — GABAPENTIN 100 MG PO CAPS: 100.0000 mg | ORAL_CAPSULE | Freq: Three times a day (TID) | ORAL | Status: DC

## 2014-07-19 NOTE — Patient Instructions (Signed)
Shingles.  You don't need neurosurgery eval.  Start valtrex today.  Take it 3 times a day.  Start gabapentin.  Take 1 pill 3 times a day, sedation caution.  If needed, increase to 2 tabs 3 times a day.  Update me as needed.  I want you to be seen at [facility] clinic tomorrow.  I called about getting you an appointment and they'll call you back.  Take care.  Glad to see you.

## 2014-07-19 NOTE — Progress Notes (Signed)
Pre visit review using our clinic review tool, if applicable. No additional management support is needed unless otherwise documented below in the visit note.  R CNV1 rash with blistering and pain.  Pain is worse now.  Stops at midline.    Meds, vitals, and allergies reviewed.   ROS: See HPI.  Otherwise, noncontributory.  nad but uncomfortable.  R CNV1 rash with blistering and change in sensation.

## 2014-07-20 ENCOUNTER — Encounter: Payer: Self-pay | Admitting: *Deleted

## 2014-07-20 NOTE — Assessment & Plan Note (Signed)
Start valtrex, up taper gabapentin, will f/u with eye clinic tomorrow.  App help of all involved.  Plan and dx d/w pt.

## 2014-07-24 ENCOUNTER — Telehealth: Payer: Self-pay

## 2014-07-24 NOTE — Telephone Encounter (Signed)
Called pt.  He has some pain relief with gabapentin and has f/u with eye clinic pending for later this week.  He thanked me for the call.

## 2014-07-24 NOTE — Telephone Encounter (Signed)
[name] left v/m as an [name]; pt was seen 07/19/14 by Dr Damita Dunnings with shingles; pt saw eye doctor on 07/20/14 and shingles had not effected eye; pt was rechecked by eye dr on 07/23/14 and shingles have effected the cornea. [name] does not need cb just FYI for Dr Damita Dunnings.

## 2014-08-08 ENCOUNTER — Telehealth: Payer: Self-pay | Admitting: Family Medicine

## 2014-08-08 NOTE — Telephone Encounter (Signed)
Patient notified as instructed by telephone and verbalized understanding. Patient stated that he is taking Gabapentin 100 mg three times and is picking up a refill on that today.  Patient stated that he thinks that the Gabapentin is helping, but does not know how it would be if he was not taking it. Patient stated that he has his physical scheduled next week with you. [facility]

## 2014-08-08 NOTE — Telephone Encounter (Signed)
Patient notified as instructed by telephone and verbalized understanding. 

## 2014-08-08 NOTE — Telephone Encounter (Signed)
I would try gradually working up to 200mg  tid prn if needed for pain (ie 2-1-1, then 2-2-1, then 2-2-2).  See if that helps and I'll be glad to see him next week.  Thanks.

## 2014-08-08 NOTE — Telephone Encounter (Signed)
Please see what detail you can get.   In terms of the eye sx- ie scratchy feeling, etc- the eye clinic would have the best input on that.  For the facial pain, I can likely help with meds.  How is the gabapentin doing?  How much is he taking?  Can he tell any difference from that? Let me know and we'll go from there.  Thanks.

## 2014-08-08 NOTE — Telephone Encounter (Signed)
Pt went to eye dr and shingles are in eye.  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  Eye Dr wanted you to be updated and wife is wanting to ask you if you have any advice on how to help him feel better. Please call wife's cell phone if you have any questions.

## 2014-08-10 ENCOUNTER — Other Ambulatory Visit: Payer: Self-pay | Admitting: Family Medicine

## 2014-08-12 ENCOUNTER — Other Ambulatory Visit: Payer: Self-pay | Admitting: Family Medicine

## 2014-08-12 DIAGNOSIS — I10 Essential (primary) hypertension: Secondary | ICD-10-CM

## 2014-08-13 ENCOUNTER — Telehealth: Payer: Self-pay | Admitting: *Deleted

## 2014-08-13 ENCOUNTER — Other Ambulatory Visit (INDEPENDENT_AMBULATORY_CARE_PROVIDER_SITE_OTHER): Payer: Medicare Other

## 2014-08-13 DIAGNOSIS — I10 Essential (primary) hypertension: Secondary | ICD-10-CM | POA: Diagnosis not present

## 2014-08-13 LAB — LIPID PANEL
CHOL/HDL RATIO: 5
CHOLESTEROL: 160 mg/dL (ref 0–200)
HDL: 31.8 mg/dL — AB (ref >39.00)
LDL Cholesterol: 91 mg/dL (ref 0–99)
NonHDL: 128.2
Triglycerides: 187 mg/dL — ABNORMAL HIGH (ref 0.0–149.0)
VLDL: 37.4 mg/dL (ref 0.0–40.0)

## 2014-08-13 NOTE — Telephone Encounter (Signed)
An he can go up more on the gabapentin if tolerated in the meantime.  Can take up to 300mg  at a time, 3 times a day.  I didn't have him up the dose more prev- I didn't know he was in that much pain.  Thanks.

## 2014-08-13 NOTE — Telephone Encounter (Signed)
Patient was in earlier this morning for lab work prior to CPE on Thursday.  Wife states that patient is still in a lot of pain and doesn't know how much more he can stand.  Wife was asking if they could talk to Dr. Damita Dunnings about this on Thursday along with the physical exam.  I assured her that they could but that if he needs to see Dr. Damita Dunnings sooner than that, please call in for an appointment.

## 2014-08-13 NOTE — Telephone Encounter (Signed)
Wife advised. 

## 2014-08-16 ENCOUNTER — Ambulatory Visit (INDEPENDENT_AMBULATORY_CARE_PROVIDER_SITE_OTHER): Payer: Medicare Other | Admitting: Family Medicine

## 2014-08-16 ENCOUNTER — Encounter: Payer: Self-pay | Admitting: Family Medicine

## 2014-08-16 VITALS — BP 108/68 | HR 88 | Temp 98.3°F | Ht 77.0 in | Wt 223.5 lb

## 2014-08-16 DIAGNOSIS — Z0000 Encounter for general adult medical examination without abnormal findings: Secondary | ICD-10-CM

## 2014-08-16 DIAGNOSIS — Z7189 Other specified counseling: Secondary | ICD-10-CM

## 2014-08-16 DIAGNOSIS — B029 Zoster without complications: Secondary | ICD-10-CM

## 2014-08-16 MED ORDER — GABAPENTIN 300 MG PO CAPS: 300.0000 mg | ORAL_CAPSULE | Freq: Three times a day (TID) | ORAL | Status: DC | PRN

## 2014-08-16 NOTE — Patient Instructions (Signed)
I would get a flu shot each fall.   We can get you the other pneumonia shot later on (prevnar, or pneumonia-13) Try going on up on the gabapentin gradually.  Take care.  Update me as needed.  Glad to see you.

## 2014-08-16 NOTE — Progress Notes (Addendum)
Pre visit review using our clinic review tool, if applicable. No additional management support is needed unless otherwise documented below in the visit note.  I have personally reviewed the Medicare Annual Wellness questionnaire and have noted 1. The patient's medical and social history 2. Their use of alcohol, tobacco or illicit drugs 3. Their current medications and supplements 4. The patient's functional ability including ADL's, fall risks, home safety risks and hearing or visual             impairment. 5. Diet and physical activities 6. Evidence for depression or mood disorders  The patients weight, height, BMI have been recorded in the chart and visual acuity is per eye clinic.  I have made referrals, counseling and provided education to the patient based review of the above and I have provided the pt with a written personalized care plan for preventive services.  Provider list updated- see scanned forms.  Routine anticipatory guidance given to patient.  See health maintenance.  Flu 2015 Shingles NA due to ongoing steroid use PNA 2008, defer update for now, d/w pt.   Tetanus 2012 Colonoscopy 2008 Prostate cancer screening and PSA options (with potential risks and benefits of testing vs not testing) were discussed along with recent recs/guidelines.  He declined testing PSA at this point. Advance directive- wife designated if patient were incapacitated.  Cognitive function addressed- see scanned forms- and if abnormal then additional documentation follows.   R face and eye shingles.  Has f/u with eye clinic pending.  Still with pain, tearing and pain in the eye.  No vision loss.  Occ sharp episodes of pain, worse than baseline, but those are less frequent on higher dose of gabapentin.  No ADE on med.  Light sensitive, using sunglasses prn.   He has routine f/u with [facility] yearly re: his neuromuscular dx.   PMH and SH reviewed  Meds, vitals, and allergies reviewed.   ROS: See HPI.   Otherwise negative.    GEN: nad, alert and oriented, R eye with more tearing, no purulent discharge.  R conjunctiva irritated.  HEENT: mucous membranes moist NECK: supple w/o LA CV: rrr. PULM: ctab, no inc wob ABD: soft, +bs EXT: 1+ BLE edema SKIN: no acute rash Walking with a cane, at baseline.  He is unable to walk long distances due to leg weakness  Patient suffers from leg weakness which impairs their ability to perform daily activities like walking in the home.  A cane or walker will not resolve  issue with performing activities of daily living due to leg weakness. A wheelchair will allow patient to safely perform daily activities. Patient can safely propel the wheelchair in the home or has a caregiver who can provide assistance.  Accessories: elevating leg rests (ELRs), wheel locks, extensions and anti-tippers.

## 2014-08-17 DIAGNOSIS — Z7189 Other specified counseling: Secondary | ICD-10-CM | POA: Insufficient documentation

## 2014-08-17 NOTE — Assessment & Plan Note (Signed)
Flu 2015 Shingles NA due to ongoing steroid use PNA 2008, defer update for now, d/w pt.   Tetanus 2012 Colonoscopy 2008 Prostate cancer screening and PSA options (with potential risks and benefits of testing vs not testing) were discussed along with recent recs/guidelines.  He declined testing PSA at this point. Advance directive- wife designated if patient were incapacitated.  Cognitive function addressed- see scanned forms- and if abnormal then additional documentation follows.

## 2014-08-17 NOTE — Assessment & Plan Note (Signed)
Can gradually inc gabapentin up to 600mg  tid, with routine cautions.  He'll keep f/u with eye clinic as scheduled.

## 2014-08-24 ENCOUNTER — Telehealth: Payer: Self-pay | Admitting: Family Medicine

## 2014-08-24 MED ORDER — GABAPENTIN 300 MG PO CAPS: 600.0000 mg | ORAL_CAPSULE | Freq: Three times a day (TID) | ORAL | Status: DC | PRN

## 2014-08-24 NOTE — Telephone Encounter (Signed)
Can gradually up the dose to 3 tabs (900mg ) tid.  Thanks.

## 2014-08-24 NOTE — Telephone Encounter (Signed)
Patient advised.

## 2014-08-24 NOTE — Telephone Encounter (Signed)
kathyrn called to let you know  Mr Guttman is 2 tablets 3 times a day of gabapentin He is doing somewhat better it still bothers him They wanted to know if you want to up the dosage again

## 2014-09-03 ENCOUNTER — Telehealth: Payer: Self-pay | Admitting: Family Medicine

## 2014-09-03 NOTE — Telephone Encounter (Signed)
[name] has not done anything now for 50 days and he has been sleeping a lot.  His stamina is very weak,  taking Gabapentin 300 mg tablet, taking 2 in the morning,3 at noon and ,3 in the evening.  Family is trying to get him out of the house some and motivated.  His granddaughter will be playing volleyball and he wants to go to those games but it is sometimes a long ways to walk and they are concerned about him getting weak and falling.  Could he get a Rx for a wheelchair to transport him through the parking lot and then walk some on his own inside the gym?

## 2014-09-03 NOTE — Telephone Encounter (Signed)
Patient's wife called and asked for [place] to call her back.

## 2014-09-03 NOTE — Telephone Encounter (Signed)
rx done.  What about PT also?  Will he agree to that? Thanks.  Let me know.

## 2014-09-04 NOTE — Telephone Encounter (Addendum)
Wife advised.  Rx left at front desk for pick up.  

## 2014-09-14 ENCOUNTER — Other Ambulatory Visit: Payer: Self-pay | Admitting: Family Medicine

## 2014-09-14 ENCOUNTER — Telehealth: Payer: Self-pay | Admitting: Family Medicine

## 2014-09-14 DIAGNOSIS — R29898 Other symptoms and signs involving the musculoskeletal system: Secondary | ICD-10-CM

## 2014-09-14 NOTE — Telephone Encounter (Signed)
Wife is calling stating regarding wheelchair. She was told by [facility] that more information is needed.  A medical necessity explanation is needed.  Wife would like letter faxed if possible. Fax number is [phone] cb number for wife is [phone] home or [phone]

## 2014-09-14 NOTE — Telephone Encounter (Signed)
Order is pending in EMR, please complete that order and I think this will answer the questions that [place] has.

## 2014-09-14 NOTE — Progress Notes (Unsigned)
Note and order done.  Thanks.

## 2014-10-10 ENCOUNTER — Other Ambulatory Visit: Payer: Self-pay

## 2014-10-10 MED ORDER — GABAPENTIN 300 MG PO CAPS: 600.0000 mg | ORAL_CAPSULE | Freq: Three times a day (TID) | ORAL | Status: DC | PRN

## 2014-10-10 NOTE — Telephone Encounter (Signed)
Thanks

## 2014-10-10 NOTE — Telephone Encounter (Signed)
Patient notified as instructed by telephone and verbalized understanding. Patient stated that the last time he got the script filled it was for #180. Patient stated that he was taking 3 caps three times a day and that dose was relieving his pain.. Patient stated that he backed down to 2 caps three times a day and his pain increased. Patient stated that the medication works well for him if he is able to take the 3 caps three times a day and he tolerates that dose.  Advised patient that a new script has been sent him that will allow him to take up to 3 caps three times a day.

## 2014-10-10 NOTE — Telephone Encounter (Signed)
Sent.  He can still inc dose if needed- is he still in enough pain to need to do that?  Does he think he can tolerate the higher dose?  How effective overall is the medicine? Thanks.

## 2014-10-10 NOTE — Telephone Encounter (Signed)
Mrs Oquendo said Pt is taking gabapentin 300 mg 2 - 3 caps tid as instructed. The last rx at pharmacy has instructions 1-2 caps tid. Pt is still having pain in head and wants to get refill of gabapentin with new instructions to [facility].

## 2014-12-11 ENCOUNTER — Other Ambulatory Visit: Payer: Self-pay | Admitting: Family Medicine

## 2014-12-11 NOTE — Telephone Encounter (Signed)
Gabapentin last refilled 10/10/14 for #270 with 1 refill. Ok to refill this medication?

## 2014-12-12 NOTE — Telephone Encounter (Signed)
Spoke to patient and was advised that he is still having swelling above his eye and right along his hair line and into his hair. Patient stated that he was thinking about coming in and having you look at him and see what else can be done.  Patient stated that his eye doctor thinks things are looking good with his eye. Patient stated that he has to see his eye doctor again in a month.  Patient stated that the only time that he is not having pain, stinging and tingling is when he is asleep.  Patient requested a call back to let him know if he should come in again.

## 2014-12-12 NOTE — Telephone Encounter (Signed)
I'd like to see him when possible.  We can try to make some plans at the [place]. Thanks.

## 2014-12-12 NOTE — Telephone Encounter (Signed)
Patient notified as instructed by telephone and verbalized understanding. Appointment scheduled as instructed.

## 2014-12-12 NOTE — Telephone Encounter (Signed)
Sent.  How is patient doing?  Thanks.

## 2014-12-17 ENCOUNTER — Ambulatory Visit (INDEPENDENT_AMBULATORY_CARE_PROVIDER_SITE_OTHER): Payer: Medicare Other | Admitting: Family Medicine

## 2014-12-17 ENCOUNTER — Encounter: Payer: Self-pay | Admitting: Family Medicine

## 2014-12-17 VITALS — BP 116/60 | HR 82 | Temp 97.5°F | Wt 239.0 lb

## 2014-12-17 DIAGNOSIS — B029 Zoster without complications: Secondary | ICD-10-CM | POA: Diagnosis not present

## 2014-12-17 NOTE — Progress Notes (Signed)
Pre visit review using our clinic review tool, if applicable. No additional management support is needed unless otherwise documented below in the visit note.  Still with R facial pain, along the R scalp.  Up to 900mg  gabapentin tid.  It helps some but still with some pain (local chills, pins and needles, burning) on R side of face and scalp.  It doesn't cross to the L side of the face.  It is less painful on the posterior scalp now, ie the distribution is contracted some from prev. He prev had some severe spikes in pain, with burning and stinging that would last for a few minutes.  Those episodes are resolved, none since July.    D/w pt about fall precautions.  He is using a w/c for long distances but walking with cane o/w.  He declined PT offer but is doing similar exercises at home.   Meds, vitals, and allergies reviewed.   ROS: See HPI.  Otherwise, noncontributory.  nad ncat R V1 and V2 sensitive to light touch with pain elicited.   No rash EOMI B

## 2014-12-18 NOTE — Assessment & Plan Note (Signed)
Pain is better, goal to minimize meds and still control pain.  Can try to taper med from 3 pills tid to 3-3-2, then 3-2-2, then 2-2-2. Dec in about 1 pill per day per week for slow taper.  Can up dose back to 3 tid if needed for return of pain.  Continue with home exercises and update me as needed.  He'll f/u with [facility] in 02/2015.  Okay for outpatient f/u.

## 2015-01-23 ENCOUNTER — Other Ambulatory Visit: Payer: Self-pay | Admitting: Family Medicine

## 2015-01-23 NOTE — Telephone Encounter (Signed)
Received refill request electronically. Spoke to [place] at the pharmacy and was advised that the patient got the last refill 01/10/15 #270. Is it okay to refill medication?

## 2015-01-23 NOTE — Telephone Encounter (Signed)
Sent, but please check with patient.  Thanks.

## 2015-02-09 ENCOUNTER — Other Ambulatory Visit: Payer: Self-pay | Admitting: Family Medicine

## 2015-04-07 ENCOUNTER — Other Ambulatory Visit: Payer: Self-pay | Admitting: Family Medicine

## 2015-04-08 NOTE — Telephone Encounter (Signed)
Electronic refill request. Last Filled:    270 capsule 1 01/23/2015  Last office visit:   12/17/14   Last CPE:  July 2016  Please advise.

## 2015-04-08 NOTE — Telephone Encounter (Signed)
Sent. Thanks.   

## 2015-06-06 ENCOUNTER — Other Ambulatory Visit: Payer: Self-pay | Admitting: Family Medicine

## 2015-06-06 NOTE — Telephone Encounter (Signed)
Electronic refill request. Last Filled:    270 capsule 1 04/08/2015  Last CPE:  08/16/14   Please advise.

## 2015-06-07 NOTE — Telephone Encounter (Signed)
Seen 11/16, okay to continue.   Schedule CPE summer or fall.  Sent.  Thanks.

## 2015-06-07 NOTE — Telephone Encounter (Signed)
Wife advised. 

## 2015-07-12 IMAGING — CT CT HEAD W/O CM
1 series · 16 of 30 positions shown, 20 images · non-contrast
Comparison: None available ; prior study on the time line from
08/05/2007 will not load

CLINICAL DATA: RIGHT-side temporal headache yesterday, history
hypertension, chronic inflammatory demyelinating polyneuropathy

EXAM:
CT HEAD WITHOUT CONTRAST
TECHNIQUE: Contiguous axial images were obtained from the base of the skull
through the vertex without intravenous contrast.

[Series 2: head_seq -c 4.5 h37s st · axial · 0.46mm/px · z∈[-104,+25]mm · 16 of 32 slices shown, 20 images]
[im 2/32  brain]
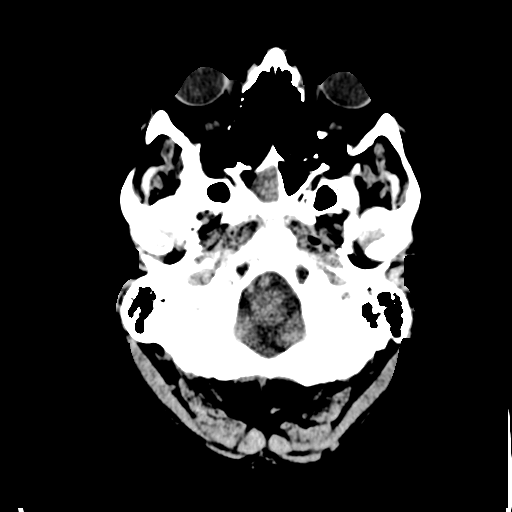
[im 2/32  bone]
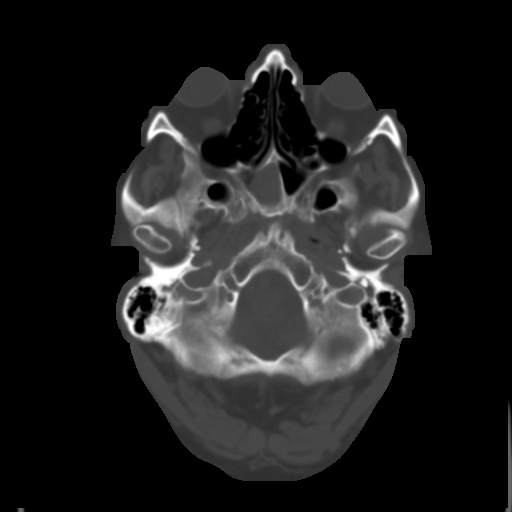
[im 4/32  brain]
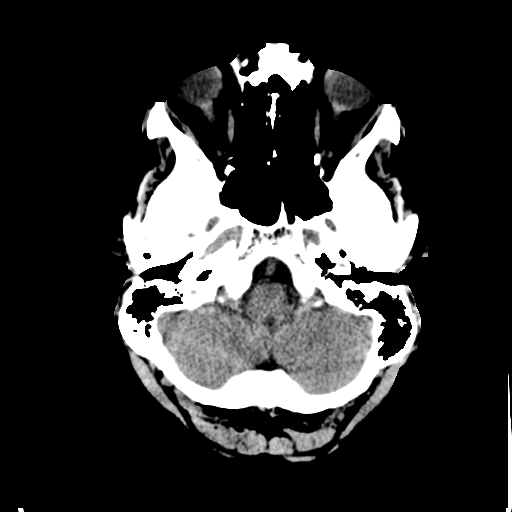
[im 6/32  brain]
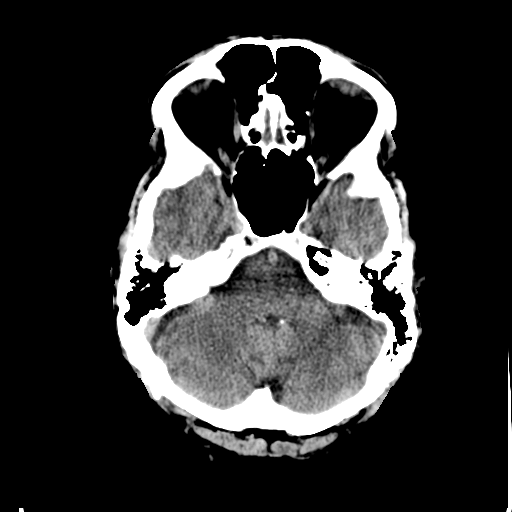
[im 8/32  brain]
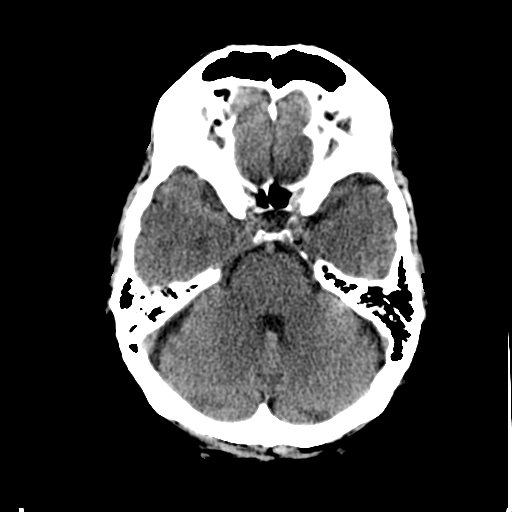
[im 9/32  brain]
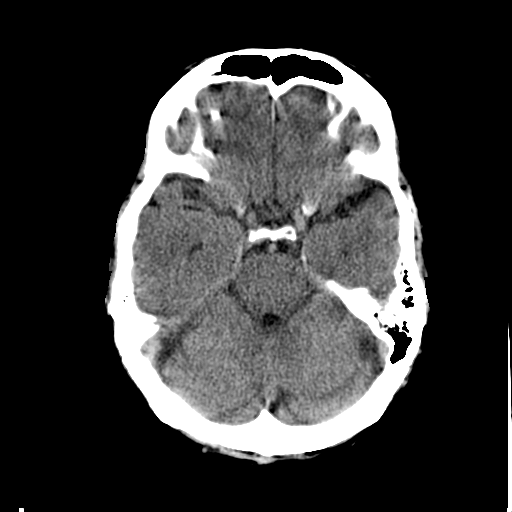
[im 9/32  bone]
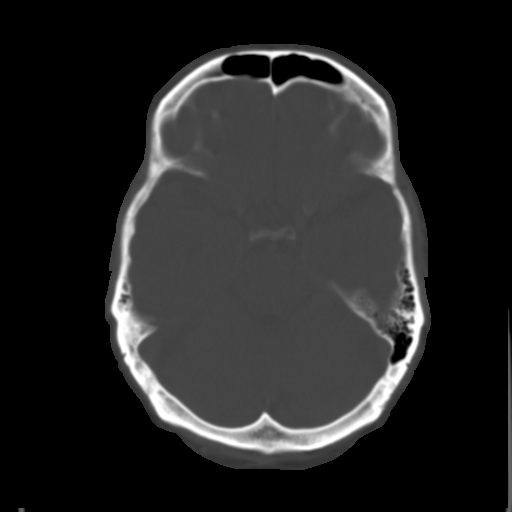
[im 11/32  brain]
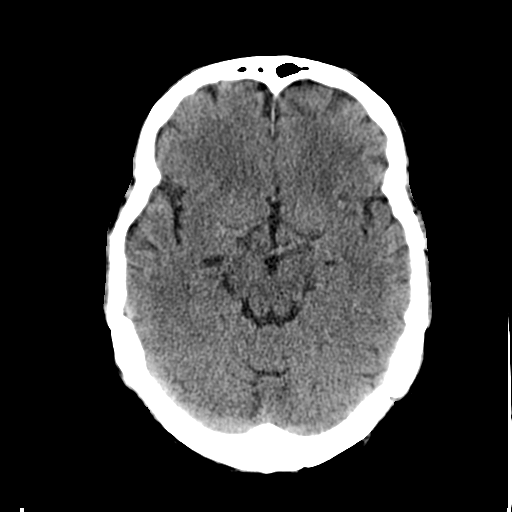
[im 13/32  brain]
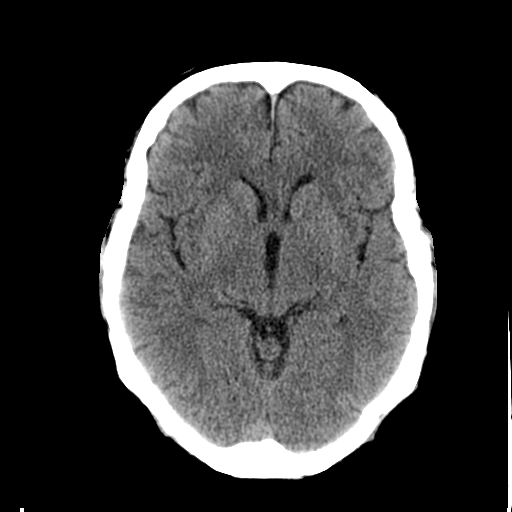
[im 15/32  brain]
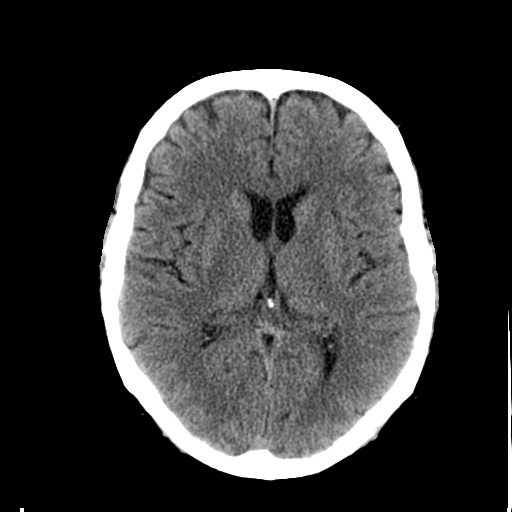
[im 17/32  brain]
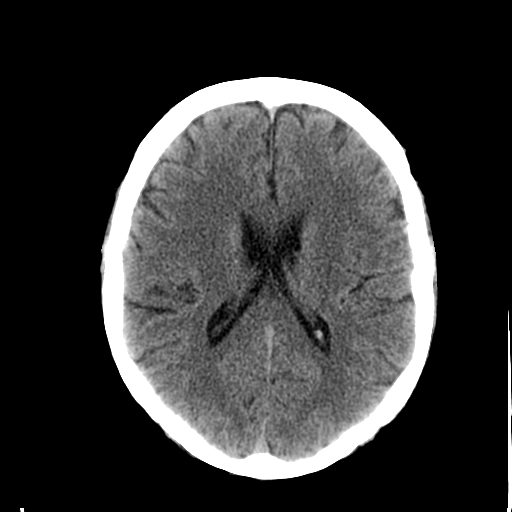
[im 17/32  bone]
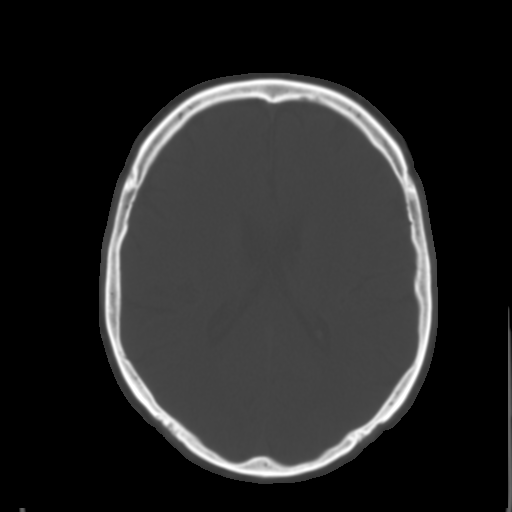
[im 19/32  brain]
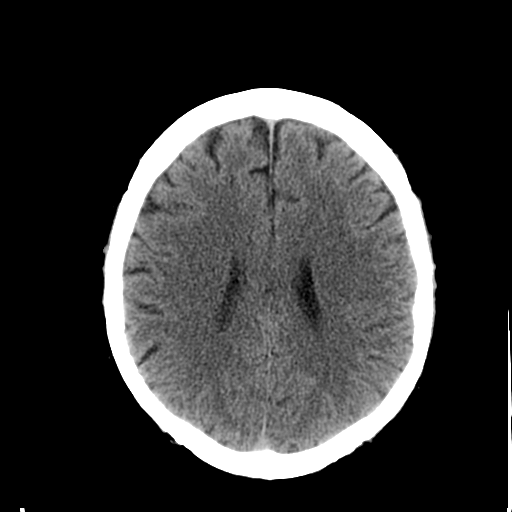
[im 21/32  brain]
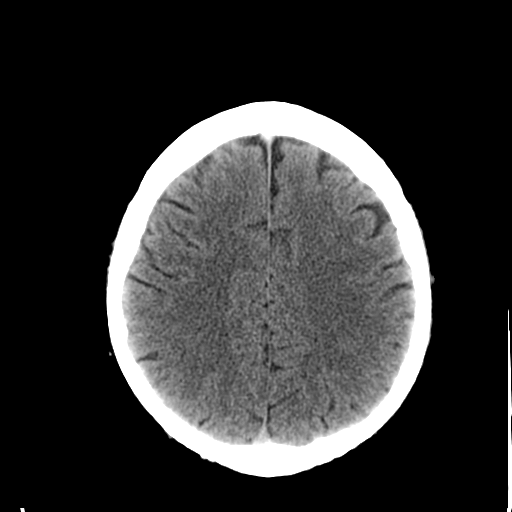
[im 23/32  brain]
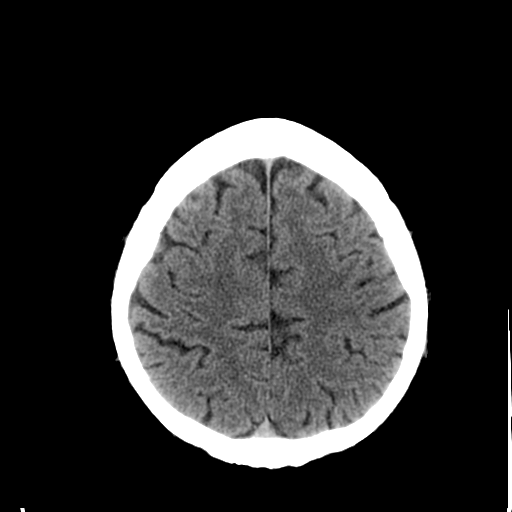
[im 24/32  brain]
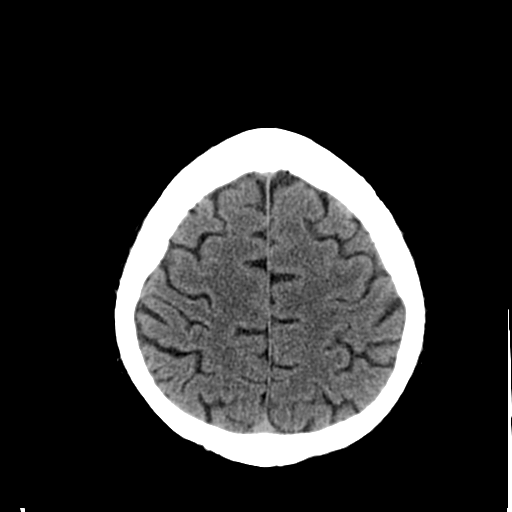
[im 24/32  bone]
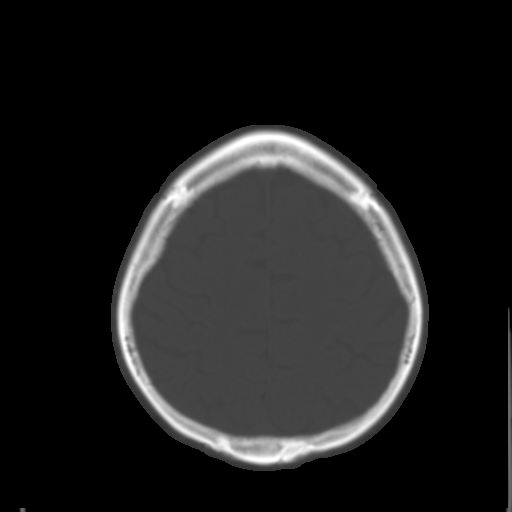
[im 26/32  brain]
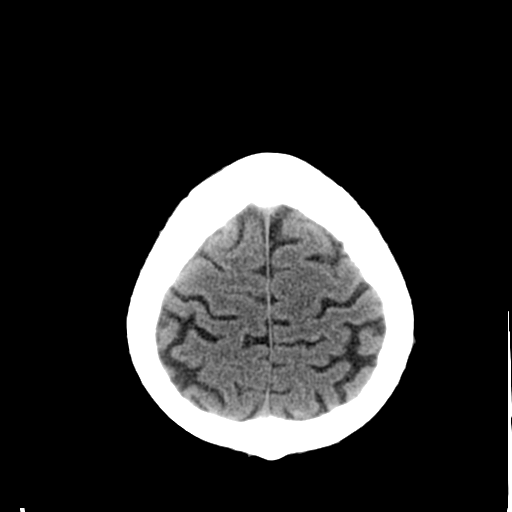
[im 28/32  brain]
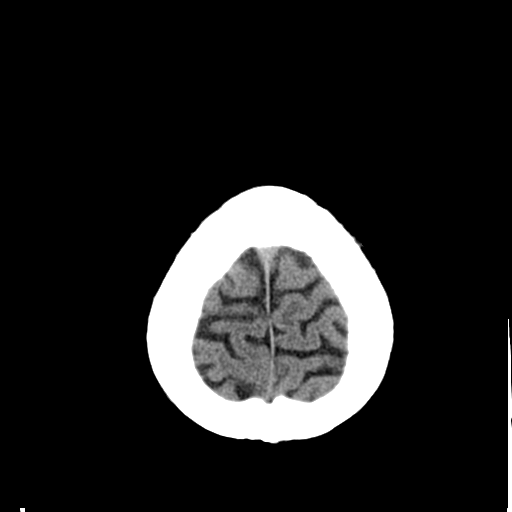
[im 30/32  brain]
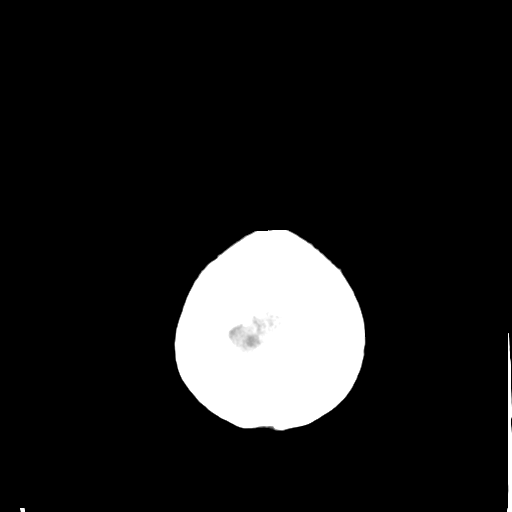

[16 of 30 positions shown; findings below may reference images not displayed]

FINDINGS: Minimal atrophy for age.

Normal ventricular morphology.

No midline shift or mass effect.

Otherwise normal appearance of brain parenchyma.

No intracranial hemorrhage, mass lesion, or evidence acute
infarction.

No extra-axial fluid collections.

Bones and sinuses unremarkable.
IMPRESSION: No acute intracranial abnormalities.

## 2015-08-05 ENCOUNTER — Other Ambulatory Visit: Payer: Self-pay | Admitting: *Deleted

## 2015-08-05 MED ORDER — LISINOPRIL 5 MG PO TABS
ORAL_TABLET | ORAL | Status: DC
Start: 2015-08-05 — End: 2015-12-06

## 2015-08-16 ENCOUNTER — Telehealth: Payer: Self-pay | Admitting: Family Medicine

## 2015-08-16 NOTE — Telephone Encounter (Signed)
Pt needs handicap form to be completed. Form placed in [facility]. Best number to reach patient is [phone]

## 2015-08-19 ENCOUNTER — Other Ambulatory Visit: Payer: Medicare Other

## 2015-08-19 ENCOUNTER — Ambulatory Visit: Payer: Medicare Other

## 2015-08-19 NOTE — Telephone Encounter (Signed)
I'll work on the hard copy.  Thanks.  

## 2015-08-27 ENCOUNTER — Encounter: Payer: Medicare Other | Admitting: Family Medicine

## 2015-09-23 ENCOUNTER — Other Ambulatory Visit: Payer: Self-pay | Admitting: Family Medicine

## 2015-09-23 DIAGNOSIS — R739 Hyperglycemia, unspecified: Secondary | ICD-10-CM

## 2015-09-23 DIAGNOSIS — I10 Essential (primary) hypertension: Secondary | ICD-10-CM

## 2015-09-23 DIAGNOSIS — T50905A Adverse effect of unspecified drugs, medicaments and biological substances, initial encounter: Principal | ICD-10-CM

## 2015-09-24 ENCOUNTER — Ambulatory Visit (INDEPENDENT_AMBULATORY_CARE_PROVIDER_SITE_OTHER): Payer: Medicare Other

## 2015-09-24 ENCOUNTER — Other Ambulatory Visit (INDEPENDENT_AMBULATORY_CARE_PROVIDER_SITE_OTHER): Payer: Medicare Other

## 2015-09-24 VITALS — BP 102/78 | HR 73 | Temp 98.0°F | Ht 76.0 in | Wt 229.8 lb

## 2015-09-24 DIAGNOSIS — Z0000 Encounter for general adult medical examination without abnormal findings: Secondary | ICD-10-CM

## 2015-09-24 DIAGNOSIS — R7989 Other specified abnormal findings of blood chemistry: Secondary | ICD-10-CM

## 2015-09-24 DIAGNOSIS — R739 Hyperglycemia, unspecified: Secondary | ICD-10-CM

## 2015-09-24 DIAGNOSIS — Z23 Encounter for immunization: Secondary | ICD-10-CM

## 2015-09-24 DIAGNOSIS — I10 Essential (primary) hypertension: Secondary | ICD-10-CM | POA: Diagnosis not present

## 2015-09-24 DIAGNOSIS — T50905A Adverse effect of unspecified drugs, medicaments and biological substances, initial encounter: Secondary | ICD-10-CM

## 2015-09-24 LAB — LIPID PANEL
Cholesterol: 131 mg/dL (ref 0–200)
HDL: 30.3 mg/dL — ABNORMAL LOW (ref >39.00)
NONHDL: 100.99
Total CHOL/HDL Ratio: 4
Triglycerides: 222 mg/dL — ABNORMAL HIGH (ref 0.0–149.0)
VLDL: 44.4 mg/dL — ABNORMAL HIGH (ref 0.0–40.0)

## 2015-09-24 LAB — COMPREHENSIVE METABOLIC PANEL
ALT: 47 U/L (ref 0–53)
AST: 37 U/L (ref 0–37)
Albumin: 3.6 g/dL (ref 3.5–5.2)
Alkaline Phosphatase: 65 U/L (ref 39–117)
BUN: 18 mg/dL (ref 6–23)
CHLORIDE: 107 meq/L (ref 96–112)
CO2: 28 mEq/L (ref 19–32)
CREATININE: 1.25 mg/dL (ref 0.40–1.50)
Calcium: 8.4 mg/dL (ref 8.4–10.5)
GFR: 59.78 mL/min — ABNORMAL LOW (ref >60.00)
Glucose, Bld: 100 mg/dL — ABNORMAL HIGH (ref 70–99)
Potassium: 3.9 mEq/L (ref 3.5–5.1)
SODIUM: 142 meq/L (ref 135–145)
Total Bilirubin: 0.7 mg/dL (ref 0.2–1.2)
Total Protein: 6 g/dL (ref 6.0–8.3)

## 2015-09-24 LAB — HEMOGLOBIN A1C: Hgb A1c MFr Bld: 6 % (ref 4.6–6.5)

## 2015-09-24 LAB — LDL CHOLESTEROL, DIRECT: Direct LDL: 71 mg/dL

## 2015-09-24 NOTE — Progress Notes (Signed)
PCP notes:   Health maintenance:  PCV13 - administered Flu vaccine - addressed  Abnormal screenings:   Fall risk - hx of fall without injury  Patient concerns:   None  Nurse concerns:  None  Next PCP appt:   10/01/2015 @ [id number]  I reviewed health advisor's note, was available for consultation on the day of service listed in this note, and agree with documentation and plan. Elsie Stain, MD.

## 2015-09-24 NOTE — Progress Notes (Signed)
Subjective:   [name] is a 75 y.o. male who presents for Medicare Annual/Subsequent preventive examination.  Review of Systems:  N/A Cardiac Risk Factors include: advanced age (>69men, >48 women);male gender;hypertension     Objective:    Vitals: BP 102/78 (BP Location: Left Arm, Patient Position: Sitting, Cuff Size: Normal)   Pulse 73   Temp 98 F (36.7 C) (Oral)   Ht 6\' 4"  (1.93 m) Comment: no shoes  Wt 229 lb 12 oz (104.2 kg)   SpO2 94%   BMI 27.97 kg/m   Body mass index is 27.97 kg/m.  Tobacco History  Smoking Status  . Never Smoker  Smokeless Tobacco  . Never Used     Counseling given: No   Past Medical History:  Diagnosis Date  . Cancer ([place])    skin cancer, resected by derm (Dr. Evorn Gong)  . Cholelithiasis 01/95   via abdominal ultrasound  . 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 Diverticulosis of colon 01/95  . Hyperglycemia    Related to chronic steroid use  . Hypertension 08/07  . Nephrolithiasis 1993   X 2  . Polyneuropathy ([facility])     Chronic inflammatory demylenating - Prednisone per [id number]  . Syncope 08/05/07   Presumed hypotension   Past Surgical History:  Procedure Laterality Date  . CHOLECYSTECTOMY  10/27/99   [facility] Done)  . DOPPLER ECHOCARDIOGRAPHY  08/01   T.R.; T.R.; P/R. borderline LVH  . ESOPHAGOGASTRODUODENOSCOPY  12/19/05   gastritis; [facility]  . HERNIA REPAIR  06/17/04   Laparoscopic, bilateral  . [place]  . LUMBAR DISC SURGERY  2000   L3/4 rupture  . nephrolithiasis     Family History  Problem Relation Age of Onset  . Hypertension Mother   . Heart disease Mother     MI  . Kidney disease Father     Kidney failure  . Parkinsonism Father   . Cancer Sister     Acute leukemia  . Arthritis Sister   . Diabetes Neg Hx   . Stroke Neg Hx   . Prostate cancer Neg Hx   . Colon cancer Neg Hx    History  Sexual Activity  . Sexual activity: No    Outpatient Encounter  Prescriptions as of 09/24/2015  Medication Sig  . aspirin 325 MG tablet Take 325 mg by mouth daily.    Marland Kitchen CALCIUM CARB-CHOLECALCIFEROL PO Take 1,250 mg by mouth daily.  . Cyanocobalamin (VITAMIN B-12) 500 MCG SUBL Place 1 tablet under the tongue daily.   Marland Kitchen econazole nitrate 1 % cream Apply 1 application topically daily as needed.  . gabapentin (NEURONTIN) 300 MG capsule TAKE 2-3 CAPSULES (600-900 MG TOTAL) BY MOUTH 3 (THREE) TIMES DAILY AS NEEDED. (Patient taking differently: TAKE 5 capsules daily.)  . ketoconazole (NIZORAL) 2 % shampoo Apply 1 application topically as needed.   Marland Kitchen lisinopril (PRINIVIL,ZESTRIL) 5 MG tablet TAKE 1 TABLET BY MOUTH ONCE A DAY AT NIGHT  . Multiple Vitamin (MULTIVITAMIN) tablet Take 1 tablet by mouth daily.    . predniSONE (DELTASONE) 10 MG tablet Take 10 mg by mouth daily with breakfast.  . timolol (TIMOPTIC) 0.5 % ophthalmic solution Place 1 drop into both eyes at bedtime. One drop in each eye at bedtime.  . triamcinolone (KENALOG) 0.025 % cream Apply 1 application topically as needed.  . [DISCONTINUED] Calcium 500 MG tablet Take 1,500 mg by mouth daily.  . [DISCONTINUED] prednisoLONE acetate (PRED FORTE) 1 % ophthalmic suspension Place  one drop in right eye every other day.   No facility-administered encounter medications on file as of 09/24/2015.     Activities of Daily Living In your present state of health, do you have any difficulty performing the following activities: 09/24/2015  Hearing? Y  Vision? Y  Difficulty concentrating or making decisions? N  Walking or climbing stairs? Y  Dressing or bathing? N  Doing errands, shopping? Y  Preparing Food and eating ? N  Using the Toilet? N  In the past six months, have you accidently leaked urine? N  Do you have problems with loss of bowel control? N  Managing your Medications? N  Managing your Finances? N  Housekeeping or managing your Housekeeping? 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 Sandra Cockayne  Exercise Activities and Dietary recommendations Current Exercise Habits: Home exercise routine, Type of exercise: stretching, Time (Minutes): 30, Frequency (Times/Week): 2, Weekly Exercise (Minutes/Week): 60, Intensity: Mild, Exercise limited by: neurologic condition(s)  Goals    . Increase physical activity          Starting 09/24/2015, I will continue to exercise at least 30 min twice weekly.       Fall Risk Fall Risk  09/24/2015 08/16/2014 12/16/2012  Falls in the past year? Yes Yes Yes  Number falls in past yr: 1 1 2  or more  Injury with Fall? No No -  Risk for fall due to : Impaired balance/gait;Impaired mobility;Impaired vision - -  Follow up Falls evaluation completed - -   Depression Screen PHQ 2/9 Scores 09/24/2015 08/16/2014 12/16/2012  PHQ - 2 Score 0 0 0    Cognitive Testing MMSE - Mini Mental State Exam 09/24/2015  Orientation to time 5  Orientation to Place 5  Registration 3  Attention/ Calculation 0  Recall 3  Language- name 2 objects 0  Language- repeat 1  Language- follow 3 step command 3  Language- read & follow direction 0  Write a sentence 0  Copy design 0  Total score 20   PLEASE NOTE: A Mini-Cog screen was completed. Maximum score is 20. A value of 0 denotes this part of [place] MMSE was not completed or the patient failed this part of the Mini-Cog screening.   Mini-Cog Screening Orientation to Time - Max 5 pts Orientation to Place - Max 5 pts Registration - Max 3 pts Recall - Max 3 pts Language Repeat - Max 1 pts Language Follow 3 Step Command - Max 3 pts   Immunization History  Administered Date(s) Administered  . Influenza Split 11/11/2010, 12/17/2011  . Influenza  Whole 12/10/2006, 11/07/2007, 11/22/2008, 11/04/2009  . Influenza,inj,Quad PF,36+ Mos 12/16/2012  . Influenza-Unspecified 12/01/2013, 12/03/2014  . Pneumococcal Conjugate-13 09/24/2015  . Pneumococcal Polysaccharide-23 04/08/2006  . Td 11/28/2001  . Tdap 11/11/2010   Screening Tests Health Maintenance  Topic Date Due  . INFLUENZA VACCINE  02/02/2016 (Originally 09/03/2015)  . COLONOSCOPY  02/06/2016  . TETANUS/TDAP  11/10/2020  . ZOSTAVAX  Addressed  . PNA vac Low Risk Adult  Completed      Plan:      I have personally reviewed and addressed the Medicare Annual Wellness questionnaire and have noted the following in the patient's  chart:  A. Medical and social history B. Use of alcohol, tobacco or illicit drugs  C. Current medications and supplements D. Functional ability and status E.  Nutritional status F.  Physical activity G. Advance directives H. List of other physicians I.  Hospitalizations, surgeries, and ER visits in previous 12 months J.  [place] to include hearing, vision, cognitive, depression L. Referrals and appointments - none  In addition, I have reviewed and discussed with patient certain preventive protocols, quality metrics, and best practice recommendations. A written personalized care plan for preventive services as well as general preventive health recommendations were provided to patient.  See attached scanned questionnaire for additional information.   Signed,   Lindell Noe, MHA, BS, LPN Health Advisor

## 2015-09-24 NOTE — Progress Notes (Signed)
Pre visit review using our clinic review tool, if applicable. No additional management support is needed unless otherwise documented below in the visit note. 

## 2015-09-24 NOTE — Patient Instructions (Addendum)
[name] , Thank you for taking time to come for your Medicare Wellness Visit. I appreciate your ongoing commitment to your health goals. Please review the following plan we discussed and let me know if I can assist you in the future.   These are the goals we discussed: Goals    . Increase physical activity          Starting 09/24/2015, I will continue to exercise at least 30 min twice weekly.        This is a list of the screening recommended for you and due dates:  Health Maintenance  Topic Date Due  . Flu Shot  02/02/2016*  . Colon Cancer Screening  02/06/2016  . Tetanus Vaccine  11/10/2020  . Shingles Vaccine  Addressed  . Pneumonia vaccines  Completed  *Topic was postponed. The date shown is not the original due date.    Preventive Care for Adults  A healthy lifestyle and preventive care can promote health and wellness. Preventive health guidelines for adults include the following key practices.  . A routine yearly physical is a good way to check with your health care provider about your health and preventive screening. It is a chance to share any concerns and updates on your health and to receive a thorough exam.  . Visit your dentist for a routine exam and preventive care every 6 months. Brush your teeth twice a day and floss once a day. Good oral hygiene prevents tooth decay and gum disease.  . The frequency of eye exams is based on your age, health, family medical history, use  of contact lenses, and other factors. Follow your health care provider's ecommendations for frequency of eye exams.  . Eat a healthy diet. Foods like vegetables, fruits, whole grains, low-fat dairy products, and lean protein foods contain the nutrients you need without too many calories. Decrease your intake of foods high in solid fats, added sugars, and salt. Eat the right amount of calories for you. Get information about a proper diet from your health care provider, if necessary.  . Regular  physical exercise is one of the most important things you can do for your health. Most adults should get at least 150 minutes of moderate-intensity exercise (any activity that increases your heart rate and causes you to sweat) each week. In addition, most adults need muscle-strengthening exercises on 2 or more days a week.  Silver Sneakers may be a benefit available to you. To determine eligibility, you may visit the website: www.silversneakers.com or contact program at 934-003-9003 Mon-Fri between 8AM-8PM.   . Maintain a healthy weight. The body mass index (BMI) is a screening tool to identify possible weight problems. It provides an estimate of body fat based on height and weight. Your health care provider can find your BMI and can help you achieve or maintain a healthy weight.   For adults 20 years and older: ? A BMI below 18.5 is considered underweight. ? A BMI of 18.5 to 24.9 is normal. ? A BMI of 25 to 29.9 is considered overweight. ? A BMI of 30 and above is considered obese.   . Maintain normal blood lipids and cholesterol levels by exercising and minimizing your intake of saturated fat. Eat a balanced diet with plenty of fruit and vegetables. Blood tests for lipids and cholesterol should begin at age 58 and be repeated every 5 years. If your lipid or cholesterol levels are high, you are over 50, or you are at high risk  for heart disease, you may need your cholesterol levels checked more frequently. Ongoing high lipid and cholesterol levels should be treated with medicines if diet and exercise are not working.  . If you smoke, find out from your health care provider how to quit. If you do not use tobacco, please do not start.  . If you choose to drink alcohol, please do not consume more than 2 drinks per day. One drink is considered to be 12 ounces (355 mL) of beer, 5 ounces (148 mL) of wine, or 1.5 ounces (44 mL) of liquor.  . If you are 80-7 years old, ask your health care provider if  you should take aspirin to prevent strokes.  . Use sunscreen. Apply sunscreen liberally and repeatedly throughout the day. You should seek shade when your shadow is shorter than you. Protect yourself by wearing long sleeves, pants, a wide-brimmed hat, and sunglasses year round, whenever you are outdoors.  . Once a month, do a whole body skin exam, using a mirror to look at the skin on your back. Tell your health care provider of new moles, moles that have irregular borders, moles that are larger than a pencil eraser, or moles that have changed in shape or color.

## 2015-10-01 ENCOUNTER — Ambulatory Visit (INDEPENDENT_AMBULATORY_CARE_PROVIDER_SITE_OTHER): Payer: Medicare Other | Admitting: Family Medicine

## 2015-10-01 ENCOUNTER — Encounter: Payer: Self-pay | Admitting: Family Medicine

## 2015-10-01 DIAGNOSIS — B029 Zoster without complications: Secondary | ICD-10-CM | POA: Diagnosis not present

## 2015-10-01 DIAGNOSIS — G6181 Chronic inflammatory demyelinating polyneuritis: Secondary | ICD-10-CM

## 2015-10-01 DIAGNOSIS — L989 Disorder of the skin and subcutaneous tissue, unspecified: Secondary | ICD-10-CM | POA: Diagnosis not present

## 2015-10-01 NOTE — Patient Instructions (Addendum)
I would get a flu shot after 10/25/15.   Update me as needed.  Take care.  Glad to see you.

## 2015-10-01 NOTE — Progress Notes (Signed)
Neuropathy. Followed at [facility]. Hyperglycemia on steroids.  Chronic steroid use.  DXA d/w pt, prev done in 2015. Labs d/w pt.  He still has some fatigue.  He has more trouble walking and less strength globally. He has f/u at [facility] pending for ~02/2016.  No new issues, focally.  He has appropriate hardware at home.  He wanted to push his DXA back for 1 year.  This is reasonable.    Shingles.  Tapering down on gabapentin, slowly.  Still with post shingles pain.  He eventually needed 2700mg  a day, now down to 1500mg  a day.  No ADE on med.  Pain is more reasonable for now.  He doesn't have the severe stabbing pain usually.  Usually ~3/10 but this past Saturday he had a flare and had to use an ice pack.  No vision changes but watering noted.  Scalp is still sensitive.  He didn't want to go back up on the gabapentin in the meantime.  D/w pt.  He didn't want to change meds or get more aggressive tx at this point.  He'll update me as needed.    Flu shot.  D/w pt about waiting about 1 month from PNA vaccine date.    PMH and SH reviewed  ROS: Per HPI unless specifically indicated in ROS section   Meds, vitals, and allergies reviewed.   GEN: nad, alert and oriented HEENT: mucous membranes moist NECK: supple w/o LA CV: rrr.  PULM: ctab, no inc wob ABD: soft, +bs EXT: 1+ BLE edema SKIN: no acute rash Walking with cane, at baseline.

## 2015-10-01 NOTE — Progress Notes (Signed)
Pre visit review using our clinic review tool, if applicable. No additional management support is needed unless otherwise documented below in the visit note. 

## 2015-10-02 DIAGNOSIS — L989 Disorder of the skin and subcutaneous tissue, unspecified: Secondary | ICD-10-CM | POA: Insufficient documentation

## 2015-10-02 NOTE — Assessment & Plan Note (Signed)
Still with pain. He is on less gabapentin and previous. He can still adjust his dose as needed, either up or down. I'm hopeful that he will have some decrease in pain gradually. He has had follow-up with the eye clinic.>25 minutes spent in face to face time with patient, >50% spent in counselling or coordination of care.  He'll update me as needed. At this point he didn't want to change his medicines more. That's reasonable.

## 2015-10-02 NOTE — Assessment & Plan Note (Signed)
He is still on steroids. He is doing the best he can to maintain as much strength as possible. He is walking with a cane. All of this is reasonable. He has follow-up [facility] pending. We talked about bone density testing. He wanted to push this back for a year. This is reasonable given the timeline and result previously. He agrees. I will await follow-up [facility].

## 2015-10-02 NOTE — Assessment & Plan Note (Signed)
Not mentioned on exam above, he has some cracking of the skin on the right heel. This is superficial and looks to be healing. It doesn't appear infected. He was having trouble giving a bandage to stick in the area. Hydrocolloid dressing applied. He can get those over-the-counter and use them as needed. Routine cautions given. He agrees.

## 2015-10-31 ENCOUNTER — Other Ambulatory Visit: Payer: Self-pay | Admitting: Family Medicine

## 2015-10-31 NOTE — Telephone Encounter (Signed)
Sent. Thanks.   

## 2015-10-31 NOTE — Telephone Encounter (Signed)
Electronic refill request. Last Filled:    270 capsule 1 06/07/2015  Please advise.

## 2015-11-15 ENCOUNTER — Encounter: Payer: Self-pay | Admitting: Family Medicine

## 2015-11-15 ENCOUNTER — Ambulatory Visit (INDEPENDENT_AMBULATORY_CARE_PROVIDER_SITE_OTHER): Payer: Medicare Other | Admitting: Family Medicine

## 2015-11-15 VITALS — BP 124/62 | HR 70 | Temp 97.7°F | Wt 229.5 lb

## 2015-11-15 DIAGNOSIS — Z23 Encounter for immunization: Secondary | ICD-10-CM

## 2015-11-15 DIAGNOSIS — D649 Anemia, unspecified: Secondary | ICD-10-CM

## 2015-11-15 LAB — CBC WITH DIFFERENTIAL/PLATELET
BASOS PCT: 0.4 % (ref 0.0–3.0)
Basophils Absolute: 0 10*3/uL (ref 0.0–0.1)
Eosinophils Absolute: 0.3 10*3/uL (ref 0.0–0.7)
Eosinophils Relative: 2.7 % (ref 0.0–5.0)
HCT: 31.5 % — ABNORMAL LOW (ref 39.0–52.0)
Hemoglobin: 10.7 g/dL — ABNORMAL LOW (ref 13.0–17.0)
Lymphocytes Relative: 73.6 % — ABNORMAL HIGH (ref 12.0–46.0)
Lymphs Abs: 7.4 10*3/uL — ABNORMAL HIGH (ref 0.7–4.0)
MCHC: 34 g/dL (ref 30.0–36.0)
MCV: 111 fl — ABNORMAL HIGH (ref 78.0–100.0)
MONO ABS: 0.7 10*3/uL (ref 0.1–1.0)
Monocytes Relative: 7.4 % (ref 3.0–12.0)
NEUTROS PCT: 15.9 % — AB (ref 43.0–77.0)
Neutro Abs: 1.6 10*3/uL (ref 1.4–7.7)
PLATELETS: 321 10*3/uL (ref 150.0–400.0)
RBC: 2.84 Mil/uL — ABNORMAL LOW (ref 4.22–5.81)
RDW: 16.7 % — ABNORMAL HIGH (ref 11.5–15.5)
WBC: 10.1 10*3/uL (ref 4.0–10.5)

## 2015-11-15 LAB — IBC PANEL
Iron: 267 ug/dL — ABNORMAL HIGH (ref 42–165)
SATURATION RATIOS: 103.6 % — AB (ref 20.0–50.0)
Transferrin: 184 mg/dL — ABNORMAL LOW (ref 212.0–360.0)

## 2015-11-15 LAB — COMPREHENSIVE METABOLIC PANEL
ALT: 57 U/L — ABNORMAL HIGH (ref 0–53)
AST: 39 U/L — ABNORMAL HIGH (ref 0–37)
Albumin: 3.9 g/dL (ref 3.5–5.2)
Alkaline Phosphatase: 74 U/L (ref 39–117)
BUN: 17 mg/dL (ref 6–23)
CO2: 29 meq/L (ref 19–32)
Calcium: 9.1 mg/dL (ref 8.4–10.5)
Chloride: 106 mEq/L (ref 96–112)
Creatinine, Ser: 1.29 mg/dL (ref 0.40–1.50)
GFR: 57.62 mL/min — ABNORMAL LOW (ref >60.00)
GLUCOSE: 105 mg/dL — AB (ref 70–99)
POTASSIUM: 4.3 meq/L (ref 3.5–5.1)
Sodium: 142 mEq/L (ref 135–145)
Total Bilirubin: 0.8 mg/dL (ref 0.2–1.2)
Total Protein: 6.3 g/dL (ref 6.0–8.3)

## 2015-11-15 LAB — VITAMIN B12: VITAMIN B 12: 782 pg/mL (ref 211–911)

## 2015-11-15 LAB — FOLATE

## 2015-11-15 LAB — TSH: TSH: 2.69 u[IU]/mL (ref 0.35–4.50)

## 2015-11-15 NOTE — Progress Notes (Signed)
1500mg  of gabapentin a day and putting up with pain.  "I'm putting up with the pain."   Abnormal CBC at [facility].   Discussed with patient about his recent labs, reviewed. He has elevated MCV with decreased hemoglobin. See plan.  PMH and SH reviewed  ROS: Per HPI unless specifically indicated in ROS section   Meds, vitals, and allergies reviewed.   No apparent distress Normocephalic/atraumatic Mucous membranes moist Neck is supple no lymphadenopathy Regular rate and rhythm Clear ask rotation bilaterally Abdomen soft nontender Extremities without edema.

## 2015-11-15 NOTE — Patient Instructions (Signed)
Go to the lab on the way out.  We'll contact you with your lab report. We'll make plans when I see your labs.  Take care.  Glad to see you.

## 2015-11-15 NOTE — Progress Notes (Signed)
Pre visit review using our clinic review tool, if applicable. No additional management support is needed unless otherwise documented below in the visit note. 

## 2015-11-17 DIAGNOSIS — D539 Nutritional anemia, unspecified: Secondary | ICD-10-CM | POA: Insufficient documentation

## 2015-11-17 NOTE — Assessment & Plan Note (Signed)
Discussed with patient about differential diagnosis for anemia, generally speaking decreased production versus inappropriate sequestration versus blood loss. At this point because of his anemia is not clear. He does not have any known black or bloody stools. Check routine labs today. See notes on labs. Send patient home with stool kit. At this point still okay for outpatient follow-up. If his labs are unrevealing or if his anemia persists, then we can set the patient with hematology. He agrees with plan. He will update me as needed.>25 minutes spent in face to face time with patient, >50% spent in counselling or coordination of care.

## 2015-11-18 LAB — LEAD, BLOOD (ADULT >= 16 YRS): Lead-Whole Blood: <1 ug/dL (ref <5)

## 2015-11-18 LAB — PATHOLOGIST SMEAR REVIEW

## 2015-11-21 ENCOUNTER — Other Ambulatory Visit: Payer: Self-pay | Admitting: Family Medicine

## 2015-11-21 DIAGNOSIS — R7989 Other specified abnormal findings of blood chemistry: Secondary | ICD-10-CM

## 2015-11-22 ENCOUNTER — Other Ambulatory Visit (INDEPENDENT_AMBULATORY_CARE_PROVIDER_SITE_OTHER): Payer: Medicare Other

## 2015-11-22 DIAGNOSIS — D649 Anemia, unspecified: Secondary | ICD-10-CM

## 2015-11-22 LAB — FECAL OCCULT BLOOD, IMMUNOCHEMICAL: FECAL OCCULT BLD: NEGATIVE

## 2015-11-27 ENCOUNTER — Inpatient Hospital Stay: Payer: Medicare Other | Attending: Hematology and Oncology | Admitting: Hematology and Oncology

## 2015-11-27 ENCOUNTER — Inpatient Hospital Stay: Payer: Medicare Other

## 2015-11-27 ENCOUNTER — Encounter: Payer: Self-pay | Admitting: Hematology and Oncology

## 2015-11-27 ENCOUNTER — Other Ambulatory Visit: Payer: Self-pay | Admitting: Hematology and Oncology

## 2015-11-27 ENCOUNTER — Other Ambulatory Visit: Payer: Self-pay

## 2015-11-27 VITALS — BP 102/67 | HR 73 | Temp 96.6°F | Resp 16 | Ht 76.97 in | Wt 230.4 lb

## 2015-11-27 DIAGNOSIS — G6181 Chronic inflammatory demyelinating polyneuritis: Secondary | ICD-10-CM

## 2015-11-27 DIAGNOSIS — R0602 Shortness of breath: Secondary | ICD-10-CM

## 2015-11-27 DIAGNOSIS — Z7982 Long term (current) use of aspirin: Secondary | ICD-10-CM | POA: Diagnosis not present

## 2015-11-27 DIAGNOSIS — T380X5A Adverse effect of glucocorticoids and synthetic analogues, initial encounter: Secondary | ICD-10-CM | POA: Diagnosis not present

## 2015-11-27 DIAGNOSIS — D539 Nutritional anemia, unspecified: Secondary | ICD-10-CM

## 2015-11-27 DIAGNOSIS — I10 Essential (primary) hypertension: Secondary | ICD-10-CM

## 2015-11-27 DIAGNOSIS — Z79899 Other long term (current) drug therapy: Secondary | ICD-10-CM | POA: Diagnosis not present

## 2015-11-27 DIAGNOSIS — G720 Drug-induced myopathy: Secondary | ICD-10-CM | POA: Insufficient documentation

## 2015-11-27 DIAGNOSIS — I951 Orthostatic hypotension: Secondary | ICD-10-CM

## 2015-11-27 DIAGNOSIS — R0600 Dyspnea, unspecified: Secondary | ICD-10-CM | POA: Insufficient documentation

## 2015-11-27 DIAGNOSIS — R296 Repeated falls: Secondary | ICD-10-CM

## 2015-11-27 DIAGNOSIS — D479 Neoplasm of uncertain behavior of lymphoid, hematopoietic and related tissue, unspecified: Secondary | ICD-10-CM

## 2015-11-27 DIAGNOSIS — R600 Localized edema: Secondary | ICD-10-CM | POA: Insufficient documentation

## 2015-11-27 LAB — CBC WITH DIFFERENTIAL/PLATELET
BASOS PCT: 1 %
Basophils Absolute: 0 10*3/uL (ref 0–0.1)
EOS ABS: 0.1 10*3/uL (ref 0–0.7)
Eosinophils Relative: 2 %
HCT: 32.4 % — ABNORMAL LOW (ref 40.0–52.0)
HEMOGLOBIN: 11 g/dL — AB (ref 13.0–18.0)
Lymphocytes Relative: 46 %
Lymphs Abs: 3.3 10*3/uL (ref 1.0–3.6)
MCH: 38.3 pg — ABNORMAL HIGH (ref 26.0–34.0)
MCHC: 34 g/dL (ref 32.0–36.0)
MCV: 112.7 fL — ABNORMAL HIGH (ref 80.0–100.0)
MONOS PCT: 5 %
Monocytes Absolute: 0.3 10*3/uL (ref 0.2–1.0)
NEUTROS PCT: 46 %
Neutro Abs: 3.3 10*3/uL (ref 1.4–6.5)
Platelets: 307 10*3/uL (ref 150–440)
RBC: 2.88 MIL/uL — AB (ref 4.40–5.90)
RDW: 16.5 % — ABNORMAL HIGH (ref 11.5–14.5)
WBC: 7.1 10*3/uL (ref 3.8–10.6)

## 2015-11-27 LAB — RETICULOCYTES
RBC.: 2.87 MIL/uL — AB (ref 4.40–5.90)
RETIC COUNT ABSOLUTE: 57.4 10*3/uL (ref 19.0–183.0)
RETIC CT PCT: 2 % (ref 0.4–3.1)

## 2015-11-27 LAB — URIC ACID: URIC ACID, SERUM: 7.6 mg/dL (ref 4.4–7.6)

## 2015-11-27 LAB — DAT, POLYSPECIFIC AHG (ARMC ONLY): POLYSPECIFIC AHG TEST: NEGATIVE

## 2015-11-27 LAB — BRAIN NATRIURETIC PEPTIDE: B Natriuretic Peptide: 185 pg/mL — ABNORMAL HIGH (ref 0.0–100.0)

## 2015-11-27 LAB — LACTATE DEHYDROGENASE: LDH: 199 U/L — AB (ref 98–192)

## 2015-11-27 NOTE — Assessment & Plan Note (Signed)
He has frequent falls. I'm concerned about his bleeding risk. I recommend reducing aspirin therapy to 182 mg daily

## 2015-11-27 NOTE — Assessment & Plan Note (Signed)
The patient has shortness of breath on minimal exertion with bilateral lower extremity edema. He had orthostatic hypotension and the frequent symptoms of dizziness/falls I will order cardiac markers to exclude possible diagnosis of congestive heart failure

## 2015-11-27 NOTE — Assessment & Plan Note (Addendum)
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 He is quite debilitated. He might benefit from rehabilitation for deconditioning in the future after further workup as above.

## 2015-11-27 NOTE — Assessment & Plan Note (Signed)
Recent CBC show lymphocytosis and along with the macrocytic anemia, is worrisome for lymphoproliferative disorder. I will order additional workup including flow cytometry, FISH panel and CT scan of the chest, abdomen and pelvis to exclude lymphoma

## 2015-11-27 NOTE — Progress Notes (Signed)
[place] CONSULT NOTE  Patient Care Team: Tonia Ghent, MD as PCP - General (Family Medicine)  CHIEF COMPLAINTS/PURPOSE OF CONSULTATION:  Progressive macrocytic anemia with lymphocytosis, concern for possible bone marrow disorder, leukemia/lymphoma  HISTORY OF PRESENTING ILLNESS:  [name] 75 y.o. male is here today with his wife. 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 Over the past year, the patient has progressive decline in performance status. He estimated 14-16 hours while in bed resting due to profound fatigue. He has reported some weakness and dizziness. The patient have chronic bilateral shoulder pain and chronic back pain. With his weakness, he had frequent falls but denies major injuries. He has significant debilitating postherpetic neuralgia as well as significant neuropathy from CIDP. In June 2016, he suffered from shingles infection causing significant postherpetic pain on his right face/forehead region He was on high-dose gabapentin, at one point, taking 2700 mg daily but recently was able to taper down to 1500 mg daily. He rated his pain currently at 5 out of 10. From the neuropathy standpoint, he has no sensation from knees down. He also have tingling sensation on his hands. He has chronic bilateral lower extremity edema with chronic recurrent cellulitis. He denies anorexia or change in bowel habits. He has shortness of breath on minimal exertion. He bleeds easily and have easy bruising  He has poor vision due to early cataracts His skin rashes do not bother him.  MEDICAL HISTORY:  Past Medical History:  Diagnosis Date  . Cancer ([place])    skin cancer, resected by derm (Dr. Evorn Gong)  . Cholelithiasis 01/95   via abdominal ultrasound  . 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 Diverticulosis of colon 01/95  . Hyperglycemia    Related to chronic steroid use  . Hypertension 08/07  . Nephrolithiasis 1993   X 2  . Polyneuropathy ([facility])     Chronic inflammatory demylenating - Prednisone per [id number]  . Syncope 08/05/07   Presumed hypotension    SURGICAL HISTORY: Past Surgical History:  Procedure Laterality Date  . CHOLECYSTECTOMY  10/27/99   [facility] Done)  . DOPPLER ECHOCARDIOGRAPHY  08/01   T.R.; T.R.; P/R. borderline LVH  . ESOPHAGOGASTRODUODENOSCOPY  12/19/05   gastritis; [facility]  . HERNIA REPAIR  06/17/04   Laparoscopic, bilateral  . [place]  . LUMBAR DISC SURGERY  2000   L3/4 rupture  . nephrolithiasis      SOCIAL HISTORY: Social History   Social History  . Marital status: Married    Spouse name: N/A  . Number of children: 2  . Years of education: N/A   Occupational History  . Retired from [employer] ([employer]) in 1998   . 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 Coached    Social History Main Topics  . Smoking status: Never Smoker  . Smokeless tobacco: Never Used  . Alcohol use No  . Drug use: No  . Sexual activity: No   Other Topics Concern  . Not on file   Social History Narrative   Former Pharmacist, hospital, coach   Married 1963   2 kids    FAMILY HISTORY: Family History  Problem Relation Age of Onset  . Hypertension Mother   . Heart disease Mother     MI  . Kidney disease Father     Kidney failure  .  Parkinsonism Father   . Cancer Sister     Acute leukemia  . Arthritis Sister   . Cancer Brother     liver cancer  . Diabetes Neg Hx   . Stroke Neg Hx   . Prostate cancer Neg Hx   . Colon cancer Neg Hx     ALLERGIES:  is allergic to sulfa antibiotics and sulfonamide derivatives.  MEDICATIONS:  Current Outpatient Prescriptions  Medication Sig Dispense Refill  . aspirin 325 MG tablet Take 325 mg by mouth daily.      Marland Kitchen CALCIUM CARB-CHOLECALCIFEROL PO Take 1,250 mg by mouth daily.    .  Cyanocobalamin (VITAMIN B-12) 500 MCG SUBL Place 1 tablet under the tongue daily.     Marland Kitchen econazole nitrate 1 % cream Apply 1 application topically daily as needed.    . gabapentin (NEURONTIN) 300 MG capsule TAKE 2-3 CAPSULES (600-900 MG TOTAL) BY MOUTH 3 (THREE) TIMES DAILY AS NEEDED. 270 capsule 1  . ketoconazole (NIZORAL) 2 % shampoo Apply 1 application topically as needed.     Marland Kitchen lisinopril (PRINIVIL,ZESTRIL) 5 MG tablet TAKE 1 TABLET BY MOUTH ONCE A DAY AT NIGHT 90 tablet 3  . Multiple Vitamin (MULTIVITAMIN) tablet Take 1 tablet by mouth daily.      . predniSONE (DELTASONE) 10 MG tablet Take 10 mg by mouth daily with breakfast.    . timolol (TIMOPTIC) 0.5 % ophthalmic solution Place 1 drop into both eyes at bedtime. One drop in each eye at bedtime.    . triamcinolone (KENALOG) 0.025 % cream Apply 1 application topically as needed.     No current facility-administered medications for this visit.     REVIEW OF SYSTEMS:   Constitutional: Denies fevers, chills or abnormal night sweats Eyes: Denies double vision or watery eyes Ears, nose, mouth, throat, and face: Denies mucositis or sore throat Respiratory: Denies cough or wheezes Gastrointestinal:  Denies nausea, heartburn or change in bowel habits Lymphatics: Denies new lymphadenopathy  Behavioral/Psych: Mood is stable, no new changes  All other systems were reviewed with the patient and are negative.  PHYSICAL EXAMINATION: ECOG PERFORMANCE STATUS: 3 - Symptomatic, >50% confined to bed  Vitals:   11/27/15 1053  BP: 102/67  Pulse: 73  Resp: 16  Temp: (!) 96.6 F (35.9 C)   Filed Weights   11/27/15 1053  Weight: 230 lb 6.1 oz (104.5 kg)    GENERAL:alert, no distress and comfortable. He appears mildly cushingoid SKIN: He has fine macular rash on his torso. Thin skin and old bruises are noted. Noted significant bilateral anterior shin discoloration compatible with chronic stasis dermatitis EYES: normal, conjunctiva are pink and  non-injected, sclera clear OROPHARYNX:no exudate, no erythema and lips, buccal mucosa, and tongue normal  NECK: supple, thyroid normal size, non-tender, without nodularity LYMPH:  no palpable lymphadenopathy in the cervical, axillary or inguinal LUNGS: clear to auscultation and percussion with normal breathing effort HEART: regular rate & rhythm and no murmurs with moderate lower extremity edema ABDOMEN:abdomen soft, non-tender and normal bowel sounds Musculoskeletal:no cyanosis of digits and no clubbing. Proximal muscle wasting is noted PSYCH: alert & oriented x 3 with fluent speech NEURO: no focal motor deficits  LABORATORY DATA:  I have reviewed the data as listed Lab Results  Component Value Date   WBC 7.1 11/27/2015   HGB 11.0 (L) 11/27/2015   HCT 32.4 (L) 11/27/2015   MCV 112.7 (H) 11/27/2015   PLT 307 11/27/2015    Recent Labs  09/24/15 0918 11/15/15 [id number]  NA 142 142  K 3.9 4.3  CL 107 106  CO2 28 29  GLUCOSE 100* 105*  BUN 18 17  CREATININE 1.25 1.29  CALCIUM 8.4 9.1  PROT 6.0 6.3  ALBUMIN 3.6 3.9  AST 37 39*  ALT 47 57*  ALKPHOS 65 74  BILITOT 0.7 0.8    ASSESSMENT & PLAN:  Lymphoproliferative disorder (HCC) Recent CBC show lymphocytosis and along with the macrocytic anemia, is worrisome for lymphoproliferative disorder. I will order additional workup including flow cytometry, FISH panel and CT scan of the chest, abdomen and pelvis to exclude lymphoma  Macrocytic anemia His primary doctor had ordered extensive workup. I am concerned about possible undiagnosed bone marrow disease. I will order blood work, CT imaging and further workup to exclude lymphoma/myeloma  CIDP (chronic inflammatory demyelinating polyneuropathy) ([place]) The patient was diagnosed with CIDP and has been steroid dependent. He has a constellation of rare symptoms including skin rashes, significant worsening peripheral neuropathy despite being on corticosteroid therapy, orthostatic  hypotension and others, raising the possibility of another possible infiltrative disorder such as amyloidosis. I will order serum protein electrophoresis and free light chain for further assessment and he agreed to proceed  Dyspnea The patient has shortness of breath on minimal exertion with bilateral lower extremity edema. 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 He is quite debilitated. He might benefit from rehabilitation for deconditioning in the future after further workup as above.   Frequent falls He has frequent falls. I'm concerned about his bleeding risk. I recommend reducing aspirin therapy to 182 mg daily  Orders Placed This Encounter  Procedures  . CT CHEST W CONTRAST    Standing Status:   Future    Standing Expiration Date:   12/31/2016    Order Specific Question:   Reason for exam:    Answer:   suspect lymphoma    Order Specific Question:   Preferred imaging location?    Answer:   [facility]  . CT ABDOMEN PELVIS W CONTRAST    Standing Status:   Future    Standing Expiration Date:   12/31/2016    Order Specific Question:   Reason for exam:    Answer:   suspect lymphoma    Order Specific Question:   Preferred imaging location?    Answer:   [facility]  . Brain natriuretic peptide  . Kappa/lambda light chains    Standing Status:   Future    Number of Occurrences:   1    Standing Expiration Date:   12/31/2016  . Flow cytometry panel-leukemia/lymphoma work-up    Standing Status:   Future    Number of  Occurrences:   1    Standing Expiration Date:   11/26/2016  . Reticulocytes    Standing Status:   Future    Number of Occurrences:   1    Standing Expiration Date:   11/26/2016     All questions were answered. The patient knows to call the clinic with any problems, questions or concerns. I spent 55 minutes counseling the patient face to face. The total time spent in the appointment was  60 minutes and more than 50% was on counseling.     Heath Lark, MD 11/27/2015 2:27 PM

## 2015-11-27 NOTE — Assessment & Plan Note (Signed)
His primary doctor had ordered extensive workup. I am concerned about possible undiagnosed bone marrow disease. I will order blood work, CT imaging and further workup to exclude lymphoma/myeloma

## 2015-11-27 NOTE — Assessment & Plan Note (Signed)
The patient takes lisinopril for hypertension but he is currently hypotensive and appears symptomatic I recommend holding off lisinopril for now and I will reassess his blood pressure next week

## 2015-11-27 NOTE — Assessment & Plan Note (Signed)
The patient was diagnosed with CIDP and has been steroid dependent. He has a constellation of rare symptoms including skin rashes, significant worsening peripheral neuropathy despite being on corticosteroid therapy, orthostatic hypotension and others, raising the possibility of another possible infiltrative disorder such as amyloidosis. I will order serum protein electrophoresis and free light chain for further assessment and he agreed to proceed

## 2015-11-28 LAB — KAPPA/LAMBDA LIGHT CHAINS
KAPPA FREE LGHT CHN: 26.6 mg/L — AB (ref 3.3–19.4)
Kappa, lambda light chain ratio: 2.71 — ABNORMAL HIGH (ref 0.26–1.65)
Lambda free light chains: 9.8 mg/L (ref 5.7–26.3)

## 2015-11-29 LAB — MULTIPLE MYELOMA PANEL, SERUM
ALBUMIN SERPL ELPH-MCNC: 3.7 g/dL (ref 2.9–4.4)
ALPHA2 GLOB SERPL ELPH-MCNC: 0.6 g/dL (ref 0.4–1.0)
Albumin/Glob SerPl: 1.5 (ref 0.7–1.7)
Alpha 1: 0.2 g/dL (ref 0.0–0.4)
B-GLOBULIN SERPL ELPH-MCNC: 0.7 g/dL (ref 0.7–1.3)
Gamma Glob SerPl Elph-Mcnc: 1.1 g/dL (ref 0.4–1.8)
Globulin, Total: 2.6 g/dL (ref 2.2–3.9)
IGG (IMMUNOGLOBIN G), SERUM: 1174 mg/dL (ref 700–1600)
IGM, SERUM: 13 mg/dL — AB (ref 15–143)
IgA: 51 mg/dL — ABNORMAL LOW (ref 61–437)
M PROTEIN SERPL ELPH-MCNC: 0.7 g/dL — AB
TOTAL PROTEIN ELP: 6.3 g/dL (ref 6.0–8.5)

## 2015-12-02 LAB — COMP PANEL: LEUKEMIA/LYMPHOMA

## 2015-12-03 ENCOUNTER — Ambulatory Visit
Admission: RE | Admit: 2015-12-03 | Discharge: 2015-12-03 | Disposition: A | Discharge status: Home / Self Care | Payer: Medicare Other | Source: Ambulatory Visit | Attending: Hematology and Oncology | Admitting: Hematology and Oncology

## 2015-12-03 DIAGNOSIS — K760 Fatty (change of) liver, not elsewhere classified: Secondary | ICD-10-CM | POA: Insufficient documentation

## 2015-12-03 DIAGNOSIS — I700 Atherosclerosis of aorta: Secondary | ICD-10-CM | POA: Diagnosis not present

## 2015-12-03 DIAGNOSIS — N200 Calculus of kidney: Secondary | ICD-10-CM | POA: Insufficient documentation

## 2015-12-03 DIAGNOSIS — G6181 Chronic inflammatory demyelinating polyneuritis: Secondary | ICD-10-CM

## 2015-12-03 DIAGNOSIS — D479 Neoplasm of uncertain behavior of lymphoid, hematopoietic and related tissue, unspecified: Secondary | ICD-10-CM | POA: Diagnosis not present

## 2015-12-03 DIAGNOSIS — K5730 Diverticulosis of large intestine without perforation or abscess without bleeding: Secondary | ICD-10-CM | POA: Insufficient documentation

## 2015-12-03 HISTORY — DX: Basal cell carcinoma of skin, unspecified: C44.91

## 2015-12-03 MED ORDER — IOPAMIDOL (ISOVUE-300) INJECTION 61%
100.0000 mL | Freq: Once | INTRAVENOUS | Status: AC | PRN
  Administered 2015-12-03: 100 mL via INTRAVENOUS

## 2015-12-06 ENCOUNTER — Encounter: Payer: Self-pay | Admitting: Hematology and Oncology

## 2015-12-06 ENCOUNTER — Inpatient Hospital Stay: Payer: Medicare Other | Attending: Hematology and Oncology | Admitting: Hematology and Oncology

## 2015-12-06 VITALS — BP 135/79 | HR 85 | Temp 95.7°F | Resp 18 | Wt 227.8 lb

## 2015-12-06 DIAGNOSIS — Z79899 Other long term (current) drug therapy: Secondary | ICD-10-CM | POA: Insufficient documentation

## 2015-12-06 DIAGNOSIS — G6181 Chronic inflammatory demyelinating polyneuritis: Secondary | ICD-10-CM | POA: Diagnosis not present

## 2015-12-06 DIAGNOSIS — T380X5A Adverse effect of glucocorticoids and synthetic analogues, initial encounter: Secondary | ICD-10-CM | POA: Diagnosis not present

## 2015-12-06 DIAGNOSIS — D539 Nutritional anemia, unspecified: Secondary | ICD-10-CM | POA: Insufficient documentation

## 2015-12-06 DIAGNOSIS — G720 Drug-induced myopathy: Secondary | ICD-10-CM | POA: Diagnosis not present

## 2015-12-06 DIAGNOSIS — N200 Calculus of kidney: Secondary | ICD-10-CM | POA: Diagnosis not present

## 2015-12-06 DIAGNOSIS — D479 Neoplasm of uncertain behavior of lymphoid, hematopoietic and related tissue, unspecified: Secondary | ICD-10-CM | POA: Diagnosis not present

## 2015-12-06 DIAGNOSIS — C91Z0 Other lymphoid leukemia not having achieved remission: Secondary | ICD-10-CM | POA: Diagnosis present

## 2015-12-06 DIAGNOSIS — D472 Monoclonal gammopathy: Secondary | ICD-10-CM | POA: Diagnosis not present

## 2015-12-06 NOTE — Assessment & Plan Note (Signed)
He has chronic flank pain and CT scan revealed multiple bilateral kidney stones I recommend urology consult

## 2015-12-06 NOTE — Assessment & Plan Note (Signed)
The patient was diagnosed with CIDP and has been steroid dependent. 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

## 2015-12-06 NOTE — Assessment & Plan Note (Signed)
Peripheral flow cytometry detected abnormal T-cell population, significance unknown He has very high MCV with macrocytic anemia without any other possible explanation Serum vitamin B-12 and folate were within normal limits I recommend bone marrow aspirate and biopsy to exclude T-cell related lymphoproliferative disorder and he agreed to proceed

## 2015-12-06 NOTE — Assessment & Plan Note (Signed)
We discussed the natural history of MGUS He will need yearly follow-up

## 2015-12-06 NOTE — Progress Notes (Signed)
[facility] OFFICE PROGRESS NOTE  Patient Care Team: Tonia Ghent, MD as PCP - General (Family Medicine)  SUMMARY OF ONCOLOGIC HISTORY:  [name] 75 y.o. 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 Over the past year, the patient has progressive decline in performance status. He estimated 14-16 hours while in bed resting due to profound fatigue. He has reported some weakness and dizziness. The patient have chronic bilateral shoulder pain and chronic back pain. With his weakness, he had frequent falls but denies major injuries. He has significant debilitating postherpetic neuralgia as well as significant neuropathy from CIDP. In June 2016, he suffered from shingles infection causing significant postherpetic pain on his right face/forehead region He was on high-dose gabapentin, at one point, taking 2700 mg daily but recently was able to taper down to 1500 mg daily. He rated his pain currently at 5 out of 10. From the neuropathy standpoint, he has no sensation from knees down. He also have tingling sensation on his hands. He has chronic bilateral lower extremity edema with chronic recurrent cellulitis. He denies anorexia or change in bowel habits. He has shortness of breath on minimal exertion. He bleeds easily and have easy bruising  He has poor vision due to early cataracts His skin rashes do not bother him.  INTERVAL HISTORY: Please see below for problem oriented charting. He is here today with his wife and children His symptoms are about the same He denies recent falls  REVIEW OF SYSTEMS:  All other systems were reviewed with the patient and are negative.  I have reviewed the past medical history, past surgical history, social history and family history with the patient and  they are unchanged from previous note.  ALLERGIES:  is allergic to sulfa antibiotics and sulfonamide derivatives.  MEDICATIONS:  Current Outpatient Prescriptions  Medication Sig Dispense Refill  . aspirin EC 81 MG tablet Take 162 mg by mouth daily.    Marland Kitchen CALCIUM CARB-CHOLECALCIFEROL PO Take 1,250 mg by mouth daily.    . Cyanocobalamin (VITAMIN B-12) 500 MCG SUBL Place 1 tablet under the tongue daily.     Marland Kitchen econazole nitrate 1 % cream Apply 1 application topically daily as needed.    . gabapentin (NEURONTIN) 300 MG capsule TAKE 2-3 CAPSULES (600-900 MG TOTAL) BY MOUTH 3 (THREE) TIMES DAILY AS NEEDED. 270 capsule 1  . ketoconazole (NIZORAL) 2 % shampoo Apply 1 application topically as needed.     . Multiple Vitamin (MULTIVITAMIN) tablet Take 1 tablet by mouth daily.      . predniSONE (DELTASONE) 10 MG tablet Take 10 mg by mouth daily with breakfast.    . timolol (TIMOPTIC) 0.5 % ophthalmic solution Place 1 drop into both eyes at bedtime. One drop in each eye at bedtime.    . triamcinolone (KENALOG) 0.025 % cream Apply 1 application topically as needed.     No current facility-administered medications for this visit.     PHYSICAL EXAMINATION: ECOG PERFORMANCE STATUS: 2 - Symptomatic, <50% confined to bed  Vitals:   12/06/15 1053  BP: 135/79  Pulse: 85  Resp: 18  Temp: (!) 95.7 F (35.4 C)   Filed Weights   12/06/15 1053  Weight: 227 lb 13.5 oz (103.4 kg)    GENERAL:alert, no distress and comfortable SKIN: skin color, texture, turgor are normal, no rashes  or significant lesions EYES: normal, Conjunctiva are pink and non-injected, sclera clear Musculoskeletal:no cyanosis of digits and no clubbing  NEURO: alert & oriented x 3 with fluent speech, no focal motor/sensory deficits  LABORATORY DATA:  I have reviewed the data as listed    Component Value Date/Time   NA 142 11/15/2015 0946   K 4.3 11/15/2015 0946   CL 106 11/15/2015 0946   CO2 29 11/15/2015 0946   GLUCOSE 105 (H)  11/15/2015 0946   BUN 17 11/15/2015 0946   CREATININE 1.29 11/15/2015 0946   CALCIUM 9.1 11/15/2015 0946   PROT 6.3 11/15/2015 0946   ALBUMIN 3.9 11/15/2015 0946   AST 39 (H) 11/15/2015 0946   ALT 57 (H) 11/15/2015 0946   ALKPHOS 74 11/15/2015 0946   BILITOT 0.8 11/15/2015 0946   GFRNONAA 65 (L) 05/20/2011 0645   GFRAA 75 (L) 05/20/2011 0645    No results found for: SPEP, UPEP  Lab Results  Component Value Date   WBC 7.1 11/27/2015   NEUTROABS 3.3 11/27/2015   HGB 11.0 (L) 11/27/2015   HCT 32.4 (L) 11/27/2015   MCV 112.7 (H) 11/27/2015   PLT 307 11/27/2015      Chemistry      Component Value Date/Time   NA 142 11/15/2015 0946   K 4.3 11/15/2015 0946   CL 106 11/15/2015 0946   CO2 29 11/15/2015 0946   BUN 17 11/15/2015 0946   CREATININE 1.29 11/15/2015 0946      Component Value Date/Time   CALCIUM 9.1 11/15/2015 0946   ALKPHOS 74 11/15/2015 0946   AST 39 (H) 11/15/2015 0946   ALT 57 (H) 11/15/2015 0946   BILITOT 0.8 11/15/2015 0946       RADIOGRAPHIC STUDIES:I reviewed the CT imaging and results I have personally reviewed the radiological images as listed and agreed with the findings in the report.    ASSESSMENT & PLAN:  Lymphoproliferative disorder ([place]) Peripheral flow cytometry detected abnormal T-cell population, significance unknown He has very high MCV with macrocytic anemia without any other possible explanation Serum vitamin B-12 and folate were within normal limits I recommend bone marrow aspirate and biopsy to exclude T-cell related lymphoproliferative disorder and he agreed to proceed  CIDP (chronic inflammatory demyelinating polyneuropathy) ([place]) The patient was diagnosed with CIDP and has been steroid dependent. 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 CT Biopsy    Standing Status:   Future    Standing Expiration Date:   12/05/2016    Order Specific Question:   Lab orders requested (DO NOT place separate lab orders, these will be automatically ordered during procedure specimen collection):    Answer:   Surgical Pathology    Order Specific Question:   Reason for Exam (SYMPTOM  OR DIAGNOSIS REQUIRED)    Answer:   Suspect leukemia    Order Specific Question:   Preferred imaging location?    Answer:   [facility]  . CBC with Differential/Platelet    Standing Status:   Future    Standing Expiration Date:   01/09/2017  . Ferritin  Standing Status:   Future    Standing Expiration Date:   12/05/2016  . Ambulatory referral to Urology    Referral Priority:   Routine    Referral Type:   Consultation    Referral Reason:   Specialty Services Required    Requested Specialty:   Urology    Number of Visits Requested:   1   All questions were answered. The patient knows to call the clinic with any problems, questions or concerns. No barriers to learning was detected. I spent 25 minutes counseling the patient face to face. The total time spent in the appointment was 40 minutes and more than 50% was on counseling and review of test results     Heath Lark, MD 12/06/2015 1:56 PM

## 2015-12-10 ENCOUNTER — Other Ambulatory Visit: Payer: Self-pay | Admitting: Radiology

## 2015-12-11 ENCOUNTER — Ambulatory Visit
Admission: RE | Admit: 2015-12-11 | Discharge: 2015-12-11 | Disposition: A | Discharge status: Home / Self Care | Payer: Medicare Other | Source: Ambulatory Visit | Attending: Hematology and Oncology | Admitting: Hematology and Oncology

## 2015-12-11 ENCOUNTER — Encounter: Payer: Self-pay | Admitting: Hematology and Oncology

## 2015-12-11 DIAGNOSIS — Z85828 Personal history of other malignant neoplasm of skin: Secondary | ICD-10-CM | POA: Insufficient documentation

## 2015-12-11 DIAGNOSIS — M549 Dorsalgia, unspecified: Secondary | ICD-10-CM | POA: Insufficient documentation

## 2015-12-11 DIAGNOSIS — M25512 Pain in left shoulder: Secondary | ICD-10-CM | POA: Diagnosis not present

## 2015-12-11 DIAGNOSIS — G8929 Other chronic pain: Secondary | ICD-10-CM | POA: Diagnosis not present

## 2015-12-11 DIAGNOSIS — G6181 Chronic inflammatory demyelinating polyneuritis: Secondary | ICD-10-CM | POA: Insufficient documentation

## 2015-12-11 DIAGNOSIS — Z7982 Long term (current) use of aspirin: Secondary | ICD-10-CM | POA: Diagnosis not present

## 2015-12-11 DIAGNOSIS — Z79899 Other long term (current) drug therapy: Secondary | ICD-10-CM | POA: Insufficient documentation

## 2015-12-11 DIAGNOSIS — L111 Transient acantholytic dermatosis [Grover]: Secondary | ICD-10-CM | POA: Insufficient documentation

## 2015-12-11 DIAGNOSIS — R531 Weakness: Secondary | ICD-10-CM | POA: Insufficient documentation

## 2015-12-11 DIAGNOSIS — D479 Neoplasm of uncertain behavior of lymphoid, hematopoietic and related tissue, unspecified: Secondary | ICD-10-CM | POA: Diagnosis present

## 2015-12-11 DIAGNOSIS — M25511 Pain in right shoulder: Secondary | ICD-10-CM | POA: Diagnosis not present

## 2015-12-11 DIAGNOSIS — Z9181 History of falling: Secondary | ICD-10-CM | POA: Diagnosis not present

## 2015-12-11 HISTORY — DX: Personal history of urinary calculi: Z87.442

## 2015-12-11 LAB — DIFFERENTIAL
BASOS ABS: 0.1 10*3/uL (ref 0–0.1)
Basophils Relative: 1 %
EOS PCT: 3 %
Eosinophils Absolute: 0.2 10*3/uL (ref 0–0.7)
LYMPHS ABS: 4.7 10*3/uL — AB (ref 1.0–3.6)
Lymphocytes Relative: 69 %
MONOS PCT: 8 %
Monocytes Absolute: 0.5 10*3/uL (ref 0.2–1.0)
NEUTROS ABS: 1.3 10*3/uL — AB (ref 1.4–6.5)
Neutrophils Relative %: 19 %

## 2015-12-11 LAB — CBC
HEMATOCRIT: 33 % — AB (ref 40.0–52.0)
Hemoglobin: 11.1 g/dL — ABNORMAL LOW (ref 13.0–18.0)
MCH: 37.9 pg — ABNORMAL HIGH (ref 26.0–34.0)
MCHC: 33.8 g/dL (ref 32.0–36.0)
MCV: 112.2 fL — ABNORMAL HIGH (ref 80.0–100.0)
PLATELETS: 292 10*3/uL (ref 150–440)
RBC: 2.94 MIL/uL — ABNORMAL LOW (ref 4.40–5.90)
RDW: 16.2 % — AB (ref 11.5–14.5)
WBC: 6.8 10*3/uL (ref 3.8–10.6)

## 2015-12-11 MED ORDER — BUPIVACAINE HCL (PF) 0.25 % IJ SOLN
INTRAMUSCULAR | Status: AC | PRN
  Administered 2015-12-11: 10 mL

## 2015-12-11 MED ORDER — HEPARIN SOD (PORK) LOCK FLUSH 100 UNIT/ML IV SOLN
INTRAVENOUS | Status: AC
  Filled 2015-12-11: qty 5

## 2015-12-11 MED ORDER — MIDAZOLAM HCL 5 MG/5ML IJ SOLN
INTRAMUSCULAR | Status: AC
  Filled 2015-12-11: qty 5

## 2015-12-11 MED ORDER — BUPIVACAINE HCL (PF) 0.25 % IJ SOLN
INTRAMUSCULAR | Status: AC
Start: 2015-12-11 — End: 2015-12-11
  Filled 2015-12-11: qty 30

## 2015-12-11 MED ORDER — SODIUM CHLORIDE 0.9 % IV SOLN
INTRAVENOUS | Status: DC
  Administered 2015-12-11: 08:00:00 via INTRAVENOUS

## 2015-12-11 MED ORDER — FENTANYL CITRATE (PF) 100 MCG/2ML IJ SOLN
INTRAMUSCULAR | Status: AC
  Filled 2015-12-11: qty 2

## 2015-12-11 MED ORDER — HYDROCODONE-ACETAMINOPHEN 5-325 MG PO TABS
1.0000 | ORAL_TABLET | ORAL | Status: DC | PRN
  Filled 2015-12-11: qty 2

## 2015-12-11 NOTE — Procedures (Signed)
CT bone marrow biopsy  Complications:  None  Blood Loss: none  See dictation in canopy pacs  

## 2015-12-18 ENCOUNTER — Other Ambulatory Visit: Payer: Self-pay

## 2015-12-18 ENCOUNTER — Encounter: Payer: Self-pay | Admitting: Hematology and Oncology

## 2015-12-18 ENCOUNTER — Inpatient Hospital Stay (HOSPITAL_BASED_OUTPATIENT_CLINIC_OR_DEPARTMENT_OTHER): Payer: Medicare Other | Admitting: Hematology and Oncology

## 2015-12-18 DIAGNOSIS — D539 Nutritional anemia, unspecified: Secondary | ICD-10-CM | POA: Diagnosis not present

## 2015-12-18 DIAGNOSIS — G720 Drug-induced myopathy: Secondary | ICD-10-CM

## 2015-12-18 DIAGNOSIS — D472 Monoclonal gammopathy: Secondary | ICD-10-CM | POA: Diagnosis not present

## 2015-12-18 DIAGNOSIS — Z79899 Other long term (current) drug therapy: Secondary | ICD-10-CM

## 2015-12-18 DIAGNOSIS — G6181 Chronic inflammatory demyelinating polyneuritis: Secondary | ICD-10-CM

## 2015-12-18 DIAGNOSIS — T380X5A Adverse effect of glucocorticoids and synthetic analogues, initial encounter: Secondary | ICD-10-CM

## 2015-12-18 DIAGNOSIS — C91Z0 Other lymphoid leukemia not having achieved remission: Secondary | ICD-10-CM | POA: Insufficient documentation

## 2015-12-18 DIAGNOSIS — R5381 Other malaise: Secondary | ICD-10-CM

## 2015-12-18 MED ORDER — ONDANSETRON HCL 8 MG PO TABS: 8.0000 mg | ORAL_TABLET | Freq: Three times a day (TID) | ORAL | 3 refills | Status: DC | PRN

## 2015-12-18 MED ORDER — METHOTREXATE SODIUM 10 MG PO TABS: 10.0000 mg | ORAL_TABLET | ORAL | 12 refills | Status: DC

## 2015-12-18 NOTE — Progress Notes (Signed)
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 T-cell receptor rearrangement study is still pending      [name] 75 y.o. 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 Over the past year, the patient has progressive decline in performance status. He estimated 14-16 hours while in bed resting due to profound fatigue. He has reported some weakness and dizziness. The patient have chronic bilateral shoulder pain and chronic back pain. With his weakness, he had frequent falls but denies major injuries. He has significant debilitating postherpetic neuralgia as well as significant neuropathy from CIDP. In June 2016, he suffered from shingles infection causing significant postherpetic pain on his right face/forehead region He was on high-dose gabapentin, at one point, taking 2700 mg daily but recently was able to taper down to 1500 mg daily. He rated his pain currently at 5 out of 10. From the neuropathy standpoint, he has no sensation from knees down. He also have tingling sensation on his hands. He has chronic bilateral lower extremity edema with chronic recurrent cellulitis. He denies anorexia or change in bowel habits. He has shortness of breath on minimal exertion. He bleeds easily and have easy bruising  He has poor vision due to early cataracts His skin rashes do not bother him. In October 2017, I order extensive workup which show he had MGUS and possibly T-cell lymphoproliferative disorder. CT scan of the chest, abdomen and pelvis showed no evidence of lymphoma. On  12/11/2015, he proceed with bone marrow aspirate and biopsy which showed T-cell LGL along with 5-10% plasma cell involvement INTERVAL HISTORY: Please see below for problem oriented charting. He returns today with multiple family members including his wife, daughter and son-in-law His symptoms are about the same. He complained of chronic fatigue and persistent severe peripheral neuropathy  REVIEW OF SYSTEMS:   Constitutional: Denies fevers, chills or abnormal weight loss Eyes: Denies blurriness of vision Ears, nose, mouth, throat, and face: Denies mucositis or sore throat Respiratory: Denies cough, dyspnea or wheezes Cardiovascular: Denies palpitation, chest discomfort or lower extremity swelling Gastrointestinal:  Denies nausea, heartburn or change in bowel habits Skin: Denies abnormal skin rashes Lymphatics: Denies new lymphadenopathy or easy bruising Behavioral/Psych: Mood is stable, no new changes  All other systems were reviewed with the patient and are negative.  I have reviewed the past medical history, past surgical history, social history and family history with the patient and they are unchanged from previous note.  ALLERGIES:  is allergic to sulfa antibiotics and sulfonamide derivatives.  MEDICATIONS:  Current Outpatient Prescriptions  Medication Sig Dispense Refill  . aspirin EC 81 MG tablet Take 162 mg by mouth daily.    Marland Kitchen CALCIUM CARB-CHOLECALCIFEROL PO Take 1,250 mg by mouth daily.    . Cyanocobalamin (VITAMIN B-12) 500 MCG SUBL Place 1 tablet under the tongue daily.     Marland Kitchen econazole nitrate 1 % cream Apply 1 application topically daily as needed.    . gabapentin (NEURONTIN) 300 MG capsule TAKE 2-3 CAPSULES (600-900 MG TOTAL) BY MOUTH 3 (THREE) TIMES DAILY AS NEEDED. 270 capsule 1  . ketoconazole (NIZORAL) 2 %  shampoo Apply 1 application topically as needed.     . Multiple Vitamin (MULTIVITAMIN) tablet Take 1 tablet by mouth daily.      . predniSONE (DELTASONE) 10 MG  tablet Take 10 mg by mouth daily with breakfast.    . timolol (TIMOPTIC) 0.5 % ophthalmic solution Place 1 drop into both eyes at bedtime. One drop in each eye at bedtime.    . triamcinolone (KENALOG) 0.025 % cream Apply 1 application topically as needed.    . methotrexate (RHEUMATREX) 10 MG tablet Take 1 tablet (10 mg total) by mouth once a week. Caution: Chemotherapy. Protect from light. 4 tablet 12  . ondansetron (ZOFRAN) 8 MG tablet Take 1 tablet (8 mg total) by mouth every 8 (eight) hours as needed for nausea. 30 tablet 3   No current facility-administered medications for this visit.     PHYSICAL EXAMINATION: ECOG PERFORMANCE STATUS: 2 - Symptomatic, <50% confined to bed  Vitals:   12/18/15 1102  BP: 135/81  Pulse: 75  Temp: 97.8 F (36.6 C)   Filed Weights   12/18/15 1102  Weight: 226 lb 6.6 oz (102.7 kg)    GENERAL:alert, no distress and comfortable SKIN: skin color, texture, turgor are normal, no rashes or significant lesions EYES: normal, Conjunctiva are pink and non-injected, sclera clear Musculoskeletal:no cyanosis of digits and no clubbing. Noted proximal myopathy NEURO: alert & oriented x 3 with fluent speech, no focal motor/sensory deficits  LABORATORY DATA:  I have reviewed the data as listed    Component Value Date/Time   NA 142 11/15/2015 0946   K 4.3 11/15/2015 0946   CL 106 11/15/2015 0946   CO2 29 11/15/2015 0946   GLUCOSE 105 (H) 11/15/2015 0946   BUN 17 11/15/2015 0946   CREATININE 1.29 11/15/2015 0946   CALCIUM 9.1 11/15/2015 0946   PROT 6.3 11/15/2015 0946   ALBUMIN 3.9 11/15/2015 0946   AST 39 (H) 11/15/2015 0946   ALT 57 (H) 11/15/2015 0946   ALKPHOS 74 11/15/2015 0946   BILITOT 0.8 11/15/2015 0946   GFRNONAA 65 (L) 05/20/2011 0645   GFRAA 75 (L) 05/20/2011 0645    No results found for: SPEP, UPEP  Lab Results  Component Value Date   WBC 6.8 12/11/2015   NEUTROABS 1.3 (L) 12/11/2015   HGB 11.1 (L) 12/11/2015   HCT 33.0 (L)  12/11/2015   MCV 112.2 (H) 12/11/2015   PLT 292 12/11/2015      Chemistry      Component Value Date/Time   NA 142 11/15/2015 0946   K 4.3 11/15/2015 0946   CL 106 11/15/2015 0946   CO2 29 11/15/2015 0946   BUN 17 11/15/2015 0946   CREATININE 1.29 11/15/2015 0946      Component Value Date/Time   CALCIUM 9.1 11/15/2015 0946   ALKPHOS 74 11/15/2015 0946   AST 39 (H) 11/15/2015 0946   ALT 57 (H) 11/15/2015 0946   BILITOT 0.8 11/15/2015 0946      ASSESSMENT & PLAN:  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 We discussed the natural history of T-cell large granular lymphocytic leukemia. This is a rare condition but could be associated with autoimmune process. I suspect his diagnosis of CIDP could be linked. We reviewed the guidelines. His most recent CBC with differential showed that he has evidence of neutropenia. He has progressive CIDP despite being on prednisone therapy. We discussed possibility of referring him to tertiary referral center for  second opinion. I recommend starting him on treatment with weekly dose of methotrexate, 10 mg once a week on Wednesdays. We discussed the risks, benefit, side effects of methotrexate including risk of nausea, mucositis, pancytopenia, liver function test abnormality and others and he agreed to proceed. I will bring him back once a week for blood count monitoring and see him back in 4 weeks for toxicity review.  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 He is already started on vitamin B-12 supplement. 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 Once he gets stronger, we will consider a slow prednisone taper in the future along with outpatient physical therapy   Orders Placed This Encounter  Procedures  . CBC with Differential/Platelet    Standing Status:   Standing    Number of Occurrences:   22    Standing Expiration Date:   12/17/2016  . Comprehensive metabolic panel    Standing Status:   Standing    Number of Occurrences:   22    Standing Expiration Date:   12/17/2016   All questions were answered. The patient knows to call the clinic with any problems, questions or concerns. No barriers to learning was detected. I spent 40 minutes counseling the patient face to face. The total time spent in the appointment was 60 minutes and more than 50% was on counseling and review of test results     Heath Lark, MD 12/18/2015 12:34 PM

## 2015-12-18 NOTE — Assessment & Plan Note (Signed)
I had a long discussion with the patient and his family. We discussed the natural history of T-cell large granular lymphocytic leukemia. This is a rare condition but could be associated with autoimmune process. I suspect his diagnosis of CIDP could be linked. We reviewed the guidelines. His most recent CBC with differential showed that he has evidence of neutropenia. He has progressive CIDP despite being on prednisone therapy. We discussed possibility of referring him to tertiary referral center for second opinion. I recommend starting him on treatment with weekly dose of methotrexate, 10 mg once a week on Wednesdays. We discussed the risks, benefit, side effects of methotrexate including risk of nausea, mucositis, pancytopenia, liver function test abnormality and others and he agreed to proceed. I will bring him back once a week for blood count monitoring and see him back in 4 weeks for toxicity review.

## 2015-12-18 NOTE — Assessment & Plan Note (Signed)
This is related to the LGL process He is not symptomatic from anemia Even though iron store on his bone marrow was reduced, his recent iron panel from blood draw is most consistent with anemia of chronic illness. He is already started on vitamin B-12 supplement. I recommend folic acid supplement as he will be on chronic methotrexate therapy

## 2015-12-18 NOTE — Assessment & Plan Note (Signed)
The patient has MGUS. Bone marrow biopsy revealed 5-10% plasma cells, insignificant I recommend close observation of his MGUS with blood work monitoring once a year

## 2015-12-18 NOTE — Assessment & Plan Note (Signed)
The patient has worsening neuropathy despite being on prednisone therapy T-cell LGL can be associated with autoimmune disorder I recommend he remain on prednisone therapy for now

## 2015-12-18 NOTE — Assessment & Plan Note (Signed)
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 Once he gets stronger, we will consider a slow prednisone taper in the future along with outpatient physical therapy

## 2015-12-23 ENCOUNTER — Telehealth: Payer: Self-pay

## 2015-12-23 ENCOUNTER — Ambulatory Visit: Payer: Self-pay

## 2015-12-23 NOTE — Telephone Encounter (Signed)
I called her back.  I thank all involved.  She'll pass message along to patient.

## 2015-12-23 NOTE — Telephone Encounter (Signed)
[name] pts wife (DPR signed) wanted Dr Damita Dunnings to know  That pt is going to a oncologist/hematologist. Pt diagnosed with chronic leukemia. [name] wanted to give special thanks to Dr Damita Dunnings for all he has done. FYI to Dr Damita Dunnings.

## 2015-12-25 ENCOUNTER — Telehealth: Payer: Self-pay | Admitting: *Deleted

## 2015-12-25 ENCOUNTER — Inpatient Hospital Stay: Payer: Medicare Other

## 2015-12-25 DIAGNOSIS — C91Z0 Other lymphoid leukemia not having achieved remission: Secondary | ICD-10-CM | POA: Diagnosis not present

## 2015-12-25 DIAGNOSIS — D479 Neoplasm of uncertain behavior of lymphoid, hematopoietic and related tissue, unspecified: Secondary | ICD-10-CM

## 2015-12-25 LAB — CBC WITH DIFFERENTIAL/PLATELET
Basophils Absolute: 0 10*3/uL (ref 0–0.1)
Basophils Relative: 1 %
EOS ABS: 0.2 10*3/uL (ref 0–0.7)
EOS PCT: 3 %
HCT: 32.3 % — ABNORMAL LOW (ref 40.0–52.0)
Hemoglobin: 11.1 g/dL — ABNORMAL LOW (ref 13.0–18.0)
LYMPHS ABS: 3.7 10*3/uL — AB (ref 1.0–3.6)
Lymphocytes Relative: 56 %
MCH: 38.4 pg — AB (ref 26.0–34.0)
MCHC: 34.5 g/dL (ref 32.0–36.0)
MCV: 111.3 fL — ABNORMAL HIGH (ref 80.0–100.0)
MONOS PCT: 6 %
Monocytes Absolute: 0.4 10*3/uL (ref 0.2–1.0)
Neutro Abs: 2.3 10*3/uL (ref 1.4–6.5)
Neutrophils Relative %: 34 %
PLATELETS: 282 10*3/uL (ref 150–440)
RBC: 2.9 MIL/uL — ABNORMAL LOW (ref 4.40–5.90)
RDW: 16.2 % — ABNORMAL HIGH (ref 11.5–14.5)
WBC: 6.7 10*3/uL (ref 3.8–10.6)

## 2015-12-25 LAB — COMPREHENSIVE METABOLIC PANEL
ALT: 66 U/L — ABNORMAL HIGH (ref 17–63)
ANION GAP: 7 (ref 5–15)
AST: 49 U/L — ABNORMAL HIGH (ref 15–41)
Albumin: 3.9 g/dL (ref 3.5–5.0)
Alkaline Phosphatase: 82 U/L (ref 38–126)
BUN: 17 mg/dL (ref 6–20)
CALCIUM: 8.6 mg/dL — AB (ref 8.9–10.3)
CHLORIDE: 107 mmol/L (ref 101–111)
CO2: 24 mmol/L (ref 22–32)
Creatinine, Ser: 1.07 mg/dL (ref 0.61–1.24)
GFR calc non Af Amer: >60 mL/min (ref >60)
Glucose, Bld: 111 mg/dL — ABNORMAL HIGH (ref 65–99)
POTASSIUM: 3.8 mmol/L (ref 3.5–5.1)
SODIUM: 138 mmol/L (ref 135–145)
Total Bilirubin: 0.8 mg/dL (ref 0.3–1.2)
Total Protein: 6.4 g/dL — ABNORMAL LOW (ref 6.5–8.1)

## 2015-12-25 LAB — FERRITIN: Ferritin: 193 ng/mL (ref 24–336)

## 2015-12-25 NOTE — Telephone Encounter (Signed)
Called the house and wife answered and took info that labs are stable and he should cont. Methotrexate same dose.wife will pass message along and she verfied his next 3 appt on the phone with me

## 2016-01-01 ENCOUNTER — Inpatient Hospital Stay: Payer: Medicare Other

## 2016-01-01 ENCOUNTER — Telehealth: Payer: Self-pay

## 2016-01-01 DIAGNOSIS — C91Z0 Other lymphoid leukemia not having achieved remission: Secondary | ICD-10-CM

## 2016-01-01 LAB — CBC WITH DIFFERENTIAL/PLATELET
BASOS ABS: 0 10*3/uL (ref 0–0.1)
BASOS PCT: 0 %
EOS ABS: 0.2 10*3/uL (ref 0–0.7)
EOS PCT: 3 %
HEMATOCRIT: 30.3 % — AB (ref 40.0–52.0)
Hemoglobin: 10.6 g/dL — ABNORMAL LOW (ref 13.0–18.0)
Lymphocytes Relative: 64 %
Lymphs Abs: 4.3 10*3/uL — ABNORMAL HIGH (ref 1.0–3.6)
MCH: 38.9 pg — ABNORMAL HIGH (ref 26.0–34.0)
MCHC: 34.9 g/dL (ref 32.0–36.0)
MCV: 111.4 fL — ABNORMAL HIGH (ref 80.0–100.0)
MONO ABS: 0.4 10*3/uL (ref 0.2–1.0)
MONOS PCT: 7 %
Neutro Abs: 1.8 10*3/uL (ref 1.4–6.5)
Neutrophils Relative %: 26 %
PLATELETS: 278 10*3/uL (ref 150–440)
RBC: 2.72 MIL/uL — ABNORMAL LOW (ref 4.40–5.90)
RDW: 15.7 % — AB (ref 11.5–14.5)
WBC: 6.7 10*3/uL (ref 3.8–10.6)

## 2016-01-01 LAB — COMPREHENSIVE METABOLIC PANEL
ALBUMIN: 3.7 g/dL (ref 3.5–5.0)
ALT: 61 U/L (ref 17–63)
ANION GAP: 7 (ref 5–15)
AST: 40 U/L (ref 15–41)
Alkaline Phosphatase: 75 U/L (ref 38–126)
BILIRUBIN TOTAL: 0.7 mg/dL (ref 0.3–1.2)
BUN: 18 mg/dL (ref 6–20)
CHLORIDE: 105 mmol/L (ref 101–111)
CO2: 26 mmol/L (ref 22–32)
Calcium: 8.7 mg/dL — ABNORMAL LOW (ref 8.9–10.3)
Creatinine, Ser: 1.13 mg/dL (ref 0.61–1.24)
GFR calc Af Amer: >60 mL/min (ref >60)
GFR calc non Af Amer: >60 mL/min (ref >60)
GLUCOSE: 133 mg/dL — AB (ref 65–99)
POTASSIUM: 3.6 mmol/L (ref 3.5–5.1)
Sodium: 138 mmol/L (ref 135–145)
TOTAL PROTEIN: 6.1 g/dL — AB (ref 6.5–8.1)

## 2016-01-01 NOTE — Telephone Encounter (Signed)
Wife informed

## 2016-01-01 NOTE — Telephone Encounter (Signed)
I have patients undergo Mohs procedure on both prednisone and Methotrexate Since the procedure is superficial, most patients do OK but everyone is different From my stand point, he can proceed

## 2016-01-01 NOTE — Telephone Encounter (Signed)
Patient's wife informed that patient is to continue current dose of Methotrexate.  She wanted to let Dr. Alvy Bimler know that Mr. [name] is scheduled for MOHS procedure for area on nose on 01/09/16 at [facility] by Dr. Anne Fu.  She wanted Dr. Calton Dach opinion about getting MOHS procedure while taking Methotrexate.  He's has been evaluated by Dr. Manley Mason before procedure was scheduled.

## 2016-01-08 ENCOUNTER — Other Ambulatory Visit: Payer: Self-pay | Admitting: *Deleted

## 2016-01-08 ENCOUNTER — Telehealth: Payer: Self-pay | Admitting: *Deleted

## 2016-01-08 ENCOUNTER — Inpatient Hospital Stay: Payer: Medicare Other | Attending: Hematology and Oncology

## 2016-01-08 DIAGNOSIS — T380X5A Adverse effect of glucocorticoids and synthetic analogues, initial encounter: Secondary | ICD-10-CM | POA: Insufficient documentation

## 2016-01-08 DIAGNOSIS — G720 Drug-induced myopathy: Secondary | ICD-10-CM | POA: Insufficient documentation

## 2016-01-08 DIAGNOSIS — B0229 Other postherpetic nervous system involvement: Secondary | ICD-10-CM | POA: Diagnosis not present

## 2016-01-08 DIAGNOSIS — C44311 Basal cell carcinoma of skin of nose: Secondary | ICD-10-CM | POA: Diagnosis not present

## 2016-01-08 DIAGNOSIS — Z79899 Other long term (current) drug therapy: Secondary | ICD-10-CM | POA: Diagnosis not present

## 2016-01-08 DIAGNOSIS — G6181 Chronic inflammatory demyelinating polyneuritis: Secondary | ICD-10-CM | POA: Insufficient documentation

## 2016-01-08 DIAGNOSIS — Z7982 Long term (current) use of aspirin: Secondary | ICD-10-CM | POA: Diagnosis not present

## 2016-01-08 DIAGNOSIS — D539 Nutritional anemia, unspecified: Secondary | ICD-10-CM | POA: Insufficient documentation

## 2016-01-08 DIAGNOSIS — C91Z0 Other lymphoid leukemia not having achieved remission: Secondary | ICD-10-CM | POA: Diagnosis present

## 2016-01-08 LAB — CBC WITH DIFFERENTIAL/PLATELET
Basophils Absolute: 0 10*3/uL (ref 0–0.1)
Basophils Relative: 1 %
Eosinophils Absolute: 0.2 10*3/uL (ref 0–0.7)
Eosinophils Relative: 3 %
HEMATOCRIT: 30.9 % — AB (ref 40.0–52.0)
HEMOGLOBIN: 10.6 g/dL — AB (ref 13.0–18.0)
LYMPHS ABS: 4 10*3/uL — AB (ref 1.0–3.6)
Lymphocytes Relative: 61 %
MCH: 38.3 pg — AB (ref 26.0–34.0)
MCHC: 34.4 g/dL (ref 32.0–36.0)
MCV: 111.2 fL — AB (ref 80.0–100.0)
MONO ABS: 0.5 10*3/uL (ref 0.2–1.0)
MONOS PCT: 8 %
NEUTROS ABS: 1.8 10*3/uL (ref 1.4–6.5)
NEUTROS PCT: 27 %
Platelets: 311 10*3/uL (ref 150–440)
RBC: 2.78 MIL/uL — ABNORMAL LOW (ref 4.40–5.90)
RDW: 16.5 % — ABNORMAL HIGH (ref 11.5–14.5)
WBC: 6.5 10*3/uL (ref 3.8–10.6)

## 2016-01-08 LAB — COMPREHENSIVE METABOLIC PANEL
ALK PHOS: 72 U/L (ref 38–126)
ALT: 62 U/L (ref 17–63)
ANION GAP: 6 (ref 5–15)
AST: 50 U/L — ABNORMAL HIGH (ref 15–41)
Albumin: 3.9 g/dL (ref 3.5–5.0)
BILIRUBIN TOTAL: 0.9 mg/dL (ref 0.3–1.2)
BUN: 17 mg/dL (ref 6–20)
CALCIUM: 9 mg/dL (ref 8.9–10.3)
CO2: 26 mmol/L (ref 22–32)
Chloride: 105 mmol/L (ref 101–111)
Creatinine, Ser: 1.21 mg/dL (ref 0.61–1.24)
GFR, EST NON AFRICAN AMERICAN: 57 mL/min — AB (ref >60)
GLUCOSE: 115 mg/dL — AB (ref 65–99)
Potassium: 3.9 mmol/L (ref 3.5–5.1)
Sodium: 137 mmol/L (ref 135–145)
TOTAL PROTEIN: 6.5 g/dL (ref 6.5–8.1)

## 2016-01-08 MED ORDER — PREDNISONE 10 MG PO TABS: 10.0000 mg | ORAL_TABLET | Freq: Every day | ORAL | 3 refills | Status: DC

## 2016-01-08 NOTE — Telephone Encounter (Signed)
-----   Message from Heath Lark, MD sent at 01/08/2016 11:37 AM EST ----- Regarding: labs Pls call patient Labs look OK ----- Message ----- From: Buel Ream, Lab In [place] Sent: 01/08/2016  10:37 AM To: Heath Lark, MD

## 2016-01-08 NOTE — Telephone Encounter (Signed)
Requesting a refill on Prednisone stating Dr Alvy Bimler told them she would refill it for him. Please advise

## 2016-01-08 NOTE — Telephone Encounter (Signed)
Received refill electronically Last refill 10/31/15 #270/1 refill Last office visit 11/15/15

## 2016-01-08 NOTE — Telephone Encounter (Signed)
Pt notified of message below.

## 2016-01-08 NOTE — Telephone Encounter (Signed)
-----   Message from Reeves Dam sent at 01/08/2016 10:31 AM EST ----- pts wife stated [name] told her she would give them another refill on prednisone can you take care of this for me she came to my desk. Thanks

## 2016-01-09 ENCOUNTER — Other Ambulatory Visit: Payer: Self-pay | Admitting: *Deleted

## 2016-01-09 HISTORY — PX: MOHS SURGERY: SUR867

## 2016-01-09 MED ORDER — GABAPENTIN 300 MG PO CAPS: ORAL_CAPSULE | ORAL | 1 refills | Status: DC

## 2016-01-09 NOTE — Telephone Encounter (Signed)
Faxed refill request. Last Filled:     270 capsule 1 01/09/2016  Last office visit:   11/15/15  Please advise.

## 2016-01-09 NOTE — Telephone Encounter (Signed)
Sent. Thanks.   

## 2016-01-10 NOTE — Telephone Encounter (Signed)
I think this went through yesterday.  Thanks.

## 2016-01-15 ENCOUNTER — Inpatient Hospital Stay (HOSPITAL_BASED_OUTPATIENT_CLINIC_OR_DEPARTMENT_OTHER): Payer: Medicare Other

## 2016-01-15 ENCOUNTER — Inpatient Hospital Stay (HOSPITAL_BASED_OUTPATIENT_CLINIC_OR_DEPARTMENT_OTHER): Payer: Medicare Other | Admitting: Hematology and Oncology

## 2016-01-15 ENCOUNTER — Encounter: Payer: Self-pay | Admitting: Hematology and Oncology

## 2016-01-15 ENCOUNTER — Telehealth: Payer: Self-pay | Admitting: *Deleted

## 2016-01-15 VITALS — BP 107/76 | HR 76 | Temp 97.8°F | Ht 76.97 in | Wt 232.6 lb

## 2016-01-15 DIAGNOSIS — G720 Drug-induced myopathy: Secondary | ICD-10-CM

## 2016-01-15 DIAGNOSIS — C4491 Basal cell carcinoma of skin, unspecified: Secondary | ICD-10-CM | POA: Insufficient documentation

## 2016-01-15 DIAGNOSIS — D539 Nutritional anemia, unspecified: Secondary | ICD-10-CM

## 2016-01-15 DIAGNOSIS — Z7982 Long term (current) use of aspirin: Secondary | ICD-10-CM

## 2016-01-15 DIAGNOSIS — C91Z0 Other lymphoid leukemia not having achieved remission: Secondary | ICD-10-CM

## 2016-01-15 DIAGNOSIS — C44311 Basal cell carcinoma of skin of nose: Secondary | ICD-10-CM

## 2016-01-15 DIAGNOSIS — G6181 Chronic inflammatory demyelinating polyneuritis: Secondary | ICD-10-CM | POA: Diagnosis not present

## 2016-01-15 DIAGNOSIS — B0229 Other postherpetic nervous system involvement: Secondary | ICD-10-CM | POA: Insufficient documentation

## 2016-01-15 DIAGNOSIS — Z79899 Other long term (current) drug therapy: Secondary | ICD-10-CM

## 2016-01-15 DIAGNOSIS — D479 Neoplasm of uncertain behavior of lymphoid, hematopoietic and related tissue, unspecified: Secondary | ICD-10-CM

## 2016-01-15 DIAGNOSIS — T380X5A Adverse effect of glucocorticoids and synthetic analogues, initial encounter: Secondary | ICD-10-CM

## 2016-01-15 LAB — COMPREHENSIVE METABOLIC PANEL
ALBUMIN: 3.7 g/dL (ref 3.5–5.0)
ALK PHOS: 79 U/L (ref 38–126)
ALT: 63 U/L (ref 17–63)
ANION GAP: 8 (ref 5–15)
AST: 50 U/L — ABNORMAL HIGH (ref 15–41)
BUN: 14 mg/dL (ref 6–20)
CALCIUM: 8.7 mg/dL — AB (ref 8.9–10.3)
CHLORIDE: 104 mmol/L (ref 101–111)
CO2: 24 mmol/L (ref 22–32)
Creatinine, Ser: 1.2 mg/dL (ref 0.61–1.24)
GFR calc Af Amer: >60 mL/min (ref >60)
GFR calc non Af Amer: 57 mL/min — ABNORMAL LOW (ref >60)
GLUCOSE: 171 mg/dL — AB (ref 65–99)
POTASSIUM: 3.6 mmol/L (ref 3.5–5.1)
SODIUM: 136 mmol/L (ref 135–145)
Total Bilirubin: 0.8 mg/dL (ref 0.3–1.2)
Total Protein: 6.2 g/dL — ABNORMAL LOW (ref 6.5–8.1)

## 2016-01-15 LAB — CBC WITH DIFFERENTIAL/PLATELET
BASOS PCT: 0 %
Basophils Absolute: 0 10*3/uL (ref 0–0.1)
EOS ABS: 0.2 10*3/uL (ref 0–0.7)
Eosinophils Relative: 3 %
HCT: 30.8 % — ABNORMAL LOW (ref 40.0–52.0)
HEMOGLOBIN: 10.7 g/dL — AB (ref 13.0–18.0)
Lymphocytes Relative: 65 %
Lymphs Abs: 4.2 10*3/uL — ABNORMAL HIGH (ref 1.0–3.6)
MCH: 39.1 pg — ABNORMAL HIGH (ref 26.0–34.0)
MCHC: 34.6 g/dL (ref 32.0–36.0)
MCV: 112.8 fL — ABNORMAL HIGH (ref 80.0–100.0)
MONOS PCT: 8 %
Monocytes Absolute: 0.5 10*3/uL (ref 0.2–1.0)
NEUTROS PCT: 24 %
Neutro Abs: 1.5 10*3/uL (ref 1.4–6.5)
Platelets: 327 10*3/uL (ref 150–440)
RBC: 2.73 MIL/uL — ABNORMAL LOW (ref 4.40–5.90)
RDW: 16.9 % — ABNORMAL HIGH (ref 11.5–14.5)
WBC: 6.4 10*3/uL (ref 3.8–10.6)

## 2016-01-15 MED ORDER — METHOTREXATE SODIUM 2.5 MG PO TABS: 12.5000 mg | ORAL_TABLET | ORAL | 11 refills | Status: DC

## 2016-01-15 NOTE — Assessment & Plan Note (Signed)
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 He is also quite sedated from multiple different medications. I recommend a trial of reducing gabapentin. Currently, he is taking 600 mg in the morning, 300 mg in the afternoon and 600 mg at night. I recommend a trial of omitting the afternoon dose and just keep at 600 mg twice a day

## 2016-01-15 NOTE — Assessment & Plan Note (Signed)
This is related to the LGL process He is not symptomatic from anemia Even though iron store on his bone marrow was reduced, his recent iron panel from blood draw is most consistent with anemia of chronic illness. He is already started on vitamin B-12 supplement. I recommend folic acid supplement as he will be on chronic methotrexate therapy

## 2016-01-15 NOTE — Assessment & Plan Note (Signed)
I have a long discussion with the patient and family. He tolerated methotrexate well with just minor mucositis and change in bowel habits which has since resolved after second week of treatment. I plan to increase the dose of methotrexate to 12.5 mg once a week and see him back in the month for further toxicity review

## 2016-01-15 NOTE — Progress Notes (Signed)
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 T-cell receptor rearrangement study is still pending      12/19/2015 -  Chemotherapy    The patient was started on 10 mg once a week methotrexate. The dose is increased to 12.5 mg starting 01/16/16        INTERVAL HISTORY: Please see below for problem oriented charting. He returns today with his wife and daughter. Within the first week of starting methotrexate treatment, he complained of some mild mucositis and mild loose stool. Mucositis and loose stool episodes had resolved. He has not received physical therapy yet. He had recent Mohs procedure on his face to on 01/09/2016. His wound appeared to be healing well. He denies excessive bleeding. He denies nausea from recent treatment. No recent infection He complained of profound fatigue within the first 3 days after each week of methotrexate treatment. He also feels sedated during daytime. He continues to take high-dose Gabapentin for postherpetic neuralgia. He is able to tolerate recent dose adjustment of gabapentin. He rated his pain at around 5 out of 10  REVIEW OF SYSTEMS:   Constitutional: Denies fevers, chills or abnormal weight loss Eyes: Denies blurriness of vision Respiratory: Denies cough, dyspnea or wheezes Cardiovascular: Denies palpitation, chest discomfort  Skin: Denies abnormal skin rashes Lymphatics: Denies new lymphadenopathy or easy bruising Behavioral/Psych: Mood is stable, no new changes  All other systems were reviewed with the patient and are negative.  I have reviewed the past medical history, past surgical history, social history and family history with the patient and they are unchanged from previous note.  ALLERGIES:  is  allergic to sulfa antibiotics and sulfonamide derivatives.  MEDICATIONS:  Current Outpatient Prescriptions  Medication Sig Dispense Refill  . acetaminophen (TYLENOL) 325 MG tablet Take 650 mg by mouth every 6 (six) hours as needed.    Marland Kitchen aspirin EC 81 MG tablet Take 162 mg by mouth daily.    Marland Kitchen CALCIUM CARB-CHOLECALCIFEROL PO Take 1,250 mg by mouth daily.    . Cyanocobalamin (VITAMIN B-12) 500 MCG SUBL Place 1 tablet under the tongue daily.     Marland Kitchen econazole nitrate 1 % cream Apply 1 application topically daily as needed.    Marland Kitchen ketoconazole (NIZORAL) 2 % shampoo Apply 1 application topically as needed.     . methotrexate 2.5 MG tablet Take 5 tablets (12.5 mg total) by mouth once a week. Caution: Chemotherapy. Protect from light. 30 tablet 11  . Multiple Vitamin (MULTIVITAMIN) tablet Take 1 tablet by mouth daily.      . ondansetron (ZOFRAN) 8 MG tablet Take 1 tablet (8 mg total) by mouth every 8 (eight) hours as needed for nausea. 30 tablet 3  . predniSONE (DELTASONE) 10 MG tablet Take 1 tablet (10 mg total) by mouth daily with breakfast. 90 tablet 3  . timolol (TIMOPTIC) 0.5 % ophthalmic solution Place 1 drop into both eyes at bedtime. One drop in each eye at bedtime.    . triamcinolone (KENALOG) 0.025 % cream Apply 1 application topically as needed.    . gabapentin (NEURONTIN) 300 MG capsule TAKE 2-3 CAPSULES (600-900 MG TOTAL) BY MOUTH 3 (THREE) TIMES DAILY AS NEEDED. 270 capsule 1   No current facility-administered medications for this visit.  PHYSICAL EXAMINATION: ECOG PERFORMANCE STATUS: 2 - Symptomatic, <50% confined to bed  Vitals:   01/15/16 0945  BP: 107/76  Pulse: 76  Temp: 97.8 F (36.6 C)   Filed Weights   01/15/16 0945  Weight: 232 lb 9.4 oz (105.5 kg)    GENERAL:alert, no distress and comfortable SKIN: Well-healed scar on the left side of his face from recent surgery. EYES: normal, Conjunctiva are pink and non-injected, sclera clear OROPHARYNX:no exudate, no  erythema and lips, buccal mucosa, and tongue normal  NECK: supple, thyroid normal size, non-tender, without nodularity LYMPH:  no palpable lymphadenopathy in the cervical, axillary or inguinal LUNGS: clear to auscultation and percussion with normal breathing effort HEART: regular rate & rhythm and no murmurs With moderate bilateral lower extremity edema ABDOMEN:abdomen soft, non-tender and normal bowel sounds Musculoskeletal:no cyanosis of digits and no clubbing  NEURO: alert & oriented x 3 with fluent speech, no focal motor/sensory deficits  LABORATORY DATA:  I have reviewed the data as listed    Component Value Date/Time   NA 136 01/15/2016 0923   K 3.6 01/15/2016 0923   CL 104 01/15/2016 0923   CO2 24 01/15/2016 0923   GLUCOSE 171 (H) 01/15/2016 0923   BUN 14 01/15/2016 0923   CREATININE 1.20 01/15/2016 0923   CALCIUM 8.7 (L) 01/15/2016 0923   PROT 6.2 (L) 01/15/2016 0923   ALBUMIN 3.7 01/15/2016 0923   AST 50 (H) 01/15/2016 0923   ALT 63 01/15/2016 0923   ALKPHOS 79 01/15/2016 0923   BILITOT 0.8 01/15/2016 0923   GFRNONAA 57 (L) 01/15/2016 0923   GFRAA >60 01/15/2016 0923    No results found for: SPEP, UPEP  Lab Results  Component Value Date   WBC 6.4 01/15/2016   NEUTROABS 1.5 01/15/2016   HGB 10.7 (L) 01/15/2016   HCT 30.8 (L) 01/15/2016   MCV 112.8 (H) 01/15/2016   PLT 327 01/15/2016      Chemistry      Component Value Date/Time   NA 136 01/15/2016 0923   K 3.6 01/15/2016 0923   CL 104 01/15/2016 0923   CO2 24 01/15/2016 0923   BUN 14 01/15/2016 0923   CREATININE 1.20 01/15/2016 0923      Component Value Date/Time   CALCIUM 8.7 (L) 01/15/2016 0923   ALKPHOS 79 01/15/2016 0923   AST 50 (H) 01/15/2016 0923   ALT 63 01/15/2016 0923   BILITOT 0.8 01/15/2016 0923     ASSESSMENT & PLAN:  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 He tolerated methotrexate well with just minor mucositis and change in  bowel habits which has since resolved after second week of treatment. 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 Once he gets stronger, we will consider a slow prednisone taper in the future along with outpatient physical therapy  Basal cell carcinoma of skin He has surgical resection on 01/09/2016. His wound appears to be healing  well  Macrocytic anemia This is related to the LGL process He is not symptomatic from anemia Even though iron store on his bone marrow was reduced, his recent iron panel from blood draw is most consistent with anemia of chronic illness. He is already started on vitamin B-12 supplement. I recommend folic acid supplement as he will be on chronic methotrexate therapy  Postherpetic neuralgia He has significant 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 He is also quite sedated from multiple different medications. I recommend a trial of reducing gabapentin. Currently, he is taking 600 mg in the morning, 300 mg in the afternoon and 600 mg at night. I recommend a trial of omitting the afternoon dose and just keep at 600 mg twice a day   No orders of the defined types were placed in this encounter.  All questions were answered. The patient knows to call the clinic with any problems, questions or concerns. No barriers to learning was detected. I spent  20 minutes counseling the patient face to face. The total time spent in the appointment was 30 minutes and more than 50% was on counseling and review of test results     Heath Lark, MD 01/15/2016 10:26 AM

## 2016-01-15 NOTE — Assessment & Plan Note (Signed)
He has surgical resection on 01/09/2016. His wound appears to be healing well

## 2016-01-15 NOTE — Telephone Encounter (Signed)
Alvy Bimler had increased pt to 12.5 mg once a week from 10 mg.  She sent rx for 2.5 mg and pt could take 1 10 mg tablet with 2.5 mg tablet to equal 12.5 mg total.  When I called the pharmacy I spoke to [place] and he states that 4 tablets of 10 mg costed $60.00 and if she prescribes 5 tablets of 2.5 mg to equal 12.5 mg once a week.  The cost for it will be 17.00.  [facility] will accept the bottle of 10 mg that the pt picked up this week.  I called pt home and cell and got voice mail and explained that the 10 mg is more expensive and pharmacy will take it back and they can get 5 tablets of 2.5 mg to equal 12.5 mg dose and it will be cheaper.  The wife called me back and I explained it again and she understands and she will take the old 10 mg bottle back and get new dose 2.5 mg that he will take 5 tablets once a week.    I have also ordered physical [facility] for pt at [facility].  Dr Alvy Bimler ordered the therapy and I faxed demo, ins. Card, notes and order to [phone]  I did speak to Corene Cornea who is out contact for this hosp. With [facility] and that is what he told me to do to get started

## 2016-01-15 NOTE — Assessment & Plan Note (Signed)
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 Once he gets stronger, we will consider a slow prednisone taper in the future along with outpatient physical therapy

## 2016-01-15 NOTE — Assessment & Plan Note (Signed)
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

## 2016-01-17 DIAGNOSIS — M6281 Muscle weakness (generalized): Secondary | ICD-10-CM | POA: Diagnosis not present

## 2016-01-17 DIAGNOSIS — G720 Drug-induced myopathy: Secondary | ICD-10-CM | POA: Diagnosis not present

## 2016-01-17 DIAGNOSIS — G6181 Chronic inflammatory demyelinating polyneuritis: Secondary | ICD-10-CM | POA: Diagnosis not present

## 2016-01-17 DIAGNOSIS — C91Z0 Other lymphoid leukemia not having achieved remission: Secondary | ICD-10-CM | POA: Diagnosis not present

## 2016-01-29 ENCOUNTER — Telehealth: Payer: Self-pay | Admitting: *Deleted

## 2016-01-29 ENCOUNTER — Inpatient Hospital Stay: Payer: Medicare Other

## 2016-01-29 DIAGNOSIS — C91Z0 Other lymphoid leukemia not having achieved remission: Secondary | ICD-10-CM

## 2016-01-29 LAB — CBC WITH DIFFERENTIAL/PLATELET
BASOS ABS: 0 10*3/uL (ref 0–0.1)
Basophils Relative: 1 %
EOS ABS: 0.2 10*3/uL (ref 0–0.7)
EOS PCT: 4 %
HCT: 30.6 % — ABNORMAL LOW (ref 40.0–52.0)
Hemoglobin: 10.6 g/dL — ABNORMAL LOW (ref 13.0–18.0)
LYMPHS ABS: 2.8 10*3/uL (ref 1.0–3.6)
Lymphocytes Relative: 50 %
MCH: 39.1 pg — AB (ref 26.0–34.0)
MCHC: 34.7 g/dL (ref 32.0–36.0)
MCV: 112.7 fL — AB (ref 80.0–100.0)
MONO ABS: 0.4 10*3/uL (ref 0.2–1.0)
Monocytes Relative: 7 %
Neutro Abs: 2.1 10*3/uL (ref 1.4–6.5)
Neutrophils Relative %: 38 %
PLATELETS: 321 10*3/uL (ref 150–440)
RBC: 2.72 MIL/uL — AB (ref 4.40–5.90)
RDW: 17.3 % — AB (ref 11.5–14.5)
WBC: 5.6 10*3/uL (ref 3.8–10.6)

## 2016-01-29 LAB — COMPREHENSIVE METABOLIC PANEL
ALT: 57 U/L (ref 17–63)
AST: 43 U/L — ABNORMAL HIGH (ref 15–41)
Albumin: 3.7 g/dL (ref 3.5–5.0)
Alkaline Phosphatase: 76 U/L (ref 38–126)
Anion gap: 4 — ABNORMAL LOW (ref 5–15)
BUN: 16 mg/dL (ref 6–20)
CHLORIDE: 106 mmol/L (ref 101–111)
CO2: 27 mmol/L (ref 22–32)
CREATININE: 1.18 mg/dL (ref 0.61–1.24)
Calcium: 8.7 mg/dL — ABNORMAL LOW (ref 8.9–10.3)
GFR, EST NON AFRICAN AMERICAN: 59 mL/min — AB (ref >60)
Glucose, Bld: 180 mg/dL — ABNORMAL HIGH (ref 65–99)
POTASSIUM: 3.6 mmol/L (ref 3.5–5.1)
SODIUM: 137 mmol/L (ref 135–145)
Total Bilirubin: 0.9 mg/dL (ref 0.3–1.2)
Total Protein: 6.3 g/dL — ABNORMAL LOW (ref 6.5–8.1)

## 2016-01-29 NOTE — Telephone Encounter (Signed)
Informed pt of Dr. Calton Dach message.  He verbalized understanding and will continue same dose MTX.

## 2016-01-29 NOTE — Telephone Encounter (Signed)
-----   Message from Heath Lark, MD sent at 01/29/2016 10:02 AM EST ----- Regarding: labs OK This is a patient of mine seen at [facility] on weekly methotrexate Please let him know labs look OK today Continue same dose MTX ----- Message ----- From: Buel Ream, Lab In [place] Sent: 01/29/2016   9:30 AM To: Heath Lark, MD

## 2016-02-12 ENCOUNTER — Encounter: Payer: Self-pay | Admitting: Hematology and Oncology

## 2016-02-12 ENCOUNTER — Inpatient Hospital Stay: Payer: Medicare Other

## 2016-02-12 ENCOUNTER — Inpatient Hospital Stay: Payer: Medicare Other | Attending: Hematology and Oncology | Admitting: Hematology and Oncology

## 2016-02-12 DIAGNOSIS — C91Z0 Other lymphoid leukemia not having achieved remission: Secondary | ICD-10-CM

## 2016-02-12 DIAGNOSIS — D539 Nutritional anemia, unspecified: Secondary | ICD-10-CM | POA: Diagnosis not present

## 2016-02-12 DIAGNOSIS — D472 Monoclonal gammopathy: Secondary | ICD-10-CM | POA: Diagnosis not present

## 2016-02-12 DIAGNOSIS — B0229 Other postherpetic nervous system involvement: Secondary | ICD-10-CM

## 2016-02-12 DIAGNOSIS — G720 Drug-induced myopathy: Secondary | ICD-10-CM | POA: Diagnosis not present

## 2016-02-12 DIAGNOSIS — T380X5A Adverse effect of glucocorticoids and synthetic analogues, initial encounter: Secondary | ICD-10-CM

## 2016-02-12 DIAGNOSIS — G6181 Chronic inflammatory demyelinating polyneuritis: Secondary | ICD-10-CM

## 2016-02-12 DIAGNOSIS — Z79899 Other long term (current) drug therapy: Secondary | ICD-10-CM | POA: Diagnosis not present

## 2016-02-12 LAB — COMPREHENSIVE METABOLIC PANEL
ALK PHOS: 70 U/L (ref 38–126)
ALT: 60 U/L (ref 17–63)
AST: 53 U/L — AB (ref 15–41)
Albumin: 3.7 g/dL (ref 3.5–5.0)
Anion gap: 7 (ref 5–15)
BUN: 18 mg/dL (ref 6–20)
CALCIUM: 8.9 mg/dL (ref 8.9–10.3)
CHLORIDE: 107 mmol/L (ref 101–111)
CO2: 24 mmol/L (ref 22–32)
CREATININE: 1.19 mg/dL (ref 0.61–1.24)
GFR calc Af Amer: >60 mL/min (ref >60)
GFR, EST NON AFRICAN AMERICAN: 58 mL/min — AB (ref >60)
Glucose, Bld: 116 mg/dL — ABNORMAL HIGH (ref 65–99)
Potassium: 3.5 mmol/L (ref 3.5–5.1)
Sodium: 138 mmol/L (ref 135–145)
Total Bilirubin: 1 mg/dL (ref 0.3–1.2)
Total Protein: 6.3 g/dL — ABNORMAL LOW (ref 6.5–8.1)

## 2016-02-12 LAB — CBC WITH DIFFERENTIAL/PLATELET
BASOS ABS: 0 10*3/uL (ref 0–0.1)
Basophils Relative: 1 %
Eosinophils Absolute: 0.1 10*3/uL (ref 0–0.7)
Eosinophils Relative: 3 %
HEMATOCRIT: 28.8 % — AB (ref 40.0–52.0)
HEMOGLOBIN: 10.2 g/dL — AB (ref 13.0–18.0)
LYMPHS ABS: 2.5 10*3/uL (ref 1.0–3.6)
LYMPHS PCT: 51 %
MCH: 39.8 pg — AB (ref 26.0–34.0)
MCHC: 35.2 g/dL (ref 32.0–36.0)
MCV: 113 fL — AB (ref 80.0–100.0)
Monocytes Absolute: 0.4 10*3/uL (ref 0.2–1.0)
Monocytes Relative: 10 %
NEUTROS ABS: 1.6 10*3/uL (ref 1.4–6.5)
NEUTROS PCT: 35 %
PLATELETS: 290 10*3/uL (ref 150–440)
RBC: 2.55 MIL/uL — AB (ref 4.40–5.90)
RDW: 17.3 % — ABNORMAL HIGH (ref 11.5–14.5)
WBC: 4.7 10*3/uL (ref 3.8–10.6)

## 2016-02-12 NOTE — Assessment & Plan Note (Signed)
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 He is also quite sedated from multiple different medications. I recommend a trial of reducing gabapentin. I recommend a trial of omitting the afternoon dose and just keep at 600 mg twice a day So far, he tolerated that well

## 2016-02-12 NOTE — Progress Notes (Signed)
Pt did get home PT and they taught him exercises and he is doing them and feels better

## 2016-02-12 NOTE — Assessment & Plan Note (Signed)
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

## 2016-02-12 NOTE — Assessment & Plan Note (Signed)
This is related to the LGL process He is not symptomatic from anemia I recommend folic acid supplement as he will be on chronic methotrexate therapy

## 2016-02-12 NOTE — Assessment & Plan Note (Signed)
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 He is doing well on home PT

## 2016-02-12 NOTE — Assessment & Plan Note (Signed)
I have a long discussion with the patient and family. He tolerated methotrexate well at 12.5 mg dose Absolute lymphocyte count is decreasing I recommend continue the same dose for now I plan to see him back in 1 month for further review

## 2016-02-12 NOTE — Assessment & Plan Note (Signed)
The patient has MGUS. Bone marrow biopsy revealed 5-10% plasma cells, insignificant I recommend close observation of his MGUS with blood work monitoring once a year, next due October 2018 

## 2016-02-12 NOTE — Progress Notes (Signed)
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 T-cell receptor rearrangement study is still pending      12/19/2015 -  Chemotherapy    The patient was started on 10 mg once a week methotrexate. The dose is increased to 12.5 mg starting 01/16/16        INTERVAL HISTORY: Please see below for problem oriented charting. He feels better He is tolerating home PT No recent falls No recent infection He tolerated reduced dose gabapentin at 600 mg BID  REVIEW OF SYSTEMS:   Constitutional: Denies fevers, chills or abnormal weight loss Eyes: Denies blurriness of vision Ears, nose, mouth, throat, and face: Denies mucositis or sore throat Respiratory: Denies cough, dyspnea or wheezes Cardiovascular: Denies palpitation, chest discomfort or lower extremity swelling Gastrointestinal:  Denies nausea, heartburn or change in bowel habits Skin: Denies abnormal skin rashes Lymphatics: Denies new lymphadenopathy or easy bruising Neurological:Denies numbness, tingling or new weaknesses Behavioral/Psych: Mood is stable, no new changes  All other systems were reviewed with the patient and are negative.  I have reviewed the past medical history, past surgical history, social history and family history with the patient and they are unchanged from previous note.  ALLERGIES:  is allergic to sulfa antibiotics and sulfonamide derivatives.  MEDICATIONS:  Current Outpatient Prescriptions  Medication Sig Dispense Refill  . acetaminophen (TYLENOL) 325 MG tablet Take 650 mg by mouth every 6 (six) hours as needed.    Marland Kitchen aspirin EC 81 MG tablet Take 162 mg by mouth daily.    Marland Kitchen CALCIUM CARB-CHOLECALCIFEROL PO Take 1,250 mg by mouth daily.    . Cyanocobalamin (VITAMIN B-12)  500 MCG SUBL Place 1 tablet under the tongue daily.     Marland Kitchen econazole nitrate 1 % cream Apply 1 application topically daily as needed.    . gabapentin (NEURONTIN) 300 MG capsule TAKE 2-3 CAPSULES (600-900 MG TOTAL) BY MOUTH 3 (THREE) TIMES DAILY AS NEEDED. 270 capsule 1  . ketoconazole (NIZORAL) 2 % shampoo Apply 1 application topically as needed.     . methotrexate 2.5 MG tablet Take 5 tablets (12.5 mg total) by mouth once a week. Caution: Chemotherapy. Protect from light. 30 tablet 11  . Multiple Vitamin (MULTIVITAMIN) tablet Take 1 tablet by mouth daily.      . ondansetron (ZOFRAN) 8 MG tablet Take 1 tablet (8 mg total) by mouth every 8 (eight) hours as needed for nausea. 30 tablet 3  . predniSONE (DELTASONE) 10 MG tablet Take 1 tablet (10 mg total) by mouth daily with breakfast. 90 tablet 3  . timolol (TIMOPTIC) 0.5 % ophthalmic solution Place 1 drop into both eyes at bedtime. One drop in each eye at bedtime.    . triamcinolone (KENALOG) 0.025 % cream Apply 1 application topically as needed.     No current facility-administered medications for this visit.     PHYSICAL EXAMINATION: ECOG PERFORMANCE STATUS: 1 - Symptomatic but completely ambulatory  Vitals:   02/12/16 1026  BP: 110/70  Pulse: 74  Resp: 18  Temp: (!) 96.2 F (35.7 C)   Filed Weights   02/12/16 1026  Weight: 225 lb 12 oz (102.4 kg)    GENERAL:alert, no distress and comfortable SKIN: skin color, texture, turgor are normal, no rashes or significant  lesions EYES: normal, Conjunctiva are pink and non-injected, sclera clear Musculoskeletal:no cyanosis of digits and no clubbing  NEURO: alert & oriented x 3 with fluent speech, no focal motor/sensory deficits  LABORATORY DATA:  I have reviewed the data as listed    Component Value Date/Time   NA 138 02/12/2016 0930   K 3.5 02/12/2016 0930   CL 107 02/12/2016 0930   CO2 24 02/12/2016 0930   GLUCOSE 116 (H) 02/12/2016 0930   BUN 18 02/12/2016 0930   CREATININE 1.19  02/12/2016 0930   CALCIUM 8.9 02/12/2016 0930   PROT 6.3 (L) 02/12/2016 0930   ALBUMIN 3.7 02/12/2016 0930   AST 53 (H) 02/12/2016 0930   ALT 60 02/12/2016 0930   ALKPHOS 70 02/12/2016 0930   BILITOT 1.0 02/12/2016 0930   GFRNONAA 58 (L) 02/12/2016 0930   GFRAA >60 02/12/2016 0930    No results found for: SPEP, UPEP  Lab Results  Component Value Date   WBC 4.7 02/12/2016   NEUTROABS 1.6 02/12/2016   HGB 10.2 (L) 02/12/2016   HCT 28.8 (L) 02/12/2016   MCV 113.0 (H) 02/12/2016   PLT 290 02/12/2016      Chemistry      Component Value Date/Time   NA 138 02/12/2016 0930   K 3.5 02/12/2016 0930   CL 107 02/12/2016 0930   CO2 24 02/12/2016 0930   BUN 18 02/12/2016 0930   CREATININE 1.19 02/12/2016 0930      Component Value Date/Time   CALCIUM 8.9 02/12/2016 0930   ALKPHOS 70 02/12/2016 0930   AST 53 (H) 02/12/2016 0930   ALT 60 02/12/2016 0930   BILITOT 1.0 02/12/2016 0930      ASSESSMENT & PLAN:  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 He is also quite sedated from multiple different medications. I recommend a trial of reducing gabapentin. I recommend a trial of omitting the afternoon dose and just keep at 600 mg twice a day So far, he tolerated that well   No orders of the defined types were placed in this encounter.  All questions were answered. The patient knows to call the clinic with any problems, questions or concerns. No barriers to learning was detected.  I spent 15 minutes counseling the patient face to face. The total time spent in the appointment was 20 minutes and more than 50% was on counseling and review of test results     Heath Lark, MD 02/12/2016 12:42 PM

## 2016-02-13 ENCOUNTER — Other Ambulatory Visit: Payer: Self-pay | Admitting: Hematology and Oncology

## 2016-02-13 DIAGNOSIS — C91Z0 Other lymphoid leukemia not having achieved remission: Secondary | ICD-10-CM

## 2016-03-12 ENCOUNTER — Encounter: Payer: Self-pay | Admitting: Hematology and Oncology

## 2016-03-12 ENCOUNTER — Ambulatory Visit (HOSPITAL_BASED_OUTPATIENT_CLINIC_OR_DEPARTMENT_OTHER): Payer: Medicare Other | Admitting: Hematology and Oncology

## 2016-03-12 ENCOUNTER — Other Ambulatory Visit (HOSPITAL_BASED_OUTPATIENT_CLINIC_OR_DEPARTMENT_OTHER): Payer: Medicare Other

## 2016-03-12 ENCOUNTER — Telehealth: Payer: Self-pay | Admitting: Hematology and Oncology

## 2016-03-12 DIAGNOSIS — R7989 Other specified abnormal findings of blood chemistry: Secondary | ICD-10-CM

## 2016-03-12 DIAGNOSIS — R110 Nausea: Secondary | ICD-10-CM

## 2016-03-12 DIAGNOSIS — C4491 Basal cell carcinoma of skin, unspecified: Secondary | ICD-10-CM

## 2016-03-12 DIAGNOSIS — C44311 Basal cell carcinoma of skin of nose: Secondary | ICD-10-CM

## 2016-03-12 DIAGNOSIS — G720 Drug-induced myopathy: Secondary | ICD-10-CM

## 2016-03-12 DIAGNOSIS — T380X5A Adverse effect of glucocorticoids and synthetic analogues, initial encounter: Secondary | ICD-10-CM

## 2016-03-12 DIAGNOSIS — G6181 Chronic inflammatory demyelinating polyneuritis: Secondary | ICD-10-CM

## 2016-03-12 DIAGNOSIS — C91Z0 Other lymphoid leukemia not having achieved remission: Secondary | ICD-10-CM

## 2016-03-12 DIAGNOSIS — B0229 Other postherpetic nervous system involvement: Secondary | ICD-10-CM

## 2016-03-12 DIAGNOSIS — R945 Abnormal results of liver function studies: Secondary | ICD-10-CM

## 2016-03-12 LAB — COMPREHENSIVE METABOLIC PANEL
ALBUMIN: 3.7 g/dL (ref 3.5–5.0)
ALK PHOS: 97 U/L (ref 40–150)
ALT: 65 U/L — ABNORMAL HIGH (ref 0–55)
AST: 49 U/L — AB (ref 5–34)
Anion Gap: 9 mEq/L (ref 3–11)
BILIRUBIN TOTAL: 1.15 mg/dL (ref 0.20–1.20)
BUN: 16.7 mg/dL (ref 7.0–26.0)
CALCIUM: 9 mg/dL (ref 8.4–10.4)
CO2: 23 mEq/L (ref 22–29)
Chloride: 107 mEq/L (ref 98–109)
Creatinine: 1 mg/dL (ref 0.7–1.3)
EGFR: 71 mL/min/{1.73_m2} — ABNORMAL LOW (ref >90)
Glucose: 118 mg/dl (ref 70–140)
POTASSIUM: 4.1 meq/L (ref 3.5–5.1)
Sodium: 140 mEq/L (ref 136–145)
TOTAL PROTEIN: 6.1 g/dL — AB (ref 6.4–8.3)

## 2016-03-12 LAB — CBC WITH DIFFERENTIAL/PLATELET
BASO%: 1.2 % (ref 0.0–2.0)
BASOS ABS: 0.1 10*3/uL (ref 0.0–0.1)
EOS%: 3.5 % (ref 0.0–7.0)
Eosinophils Absolute: 0.2 10*3/uL (ref 0.0–0.5)
HEMATOCRIT: 31.2 % — AB (ref 38.4–49.9)
HEMOGLOBIN: 10.6 g/dL — AB (ref 13.0–17.1)
LYMPH#: 1.8 10*3/uL (ref 0.9–3.3)
LYMPH%: 42.5 % (ref 14.0–49.0)
MCH: 39.4 pg — AB (ref 27.2–33.4)
MCHC: 34.2 g/dL (ref 32.0–36.0)
MCV: 115.5 fL — ABNORMAL HIGH (ref 79.3–98.0)
MONO#: 0.4 10*3/uL (ref 0.1–0.9)
MONO%: 9.4 % (ref 0.0–14.0)
NEUT#: 1.9 10*3/uL (ref 1.5–6.5)
NEUT%: 43.4 % (ref 39.0–75.0)
Platelets: 309 10*3/uL (ref 140–400)
RBC: 2.7 10*6/uL — ABNORMAL LOW (ref 4.20–5.82)
RDW: 18.1 % — AB (ref 11.0–14.6)
WBC: 4.3 10*3/uL (ref 4.0–10.3)

## 2016-03-12 NOTE — Assessment & Plan Note (Signed)
He had recent nausea. He has prescription anti-emetics to take as needed. I recommend he takes antiemetics before his car ride next time

## 2016-03-12 NOTE — Assessment & Plan Note (Signed)
I have a long discussion with the patient and family. He tolerated methotrexate well at 12.5 mg dose Absolute lymphocyte count is decreasing I recommend continue the same dose for now I plan to see him back in 2 months for further review

## 2016-03-12 NOTE — Assessment & Plan Note (Signed)
He has chronic elevated liver function test, not worse since methotrexate treatment. Continue close monitoring 

## 2016-03-12 NOTE — Progress Notes (Signed)
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 T-cell receptor rearrangement study is still pending      12/19/2015 -  Chemotherapy    The patient was started on 10 mg once a week methotrexate. The dose is increased to 12.5 mg starting 01/16/16        INTERVAL HISTORY: Please see below for problem oriented charting. He is doing very well. He is doing more for himself since his wife was debilitated by recent hip surgery. He continues physical therapy at home. He is less sedated with reduced gabapentin. He noticed he feels more fatigue on the days after he takes his methotrexate. He had recent nausea. He denies constipation. No new skin lesions. He denies recent infection. He did not notice a difference in his neuropathy when he reduces his dose of prednisone lately.  REVIEW OF SYSTEMS:   Constitutional: Denies fevers, chills or abnormal weight loss Eyes: Denies blurriness of vision Ears, nose, mouth, throat, and face: Denies mucositis or sore throat Respiratory: Denies cough, dyspnea or wheezes Cardiovascular: Denies palpitation, chest discomfort or lower extremity swelling Skin: Denies abnormal skin rashes Lymphatics: Denies new lymphadenopathy or easy bruising Behavioral/Psych: Mood is stable, no new changes  All other systems were reviewed with the patient and are negative.  I have reviewed the past medical history, past surgical history, social history and family history with the patient and they are unchanged from previous note.  ALLERGIES:  is allergic to sulfa antibiotics and sulfonamide derivatives.  MEDICATIONS:  Current Outpatient Prescriptions  Medication Sig Dispense Refill  . acetaminophen (TYLENOL) 325 MG tablet Take 650  mg by mouth every 6 (six) hours as needed.    Marland Kitchen aspirin EC 81 MG tablet Take 162 mg by mouth daily.    Marland Kitchen CALCIUM CARB-CHOLECALCIFEROL PO Take 1,250 mg by mouth daily.    . Cyanocobalamin (VITAMIN B-12) 500 MCG SUBL Place 1 tablet under the tongue daily.     Marland Kitchen econazole nitrate 1 % cream Apply 1 application topically daily as needed.    . gabapentin (NEURONTIN) 300 MG capsule TAKE 2-3 CAPSULES (600-900 MG TOTAL) BY MOUTH 3 (THREE) TIMES DAILY AS NEEDED. 270 capsule 1  . ketoconazole (NIZORAL) 2 % shampoo Apply 1 application topically as needed.     . methotrexate 2.5 MG tablet Take 5 tablets (12.5 mg total) by mouth once a week. Caution: Chemotherapy. Protect from light. 30 tablet 11  . Multiple Vitamin (MULTIVITAMIN) tablet Take 1 tablet by mouth daily.      . predniSONE (DELTASONE) 10 MG tablet Take 1 tablet (10 mg total) by mouth daily with breakfast. 90 tablet 3  . timolol (TIMOPTIC) 0.5 % ophthalmic solution Place 1 drop into both eyes at bedtime. One drop in each eye at bedtime.    . triamcinolone (KENALOG) 0.025 % cream Apply 1 application topically as needed.    . ondansetron (ZOFRAN) 8 MG tablet Take 1 tablet (8 mg total) by mouth every 8 (eight) hours as needed for nausea. (Patient not taking: Reported on 03/12/2016) 30 tablet 3   No current facility-administered medications for this visit.     PHYSICAL EXAMINATION: ECOG PERFORMANCE STATUS: 2 - Symptomatic, <50% confined to bed  Vitals:   03/12/16 1330  BP: 123/67  Pulse: 74  Resp: 18  Temp: 98.1 F (36.7 C)   Filed Weights   03/12/16 1330  Weight: 223 lb 6.4 oz (101.3 kg)    GENERAL:alert, no distress and comfortable. He is examined sitting on the wheelchair SKIN: skin color, texture, turgor are normal, no rashes or significant lesions. Well-healed surgical scar EYES: normal, Conjunctiva are pink and non-injected, sclera clear OROPHARYNX:no exudate, no erythema and lips, buccal mucosa, and tongue normal  NECK: supple,  thyroid normal size, non-tender, without nodularity LYMPH:  no palpable lymphadenopathy in the cervical, axillary or inguinal LUNGS: clear to auscultation and percussion with normal breathing effort HEART: regular rate & rhythm and no murmurs with moderate bilateral lower extremity edema ABDOMEN:abdomen soft, non-tender and normal bowel sounds Musculoskeletal:no cyanosis of digits and no clubbing  NEURO: alert & oriented x 3 with fluent speech, no focal motor/sensory deficits  LABORATORY DATA:  I have reviewed the data as listed    Component Value Date/Time   NA 140 03/12/2016 1319   K 4.1 03/12/2016 1319   CL 107 02/12/2016 0930   CO2 23 03/12/2016 1319   GLUCOSE 118 03/12/2016 1319   BUN 16.7 03/12/2016 1319   CREATININE 1.0 03/12/2016 1319   CALCIUM 9.0 03/12/2016 1319   PROT 6.1 (L) 03/12/2016 1319   ALBUMIN 3.7 03/12/2016 1319   AST 49 (H) 03/12/2016 1319   ALT 65 (H) 03/12/2016 1319   ALKPHOS 97 03/12/2016 1319   BILITOT 1.15 03/12/2016 1319   GFRNONAA 58 (L) 02/12/2016 0930   GFRAA >60 02/12/2016 0930    No results found for: SPEP, UPEP  Lab Results  Component Value Date   WBC 4.3 03/12/2016   NEUTROABS 1.9 03/12/2016   HGB 10.6 (L) 03/12/2016   HCT 31.2 (L) 03/12/2016   MCV 115.5 (H) 03/12/2016   PLT 309 03/12/2016      Chemistry      Component Value Date/Time   NA 140 03/12/2016 1319   K 4.1 03/12/2016 1319   CL 107 02/12/2016 0930   CO2 23 03/12/2016 1319   BUN 16.7 03/12/2016 1319   CREATININE 1.0 03/12/2016 1319      Component Value Date/Time   CALCIUM 9.0 03/12/2016 1319   ALKPHOS 97 03/12/2016 1319   AST 49 (H) 03/12/2016 1319   ALT 65 (H) 03/12/2016 1319   BILITOT 1.15 03/12/2016 1319      ASSESSMENT & PLAN:  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 He is also quite sedated from multiple different medications. I recommend a trial of reducing gabapentin. 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 He is doing well on home PT I continue to encourage him to increase exercise activity as tolerated  Nausea without vomiting He had recent nausea. He has prescription anti-emetics to take as needed. I recommend he takes antiemetics before his car ride next time  Basal cell carcinoma of skin He has surgical resection on 01/09/2016. His wound appears to be healing well  LFT elevation He has chronic elevated liver function test, not worse since methotrexate treatment. Continue close monitoring   No orders of the defined types were placed in this encounter.  All questions were answered. The patient knows to call the clinic with any problems, questions or concerns. No barriers to learning was detected. I spent 25 minutes counseling the patient face to face. The total time spent in the  appointment was 30 minutes and more than 50% was on counseling and review of test results     Heath Lark, MD 03/12/2016 6:21 PM

## 2016-03-12 NOTE — Assessment & Plan Note (Signed)
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 He is doing well on home PT I continue to encourage him to increase exercise activity as tolerated

## 2016-03-12 NOTE — Assessment & Plan Note (Signed)
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

## 2016-03-12 NOTE — Assessment & Plan Note (Signed)
He has surgical resection on 01/09/2016. His wound appears to be healing well

## 2016-03-12 NOTE — Telephone Encounter (Signed)
Pt confirmed and received avs °

## 2016-03-12 NOTE — Assessment & Plan Note (Signed)
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 He is also quite sedated from multiple different medications. I recommend a trial of reducing gabapentin. I recommend a trial of omitting the afternoon dose and just keep at 600 mg twice a day So far, he tolerated that well

## 2016-05-05 ENCOUNTER — Encounter: Payer: Self-pay | Admitting: Hematology and Oncology

## 2016-05-05 ENCOUNTER — Ambulatory Visit (HOSPITAL_BASED_OUTPATIENT_CLINIC_OR_DEPARTMENT_OTHER): Payer: Medicare Other | Admitting: Hematology and Oncology

## 2016-05-05 ENCOUNTER — Ambulatory Visit (HOSPITAL_BASED_OUTPATIENT_CLINIC_OR_DEPARTMENT_OTHER): Payer: Medicare Other

## 2016-05-05 ENCOUNTER — Telehealth: Payer: Self-pay

## 2016-05-05 ENCOUNTER — Ambulatory Visit (HOSPITAL_COMMUNITY)
Admission: RE | Admit: 2016-05-05 | Discharge: 2016-05-05 | Disposition: A | Discharge status: Home / Self Care | Payer: Medicare Other | Source: Ambulatory Visit | Attending: Hematology and Oncology | Admitting: Hematology and Oncology

## 2016-05-05 VITALS — BP 128/68 | HR 72 | Temp 98.0°F | Resp 17 | Ht 76.97 in | Wt 228.3 lb

## 2016-05-05 DIAGNOSIS — R0789 Other chest pain: Secondary | ICD-10-CM | POA: Insufficient documentation

## 2016-05-05 DIAGNOSIS — B0229 Other postherpetic nervous system involvement: Secondary | ICD-10-CM

## 2016-05-05 DIAGNOSIS — C91Z0 Other lymphoid leukemia not having achieved remission: Secondary | ICD-10-CM

## 2016-05-05 DIAGNOSIS — G720 Drug-induced myopathy: Secondary | ICD-10-CM | POA: Diagnosis not present

## 2016-05-05 DIAGNOSIS — D472 Monoclonal gammopathy: Secondary | ICD-10-CM

## 2016-05-05 DIAGNOSIS — T380X5A Adverse effect of glucocorticoids and synthetic analogues, initial encounter: Secondary | ICD-10-CM

## 2016-05-05 DIAGNOSIS — G6181 Chronic inflammatory demyelinating polyneuritis: Secondary | ICD-10-CM

## 2016-05-05 DIAGNOSIS — D539 Nutritional anemia, unspecified: Secondary | ICD-10-CM | POA: Diagnosis not present

## 2016-05-05 LAB — CBC WITH DIFFERENTIAL/PLATELET
BASO%: 0.4 % (ref 0.0–2.0)
Basophils Absolute: 0 10*3/uL (ref 0.0–0.1)
EOS ABS: 0.1 10*3/uL (ref 0.0–0.5)
EOS%: 1.3 % (ref 0.0–7.0)
HCT: 33 % — ABNORMAL LOW (ref 38.4–49.9)
HGB: 11 g/dL — ABNORMAL LOW (ref 13.0–17.1)
LYMPH#: 2 10*3/uL (ref 0.9–3.3)
LYMPH%: 37.3 % (ref 14.0–49.0)
MCH: 38.1 pg — ABNORMAL HIGH (ref 27.2–33.4)
MCHC: 33.3 g/dL (ref 32.0–36.0)
MCV: 114.2 fL — AB (ref 79.3–98.0)
MONO#: 0.3 10*3/uL (ref 0.1–0.9)
MONO%: 5.3 % (ref 0.0–14.0)
NEUT#: 3 10*3/uL (ref 1.5–6.5)
NEUT%: 55.7 % (ref 39.0–75.0)
Platelets: 274 10*3/uL (ref 140–400)
RBC: 2.89 10*6/uL — AB (ref 4.20–5.82)
RDW: 16.2 % — AB (ref 11.0–14.6)
WBC: 5.4 10*3/uL (ref 4.0–10.3)

## 2016-05-05 LAB — COMPREHENSIVE METABOLIC PANEL
ALT: 48 U/L (ref 0–55)
ANION GAP: 8 meq/L (ref 3–11)
AST: 39 U/L — ABNORMAL HIGH (ref 5–34)
Albumin: 3.6 g/dL (ref 3.5–5.0)
Alkaline Phosphatase: 110 U/L (ref 40–150)
BILIRUBIN TOTAL: 0.83 mg/dL (ref 0.20–1.20)
BUN: 14.7 mg/dL (ref 7.0–26.0)
CHLORIDE: 108 meq/L (ref 98–109)
CO2: 23 meq/L (ref 22–29)
Calcium: 9 mg/dL (ref 8.4–10.4)
Creatinine: 1.1 mg/dL (ref 0.7–1.3)
EGFR: 67 mL/min/{1.73_m2} — AB (ref >90)
Glucose: 141 mg/dl — ABNORMAL HIGH (ref 70–140)
POTASSIUM: 4.1 meq/L (ref 3.5–5.1)
SODIUM: 140 meq/L (ref 136–145)
Total Protein: 6.5 g/dL (ref 6.4–8.3)

## 2016-05-05 NOTE — Assessment & Plan Note (Signed)
He tolerated methotrexate well at 12.5 mg dose Except for his macrocytic anemia, which very well could be related to methotrexate, lymphocytosis had resolved I recommend continue the same dose for now I plan to see him back next month for further review

## 2016-05-05 NOTE — Telephone Encounter (Signed)
Can he come in today? If so, tell him to stop by [facility] X ray department first for CXR, then labs and see me at 12 pm, 30 mins

## 2016-05-05 NOTE — Telephone Encounter (Addendum)
s/w wife re to come in today for CXR, lab, Dr Alvy Bimler. They are from [facility] so will take about an hour to get here. s/w Dr Alvy Bimler and she will work them in.  Order for CXR placed. inbasket sent for lab add on/MD

## 2016-05-05 NOTE — Telephone Encounter (Signed)
Requested a call back for "something over the weekend" that did not go away. Sunday morning feeling bad all over, slept all day, was sluggish and had a headache. This AM he asked wife to call MD he feels a burning in his chest and feels like something is hung in there. He describes it as between his heart and his rib cage.  She can hear congestion when he coughs. No fever. Is not bringing anything up.  Takes his methotrexate on Thursdays. Next visit 4/12.

## 2016-05-05 NOTE — Assessment & Plan Note (Signed)
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

## 2016-05-05 NOTE — Assessment & Plan Note (Signed)
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

## 2016-05-05 NOTE — Progress Notes (Signed)
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 T-cell receptor rearrangement study is still pending      12/19/2015 -  Chemotherapy    The patient was started on 10 mg once a week methotrexate. The dose is increased to 12.5 mg starting 01/16/16        INTERVAL HISTORY: Please see below for problem oriented charting. He is seen urgently today because of multiple complaints Last week, he was mowing the yard and had strenuous activity for 3 days He had profound fatigue and slept most of the day on Sunday. He had lower abdominal cramping with diarrhea on Saturday night and then atypical retrosternal chest wall discomfort starting Sunday He described the chest pain as burning in nature and somewhat pleuritic, affected by his breathing He has not taken any over-the-counter analgesics He had some mild headache, resolved He has occasional dry cough His appetite is stable Denies recent infection  REVIEW OF SYSTEMS:   Constitutional: Denies fevers, chills or abnormal weight loss Eyes: Denies blurriness of vision Ears, nose, mouth, throat, and face: Denies mucositis or sore throat Skin: Denies abnormal skin rashes Lymphatics: Denies new lymphadenopathy or easy bruising Behavioral/Psych: Mood is stable, no new changes  All other systems were reviewed with the patient and are negative.  I have reviewed the past medical history, past surgical history, social history and family history with the patient and they are unchanged from previous note.  ALLERGIES:  is allergic to sulfa antibiotics and sulfonamide derivatives.  MEDICATIONS:  Current Outpatient Prescriptions  Medication Sig Dispense Refill  . acetaminophen (TYLENOL) 325 MG tablet Take 650 mg  by mouth every 6 (six) hours as needed.    Marland Kitchen aspirin EC 81 MG tablet Take 162 mg by mouth daily.    Marland Kitchen CALCIUM CARB-CHOLECALCIFEROL PO Take 1,250 mg by mouth daily.    . Cyanocobalamin (VITAMIN B-12) 500 MCG SUBL Place 1 tablet under the tongue daily.     Marland Kitchen econazole nitrate 1 % cream Apply 1 application topically daily as needed.    . folic acid (FOLVITE) 253 MCG tablet Take 400 mcg by mouth daily.    Marland Kitchen gabapentin (NEURONTIN) 300 MG capsule TAKE 2-3 CAPSULES (600-900 MG TOTAL) BY MOUTH 3 (THREE) TIMES DAILY AS NEEDED. 270 capsule 1  . ketoconazole (NIZORAL) 2 % shampoo Apply 1 application topically as needed.     . methotrexate 2.5 MG tablet Take 5 tablets (12.5 mg total) by mouth once a week. Caution: Chemotherapy. Protect from light. 30 tablet 11  . Multiple Vitamin (MULTIVITAMIN) tablet Take 1 tablet by mouth daily.      . predniSONE (DELTASONE) 10 MG tablet Take 1 tablet (10 mg total) by mouth daily with breakfast. 90 tablet 3  . timolol (TIMOPTIC) 0.5 % ophthalmic solution Place 1 drop into both eyes at bedtime. One drop in each eye at bedtime.    . triamcinolone (KENALOG) 0.025 % cream Apply 1 application topically as needed.    . ondansetron (ZOFRAN) 8 MG tablet Take 1 tablet (8 mg total) by mouth every 8 (eight) hours as needed for nausea. (Patient not taking: Reported on 03/12/2016) 30 tablet 3   No current facility-administered medications for this visit.     PHYSICAL EXAMINATION: ECOG PERFORMANCE STATUS:  1 - Symptomatic but completely ambulatory  Vitals:   05/05/16 1209  BP: 128/68  Pulse: 72  Resp: 17  Temp: 98 F (36.7 C)   Filed Weights   05/05/16 1209  Weight: 228 lb 4.8 oz (103.6 kg)    GENERAL:alert, no distress and comfortable SKIN: skin color, texture, turgor are normal, no rashes or significant lesions EYES: normal, Conjunctiva are pink and non-injected, sclera clear OROPHARYNX:no exudate, no erythema and lips, buccal mucosa, and tongue normal  NECK: supple,  thyroid normal size, non-tender, without nodularity LYMPH:  no palpable lymphadenopathy in the cervical, axillary or inguinal LUNGS: clear to auscultation and percussion with normal breathing effort HEART: regular rate & rhythm and no murmurs and no lower extremity edema.  Chest wall pain is reproducible on exam, on palpation of his sternum ABDOMEN:abdomen soft, non-tender and normal bowel sounds Musculoskeletal:no cyanosis of digits and no clubbing  NEURO: alert & oriented x 3 with fluent speech, no focal motor/sensory deficits  LABORATORY DATA:  I have reviewed the data as listed    Component Value Date/Time   NA 140 05/05/2016 1244   K 4.1 05/05/2016 1244   CL 107 02/12/2016 0930   CO2 23 05/05/2016 1244   GLUCOSE 141 (H) 05/05/2016 1244   BUN 14.7 05/05/2016 1244   CREATININE 1.1 05/05/2016 1244   CALCIUM 9.0 05/05/2016 1244   PROT 6.5 05/05/2016 1244   ALBUMIN 3.6 05/05/2016 1244   AST 39 (H) 05/05/2016 1244   ALT 48 05/05/2016 1244   ALKPHOS 110 05/05/2016 1244   BILITOT 0.83 05/05/2016 1244   GFRNONAA 58 (L) 02/12/2016 0930   GFRAA >60 02/12/2016 0930    No results found for: SPEP, UPEP  Lab Results  Component Value Date   WBC 5.4 05/05/2016   NEUTROABS 3.0 05/05/2016   HGB 11.0 (L) 05/05/2016   HCT 33.0 (L) 05/05/2016   MCV 114.2 (H) 05/05/2016   PLT 274 05/05/2016      Chemistry      Component Value Date/Time   NA 140 05/05/2016 1244   K 4.1 05/05/2016 1244   CL 107 02/12/2016 0930   CO2 23 05/05/2016 1244   BUN 14.7 05/05/2016 1244   CREATININE 1.1 05/05/2016 1244      Component Value Date/Time   CALCIUM 9.0 05/05/2016 1244   ALKPHOS 110 05/05/2016 1244   AST 39 (H) 05/05/2016 1244   ALT 48 05/05/2016 1244   BILITOT 0.83 05/05/2016 1244       RADIOGRAPHIC STUDIES: I have personally reviewed the radiological images as listed and agreed with the findings in the report. Dg Chest 2 View  Result Date: 05/05/2016 CLINICAL DATA:  Cough and  congestion for 2 days.  Leukemia EXAM: CHEST  2 VIEW COMPARISON:  12/03/2015 FINDINGS: The heart size and mediastinal contours are within normal limits. Both lungs are clear. The visualized skeletal structures are unremarkable. IMPRESSION: No active cardiopulmonary disease. Electronically Signed   By: Kerby Moors M.D.   On: 05/05/2016 11:56    ASSESSMENT & PLAN:  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[facility]) He tolerated methotrexate well at 12.5 mg dose Except for his macrocytic anemia, which very well could be related to methotrexate, lymphocytosis had resolved I recommend continue the same dose for now I plan to see him back next month for further review  CIDP (chronic inflammatory demyelinating polyneuropathy) ([place]) The patient has worsening neuropathy despite being on prednisone therapy T-cell LGL can be associated with autoimmune disorder He tolerated reduced dose prednisone  well Currently, his prednisone dose is at 5 mg on Mondays and Thursdays and to continue 10 mg for the rest of the week Starting May 2018, I recommend he takes prednisone 5 mg on Mondays, Wednesdays and Fridays and to take 10 mg for the rest of the week I will try slow weaning prednisone in the future He has no recent flare of CIDP recently  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 He is also quite sedated from multiple different medications. I recommend a trial of reducing gabapentin. He is currently on 600 mg twice a day So far, he tolerated that well  Macrocytic anemia This is related to the LGL process He is not symptomatic from anemia I recommend folic acid supplement and vitamin B12 as he will be on chronic methotrexate  therapy  Atypical chest pain He has mild atypical chest pain likely due to recent strain his activity The pain is reproducible on chest wall palpation I recommend over-the-counter analgesics as tolerated Chest x-ray is negative and blood work show no evidence to suggest infection There is also possible component of GERD that causes burning sensation   Orders Placed This Encounter  Procedures  . Kappa/lambda light chains    Standing Status:   Future    Standing Expiration Date:   06/09/2017  . Multiple Myeloma Panel (SPEP&IFE w/QIG)    Standing Status:   Future    Standing Expiration Date:   06/09/2017   All questions were answered. The patient knows to call the clinic with any problems, questions or concerns. No barriers to learning was detected. I spent 25 minutes counseling the patient face to face. The total time spent in the appointment was 30 minutes and more than 50% was on counseling and review of test results     Heath Lark, MD 05/05/2016 1:51 PM

## 2016-05-05 NOTE — Assessment & Plan Note (Signed)
This is related to the LGL process He is not symptomatic from anemia I recommend folic acid supplement and vitamin B12 as he will be on chronic methotrexate therapy

## 2016-05-05 NOTE — Assessment & Plan Note (Signed)
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 He is also quite sedated from multiple different medications. I recommend a trial of reducing gabapentin. He is currently on 600 mg twice a day So far, he tolerated that well 

## 2016-05-05 NOTE — Assessment & Plan Note (Signed)
He has mild atypical chest pain likely due to recent strain his activity The pain is reproducible on chest wall palpation I recommend over-the-counter analgesics as tolerated Chest x-ray is negative and blood work show no evidence to suggest infection There is also possible component of GERD that causes burning sensation

## 2016-05-06 ENCOUNTER — Telehealth: Payer: Self-pay | Admitting: Hematology and Oncology

## 2016-05-06 NOTE — Telephone Encounter (Signed)
Called patient to inform him of next scheduled appointments. °

## 2016-05-11 ENCOUNTER — Other Ambulatory Visit: Payer: Self-pay | Admitting: Family Medicine

## 2016-05-11 NOTE — Telephone Encounter (Signed)
Received refill request electronically Last refill 01/09/16 #270/1 Last office visit 11/15/15

## 2016-05-12 NOTE — Telephone Encounter (Signed)
Spoke to patient and was advised that he has tapered down to two twice a day and thinks that is as much as he can cut back to. Patient stated that he made this change 01/15/16. Patient stated that he does not need a refill at this time because he has two bottles on hand. Patient stated that they are lasting longer since he is taking less and thinks pharmacy has this on automatic refill.

## 2016-05-12 NOTE — Telephone Encounter (Signed)
Please verify current dose with patient.  I thought he had been tapering dose.  Thanks.

## 2016-05-13 MED ORDER — GABAPENTIN 300 MG PO CAPS: 600.0000 mg | ORAL_CAPSULE | Freq: Two times a day (BID) | ORAL | Status: DC

## 2016-05-13 NOTE — Telephone Encounter (Signed)
Noted. Thanks.  I denied the refill.

## 2016-05-14 ENCOUNTER — Ambulatory Visit: Payer: Medicare Other | Admitting: Hematology and Oncology

## 2016-05-14 ENCOUNTER — Other Ambulatory Visit: Payer: Medicare Other

## 2016-06-23 ENCOUNTER — Other Ambulatory Visit (HOSPITAL_BASED_OUTPATIENT_CLINIC_OR_DEPARTMENT_OTHER): Payer: Medicare Other

## 2016-06-23 ENCOUNTER — Telehealth: Payer: Self-pay | Admitting: Hematology and Oncology

## 2016-06-23 ENCOUNTER — Encounter: Payer: Self-pay | Admitting: Hematology and Oncology

## 2016-06-23 ENCOUNTER — Ambulatory Visit (HOSPITAL_BASED_OUTPATIENT_CLINIC_OR_DEPARTMENT_OTHER): Payer: Medicare Other | Admitting: Hematology and Oncology

## 2016-06-23 DIAGNOSIS — R945 Abnormal results of liver function studies: Secondary | ICD-10-CM

## 2016-06-23 DIAGNOSIS — C91Z0 Other lymphoid leukemia not having achieved remission: Secondary | ICD-10-CM

## 2016-06-23 DIAGNOSIS — G6181 Chronic inflammatory demyelinating polyneuritis: Secondary | ICD-10-CM

## 2016-06-23 DIAGNOSIS — D472 Monoclonal gammopathy: Secondary | ICD-10-CM

## 2016-06-23 DIAGNOSIS — G720 Drug-induced myopathy: Secondary | ICD-10-CM | POA: Diagnosis not present

## 2016-06-23 DIAGNOSIS — T380X5A Adverse effect of glucocorticoids and synthetic analogues, initial encounter: Secondary | ICD-10-CM

## 2016-06-23 DIAGNOSIS — R7989 Other specified abnormal findings of blood chemistry: Secondary | ICD-10-CM

## 2016-06-23 DIAGNOSIS — D539 Nutritional anemia, unspecified: Secondary | ICD-10-CM

## 2016-06-23 DIAGNOSIS — B0229 Other postherpetic nervous system involvement: Secondary | ICD-10-CM | POA: Diagnosis not present

## 2016-06-23 LAB — COMPREHENSIVE METABOLIC PANEL WITH GFR
ALT: 68 U/L — ABNORMAL HIGH (ref 0–55)
AST: 53 U/L — ABNORMAL HIGH (ref 5–34)
Albumin: 3.7 g/dL (ref 3.5–5.0)
Alkaline Phosphatase: 94 U/L (ref 40–150)
Anion Gap: 9 meq/L (ref 3–11)
BUN: 14.3 mg/dL (ref 7.0–26.0)
CO2: 24 meq/L (ref 22–29)
Calcium: 9.2 mg/dL (ref 8.4–10.4)
Chloride: 107 meq/L (ref 98–109)
Creatinine: 1.1 mg/dL (ref 0.7–1.3)
EGFR: 65 ml/min/1.73 m2 — ABNORMAL LOW (ref >90)
Glucose: 120 mg/dL (ref 70–140)
Potassium: 4.1 meq/L (ref 3.5–5.1)
Sodium: 140 meq/L (ref 136–145)
Total Bilirubin: 1.12 mg/dL (ref 0.20–1.20)
Total Protein: 6.3 g/dL — ABNORMAL LOW (ref 6.4–8.3)

## 2016-06-23 LAB — CBC WITH DIFFERENTIAL/PLATELET
BASO%: 0.3 % (ref 0.0–2.0)
Basophils Absolute: 0 10e3/uL (ref 0.0–0.1)
EOS%: 2.6 % (ref 0.0–7.0)
Eosinophils Absolute: 0.2 10e3/uL (ref 0.0–0.5)
HCT: 33.2 % — ABNORMAL LOW (ref 38.4–49.9)
HGB: 11.1 g/dL — ABNORMAL LOW (ref 13.0–17.1)
LYMPH%: 46.8 % (ref 14.0–49.0)
MCH: 37.9 pg — ABNORMAL HIGH (ref 27.2–33.4)
MCHC: 33.4 g/dL (ref 32.0–36.0)
MCV: 113.3 fL — ABNORMAL HIGH (ref 79.3–98.0)
MONO#: 0.5 10e3/uL (ref 0.1–0.9)
MONO%: 7.8 % (ref 0.0–14.0)
NEUT#: 2.4 10e3/uL (ref 1.5–6.5)
NEUT%: 42.5 % (ref 39.0–75.0)
Platelets: 280 10e3/uL (ref 140–400)
RBC: 2.93 10e6/uL — ABNORMAL LOW (ref 4.20–5.82)
RDW: 16.5 % — ABNORMAL HIGH (ref 11.0–14.6)
WBC: 5.8 10e3/uL (ref 4.0–10.3)
lymph#: 2.7 10e3/uL (ref 0.9–3.3)

## 2016-06-23 MED ORDER — PREDNISONE 5 MG PO TABS: ORAL_TABLET | ORAL | 1 refills | Status: DC

## 2016-06-23 NOTE — Assessment & Plan Note (Signed)
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 He is also quite sedated from multiple different medications. I recommend a trial of reducing gabapentin. He is currently on 600 mg twice a day So far, he tolerated that well 

## 2016-06-23 NOTE — Assessment & Plan Note (Signed)
He tolerated methotrexate well at 12.5 mg dose Except for his macrocytic anemia, which very well could be related to methotrexate, lymphocytosis had resolved I recommend continue the same dose for now I plan to see him back in 3 months for further review 

## 2016-06-23 NOTE — Assessment & Plan Note (Addendum)
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

## 2016-06-23 NOTE — Assessment & Plan Note (Signed)
This is related to the LGL process He is not symptomatic from anemia I recommend folic acid supplement and vitamin B12 as he will be on chronic methotrexate therapy

## 2016-06-23 NOTE — Progress Notes (Signed)
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 T-cell receptor rearrangement study is still pending      12/19/2015 -  Chemotherapy    The patient was started on 10 mg once a week methotrexate. The dose is increased to 12.5 mg starting 01/16/16        INTERVAL HISTORY: Please see below for problem oriented charting. He returns with his wife and his daughter. Overall, he is doing well. He tolerated recent prednisone taper well. He is active at home. No recent falls. He has intermittent fatigue after physical activity. He continues to have problem with steroid myopathy with difficulties getting out of chair He denies recent infection He tolerated reduced dose gabapentin well He continues to have postherpetic neuralgia but this is stable. He denies worsening CIDP  REVIEW OF SYSTEMS:   Constitutional: Denies fevers, chills or abnormal weight loss Eyes: Denies blurriness of vision Ears, nose, mouth, throat, and face: Denies mucositis or sore throat Respiratory: Denies cough, dyspnea or wheezes Cardiovascular: Denies palpitation, chest discomfort or lower extremity swelling Gastrointestinal:  Denies nausea, heartburn or change in bowel habits Skin: Denies abnormal skin rashes Lymphatics: Denies new lymphadenopathy or easy bruising Neurological:Denies numbness, tingling or new weaknesses Behavioral/Psych: Mood is stable, no new changes  All other systems were reviewed with the patient and are negative.  I have reviewed the past medical history, past surgical history, social history and family history with the patient and they are unchanged from previous note.  ALLERGIES:  is allergic to sulfa antibiotics and sulfonamide  derivatives.  MEDICATIONS:  Current Outpatient Prescriptions  Medication Sig Dispense Refill  . acetaminophen (TYLENOL) 325 MG tablet Take 650 mg by mouth every 6 (six) hours as needed.    Marland Kitchen aspirin EC 81 MG tablet Take 162 mg by mouth daily.    Marland Kitchen CALCIUM CARB-CHOLECALCIFEROL PO Take 1,250 mg by mouth daily.    . Cyanocobalamin (VITAMIN B-12) 500 MCG SUBL Place 1 tablet under the tongue daily.     Marland Kitchen econazole nitrate 1 % cream Apply 1 application topically daily as needed.    . folic acid (FOLVITE) 409 MCG tablet Take 400 mcg by mouth daily.    Marland Kitchen gabapentin (NEURONTIN) 300 MG capsule Take 2 capsules (600 mg total) by mouth 2 (two) times daily.    Marland Kitchen ketoconazole (NIZORAL) 2 % shampoo Apply 1 application topically as needed.     . methotrexate 2.5 MG tablet Take 5 tablets (12.5 mg total) by mouth once a week. Caution: Chemotherapy. Protect from light. 30 tablet 11  . Multiple Vitamin (MULTIVITAMIN) tablet Take 1 tablet by mouth daily.      . ondansetron (ZOFRAN) 8 MG tablet Take 1 tablet (8 mg total) by mouth every 8 (eight) hours as needed for nausea. (Patient not taking: Reported on 03/12/2016) 30 tablet 3  . predniSONE (DELTASONE) 5 MG tablet Take 5 mg by mouth on Mondays, Wednesdays and Fridays and take 10 mg daily for other days of the week 90 tablet 1  . timolol (TIMOPTIC) 0.5 % ophthalmic solution Place 1 drop into both eyes at bedtime. One drop in each eye at bedtime.    . triamcinolone (KENALOG) 0.025 % cream Apply 1 application topically as needed.     No  current facility-administered medications for this visit.     PHYSICAL EXAMINATION: ECOG PERFORMANCE STATUS: 2 - Symptomatic, <50% confined to bed  Vitals:   06/23/16 1339  BP: 125/71  Pulse: 72  Resp: 18  Temp: 98.3 F (36.8 C)   Filed Weights   06/23/16 1339  Weight: 237 lb 12.8 oz (107.9 kg)    GENERAL:alert, no distress and comfortable SKIN: skin color, texture, turgor are normal, no rashes or significant lesions.  Noted skin bruises EYES: normal, Conjunctiva are pink and non-injected, sclera clear OROPHARYNX:no exudate, no erythema and lips, buccal mucosa, and tongue normal  NECK: supple, thyroid normal size, non-tender, without nodularity LYMPH:  no palpable lymphadenopathy in the cervical, axillary or inguinal LUNGS: clear to auscultation and percussion with normal breathing effort HEART: regular rate & rhythm and no murmurs and no lower extremity edema ABDOMEN:abdomen soft, non-tender and normal bowel sounds Musculoskeletal:no cyanosis of digits and no clubbing. Noted steroid myopathy NEURO: alert & oriented x 3 with fluent speech, no focal motor/sensory deficits  LABORATORY DATA:  I have reviewed the data as listed    Component Value Date/Time   NA 140 06/23/2016 1323   K 4.1 06/23/2016 1323   CL 107 02/12/2016 0930   CO2 24 06/23/2016 1323   GLUCOSE 120 06/23/2016 1323   BUN 14.3 06/23/2016 1323   CREATININE 1.1 06/23/2016 1323   CALCIUM 9.2 06/23/2016 1323   PROT 6.3 (L) 06/23/2016 1323   ALBUMIN 3.7 06/23/2016 1323   AST 53 (H) 06/23/2016 1323   ALT 68 (H) 06/23/2016 1323   ALKPHOS 94 06/23/2016 1323   BILITOT 1.12 06/23/2016 1323   GFRNONAA 58 (L) 02/12/2016 0930   GFRAA >60 02/12/2016 0930    No results found for: SPEP, UPEP  Lab Results  Component Value Date   WBC 5.8 06/23/2016   NEUTROABS 2.4 06/23/2016   HGB 11.1 (L) 06/23/2016   HCT 33.2 (L) 06/23/2016   MCV 113.3 (H) 06/23/2016   PLT 280 06/23/2016      Chemistry      Component Value Date/Time   NA 140 06/23/2016 1323   K 4.1 06/23/2016 1323   CL 107 02/12/2016 0930   CO2 24 06/23/2016 1323   BUN 14.3 06/23/2016 1323   CREATININE 1.1 06/23/2016 1323      Component Value Date/Time   CALCIUM 9.2 06/23/2016 1323   ALKPHOS 94 06/23/2016 1323   AST 53 (H) 06/23/2016 1323   ALT 68 (H) 06/23/2016 1323   BILITOT 1.12 06/23/2016 1323      ASSESSMENT & PLAN:  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[place]) He  tolerated methotrexate well at 12.5 mg dose Except for his macrocytic anemia, which very well could be related to methotrexate, lymphocytosis had resolved I recommend continue the same dose for now I plan to see him back in 3 months for further review  CIDP (chronic inflammatory demyelinating polyneuropathy) ([place]) The patient had worsening neuropathy in 2017 despite being on prednisone therapy 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 He is also quite sedated from multiple different medications. I recommend a trial of reducing gabapentin. He is currently on 600 mg twice a day So far, he tolerated that well  LFT elevation He has chronic elevated liver function test, not worse since methotrexate treatment. 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 Bone marrow biopsy revealed 5-10% plasma cells, insignificant I recommend close observation of his MGUS with blood work monitoring once a year, next due October 2018   No orders of the defined types were placed in this encounter.  All  questions were answered. The patient knows to call the clinic with any problems, questions or concerns. No barriers to learning was detected. I spent 20 minutes counseling the patient face to face. The total time spent in the appointment was 30 minutes and more than 50% was on counseling and review of test results     Heath Lark, MD 06/23/2016 4:27 PM

## 2016-06-23 NOTE — Assessment & Plan Note (Signed)
The patient has MGUS. Bone marrow biopsy revealed 5-10% plasma cells, insignificant I recommend close observation of his MGUS with blood work monitoring once a year, next due October 2018

## 2016-06-23 NOTE — Assessment & Plan Note (Signed)
He has chronic elevated liver function test, not worse since methotrexate treatment. Continue close monitoring

## 2016-06-23 NOTE — Telephone Encounter (Signed)
Appointments scheduled per 06/23/16 los. Patient was given a copy of the AVS report and appointment schedule per 06/23/16 los.

## 2016-06-23 NOTE — Assessment & Plan Note (Signed)
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 I plan to reduce prednisone further in 3 months I will try slow weaning prednisone in the future He has no recent flare of CIDP recently

## 2016-07-07 ENCOUNTER — Telehealth: Payer: Self-pay | Admitting: Family Medicine

## 2016-07-07 NOTE — Telephone Encounter (Signed)
PLEASE NOTE: All timestamps contained within this report are represented as [place] Standard Time. CONFIDENTIALTY NOTICE: This fax transmission is intended only for the addressee. It contains information that is legally privileged, confidential or otherwise protected from use or disclosure. If you are not the intended recipient, you are strictly prohibited from reviewing, disclosing, copying using or disseminating any of this information or taking any action in reliance on or regarding this information. If you have received this fax in error, please notify us immediately by telephone so that we can arrange for its return to [place]. Phone: [phone], Toll-Free: [phone], Fax: [phone] Page: 1 of 2 Call Id: [id number] [place] Patient Name: [name] Gender: Male DOB: 28-Mar-1940 Age: 76 Y 2 M 17 D Return Phone Number: [phone] (Primary), [phone] (Secondary) City/State/Zip: [place] [address] Client [facility] - Client Client Site [facility] Physician Renford Dills - MD Who Is Calling Patient / Member / Family / Caregiver Call Type Triage / Clinical Caller Name Kaz Auld Relationship To Patient Spouse Return Phone Number [phone] (Primary) Chief Complaint Puncture Wound / Stab Wound Reason for Call Symptomatic / Request for [place] states husband cut toe nails down too low, cut skin, had sepsis before, and wondering what to do for him. Bleeds easily, and cancer PT. Appointment Disposition EMR Appointment Not Necessary Info pasted into Epic Yes Nurse Assessment Nurse: Julien Girt, RN, [facility] Date/Time Eilene Ghazi Time): 07/07/2016 9:52:07 AM Confirm and document reason for call. If symptomatic, describe symptoms. ---Caller states her husband cut his middle toenail and cut the cuticle.  States "it looks bad" and she is concerned about it getting infected. She cleansed the skin and applied polysporin ointment. She is wanting to verify she is treating the cut appropriately. Does the PT have any chronic conditions? (i.e. diabetes, asthma, etc.) ---Yes List chronic conditions. 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 Time Eilene Ghazi Time) Disposition Final User 07/07/2016 10:04:26 AM Home Care Yes Julien Girt, RN, [facility] Advice Given Per Guideline HOME CARE: You should be able to treat this at home. REASSURANCE: It sounds like a small cut or scrape that we can treat at home. * Wash the wound with soap and water for 5 minutes. * Option: A TEFLA dressing won't stick to the wound when it is removed. - * Apply an ANTIBIOTIC OINTMENT (e.g., OTC Bacitracin), covered by a Band-Aid or dressing. Change daily or if it becomes wet. * You can use a liquid skin bandage instead of antibiotic ointment and a dressing or a Band-Aid. * BENEFITS: Liquid skin bandage has several benefits when compared to a regular bandage (e.g., a dressing or a Band-Aid). You only need to put a liquid bandage on once to minor cuts and scrapes. It helps stop minor bleeding. It seals the wound and may promote faster healing and lower infection rates. However, it is also more expensive. * For pain relief, take acetaminophen, ibuprofen, or naproxen. * Looks infected (pus, redness, increasing tenderness) * Doesn't heal within 14 days * You become worse. CARE ADVICE given per Cuts and Lacerations (Adult) guideline. PLEASE NOTE: All timestamps contained within this report are represented as [place] Standard Time. CONFIDENTIALTY NOTICE: This fax transmission is intended only for the addressee. It contains information that is legally privileged, confidential or otherwise protected  from use or disclosure. If you are not the  intended recipient, you are strictly prohibited from reviewing, disclosing, copying using or disseminating any of this information or taking any action in reliance on or regarding this information. If you have received this fax in error, please notify us immediately by telephone so that we can arrange for its return to [place]. Phone: [phone], Toll-Free: [phone], Fax: [phone] Page: 2 of 2 Call Id: [id number]

## 2016-07-07 NOTE — Telephone Encounter (Signed)
Wife advised. 

## 2016-07-07 NOTE — Telephone Encounter (Signed)
Patient Name: [name]  DOB: 1941/01/12    Initial Comment Caller states husband cut toe nails down too low, cut skin, had sepsis before, and wondering what to do for him. Bleeds easily, and cancer PT.    Nurse Assessment  Nurse: Julien Girt, RN, [facility] Free Date/Time Eilene Ghazi Time): 07/07/2016 9:52:07 AM  Confirm and document reason for call. If symptomatic, describe symptoms. ---Caller states her husband cut his middle toenail and cut the cuticle. States "it looks bad" and she is concerned about it getting infected. She cleansed the skin and applied polysporin ointment. She is wanting to verify she is treating the cut appropriately.  Does the patient have any new or worsening symptoms? ---Yes  Will a triage be completed? ---Yes  Related visit to physician within the last 2 weeks? ---No  Does the PT have any chronic conditions? (i.e. diabetes, asthma, etc.) ---Yes  List chronic conditions. 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 ---No     Guidelines    Guideline Title Affirmed Question Affirmed Notes  Cuts and Lacerations Minor cut or scratch    Final Disposition User   [place], RN, Almyra Free    Disagree/Comply: Comply

## 2016-07-07 NOTE — Telephone Encounter (Signed)
Local care sounds reasonable.  Always glad to see patient if concerns, if worsening.  Thanks.

## 2016-07-30 ENCOUNTER — Other Ambulatory Visit: Payer: Self-pay | Admitting: Family Medicine

## 2016-07-30 NOTE — Telephone Encounter (Signed)
Per records, he had tapered to 2 tabs twice a day.   I updated the sig.  Thanks.  Sent.

## 2016-07-30 NOTE — Telephone Encounter (Signed)
Electronic refill request. Gabapentin Last office visit:   11/15/15 Last Filled:   05/13/2016    ? Quantity Please advise.

## 2016-07-30 NOTE — Telephone Encounter (Signed)
Mrs Yordy left v/m requesting cb with status of gabapentin refill. Pt will be out of med on 07/31/16.

## 2016-08-24 ENCOUNTER — Telehealth: Payer: Self-pay | Admitting: *Deleted

## 2016-08-24 NOTE — Telephone Encounter (Signed)
He has weakness for many years I do not know if it's new or not If they want to bring him in tomorrow I can see him in the morning or around lunch with labs

## 2016-08-24 NOTE — Telephone Encounter (Signed)
Wife states patient is weaker in the arms and legs. Is having an hard time getting up from chairs and slow ambulating. Wants to know if this is normal or does he need to be seen sooner than 8/21?

## 2016-08-24 NOTE — Telephone Encounter (Signed)
Notified of message below. Will call back if patient agrees to be seen

## 2016-09-22 ENCOUNTER — Telehealth: Payer: Self-pay | Admitting: Hematology and Oncology

## 2016-09-22 ENCOUNTER — Encounter (INDEPENDENT_AMBULATORY_CARE_PROVIDER_SITE_OTHER): Payer: Self-pay

## 2016-09-22 ENCOUNTER — Ambulatory Visit (HOSPITAL_BASED_OUTPATIENT_CLINIC_OR_DEPARTMENT_OTHER): Payer: Medicare Other | Admitting: Hematology and Oncology

## 2016-09-22 ENCOUNTER — Other Ambulatory Visit (HOSPITAL_BASED_OUTPATIENT_CLINIC_OR_DEPARTMENT_OTHER): Payer: Medicare Other

## 2016-09-22 VITALS — BP 128/84 | HR 72 | Temp 98.8°F | Resp 16 | Wt 231.7 lb

## 2016-09-22 DIAGNOSIS — G6181 Chronic inflammatory demyelinating polyneuritis: Secondary | ICD-10-CM | POA: Diagnosis not present

## 2016-09-22 DIAGNOSIS — C91Z0 Other lymphoid leukemia not having achieved remission: Secondary | ICD-10-CM

## 2016-09-22 DIAGNOSIS — G720 Drug-induced myopathy: Secondary | ICD-10-CM | POA: Diagnosis not present

## 2016-09-22 DIAGNOSIS — R300 Dysuria: Secondary | ICD-10-CM | POA: Insufficient documentation

## 2016-09-22 DIAGNOSIS — B0229 Other postherpetic nervous system involvement: Secondary | ICD-10-CM

## 2016-09-22 DIAGNOSIS — R7989 Other specified abnormal findings of blood chemistry: Secondary | ICD-10-CM

## 2016-09-22 DIAGNOSIS — T380X5A Adverse effect of glucocorticoids and synthetic analogues, initial encounter: Secondary | ICD-10-CM

## 2016-09-22 DIAGNOSIS — R945 Abnormal results of liver function studies: Principal | ICD-10-CM

## 2016-09-22 LAB — COMPREHENSIVE METABOLIC PANEL
ALBUMIN: 3.4 g/dL — AB (ref 3.5–5.0)
ALT: 88 U/L — ABNORMAL HIGH (ref 0–55)
AST: 73 U/L — ABNORMAL HIGH (ref 5–34)
Alkaline Phosphatase: 111 U/L (ref 40–150)
Anion Gap: 6 mEq/L (ref 3–11)
BILIRUBIN TOTAL: 1.08 mg/dL (ref 0.20–1.20)
BUN: 12.7 mg/dL (ref 7.0–26.0)
CO2: 25 meq/L (ref 22–29)
CREATININE: 1.1 mg/dL (ref 0.7–1.3)
Calcium: 8.9 mg/dL (ref 8.4–10.4)
Chloride: 106 mEq/L (ref 98–109)
EGFR: 68 mL/min/{1.73_m2} — ABNORMAL LOW (ref >90)
GLUCOSE: 125 mg/dL (ref 70–140)
Potassium: 4.3 mEq/L (ref 3.5–5.1)
SODIUM: 136 meq/L (ref 136–145)
TOTAL PROTEIN: 6.2 g/dL — AB (ref 6.4–8.3)

## 2016-09-22 LAB — CBC WITH DIFFERENTIAL/PLATELET
BASO%: 0.8 % (ref 0.0–2.0)
Basophils Absolute: 0 10*3/uL (ref 0.0–0.1)
EOS%: 1.1 % (ref 0.0–7.0)
Eosinophils Absolute: 0 10*3/uL (ref 0.0–0.5)
HCT: 32.7 % — ABNORMAL LOW (ref 38.4–49.9)
HEMOGLOBIN: 11.4 g/dL — AB (ref 13.0–17.1)
LYMPH%: 42.5 % (ref 14.0–49.0)
MCH: 39.1 pg — ABNORMAL HIGH (ref 27.2–33.4)
MCHC: 34.9 g/dL (ref 32.0–36.0)
MCV: 112.1 fL — ABNORMAL HIGH (ref 79.3–98.0)
MONO#: 0.2 10*3/uL (ref 0.1–0.9)
MONO%: 5 % (ref 0.0–14.0)
NEUT%: 50.6 % (ref 39.0–75.0)
NEUTROS ABS: 2.2 10*3/uL (ref 1.5–6.5)
Platelets: 293 10*3/uL (ref 140–400)
RBC: 2.91 10*6/uL — AB (ref 4.20–5.82)
RDW: 17.3 % — AB (ref 11.0–14.6)
WBC: 4.4 10*3/uL (ref 4.0–10.3)
lymph#: 1.9 10*3/uL (ref 0.9–3.3)

## 2016-09-22 NOTE — Telephone Encounter (Signed)
Scheduled appt per 8/21 los - Gave patient AVS and calender per los.  

## 2016-09-23 ENCOUNTER — Encounter: Payer: Self-pay | Admitting: Hematology and Oncology

## 2016-09-23 NOTE — Assessment & Plan Note (Signed)
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 He is also quite sedated from multiple different medications. I recommend a trial of reducing gabapentin. He is currently on 600 mg twice a day So far, he tolerated that well

## 2016-09-23 NOTE — Assessment & Plan Note (Signed)
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

## 2016-09-23 NOTE — Assessment & Plan Note (Signed)
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 I plan to reduce prednisone further in 3 months I will try slow weaning prednisone in the future He has no recent flare of CIDP recently I plan to recheck CK level in his next visit

## 2016-09-23 NOTE — Progress Notes (Signed)
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 T-cell receptor rearrangement study is still pending      12/19/2015 -  Chemotherapy    The patient was started on 10 mg once a week methotrexate. The dose is increased to 12.5 mg starting 01/16/16        INTERVAL HISTORY: Please see below for problem oriented charting. He returns with his daughter and wife Over the past few months, since the last time I saw him, he has gotten somewhat weaker with poor strength He also felt tired easily There is no recent infection He denies worsening peripheral neuropathy His daughter felt that he might be getting somewhat depressed because he has lost significant function and was not able to perform regular activities of daily living He has problems getting up from a sitting position There is no reported falls recently His postherpetic neuralgia is stable  REVIEW OF SYSTEMS:   Constitutional: Denies fevers, chills or abnormal weight loss Eyes: Denies blurriness of vision Ears, nose, mouth, throat, and face: Denies mucositis or sore throat Respiratory: Denies cough, dyspnea or wheezes Cardiovascular: Denies palpitation, chest discomfort or lower extremity swelling Gastrointestinal:  Denies nausea, heartburn or change in bowel habits Skin: Denies abnormal skin rashes Lymphatics: Denies new lymphadenopathy or easy bruising Behavioral/Psych: Mood is stable, no new changes  All other systems were reviewed with the patient and are negative.  I have reviewed the past medical history, past surgical history, social history and family history with the patient and they are unchanged from previous note.  ALLERGIES:  is allergic to sulfa antibiotics and sulfonamide  derivatives.  MEDICATIONS:  Current Outpatient Prescriptions  Medication Sig Dispense Refill  . acetaminophen (TYLENOL) 325 MG tablet Take 650 mg by mouth every 6 (six) hours as needed.    . aspirin EC 81 MG tablet Take 162 mg by mouth daily.    . CALCIUM CARB-CHOLECALCIFEROL PO Take 1,250 mg by mouth daily.    . Cyanocobalamin (VITAMIN B-12) 500 MCG SUBL Place 1 tablet under the tongue daily.     . econazole nitrate 1 % cream Apply 1 application topically daily as needed.    . folic acid (FOLVITE) 400 MCG tablet Take 400 mcg by mouth daily.    . gabapentin (NEURONTIN) 300 MG capsule Take 2 capsules (600 mg total) by mouth 2 (two) times daily.    . gabapentin (NEURONTIN) 300 MG capsule Take 2 capsules (600 mg total) by mouth 2 (two) times daily. 120 capsule 5  . ketoconazole (NIZORAL) 2 % shampoo Apply 1 application topically as needed.     . methotrexate 2.5 MG tablet Take 5 tablets (12.5 mg total) by mouth once a week. Caution: Chemotherapy. Protect from light. 30 tablet 11  . Multiple Vitamin (MULTIVITAMIN) tablet Take 1 tablet by mouth daily.      . ondansetron (ZOFRAN) 8 MG tablet Take 1 tablet (8 mg total) by mouth every 8 (eight) hours as needed for nausea. (Patient not taking: Reported on 03/12/2016) 30 tablet 3  . predniSONE (DELTASONE) 5 MG tablet Take 5 mg by mouth on Mondays, Wednesdays and Fridays and take 10 mg daily for other days of the week 90 tablet 1  . timolol (TIMOPTIC) 0.5 % ophthalmic solution Place 1   drop into both eyes at bedtime. One drop in each eye at bedtime.    . triamcinolone (KENALOG) 0.025 % cream Apply 1 application topically as needed.     No current facility-administered medications for this visit.     PHYSICAL EXAMINATION: ECOG PERFORMANCE STATUS: 2 - Symptomatic, <50% confined to bed  Vitals:   09/22/16 1259  BP: 128/84  Pulse: 72  Resp: 16  Temp: 98.8 F (37.1 C)  SpO2: 94%   Filed Weights   09/22/16 1259  Weight: 231 lb 11.2 oz (105.1 kg)     GENERAL:alert, no distress and comfortable SKIN: skin color, texture, turgor are normal, no rashes or significant lesions EYES: normal, Conjunctiva are pink and non-injected, sclera clear OROPHARYNX:no exudate, no erythema and lips, buccal mucosa, and tongue normal  NECK: supple, thyroid normal size, non-tender, without nodularity LYMPH:  no palpable lymphadenopathy in the cervical, axillary or inguinal LUNGS: clear to auscultation and percussion with normal breathing effort HEART: regular rate & rhythm and no murmurs and no lower extremity edema ABDOMEN:abdomen soft, non-tender and normal bowel sounds Musculoskeletal:no cyanosis of digits and no clubbing.  Noted steroid myopathy NEURO: alert & oriented x 3 with fluent speech, no focal motor/sensory deficits  LABORATORY DATA:  I have reviewed the data as listed    Component Value Date/Time   NA 136 09/22/2016 1245   K 4.3 09/22/2016 1245   CL 107 02/12/2016 0930   CO2 25 09/22/2016 1245   GLUCOSE 125 09/22/2016 1245   BUN 12.7 09/22/2016 1245   CREATININE 1.1 09/22/2016 1245   CALCIUM 8.9 09/22/2016 1245   PROT 6.2 (L) 09/22/2016 1245   ALBUMIN 3.4 (L) 09/22/2016 1245   AST 73 (H) 09/22/2016 1245   ALT 88 (H) 09/22/2016 1245   ALKPHOS 111 09/22/2016 1245   BILITOT 1.08 09/22/2016 1245   GFRNONAA 58 (L) 02/12/2016 0930   GFRAA >60 02/12/2016 0930    No results found for: SPEP, UPEP  Lab Results  Component Value Date   WBC 4.4 09/22/2016   NEUTROABS 2.2 09/22/2016   HGB 11.4 (L) 09/22/2016   HCT 32.7 (L) 09/22/2016   MCV 112.1 (H) 09/22/2016   PLT 293 09/22/2016      Chemistry      Component Value Date/Time   NA 136 09/22/2016 1245   K 4.3 09/22/2016 1245   CL 107 02/12/2016 0930   CO2 25 09/22/2016 1245   BUN 12.7 09/22/2016 1245   CREATININE 1.1 09/22/2016 1245      Component Value Date/Time   CALCIUM 8.9 09/22/2016 1245   ALKPHOS 111 09/22/2016 1245   AST 73 (H) 09/22/2016 1245   ALT 88 (H)  09/22/2016 1245   BILITOT 1.08 09/22/2016 1245     ASSESSMENT & PLAN:  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HCC) He tolerated methotrexate well at 12.5 mg dose Except for his macrocytic anemia, which very well could be related to methotrexate, lymphocytosis had resolved I recommend continue the same dose for now I plan to see him back in 3 months for further review  CIDP (chronic inflammatory demyelinating polyneuropathy) (HCC) The patient had worsening neuropathy in 2017 despite being on prednisone therapy 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 He is also quite sedated from multiple different medications. I recommend a trial of reducing gabapentin. He is currently on 600 mg twice a day So far, he tolerated that well  LFT elevation He has chronic elevated liver function test, not worse since methotrexate treatment. 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 Urinalysis, Microscopic - [facility]    Standing Status:   Future    Standing Expiration Date:   10/27/2017  . CK, total    Standing Status:   Future    Standing Expiration Date:   10/27/2017   All questions were answered. The patient knows to call the clinic with any problems, questions or concerns. No barriers to learning was detected. I spent 20 minutes counseling the patient face to face. The total time spent in the appointment was 30 minutes and more than 50% was on counseling and review of test results     Heath Lark, MD 09/23/2016 3:46 PM

## 2016-09-23 NOTE — Assessment & Plan Note (Signed)
He has chronic elevated liver function test, not worse since methotrexate treatment. Continue close monitoring The upper limits of LFTs is only 1-1/2 times I will continue same dose methotrexate and monitor closely

## 2016-09-23 NOTE — Assessment & Plan Note (Signed)
He tolerated methotrexate well at 12.5 mg dose Except for his macrocytic anemia, which very well could be related to methotrexate, lymphocytosis had resolved I recommend continue the same dose for now I plan to see him back in 3 months for further review

## 2016-10-11 ENCOUNTER — Other Ambulatory Visit: Payer: Self-pay | Admitting: Family Medicine

## 2016-10-11 DIAGNOSIS — T50905A Adverse effect of unspecified drugs, medicaments and biological substances, initial encounter: Principal | ICD-10-CM

## 2016-10-11 DIAGNOSIS — R739 Hyperglycemia, unspecified: Secondary | ICD-10-CM

## 2016-10-14 ENCOUNTER — Ambulatory Visit (INDEPENDENT_AMBULATORY_CARE_PROVIDER_SITE_OTHER): Payer: Medicare Other

## 2016-10-14 VITALS — BP 118/76 | HR 78 | Temp 97.5°F | Ht 76.5 in | Wt 229.8 lb

## 2016-10-14 DIAGNOSIS — T50905A Adverse effect of unspecified drugs, medicaments and biological substances, initial encounter: Secondary | ICD-10-CM

## 2016-10-14 DIAGNOSIS — Z0000 Encounter for general adult medical examination without abnormal findings: Secondary | ICD-10-CM

## 2016-10-14 DIAGNOSIS — R739 Hyperglycemia, unspecified: Secondary | ICD-10-CM | POA: Diagnosis not present

## 2016-10-14 LAB — LIPID PANEL
CHOLESTEROL: 131 mg/dL (ref 0–200)
HDL: 30.9 mg/dL — AB (ref >39.00)
NonHDL: 99.82
TRIGLYCERIDES: 248 mg/dL — AB (ref 0.0–149.0)
Total CHOL/HDL Ratio: 4
VLDL: 49.6 mg/dL — ABNORMAL HIGH (ref 0.0–40.0)

## 2016-10-14 LAB — HEMOGLOBIN A1C: Hgb A1c MFr Bld: 6.1 % (ref 4.6–6.5)

## 2016-10-14 LAB — LDL CHOLESTEROL, DIRECT: Direct LDL: 67 mg/dL

## 2016-10-14 NOTE — Progress Notes (Signed)
Pre visit review using our clinic review tool, if applicable. No additional management support is needed unless otherwise documented below in the visit note. 

## 2016-10-14 NOTE — Patient Instructions (Signed)
[name] , Thank you for taking time to come for your Medicare Wellness Visit. I appreciate your ongoing commitment to your health goals. Please review the following plan we discussed and let me know if I can assist you in the future.   These are the goals we discussed: Goals    . Increase physical activity          Starting 10/14/2016, I will continue to exercise at least 30 min daily.        This is a list of the screening recommended for you and due dates:  Health Maintenance  Topic Date Due  . Flu Shot  05/02/2017*  . Tetanus Vaccine  11/10/2020  . Pneumonia vaccines  Completed  *Topic was postponed. The date shown is not the original due date.   Preventive Care for Adults  A healthy lifestyle and preventive care can promote health and wellness. Preventive health guidelines for adults include the following key practices.  . A routine yearly physical is a good way to check with your health care provider about your health and preventive screening. It is a chance to share any concerns and updates on your health and to receive a thorough exam.  . Visit your dentist for a routine exam and preventive care every 6 months. Brush your teeth twice a day and floss once a day. Good oral hygiene prevents tooth decay and gum disease.  . The frequency of eye exams is based on your age, health, family medical history, use  of contact lenses, and other factors. Follow your health care provider's ecommendations for frequency of eye exams.  . Eat a healthy diet. Foods like vegetables, fruits, whole grains, low-fat dairy products, and lean protein foods contain the nutrients you need without too many calories. Decrease your intake of foods high in solid fats, added sugars, and salt. Eat the right amount of calories for you. Get information about a proper diet from your health care provider, if necessary.  . Regular physical exercise is one of the most important things you can do for your health.  Most adults should get at least 150 minutes of moderate-intensity exercise (any activity that increases your heart rate and causes you to sweat) each week. In addition, most adults need muscle-strengthening exercises on 2 or more days a week.  Silver Sneakers may be a benefit available to you. To determine eligibility, you may visit the website: www.silversneakers.com or contact program at (272) 385-1039 Mon-Fri between 8AM-8PM.   . Maintain a healthy weight. The body mass index (BMI) is a screening tool to identify possible weight problems. It provides an estimate of body fat based on height and weight. Your health care provider can find your BMI and can help you achieve or maintain a healthy weight.   For adults 20 years and older: ? A BMI below 18.5 is considered underweight. ? A BMI of 18.5 to 24.9 is normal. ? A BMI of 25 to 29.9 is considered overweight. ? A BMI of 30 and above is considered obese.   . Maintain normal blood lipids and cholesterol levels by exercising and minimizing your intake of saturated fat. Eat a balanced diet with plenty of fruit and vegetables. Blood tests for lipids and cholesterol should begin at age 20 and be repeated every 5 years. If your lipid or cholesterol levels are high, you are over 50, or you are at high risk for heart disease, you may need your cholesterol levels checked more frequently. Ongoing high lipid  and cholesterol levels should be treated with medicines if diet and exercise are not working.  . If you smoke, find out from your health care provider how to quit. If you do not use tobacco, please do not start.  . If you choose to drink alcohol, please do not consume more than 2 drinks per day. One drink is considered to be 12 ounces (355 mL) of beer, 5 ounces (148 mL) of wine, or 1.5 ounces (44 mL) of liquor.  . If you are 73-55 years old, ask your health care provider if you should take aspirin to prevent strokes.  . Use sunscreen. Apply sunscreen  liberally and repeatedly throughout the day. You should seek shade when your shadow is shorter than you. Protect yourself by wearing long sleeves, pants, a wide-brimmed hat, and sunglasses year round, whenever you are outdoors.  . Once a month, do a whole body skin exam, using a mirror to look at the skin on your back. Tell your health care provider of new moles, moles that have irregular borders, moles that are larger than a pencil eraser, or moles that have changed in shape or color.

## 2016-10-14 NOTE — Progress Notes (Signed)
Subjective:   [name] is a 76 y.o. male who presents for Medicare Annual/Subsequent preventive examination.  Review of Systems:  N/A Cardiac Risk Factors include: advanced age (>82men, >77 women);male gender;hypertension     Objective:    Vitals: BP 118/76 (BP Location: Right Arm, Patient Position: Sitting, Cuff Size: Normal)   Pulse 78   Temp (!) 97.5 F (36.4 C) (Oral)   Ht 6' 4.5" (1.943 m) Comment: shoes  Wt 229 lb 12 oz (104.2 kg)   SpO2 92%   BMI 27.60 kg/m   Body mass index is 27.6 kg/m.  Tobacco History  Smoking Status  . Never Smoker  Smokeless Tobacco  . Never Used     Counseling given: No   Past Medical History:  Diagnosis Date  . Basal cell carcinoma of skin    skin cancer, resected by derm (Dr. Evorn Gong)  . Cholelithiasis 01/95   via abdominal ultrasound  . 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 Diverticulosis of colon 01/95  . History of kidney stones   . Hyperglycemia    Related to chronic steroid use  . Hypertension 08/07  . Nephrolithiasis 1993   X 2  . Polyneuropathy     Chronic inflammatory demylenating - Prednisone per [facility]  . Syncope 08/05/07   Presumed hypotension   Past Surgical History:  Procedure Laterality Date  . CHOLECYSTECTOMY  10/27/99   [facility] Done)  . DOPPLER ECHOCARDIOGRAPHY  08/01   T.R.; T.R.; P/R. borderline LVH  . ESOPHAGOGASTRODUODENOSCOPY  12/19/05   gastritis; [facility]  . HERNIA REPAIR  06/17/04   Laparoscopic, bilateral  . [place]  . LUMBAR DISC SURGERY  2000   L3/4 rupture  . MOHS SURGERY Left 01/09/2016   left side of nose  . nephrolithiasis     Family History  Problem Relation Age of Onset  . Hypertension Mother   . Heart disease Mother        MI  . Kidney disease Father        Kidney failure  . Parkinsonism Father   . Cancer Sister        Acute leukemia  . Arthritis Sister   . Cancer Sister        ovarian ca  . Cancer Brother        liver cancer   . Diabetes Neg Hx   . Stroke Neg Hx   . Prostate cancer Neg Hx   . Colon cancer Neg Hx    History  Sexual Activity  . Sexual activity: No    Outpatient Encounter Prescriptions as of 10/14/2016  Medication Sig  . acetaminophen (TYLENOL) 325 MG tablet Take 650 mg by mouth every 6 (six) hours as needed.  Marland Kitchen aspirin EC 81 MG tablet Take 162 mg by mouth daily.  Marland Kitchen CALCIUM CARB-CHOLECALCIFEROL PO Take 1,250 mg by mouth daily.  . Cyanocobalamin (VITAMIN B-12) 500 MCG SUBL Place 1 tablet under the tongue daily.   Marland Kitchen econazole nitrate 1 % cream Apply 1 application topically daily as needed.  . folic acid (FOLVITE) 448 MCG tablet Take 400 mcg by mouth daily.  Marland Kitchen gabapentin (NEURONTIN) 300 MG capsule Take 2 capsules (600 mg total) by mouth 2 (two) times daily.  Marland Kitchen gabapentin (NEURONTIN) 300 MG capsule Take 2 capsules (600 mg total) by mouth 2 (two) times daily.  Marland Kitchen ketoconazole (NIZORAL) 2 % shampoo Apply 1 application topically as needed.   . methotrexate 2.5 MG tablet Take 5 tablets (12.5  mg total) by mouth once a week. Caution: Chemotherapy. Protect from light.  . Multiple Vitamin (MULTIVITAMIN) tablet Take 1 tablet by mouth daily.    . ondansetron (ZOFRAN) 8 MG tablet Take 1 tablet (8 mg total) by mouth every 8 (eight) hours as needed for nausea.  . predniSONE (DELTASONE) 5 MG tablet Take 5 mg by mouth on Mondays, Wednesdays and Fridays and take 10 mg daily for other days of the week  . timolol (TIMOPTIC) 0.5 % ophthalmic solution Place 1 drop into both eyes at bedtime. One drop in each eye at bedtime.  . triamcinolone (KENALOG) 0.025 % cream Apply 1 application topically as needed.   No facility-administered encounter medications on file as of 10/14/2016.     Activities of Daily Living In your present state of health, do you have any difficulty performing the following activities: 10/14/2016  Hearing? Y  Vision? Y  Difficulty concentrating or making decisions? N  Walking or climbing stairs? Y    Dressing or bathing? N  Doing errands, shopping? Y  Preparing Food and eating ? N  Using the Toilet? N  In the past six months, have you accidently leaked urine? N  Do you have problems with loss of bowel control? N  Managing your Medications? N  Managing your Finances? N  Housekeeping or managing your Housekeeping? Y  Some recent data might be hidden    Patient Care Team: Tonia Ghent, MD as PCP - General (Family Medicine) Dingeldein, Remo Lipps, MD as Referring Physician (Ophthalmology)   Assessment:     Hearing Screening   125Hz  250Hz  500Hz  1000Hz  2000Hz  3000Hz  4000Hz  6000Hz  8000Hz   Right ear:   40 40 40  40    Left ear:   40 40 40  0    Vision Screening Comments: Last vision exam in Oct 2017 with Dr. Sandra Cockayne   Exercise Activities and Dietary recommendations Current Exercise Habits: Home exercise routine, Type of exercise: Other - see comments (physical therapy exercises), Time (Minutes): 30, Frequency (Times/Week): 7, Weekly Exercise (Minutes/Week): 210, Intensity: Mild, Exercise limited by: None identified  Goals    . Increase physical activity          Starting 10/14/2016, I will continue to exercise at least 30 min daily.       Fall Risk Fall Risk  10/14/2016 09/24/2015 08/16/2014 12/16/2012  Falls in the past year? No Yes Yes Yes  Comment - Pt's knees buckled and he fell to knees on ground - -  Number falls in past yr: - 1 1 2  or more  Injury with Fall? - No No -  Risk for fall due to : - Impaired balance/gait;Impaired mobility;Impaired vision - -  Follow up - Falls evaluation completed - -   Depression Screen PHQ 2/9 Scores 10/14/2016 09/24/2015 08/16/2014 12/16/2012  PHQ - 2 Score 1 0 0 0  PHQ- 9 Score 1 - - -    Cognitive Function MMSE - Mini Mental State Exam 10/14/2016 09/24/2015  Orientation to time 5 5  Orientation to Place 5 5  Registration 3 3  Attention/ Calculation 0 0  Recall 3 3  Language- name 2 objects 0 0  Language- repeat 1 1  Language-  follow 3 step command 3 3  Language- read & follow direction 0 0  Write a sentence 0 0  Copy design 0 0  Total score 20 20       PLEASE NOTE: A Mini-Cog screen was completed. Maximum score is 20. A  value of 0 denotes this part of Folstein MMSE was not completed or the patient failed this part of the Mini-Cog screening.   Mini-Cog Screening Orientation to Time - Max 5 pts Orientation to Place - Max 5 pts Registration - Max 3 pts Recall - Max 3 pts Language Repeat - Max 1 pts Language Follow 3 Step Command - Max 3 pts   Immunization History  Administered Date(s) Administered  . Influenza Split 11/11/2010, 12/17/2011  . Influenza Whole 12/10/2006, 11/07/2007, 11/22/2008, 11/04/2009  . Influenza,inj,Quad PF,6+ Mos 12/16/2012, 11/15/2015  . Influenza-Unspecified 12/01/2013, 12/03/2014  . Pneumococcal Conjugate-13 09/24/2015  . Pneumococcal Polysaccharide-23 04/08/2006  . Td 11/28/2001  . Tdap 11/11/2010   Screening Tests Health Maintenance  Topic Date Due  . INFLUENZA VACCINE  05/02/2017 (Originally 09/02/2016)  . TETANUS/TDAP  11/10/2020  . PNA vac Low Risk Adult  Completed      Plan:     I have personally reviewed and addressed the Medicare Annual Wellness questionnaire and have noted the following in the patient's chart:  A. Medical and social history B. Use of alcohol, tobacco or illicit drugs  C. Current medications and supplements D. Functional ability and status E.  Nutritional status F.  Physical activity G. Advance directives H. List of other physicians I.  Hospitalizations, surgeries, and ER visits in previous 12 months J.  [place] to include hearing, vision, cognitive, depression L. Referrals and appointments - none  In addition, I have reviewed and discussed with patient certain preventive protocols, quality metrics, and best practice recommendations. A written personalized care plan for preventive services as well as general preventive health  recommendations were provided to patient.  See attached scanned questionnaire for additional information.   Signed,   Lindell Noe, MHA, BS, LPN Health Coach

## 2016-10-14 NOTE — Progress Notes (Signed)
PCP notes:   Health maintenance:  Flu vaccine - addressed  Abnormal screenings:   Hearing - failed Depression score: 1  Patient concerns:   None  Nurse concerns:  PCP is asked to please consider [facility] skilled nursing once weekly for at least 2 weeks for wound care, management, and education. Patient has slow healing wound to right anterior lower leg.   Next PCP appt:   10/19/16 @ 0945

## 2016-10-15 NOTE — Progress Notes (Signed)
   Subjective:    Patient ID: [name], male    DOB: 08-27-1940, 76 y.o.   MRN: [id number]  HPI I reviewed health advisor's note, was available for consultation, and agree with documentation and plan.    Review of Systems     Objective:   Physical Exam        Assessment & Plan:

## 2016-10-19 ENCOUNTER — Encounter: Payer: Medicare Other | Admitting: Family Medicine

## 2016-10-27 ENCOUNTER — Encounter: Payer: Medicare Other | Admitting: Family Medicine

## 2016-11-02 ENCOUNTER — Ambulatory Visit (INDEPENDENT_AMBULATORY_CARE_PROVIDER_SITE_OTHER): Payer: Medicare Other | Admitting: Family Medicine

## 2016-11-02 ENCOUNTER — Telehealth: Payer: Self-pay

## 2016-11-02 ENCOUNTER — Encounter: Payer: Self-pay | Admitting: Family Medicine

## 2016-11-02 VITALS — BP 118/76 | HR 78 | Temp 97.5°F | Ht 76.5 in | Wt 229.8 lb

## 2016-11-02 DIAGNOSIS — Z23 Encounter for immunization: Secondary | ICD-10-CM | POA: Diagnosis not present

## 2016-11-02 DIAGNOSIS — Z7952 Long term (current) use of systemic steroids: Secondary | ICD-10-CM | POA: Diagnosis not present

## 2016-11-02 DIAGNOSIS — Z7189 Other specified counseling: Secondary | ICD-10-CM | POA: Diagnosis not present

## 2016-11-02 DIAGNOSIS — I872 Venous insufficiency (chronic) (peripheral): Secondary | ICD-10-CM

## 2016-11-02 DIAGNOSIS — B0229 Other postherpetic nervous system involvement: Secondary | ICD-10-CM | POA: Diagnosis not present

## 2016-11-02 DIAGNOSIS — R5381 Other malaise: Secondary | ICD-10-CM | POA: Diagnosis not present

## 2016-11-02 NOTE — Telephone Encounter (Signed)
Called with below message. Wife states that patient saw Dr. Damita Dunnings today and got a unna boot on leg. Dr. Damita Dunnings will be making a referral to home health. Wife will call back regarding physical therapy, wife does not think that PT will help, but OT may help. Wife will call back when home health starts coming out.

## 2016-11-02 NOTE — Telephone Encounter (Signed)
We had extensive discussion about his weakness in the past My recommendation for most patients is to undergo formal PT/rehab to work on muscle strength that he is unable to perform. If he is able to work on everything, then why the discussion about weakness in the first place?

## 2016-11-02 NOTE — Telephone Encounter (Signed)
Wife called asking if it was OK for pt to get the flu shot. He is getting weaker is the only progression the family has seen. With pt multiple co-morbidities defer to Dr Alvy Bimler for her opinion.  Wife phone [phone]

## 2016-11-02 NOTE — Progress Notes (Signed)
Hearing - failed.  Declined hearing aids.   Depression score: 1  Declined colonoscopy at this point.  No FH and no blood in stool.  He wanted to defer given his other issues.    Advance directive- wife designated if patient were incapacitated  Discussed [facility] skilled nursing for wound care, management, and education. Patient has slow healing wound to right anterior lower leg. See plan.   ================================================= Still with R sided facial pain.  Stable, bothersome but he is putting up with it.  Still on gabapentin, at the lowest tolerable dose for pain control.  He has chronic pain but not as much of the bad shooting episodes.    Still on MTX per hematology.  I'll defer.  D/w pt.    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  I don't know what would be best for his situation now, d/w pt.  They are trying to get his bathroom remodeled for improved access.  Leg weakness is the main change in the meantime.  Still using walker at baseline, has a wheelchair to use for distances.    PMH and SH reviewed  ROS: Per HPI unless specifically indicated in ROS section   Meds, vitals, and allergies reviewed.   GEN: nad, alert and oriented HEENT: mucous membranes moist NECK: supple w/o LA CV: rrr.  PULM: ctab, no inc wob ABD: soft, +bs EXT: no edema SKIN: no acute rash but chronic stasis changes on the bilateral shins. He has some small punctate epithelial disruptions on the right lateral shin that have a scant amount of clear fluid associated. No frank ulceration otherwise. Dorsalis pedis pulse intact bilaterally.

## 2016-11-02 NOTE — Patient Instructions (Signed)
Don't change your meds for now.  I'll check on getting home health set up to change the unna boots in 1 week.  If concerns in the meantime then let us know.  Take care.  Glad to see you.

## 2016-11-02 NOTE — Telephone Encounter (Signed)
OK for flu shot Has the patient returned back to PT/Rehab?

## 2016-11-02 NOTE — Telephone Encounter (Signed)
S/w wife per Dr Alvy Bimler OK for flu shot. She says he is doing his exercises. He is not under PT at present. He is following the sheet that [facility] gave him. He is getting on the riding lawnmower. He is using a walker in the yard. They are keeping him as "active as they can".

## 2016-11-03 DIAGNOSIS — I872 Venous insufficiency (chronic) (peripheral): Secondary | ICD-10-CM | POA: Insufficient documentation

## 2016-11-03 NOTE — Assessment & Plan Note (Signed)
Wife designated if patient were incapacitated.  

## 2016-11-03 NOTE — Assessment & Plan Note (Signed)
He continues to get weaker and I don't know what else can be done at this point to prevent further weakening. I will ask for input on this.

## 2016-11-03 NOTE — Assessment & Plan Note (Signed)
Continue gabapentin as is. He is likely on the lowest tolerable dose, meaning the lowest dose that can control his pain some.

## 2016-11-03 NOTE — Assessment & Plan Note (Addendum)
With stasis dermatitis on both shins. Starting to leak some fluid episodically. If we don't go ahead and treat now and will likely get worse. Discussed with patient about rationale. Unna boot applied to each leg. We'll ask for home health help given his difficult to transport. Would need weekly Unna boots until improved. Okay for outpatient follow-up. No sign of active cellulitis that would need antibiotics. >25 minutes spent in face to face time with patient, >50% spent in counselling or coordination of care.

## 2016-11-03 NOTE — Assessment & Plan Note (Signed)
Not diabetic. Discussed patient.

## 2016-12-15 ENCOUNTER — Ambulatory Visit (HOSPITAL_BASED_OUTPATIENT_CLINIC_OR_DEPARTMENT_OTHER): Payer: Medicare Other | Admitting: Hematology and Oncology

## 2016-12-15 ENCOUNTER — Other Ambulatory Visit (HOSPITAL_BASED_OUTPATIENT_CLINIC_OR_DEPARTMENT_OTHER): Payer: Medicare Other

## 2016-12-15 ENCOUNTER — Encounter: Payer: Self-pay | Admitting: Hematology and Oncology

## 2016-12-15 ENCOUNTER — Telehealth: Payer: Self-pay | Admitting: Hematology and Oncology

## 2016-12-15 DIAGNOSIS — T380X5A Adverse effect of glucocorticoids and synthetic analogues, initial encounter: Secondary | ICD-10-CM

## 2016-12-15 DIAGNOSIS — R945 Abnormal results of liver function studies: Secondary | ICD-10-CM

## 2016-12-15 DIAGNOSIS — C91Z0 Other lymphoid leukemia not having achieved remission: Secondary | ICD-10-CM

## 2016-12-15 DIAGNOSIS — E785 Hyperlipidemia, unspecified: Secondary | ICD-10-CM

## 2016-12-15 DIAGNOSIS — D472 Monoclonal gammopathy: Secondary | ICD-10-CM

## 2016-12-15 DIAGNOSIS — G6181 Chronic inflammatory demyelinating polyneuritis: Secondary | ICD-10-CM | POA: Diagnosis not present

## 2016-12-15 DIAGNOSIS — G720 Drug-induced myopathy: Secondary | ICD-10-CM

## 2016-12-15 DIAGNOSIS — R300 Dysuria: Secondary | ICD-10-CM

## 2016-12-15 DIAGNOSIS — R7989 Other specified abnormal findings of blood chemistry: Secondary | ICD-10-CM

## 2016-12-15 LAB — COMPREHENSIVE METABOLIC PANEL
ALBUMIN: 3.5 g/dL (ref 3.5–5.0)
ALK PHOS: 116 U/L (ref 40–150)
ALT: 90 U/L — ABNORMAL HIGH (ref 0–55)
AST: 66 U/L — ABNORMAL HIGH (ref 5–34)
Anion Gap: 7 mEq/L (ref 3–11)
BUN: 13.7 mg/dL (ref 7.0–26.0)
CO2: 24 meq/L (ref 22–29)
Calcium: 8.8 mg/dL (ref 8.4–10.4)
Chloride: 107 mEq/L (ref 98–109)
Creatinine: 1.2 mg/dL (ref 0.7–1.3)
GLUCOSE: 123 mg/dL (ref 70–140)
POTASSIUM: 4.1 meq/L (ref 3.5–5.1)
SODIUM: 138 meq/L (ref 136–145)
Total Bilirubin: 1.13 mg/dL (ref 0.20–1.20)
Total Protein: 6.5 g/dL (ref 6.4–8.3)

## 2016-12-15 LAB — CBC WITH DIFFERENTIAL/PLATELET
BASO%: 0.6 % (ref 0.0–2.0)
BASOS ABS: 0 10*3/uL (ref 0.0–0.1)
EOS ABS: 0 10*3/uL (ref 0.0–0.5)
EOS%: 0.8 % (ref 0.0–7.0)
HCT: 33.9 % — ABNORMAL LOW (ref 38.4–49.9)
HGB: 11.2 g/dL — ABNORMAL LOW (ref 13.0–17.1)
LYMPH%: 41.7 % (ref 14.0–49.0)
MCH: 37.3 pg — AB (ref 27.2–33.4)
MCHC: 33 g/dL (ref 32.0–36.0)
MCV: 113 fL — AB (ref 79.3–98.0)
MONO#: 0.3 10*3/uL (ref 0.1–0.9)
MONO%: 5.3 % (ref 0.0–14.0)
NEUT#: 2.5 10*3/uL (ref 1.5–6.5)
NEUT%: 51.6 % (ref 39.0–75.0)
Platelets: 260 10*3/uL (ref 140–400)
RBC: 3 10*6/uL — AB (ref 4.20–5.82)
RDW: 17.1 % — ABNORMAL HIGH (ref 11.0–14.6)
WBC: 4.9 10*3/uL (ref 4.0–10.3)
lymph#: 2 10*3/uL (ref 0.9–3.3)

## 2016-12-15 LAB — URINALYSIS, MICROSCOPIC - CHCC
BILIRUBIN (URINE): NEGATIVE
BLOOD: NEGATIVE
Glucose: NEGATIVE mg/dL
KETONES: NEGATIVE mg/dL
Nitrite: NEGATIVE
Protein: NEGATIVE mg/dL
SPECIFIC GRAVITY, URINE: 1.015 (ref 1.003–1.035)
Urobilinogen, UR: 0.2 mg/dL (ref 0.2–1)
pH: 6.5 (ref 4.6–8.0)

## 2016-12-15 NOTE — Telephone Encounter (Signed)
Scheduled appt per 11/13 los - Gave patient AVS and calender per los.  

## 2016-12-15 NOTE — Progress Notes (Signed)
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 T-cell receptor rearrangement study is still pending      12/19/2015 -  Chemotherapy    The patient was started on 10 mg once a week methotrexate. The dose is increased to 12.5 mg starting 01/16/16        INTERVAL HISTORY: Please see below for problem oriented charting. He returns for further follow-up with his daughter He had developed significant blister in his lower extremity, just completed therapy with compression hose He denies other recent infection He is motivated to exercise He denies recent flare of postherpetic neuralgia No worsening CIDP No recent nausea or vomiting  REVIEW OF SYSTEMS:   Constitutional: Denies fevers, chills or abnormal weight loss Eyes: Denies blurriness of vision Ears, nose, mouth, throat, and face: Denies mucositis or sore throat Respiratory: Denies cough, dyspnea or wheezes Cardiovascular: Denies palpitation, chest discomfort or lower extremity swelling Gastrointestinal:  Denies nausea, heartburn or change in bowel habits Lymphatics: Denies new lymphadenopathy or easy bruising Neurological:Denies numbness, tingling or new weaknesses Behavioral/Psych: Mood is stable, no new changes  All other systems were reviewed with the patient and are negative.  I have reviewed the past medical history, past surgical history, social history and family history with the patient and they are unchanged from previous note.  ALLERGIES:  is allergic to sulfa antibiotics and sulfonamide derivatives.  MEDICATIONS:  Current Outpatient Medications  Medication Sig Dispense Refill  . acetaminophen (TYLENOL) 325 MG tablet  Take 650 mg by mouth every 6 (six) hours as needed.    Marland Kitchen aspirin EC 81 MG tablet Take 162 mg by mouth daily.    Marland Kitchen CALCIUM CARB-CHOLECALCIFEROL PO Take 1,250 mg by mouth daily.    . Cyanocobalamin (VITAMIN B-12) 500 MCG SUBL Place 1 tablet under the tongue daily.     Marland Kitchen econazole nitrate 1 % cream Apply 1 application topically daily as needed.    . folic acid (FOLVITE) 381 MCG tablet Take 400 mcg by mouth daily.    Marland Kitchen gabapentin (NEURONTIN) 300 MG capsule Take 2 capsules (600 mg total) by mouth 2 (two) times daily.    Marland Kitchen ketoconazole (NIZORAL) 2 % shampoo Apply 1 application topically as needed.     . methotrexate 2.5 MG tablet Take 5 tablets (12.5 mg total) by mouth once a week. Caution: Chemotherapy. Protect from light. 30 tablet 11  . Multiple Vitamin (MULTIVITAMIN) tablet Take 1 tablet by mouth daily.      . ondansetron (ZOFRAN) 8 MG tablet Take 1 tablet (8 mg total) by mouth every 8 (eight) hours as needed for nausea. 30 tablet 3  . predniSONE (DELTASONE) 5 MG tablet Take 5 mg by mouth on Mondays, Wednesdays and Fridays and take 10 mg daily for other days of the week 90 tablet 1  . timolol (TIMOPTIC) 0.5 % ophthalmic solution Place 1 drop into both eyes at bedtime. One drop in each eye at bedtime.    . triamcinolone (KENALOG) 0.025 % cream Apply 1 application topically as needed.     No current facility-administered medications for this visit.     PHYSICAL EXAMINATION: ECOG PERFORMANCE STATUS: 2 - Symptomatic, <50% confined to bed  Vitals:   12/15/16 1344  BP: 124/73  Pulse: 73  Resp: 17  Temp: 97.7 F (36.5 C)  SpO2: 97%   Filed Weights   12/15/16 1344  Weight: 227 lb 6.4 oz (103.1 kg)    GENERAL:alert, no distress and comfortable SKIN: Noted discoloration of lower extremity without signs of cellulitis EYES: normal, Conjunctiva are pink and non-injected, sclera clear OROPHARYNX:no exudate, no erythema and lips, buccal mucosa, and tongue normal  NECK: supple, thyroid normal  size, non-tender, without nodularity LYMPH:  no palpable lymphadenopathy in the cervical, axillary or inguinal LUNGS: clear to auscultation and percussion with normal breathing effort HEART: regular rate & rhythm and no murmurs with moderate bilateral lower extremity edema ABDOMEN:abdomen soft, non-tender and normal bowel sounds Musculoskeletal:no cyanosis of digits and no clubbing  NEURO: alert & oriented x 3 with fluent speech, no focal motor/sensory deficits  LABORATORY DATA:  I have reviewed the data as listed    Component Value Date/Time   NA 138 12/15/2016 1319   K 4.1 12/15/2016 1319   CL 107 02/12/2016 0930   CO2 24 12/15/2016 1319   GLUCOSE 123 12/15/2016 1319   BUN 13.7 12/15/2016 1319   CREATININE 1.2 12/15/2016 1319   CALCIUM 8.8 12/15/2016 1319   PROT 6.5 12/15/2016 1319   ALBUMIN 3.5 12/15/2016 1319   AST 66 (H) 12/15/2016 1319   ALT 90 (H) 12/15/2016 1319   ALKPHOS 116 12/15/2016 1319   BILITOT 1.13 12/15/2016 1319   GFRNONAA 58 (L) 02/12/2016 0930   GFRAA >60 02/12/2016 0930    No results found for: SPEP, UPEP  Lab Results  Component Value Date   WBC 4.9 12/15/2016   NEUTROABS 2.5 12/15/2016   HGB 11.2 (L) 12/15/2016   HCT 33.9 (L) 12/15/2016   MCV 113.0 (H) 12/15/2016   PLT 260 12/15/2016      Chemistry      Component Value Date/Time   NA 138 12/15/2016 1319   K 4.1 12/15/2016 1319   CL 107 02/12/2016 0930   CO2 24 12/15/2016 1319   BUN 13.7 12/15/2016 1319   CREATININE 1.2 12/15/2016 1319      Component Value Date/Time   CALCIUM 8.8 12/15/2016 1319   ALKPHOS 116 12/15/2016 1319   AST 66 (H) 12/15/2016 1319   ALT 90 (H) 12/15/2016 1319   BILITOT 1.13 12/15/2016 1319      ASSESSMENT & PLAN:  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HCC) He tolerated methotrexate well at 12.5 mg dose Except for his anemia, which very well could be related to methotrexate, lymphocytosis had resolved I recommend continue the same dose for now I plan to see  him back in 4 months for further review  LFT elevation This is multifactorial, likely due to side effects of methotrexate and also related to recent findings of hyperlipidemia I recommend close follow-up and dietary modification to treat his hyperlipidemia  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 can be associated with autoimmune disorder He tolerated reduced dose prednisone well Currently, he takes prednisone 5 mg on Mondays, Wednesdays and Fridays and to take 10 mg for the rest of the week I will try slow weaning prednisone in the future He has no recent flare of CIDP recently Recheck CK level is pending  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  All questions were answered. The patient knows to call the clinic with any problems, questions or concerns. No barriers to learning was detected. I spent 15 minutes counseling the patient face to face. The total time spent in the appointment was 20 minutes and more than 50% was on counseling and review of test results     Heath Lark, MD 12/15/2016 5:26 PM

## 2016-12-15 NOTE — Assessment & Plan Note (Signed)
T-cell LGL can be associated with autoimmune disorder He tolerated reduced dose prednisone well Currently, he takes prednisone 5 mg on Mondays, Wednesdays and Fridays and to take 10 mg for the rest of the week I will try slow weaning prednisone in the future He has no recent flare of CIDP recently Recheck CK level is pending

## 2016-12-15 NOTE — Assessment & Plan Note (Signed)
He tolerated methotrexate well at 12.5 mg dose Except for his anemia, which very well could be related to methotrexate, lymphocytosis had resolved I recommend continue the same dose for now I plan to see him back in 4 months for further review

## 2016-12-15 NOTE — Assessment & Plan Note (Signed)
The patient has evidence of steroid-induced myopathy and generalized deconditioning He has completed home PT I continue to encourage him to increase exercise activity as tolerated I continue to encourage the patient

## 2016-12-15 NOTE — Assessment & Plan Note (Signed)
This is multifactorial, likely due to side effects of methotrexate and also related to recent findings of hyperlipidemia I recommend close follow-up and dietary modification to treat his hyperlipidemia

## 2016-12-16 LAB — KAPPA/LAMBDA LIGHT CHAINS
Ig Kappa Free Light Chain: 24.4 mg/L — ABNORMAL HIGH (ref 3.3–19.4)
Ig Lambda Free Light Chain: 8.2 mg/L (ref 5.7–26.3)
KAPPA/LAMBDA FLC RATIO: 2.98 — AB (ref 0.26–1.65)

## 2016-12-16 LAB — CK: CK TOTAL: 66 U/L (ref 24–204)

## 2016-12-17 LAB — MULTIPLE MYELOMA PANEL, SERUM
ALBUMIN/GLOB SERPL: 1.2 (ref 0.7–1.7)
ALPHA 1: 0.2 g/dL (ref 0.0–0.4)
ALPHA2 GLOB SERPL ELPH-MCNC: 0.5 g/dL (ref 0.4–1.0)
Albumin SerPl Elph-Mcnc: 3.3 g/dL (ref 2.9–4.4)
B-Globulin SerPl Elph-Mcnc: 0.7 g/dL (ref 0.7–1.3)
Gamma Glob SerPl Elph-Mcnc: 1.5 g/dL (ref 0.4–1.8)
Globulin, Total: 2.9 g/dL (ref 2.2–3.9)
IGM (IMMUNOGLOBIN M), SRM: 15 mg/dL (ref 15–143)
IgA, Qn, Serum: 52 mg/dL — ABNORMAL LOW (ref 61–437)
M Protein SerPl Elph-Mcnc: 1.4 g/dL — ABNORMAL HIGH
TOTAL PROTEIN: 6.2 g/dL (ref 6.0–8.5)

## 2016-12-18 ENCOUNTER — Other Ambulatory Visit: Payer: Self-pay | Admitting: Hematology and Oncology

## 2016-12-18 DIAGNOSIS — D472 Monoclonal gammopathy: Secondary | ICD-10-CM

## 2016-12-20 ENCOUNTER — Other Ambulatory Visit: Payer: Self-pay | Admitting: Family Medicine

## 2016-12-21 NOTE — Telephone Encounter (Signed)
Electronic refill request. Gabapentin Last office visit:   11/02/2016 Last Filled:   05/13/2016  ? Quantity Please advise.

## 2016-12-21 NOTE — Telephone Encounter (Signed)
Sent. Thanks.   

## 2017-01-19 ENCOUNTER — Other Ambulatory Visit: Payer: Self-pay | Admitting: Hematology and Oncology

## 2017-04-05 ENCOUNTER — Other Ambulatory Visit: Payer: Self-pay | Admitting: Hematology and Oncology

## 2017-04-05 DIAGNOSIS — C91Z0 Other lymphoid leukemia not having achieved remission: Secondary | ICD-10-CM

## 2017-04-05 DIAGNOSIS — R7989 Other specified abnormal findings of blood chemistry: Secondary | ICD-10-CM

## 2017-04-05 DIAGNOSIS — R945 Abnormal results of liver function studies: Secondary | ICD-10-CM

## 2017-04-06 ENCOUNTER — Inpatient Hospital Stay: Payer: Medicare Other | Attending: Hematology and Oncology | Admitting: Hematology and Oncology

## 2017-04-06 ENCOUNTER — Inpatient Hospital Stay: Payer: Medicare Other

## 2017-04-06 ENCOUNTER — Encounter: Payer: Self-pay | Admitting: Hematology and Oncology

## 2017-04-06 DIAGNOSIS — D472 Monoclonal gammopathy: Secondary | ICD-10-CM | POA: Diagnosis not present

## 2017-04-06 DIAGNOSIS — T380X5A Adverse effect of glucocorticoids and synthetic analogues, initial encounter: Secondary | ICD-10-CM

## 2017-04-06 DIAGNOSIS — C91Z0 Other lymphoid leukemia not having achieved remission: Secondary | ICD-10-CM

## 2017-04-06 DIAGNOSIS — R7989 Other specified abnormal findings of blood chemistry: Secondary | ICD-10-CM

## 2017-04-06 DIAGNOSIS — R945 Abnormal results of liver function studies: Secondary | ICD-10-CM | POA: Insufficient documentation

## 2017-04-06 DIAGNOSIS — G720 Drug-induced myopathy: Secondary | ICD-10-CM | POA: Insufficient documentation

## 2017-04-06 DIAGNOSIS — G6181 Chronic inflammatory demyelinating polyneuritis: Secondary | ICD-10-CM | POA: Diagnosis not present

## 2017-04-06 DIAGNOSIS — Z809 Family history of malignant neoplasm, unspecified: Secondary | ICD-10-CM | POA: Insufficient documentation

## 2017-04-06 LAB — COMPREHENSIVE METABOLIC PANEL
ALBUMIN: 3.7 g/dL (ref 3.5–5.0)
ALK PHOS: 113 U/L (ref 40–150)
ALT: 68 U/L — AB (ref 0–55)
AST: 50 U/L — ABNORMAL HIGH (ref 5–34)
Anion gap: 9 (ref 3–11)
BUN: 14 mg/dL (ref 7–26)
CALCIUM: 9.2 mg/dL (ref 8.4–10.4)
CHLORIDE: 106 mmol/L (ref 98–109)
CO2: 25 mmol/L (ref 22–29)
CREATININE: 1.19 mg/dL (ref 0.70–1.30)
GFR calc Af Amer: >60 mL/min (ref >60)
GFR calc non Af Amer: 58 mL/min — ABNORMAL LOW (ref >60)
GLUCOSE: 127 mg/dL (ref 70–140)
Potassium: 4.1 mmol/L (ref 3.5–5.1)
SODIUM: 140 mmol/L (ref 136–145)
Total Bilirubin: 1.1 mg/dL (ref 0.2–1.2)
Total Protein: 6.7 g/dL (ref 6.4–8.3)

## 2017-04-06 LAB — CBC WITH DIFFERENTIAL/PLATELET
BASOS ABS: 0 10*3/uL (ref 0.0–0.1)
BASOS PCT: 1 %
EOS ABS: 0.1 10*3/uL (ref 0.0–0.5)
Eosinophils Relative: 2 %
HCT: 34.2 % — ABNORMAL LOW (ref 38.4–49.9)
HEMOGLOBIN: 11.4 g/dL — AB (ref 13.0–17.1)
LYMPHS ABS: 1.1 10*3/uL (ref 0.9–3.3)
Lymphocytes Relative: 31 %
MCH: 38.1 pg — ABNORMAL HIGH (ref 27.2–33.4)
MCHC: 33.3 g/dL (ref 32.0–36.0)
MCV: 114.4 fL — ABNORMAL HIGH (ref 79.3–98.0)
Monocytes Absolute: 0.3 10*3/uL (ref 0.1–0.9)
Monocytes Relative: 7 %
NEUTROS PCT: 59 %
Neutro Abs: 2.1 10*3/uL (ref 1.5–6.5)
Platelets: 276 10*3/uL (ref 140–400)
RBC: 2.99 MIL/uL — ABNORMAL LOW (ref 4.20–5.82)
RDW: 16.8 % — ABNORMAL HIGH (ref 11.0–14.6)
WBC: 3.6 10*3/uL — ABNORMAL LOW (ref 4.0–10.3)

## 2017-04-06 NOTE — Assessment & Plan Note (Signed)
He tolerated methotrexate well at 12.5 mg dose Except for his anemia, which very well could be related to methotrexate, lymphocytosis had resolved I recommend continue the same dose for now

## 2017-04-06 NOTE — Progress Notes (Signed)
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 can be associated with autoimmune disorder He tolerated reduced dose prednisone well Currently, he takes prednisone 5 mg on Mondays, Wednesdays and Fridays and to take 10 mg for the rest of the week He has no recent flare of CIDP recently I recommend we continue the same  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 and generalized deconditioning I continue to encourage him to increase exercise activity as tolerated  Family history of cancer His sister recently succumbed to ovarian cancer There is strong family history of malignancy His daughter is concerned about possibility of inheritable pattern of cancer and is wondering whether genetic counseling and genetic testing is warranted I recommend she contacts her cousin to see if the patient's sister has been screened  with diagnosis of ovarian cancer His daughter will get back to me and I will discuss this further with genetic counselor about the role of genetic testing   No orders of the defined types were placed in this encounter.   INTERVAL HISTORY: Please see below for problem oriented charting. 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 T-cell receptor rearrangement study is still pending      12/19/2015 -  Chemotherapy    The patient was started on 10 mg once a week methotrexate. The dose is increased to 12.5 mg starting 01/16/16        REVIEW OF SYSTEMS:   Constitutional: Denies fevers, chills or abnormal weight loss Eyes: Denies blurriness of vision Ears, nose, mouth, throat, and face: Denies mucositis or sore throat Respiratory: Denies cough, dyspnea or wheezes Cardiovascular: Denies palpitation, chest discomfort or lower extremity swelling Gastrointestinal:  Denies nausea, heartburn or change in bowel habits Skin: Denies abnormal skin rashes Lymphatics: Denies new lymphadenopathy or easy bruising Neurological:Denies numbness, tingling or new weaknesses Behavioral/Psych: Mood is stable, no new changes  All other systems were reviewed with the patient and are negative.  I have reviewed the past medical history, past surgical history, social history and family history with the patient and they are unchanged from previous note.  ALLERGIES:  is allergic to sulfa antibiotics and sulfonamide derivatives.  MEDICATIONS:  Current Outpatient  Medications  Medication Sig Dispense Refill  . acetaminophen (TYLENOL) 325 MG tablet Take 650 mg by mouth every 6 (six) hours as needed.    Marland Kitchen aspirin EC 81 MG tablet Take 162 mg by mouth daily.    Marland Kitchen CALCIUM CARB-CHOLECALCIFEROL PO Take 1,250 mg by mouth daily.    . Cyanocobalamin (VITAMIN B-12) 500 MCG SUBL Place 1 tablet under the tongue daily.     Marland Kitchen econazole nitrate 1 % cream Apply 1 application topically daily as needed.    . folic acid (FOLVITE) 440 MCG tablet Take 400 mcg by mouth daily.    Marland Kitchen gabapentin (NEURONTIN) 300 MG capsule Take 2 capsules (600 mg total) by mouth 2 (two) times daily.    Marland Kitchen gabapentin (NEURONTIN) 300 MG capsule TAKE 2 CAPSULES (600 MG TOTAL) BY MOUTH 2 (TWO) TIMES DAILY. 360 capsule 1  . ketoconazole (NIZORAL) 2 % shampoo Apply 1 application topically as needed.     . methotrexate (RHEUMATREX) 2.5 MG tablet TAKE 5 TABLETS (12.5 MG TOTAL) BY MOUTH ONCE A WEEK. CAUTION: CHEMOTHERAPY. PROTECT FROM LIGHT. 30 tablet 8  . Multiple Vitamin (MULTIVITAMIN) tablet Take 1 tablet by mouth daily.      . ondansetron (ZOFRAN) 8 MG tablet Take 1 tablet (8 mg total) by mouth every 8 (eight) hours as needed for nausea. 30 tablet 3  . predniSONE (DELTASONE) 5 MG tablet Take 5 mg by mouth on Mondays, Wednesdays and Fridays and take 10 mg daily for other days of the week 90 tablet 1  . timolol (TIMOPTIC) 0.5 % ophthalmic solution Place 1 drop into both eyes at bedtime. One drop in each eye at bedtime.    . triamcinolone (KENALOG) 0.025 % cream Apply 1 application topically as needed.     No current facility-administered medications for this visit.     PHYSICAL EXAMINATION: ECOG PERFORMANCE STATUS: 2 - Symptomatic, <50% confined to bed  Vitals:   04/06/17 1218  BP: 116/71  Pulse: 77  Resp: 18  Temp: (!) 97.5 F (36.4 C)  SpO2: 98%   Filed Weights   04/06/17 1218  Weight: 217 lb 8 oz (98.7 kg)    GENERAL:alert, no distress and comfortable.  He looks mildly cushingoid SKIN:  skin color, texture, turgor are normal, no rashes or significant lesions EYES: normal, Conjunctiva are pink and non-injected, sclera clear OROPHARYNX:no exudate, no erythema and lips, buccal mucosa, and tongue normal  NECK: supple, thyroid normal size, non-tender, without nodularity LYMPH:  no palpable lymphadenopathy in the cervical, axillary or inguinal LUNGS: clear to auscultation and percussion with normal breathing effort HEART: regular rate & rhythm and no murmurs and no lower extremity edema ABDOMEN:abdomen soft, non-tender and normal bowel sounds Musculoskeletal:no cyanosis of digits and no clubbing  NEURO: alert & oriented x 3 with fluent speech  LABORATORY DATA:  I have reviewed the data as listed    Component Value Date/Time   NA 140 04/06/2017 1137   NA 138 12/15/2016 1319   K 4.1 04/06/2017 1137   K 4.1 12/15/2016 1319   CL 106 04/06/2017 1137   CO2 25 04/06/2017 1137   CO2 24 12/15/2016 1319   GLUCOSE 127 04/06/2017 1137   GLUCOSE 123 12/15/2016 1319   BUN 14 04/06/2017 1137   BUN 13.7 12/15/2016 1319   CREATININE 1.19 04/06/2017 1137   CREATININE 1.2 12/15/2016 1319   CALCIUM 9.2 04/06/2017 1137   CALCIUM 8.8  12/15/2016 1319   PROT 6.7 04/06/2017 1137   PROT 6.5 12/15/2016 1319   PROT 6.2 12/15/2016 1319   ALBUMIN 3.7 04/06/2017 1137   ALBUMIN 3.5 12/15/2016 1319   AST 50 (H) 04/06/2017 1137   AST 66 (H) 12/15/2016 1319   ALT 68 (H) 04/06/2017 1137   ALT 90 (H) 12/15/2016 1319   ALKPHOS 113 04/06/2017 1137   ALKPHOS 116 12/15/2016 1319   BILITOT 1.1 04/06/2017 1137   BILITOT 1.13 12/15/2016 1319   GFRNONAA 58 (L) 04/06/2017 1137   GFRAA >60 04/06/2017 1137    No results found for: SPEP, UPEP  Lab Results  Component Value Date   WBC 3.6 (L) 04/06/2017   NEUTROABS 2.1 04/06/2017   HGB 11.4 (L) 04/06/2017   HCT 34.2 (L) 04/06/2017   MCV 114.4 (H) 04/06/2017   PLT 276 04/06/2017      Chemistry      Component Value Date/Time   NA 140 04/06/2017  1137   NA 138 12/15/2016 1319   K 4.1 04/06/2017 1137   K 4.1 12/15/2016 1319   CL 106 04/06/2017 1137   CO2 25 04/06/2017 1137   CO2 24 12/15/2016 1319   BUN 14 04/06/2017 1137   BUN 13.7 12/15/2016 1319   CREATININE 1.19 04/06/2017 1137   CREATININE 1.2 12/15/2016 1319      Component Value Date/Time   CALCIUM 9.2 04/06/2017 1137   CALCIUM 8.8 12/15/2016 1319   ALKPHOS 113 04/06/2017 1137   ALKPHOS 116 12/15/2016 1319   AST 50 (H) 04/06/2017 1137   AST 66 (H) 12/15/2016 1319   ALT 68 (H) 04/06/2017 1137   ALT 90 (H) 12/15/2016 1319   BILITOT 1.1 04/06/2017 1137   BILITOT 1.13 12/15/2016 1319     All questions were answered. The patient knows to call the clinic with any problems, questions or concerns. No barriers to learning was detected.  I spent 20 minutes counseling the patient face to face. The total time spent in the appointment was 30 minutes and more than 50% was on counseling and review of test results  Heath Lark, MD 04/06/2017 1:17 PM

## 2017-04-06 NOTE — Assessment & Plan Note (Signed)
The patient has MGUS. Bone marrow biopsy revealed 5-10% plasma cells, insignificant His last M protein was mildly elevated Repeat myeloma panel today is pending We will call the patient with test results If myeloma panel is stable, I will see him back in 6 months

## 2017-04-06 NOTE — Assessment & Plan Note (Signed)
T-cell LGL can be associated with autoimmune disorder He tolerated reduced dose prednisone well Currently, he takes prednisone 5 mg on Mondays, Wednesdays and Fridays and to take 10 mg for the rest of the week He has no recent flare of CIDP recently I recommend we continue the same 

## 2017-04-06 NOTE — Assessment & Plan Note (Signed)
His sister recently succumbed to ovarian cancer There is strong family history of malignancy His daughter is concerned about possibility of inheritable pattern of cancer and is wondering whether genetic counseling and genetic testing is warranted I recommend she contacts her cousin to see if the patient's sister has been screened with diagnosis of ovarian cancer His daughter will get back to me and I will discuss this further with genetic counselor about the role of genetic testing

## 2017-04-06 NOTE — Assessment & Plan Note (Signed)
The patient has evidence of steroid-induced myopathy and generalized deconditioning I continue to encourage him to increase exercise activity as tolerated

## 2017-04-06 NOTE — Assessment & Plan Note (Signed)
This is multifactorial, likely due to side effects of methotrexate, fatty liver disease (confirmed on CT from 2017) and also related to recent findings of hyperlipidemia I recommend close follow-up and dietary modification to treat his hyperlipidemia

## 2017-04-07 LAB — KAPPA/LAMBDA LIGHT CHAINS
Kappa free light chain: 29.5 mg/L — ABNORMAL HIGH (ref 3.3–19.4)
Kappa, lambda light chain ratio: 3.6 — ABNORMAL HIGH (ref 0.26–1.65)
Lambda free light chains: 8.2 mg/L (ref 5.7–26.3)

## 2017-04-09 LAB — MULTIPLE MYELOMA PANEL, SERUM
ALBUMIN/GLOB SERPL: 1.2 (ref 0.7–1.7)
Albumin SerPl Elph-Mcnc: 3.4 g/dL (ref 2.9–4.4)
Alpha 1: 0.2 g/dL (ref 0.0–0.4)
Alpha2 Glob SerPl Elph-Mcnc: 0.6 g/dL (ref 0.4–1.0)
B-Globulin SerPl Elph-Mcnc: 0.7 g/dL (ref 0.7–1.3)
Gamma Glob SerPl Elph-Mcnc: 1.5 g/dL (ref 0.4–1.8)
Globulin, Total: 3 g/dL (ref 2.2–3.9)
IGA: 35 mg/dL — AB (ref 61–437)
IGG (IMMUNOGLOBIN G), SERUM: 1466 mg/dL (ref 700–1600)
IGM (IMMUNOGLOBULIN M), SRM: 13 mg/dL — AB (ref 15–143)
M Protein SerPl Elph-Mcnc: 1.4 g/dL — ABNORMAL HIGH
Total Protein ELP: 6.4 g/dL (ref 6.0–8.5)

## 2017-04-13 ENCOUNTER — Telehealth: Payer: Self-pay | Admitting: Hematology and Oncology

## 2017-04-13 NOTE — Telephone Encounter (Signed)
I spoke with his daughter, Manuela Schwartz I reviewed the report from last week.  Myeloma panel is stable I recommend 6 months visit with blood work We also discussed genetic counseling referral.  She would reach out to me after she discussed this with her family and also the date and time for future visit

## 2017-05-15 ENCOUNTER — Other Ambulatory Visit: Payer: Self-pay | Admitting: Hematology and Oncology

## 2017-06-16 ENCOUNTER — Other Ambulatory Visit: Payer: Self-pay | Admitting: Family Medicine

## 2017-06-16 NOTE — Telephone Encounter (Signed)
Electronic refill request Last office visit 11/02/16 Last refill 12/21/16 #360/1 Next appointment scheduled is 10/25/17

## 2017-06-16 NOTE — Telephone Encounter (Signed)
Sent. Thanks.   

## 2017-07-18 ENCOUNTER — Other Ambulatory Visit: Payer: Self-pay | Admitting: Hematology and Oncology

## 2017-08-12 ENCOUNTER — Telehealth: Payer: Self-pay

## 2017-08-12 NOTE — Telephone Encounter (Signed)
Spoke with pt's spouse by phone regarding need for appts.  Curt Bears states to call their daughter, Manuela Schwartz, to schedule all appts.  Msg sent to schedulers.

## 2017-08-17 ENCOUNTER — Telehealth: Payer: Self-pay | Admitting: Hematology and Oncology

## 2017-08-17 NOTE — Telephone Encounter (Signed)
Scheduled apt per 7/11 sch message - left message with appt date and time and sent reminder letter in the mail

## 2017-08-25 ENCOUNTER — Telehealth: Payer: Self-pay | Admitting: Hematology and Oncology

## 2017-08-25 NOTE — Telephone Encounter (Signed)
Tried to reach regarding voicemail °

## 2017-08-27 ENCOUNTER — Telehealth: Payer: Self-pay | Admitting: Hematology and Oncology

## 2017-08-27 NOTE — Telephone Encounter (Signed)
Spoke to patients wife regarding upcoming sept appts per provider request.

## 2017-08-27 NOTE — Telephone Encounter (Signed)
Patient called to cancel she has question and she is out town

## 2017-08-30 ENCOUNTER — Inpatient Hospital Stay: Payer: Medicare Other

## 2017-08-30 ENCOUNTER — Inpatient Hospital Stay: Payer: Medicare Other | Admitting: Licensed Clinical Social Worker

## 2017-09-07 ENCOUNTER — Telehealth: Payer: Self-pay

## 2017-09-07 NOTE — Telephone Encounter (Signed)
Spoke with patient daughter per 8/6 phone que.  She stated that she had spoken with Melissa X. Concerning her father appointment date and was waiting on [place] approval for a particular day and phone call was dropped. How ever upion the patient daughter calling back and spoke with me the second time she asked for a different day and appointment was scheduled. Before  am time was decided on the phone hung up again. Recalled patient and spoke with the patient wife, I gave her the information and will be mailing a letter with a calender enclosed.

## 2017-10-08 ENCOUNTER — Ambulatory Visit: Payer: Medicare Other | Admitting: Hematology and Oncology

## 2017-10-08 ENCOUNTER — Other Ambulatory Visit: Payer: Medicare Other

## 2017-10-12 ENCOUNTER — Inpatient Hospital Stay: Payer: Medicare Other | Attending: Hematology and Oncology

## 2017-10-12 ENCOUNTER — Encounter: Payer: Self-pay | Admitting: Hematology and Oncology

## 2017-10-12 ENCOUNTER — Inpatient Hospital Stay (HOSPITAL_BASED_OUTPATIENT_CLINIC_OR_DEPARTMENT_OTHER): Payer: Medicare Other | Admitting: Hematology and Oncology

## 2017-10-12 ENCOUNTER — Telehealth: Payer: Self-pay | Admitting: Hematology and Oncology

## 2017-10-12 VITALS — BP 111/58 | HR 67 | Temp 98.0°F | Resp 17 | Ht 76.5 in | Wt 217.6 lb

## 2017-10-12 DIAGNOSIS — D649 Anemia, unspecified: Secondary | ICD-10-CM | POA: Insufficient documentation

## 2017-10-12 DIAGNOSIS — G6181 Chronic inflammatory demyelinating polyneuritis: Secondary | ICD-10-CM

## 2017-10-12 DIAGNOSIS — Z7982 Long term (current) use of aspirin: Secondary | ICD-10-CM | POA: Diagnosis not present

## 2017-10-12 DIAGNOSIS — C91Z0 Other lymphoid leukemia not having achieved remission: Secondary | ICD-10-CM

## 2017-10-12 DIAGNOSIS — G720 Drug-induced myopathy: Secondary | ICD-10-CM | POA: Insufficient documentation

## 2017-10-12 DIAGNOSIS — D539 Nutritional anemia, unspecified: Secondary | ICD-10-CM

## 2017-10-12 DIAGNOSIS — R7989 Other specified abnormal findings of blood chemistry: Secondary | ICD-10-CM

## 2017-10-12 DIAGNOSIS — D472 Monoclonal gammopathy: Secondary | ICD-10-CM | POA: Insufficient documentation

## 2017-10-12 DIAGNOSIS — R945 Abnormal results of liver function studies: Secondary | ICD-10-CM

## 2017-10-12 DIAGNOSIS — R296 Repeated falls: Secondary | ICD-10-CM

## 2017-10-12 DIAGNOSIS — T380X5A Adverse effect of glucocorticoids and synthetic analogues, initial encounter: Secondary | ICD-10-CM

## 2017-10-12 LAB — COMPREHENSIVE METABOLIC PANEL
ALK PHOS: 107 U/L (ref 38–126)
ALT: 57 U/L — ABNORMAL HIGH (ref 0–44)
AST: 38 U/L (ref 15–41)
Albumin: 3.5 g/dL (ref 3.5–5.0)
Anion gap: 8 (ref 5–15)
BILIRUBIN TOTAL: 1 mg/dL (ref 0.3–1.2)
BUN: 17 mg/dL (ref 8–23)
CALCIUM: 8.8 mg/dL — AB (ref 8.9–10.3)
CO2: 26 mmol/L (ref 22–32)
CREATININE: 1.12 mg/dL (ref 0.61–1.24)
Chloride: 108 mmol/L (ref 98–111)
GFR calc Af Amer: >60 mL/min (ref >60)
Glucose, Bld: 98 mg/dL (ref 70–99)
Potassium: 3.8 mmol/L (ref 3.5–5.1)
Sodium: 142 mmol/L (ref 135–145)
Total Protein: 6.2 g/dL — ABNORMAL LOW (ref 6.5–8.1)

## 2017-10-12 LAB — CBC WITH DIFFERENTIAL/PLATELET
BASOS PCT: 1 %
Basophils Absolute: 0 10*3/uL (ref 0.0–0.1)
EOS ABS: 0.2 10*3/uL (ref 0.0–0.5)
Eosinophils Relative: 5 %
HCT: 33.3 % — ABNORMAL LOW (ref 38.4–49.9)
Hemoglobin: 11.3 g/dL — ABNORMAL LOW (ref 13.0–17.1)
LYMPHS ABS: 2.2 10*3/uL (ref 0.9–3.3)
Lymphocytes Relative: 51 %
MCH: 38.1 pg — AB (ref 27.2–33.4)
MCHC: 33.9 g/dL (ref 32.0–36.0)
MCV: 112.6 fL — ABNORMAL HIGH (ref 79.3–98.0)
Monocytes Absolute: 0.5 10*3/uL (ref 0.1–0.9)
Monocytes Relative: 11 %
Neutro Abs: 1.4 10*3/uL — ABNORMAL LOW (ref 1.5–6.5)
Neutrophils Relative %: 32 %
Platelets: 255 10*3/uL (ref 140–400)
RBC: 2.96 MIL/uL — AB (ref 4.20–5.82)
RDW: 18.2 % — ABNORMAL HIGH (ref 11.0–14.6)
WBC: 4.2 10*3/uL (ref 4.0–10.3)

## 2017-10-12 NOTE — Assessment & Plan Note (Signed)
T-cell LGL can be associated with autoimmune disorder He tolerated reduced dose prednisone well Currently, he takes prednisone 5 mg on Mondays, Wednesdays and Fridays and to take 10 mg for the rest of the week He has no recent flare of CIDP recently I recommend we continue the same 

## 2017-10-12 NOTE — Telephone Encounter (Signed)
Gave avs and calendar ° °

## 2017-10-12 NOTE — Assessment & Plan Note (Addendum)
He tolerated methotrexate well at 12.5 mg dose Except for his anemia, which very well could be related to methotrexate. Lymphocytosis had resolved I recommend continue the same dose for now I will check serum vitamin B12 level in his next visit

## 2017-10-12 NOTE — Progress Notes (Signed)
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 Bone marrow biopsy revealed 5-10% plasma cells, insignificant [name] last M protein was mildly elevated but stable I plan to recheck myeloma panel in [name] next visit.  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 Vitamin B12    Standing Status:   Future    Standing Expiration Date:   11/16/2018  . Kappa/lambda light chains    Standing Status:   Future    Standing Expiration Date:   11/16/2018  . Multiple Myeloma Panel (SPEP&IFE w/QIG)    Standing Status:   Future    Standing Expiration Date:   11/16/2018  . Beta 2 microglobulin, serum    Standing Status:   Future    Standing Expiration Date:   11/16/2018  . CBC with Differential/Platelet    Standing Status:   Future    Standing Expiration Date:   11/16/2018  . Comprehensive metabolic panel    Standing Status:   Future   Standing Expiration Date:   11/16/2018  . Lactate dehydrogenase    Standing Status:   Future    Standing Expiration Date:   10/13/2018  . Ambulatory referral to Physical Therapy    Referral Priority:   Routine    Referral Type:   Physical Medicine    Referral Reason:   Specialty Services Required    Requested Specialty:   Physical Therapy    Number of Visits Requested:   1    INTERVAL HISTORY: Please see below for problem oriented charting. [name] returns with [name] daughter for further follow-up Since last time I saw him, [name] had 2 episodes of fall without significant injuries Both episodes of fall were attributed to muscular weakness [name] noted progressive weakness especially with [name] lower extremity [name] has not participated with any exercise or physical therapy since the last time we met due to [name] wife not able to drive [name] denies recent infection, fever or chills No recent progressive neuropathy [name] did not have problems getting [name] medications refilled  SUMMARY OF ONCOLOGIC HISTORY:   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 T-cell receptor rearrangement study is still pending    12/19/2015 -  Chemotherapy    The patient was started on 10 mg once a week methotrexate. The dose is increased to 12.5 mg starting 01/16/16      REVIEW OF SYSTEMS:   Constitutional: Denies fevers, chills or abnormal weight loss  Eyes: Denies blurriness of vision Ears, nose, mouth, throat, and face: Denies mucositis or sore throat Respiratory: Denies cough, dyspnea or wheezes Cardiovascular: Denies palpitation, chest discomfort or lower extremity swelling Gastrointestinal:  Denies nausea, heartburn or change in bowel habits Skin: Denies abnormal skin rashes Lymphatics: Denies new lymphadenopathy or easy bruising Behavioral/Psych: Mood is stable, no new changes  All other systems were reviewed with the patient and are  negative.  I have reviewed the past medical history, past surgical history, social history and family history with the patient and they are unchanged from previous note.  ALLERGIES:  is allergic to sulfa antibiotics and sulfonamide derivatives.  MEDICATIONS:  Current Outpatient Medications  Medication Sig Dispense Refill  . acetaminophen (TYLENOL) 325 MG tablet Take 650 mg by mouth every 6 (six) hours as needed.    Marland Kitchen aspirin EC 81 MG tablet Take 162 mg by mouth daily.    Marland Kitchen CALCIUM CARB-CHOLECALCIFEROL PO Take 1,250 mg by mouth daily.    . Cyanocobalamin (VITAMIN B-12) 500 MCG SUBL Place 1 tablet under the tongue daily.     Marland Kitchen econazole nitrate 1 % cream Apply 1 application topically daily as needed.    . folic acid (FOLVITE) 549 MCG tablet Take 400 mcg by mouth daily.    Marland Kitchen gabapentin (NEURONTIN) 300 MG capsule Take 2 capsules (600 mg total) by mouth 2 (two) times daily.    Marland Kitchen gabapentin (NEURONTIN) 300 MG capsule TAKE 2 CAPSULES (600 MG TOTAL) BY MOUTH 2 (TWO) TIMES DAILY. 360 capsule 1  . ketoconazole (NIZORAL) 2 % shampoo Apply 1 application topically as needed.     . methotrexate (RHEUMATREX) 2.5 MG tablet TAKE 5 TABLETS (12.5 MG TOTAL) BY MOUTH ONCE A WEEK. CAUTION: CHEMOTHERAPY. PROTECT FROM LIGHT. 30 tablet 8  . Multiple Vitamin (MULTIVITAMIN) tablet Take 1 tablet by mouth daily.      . ondansetron (ZOFRAN) 8 MG tablet Take 1 tablet (8 mg total) by mouth every 8 (eight) hours as needed for nausea. 30 tablet 3  . predniSONE (DELTASONE) 10 MG tablet TAKE 1 TABLET (10 MG TOTAL) BY MOUTH DAILY WITH BREAKFAST. 90 tablet 3  . predniSONE (DELTASONE) 5 MG tablet TAKE 1 TABLET BY MOUTH ON MONDAYS, WEDS, AND FRIDAYS, AND TAKE 2 TABS OTHER DAYS OF THE WEEK 90 tablet 1  . timolol (TIMOPTIC) 0.5 % ophthalmic solution Place 1 drop into both eyes at bedtime. One drop in each eye at bedtime.    . triamcinolone (KENALOG) 0.025 % cream Apply 1 application topically as needed.     No current  facility-administered medications for this visit.     PHYSICAL EXAMINATION: ECOG PERFORMANCE STATUS: 2 - Symptomatic, <50% confined to bed  Vitals:   10/12/17 0854  BP: (!) 111/58  Pulse: 67  Resp: 17  Temp: 98 F (36.7 C)  SpO2: 97%   Filed Weights   10/12/17 0854  Weight: 217 lb 9.6 oz (98.7 kg)    GENERAL:alert, no distress and comfortable SKIN: skin color, texture, turgor are normal, no rashes or significant lesions EYES: normal, Conjunctiva are pink and non-injected, sclera clear OROPHARYNX:no exudate, no erythema and lips, buccal mucosa, and tongue normal  NECK: supple, thyroid normal size, non-tender, without nodularity LYMPH:  no palpable lymphadenopathy in the cervical, axillary or inguinal LUNGS: clear to auscultation and percussion with normal breathing effort HEART: regular rate & rhythm and no murmurs, chronic bilateral lower extremity edema ABDOMEN:abdomen soft, non-tender and normal bowel sounds Musculoskeletal:no cyanosis of digits and no clubbing.  Proximal myopathy NEURO: alert & oriented x 3 with fluent speech, no focal motor/sensory deficits  LABORATORY DATA:  I have reviewed the data as listed    Component Value Date/Time   NA 142 10/12/2017 0812   NA 138 12/15/2016 1319   K 3.8 10/12/2017 0812   K 4.1 12/15/2016 1319   CL 108 10/12/2017 0812   CO2 26 10/12/2017 0812   CO2 24 12/15/2016 1319   GLUCOSE 98 10/12/2017 0812   GLUCOSE 123 12/15/2016 1319   BUN 17 10/12/2017 0812   BUN 13.7 12/15/2016 1319   CREATININE 1.12 10/12/2017 0812   CREATININE 1.2 12/15/2016 1319   CALCIUM 8.8 (L) 10/12/2017 0812   CALCIUM 8.8 12/15/2016 1319   PROT 6.2 (L) 10/12/2017 0812   PROT 6.5 12/15/2016 1319   PROT 6.2 12/15/2016 1319   ALBUMIN 3.5 10/12/2017 0812   ALBUMIN 3.5 12/15/2016 1319   AST 38 10/12/2017 0812   AST 66 (H) 12/15/2016 1319   ALT 57 (H) 10/12/2017 0812   ALT 90 (H) 12/15/2016 1319   ALKPHOS 107 10/12/2017 0812   ALKPHOS 116 12/15/2016  1319   BILITOT 1.0 10/12/2017 0812   BILITOT 1.13 12/15/2016 1319   GFRNONAA >60 10/12/2017 0812   GFRAA >60 10/12/2017 0812    No results found for: SPEP, UPEP  Lab Results  Component Value Date   WBC 4.2 10/12/2017   NEUTROABS 1.4 (L) 10/12/2017   HGB 11.3 (L) 10/12/2017   HCT 33.3 (L) 10/12/2017   MCV 112.6 (H) 10/12/2017   PLT 255 10/12/2017      Chemistry      Component Value Date/Time   NA 142 10/12/2017 0812   NA 138 12/15/2016 1319   K 3.8 10/12/2017 0812   K 4.1 12/15/2016 1319   CL 108 10/12/2017 0812   CO2 26 10/12/2017 0812   CO2 24 12/15/2016 1319   BUN 17 10/12/2017 0812   BUN 13.7 12/15/2016 1319   CREATININE 1.12 10/12/2017 0812   CREATININE 1.2 12/15/2016 1319      Component Value Date/Time   CALCIUM 8.8 (L) 10/12/2017 0812   CALCIUM 8.8 12/15/2016 1319   ALKPHOS 107 10/12/2017 0812   ALKPHOS 116 12/15/2016 1319   AST 38 10/12/2017 0812   AST 66 (H) 12/15/2016 1319   ALT 57 (H) 10/12/2017 0812   ALT 90 (H) 12/15/2016 1319   BILITOT 1.0 10/12/2017 0812   BILITOT 1.13 12/15/2016 1319       All questions were answered. The patient knows to call the clinic with any problems, questions or concerns. No barriers to learning was detected.  I spent 30 minutes counseling the patient face to face. The total time spent in the appointment was 40 minutes and more than 50% was on counseling and review of test results  Heath Lark, MD 10/12/2017 10:32 AM

## 2017-10-12 NOTE — Assessment & Plan Note (Signed)
He has progressive weakness with recurrent falls secondary to steroid myopathy I recommend neuro rehab referral for physical therapy and evaluation and he agreed to proceed

## 2017-10-12 NOTE — Assessment & Plan Note (Signed)
The patient has MGUS. Bone marrow biopsy revealed 5-10% plasma cells, insignificant His last M protein was mildly elevated but stable I plan to recheck myeloma panel in his next visit.

## 2017-10-20 ENCOUNTER — Ambulatory Visit (INDEPENDENT_AMBULATORY_CARE_PROVIDER_SITE_OTHER): Payer: Medicare Other

## 2017-10-20 ENCOUNTER — Ambulatory Visit: Payer: Medicare Other

## 2017-10-20 ENCOUNTER — Telehealth: Payer: Self-pay

## 2017-10-20 VITALS — BP 100/70 | HR 73 | Temp 98.1°F | Ht 76.5 in | Wt 220.5 lb

## 2017-10-20 DIAGNOSIS — R739 Hyperglycemia, unspecified: Secondary | ICD-10-CM | POA: Diagnosis not present

## 2017-10-20 DIAGNOSIS — T50905A Adverse effect of unspecified drugs, medicaments and biological substances, initial encounter: Secondary | ICD-10-CM | POA: Diagnosis not present

## 2017-10-20 DIAGNOSIS — E781 Pure hyperglyceridemia: Secondary | ICD-10-CM | POA: Diagnosis not present

## 2017-10-20 DIAGNOSIS — Z0000 Encounter for general adult medical examination without abnormal findings: Secondary | ICD-10-CM | POA: Diagnosis not present

## 2017-10-20 LAB — LIPID PANEL
Cholesterol: 108 mg/dL (ref 0–200)
HDL: 23.9 mg/dL — ABNORMAL LOW (ref >39.00)
LDL Cholesterol: 45 mg/dL (ref 0–99)
NONHDL: 84.11
Total CHOL/HDL Ratio: 5
Triglycerides: 198 mg/dL — ABNORMAL HIGH (ref 0.0–149.0)
VLDL: 39.6 mg/dL (ref 0.0–40.0)

## 2017-10-20 LAB — HEMOGLOBIN A1C: HEMOGLOBIN A1C: 6 % (ref 4.6–6.5)

## 2017-10-20 LAB — LDL CHOLESTEROL, DIRECT: Direct LDL: 63 mg/dL

## 2017-10-20 NOTE — Telephone Encounter (Signed)
This RN called neuro rehab center to f/u on referral.   They state they will contact pt today to schedule appt. I called the pt and told his wife to expect call from them.  Rehab number given to wife in the event they need to contact rehab center.

## 2017-10-20 NOTE — Patient Instructions (Signed)
[name] , Thank you for taking time to come for your Medicare Wellness Visit. I appreciate your ongoing commitment to your health goals. Please review the following plan we discussed and let me know if I can assist you in the future.   These are the goals we discussed: Goals    . Increase physical activity     Starting 10/21/2017 , I will continue to exercise at least 30 minutes daily.        This is a list of the screening recommended for you and due dates:  Health Maintenance  Topic Date Due  . Flu Shot  10/25/2017*  . Tetanus Vaccine  11/10/2020  . Pneumonia vaccines  Completed  *Topic was postponed. The date shown is not the original due date.   Preventive Care for Adults  A healthy lifestyle and preventive care can promote health and wellness. Preventive health guidelines for adults include the following key practices.  . A routine yearly physical is a good way to check with your health care provider about your health and preventive screening. It is a chance to share any concerns and updates on your health and to receive a thorough exam.  . Visit your dentist for a routine exam and preventive care every 6 months. Brush your teeth twice a day and floss once a day. Good oral hygiene prevents tooth decay and gum disease.  . The frequency of eye exams is based on your age, health, family medical history, use  of contact lenses, and other factors. Follow your health care provider's recommendations for frequency of eye exams.  . Eat a healthy diet. Foods like vegetables, fruits, whole grains, low-fat dairy products, and lean protein foods contain the nutrients you need without too many calories. Decrease your intake of foods high in solid fats, added sugars, and salt. Eat the right amount of calories for you. Get information about a proper diet from your health care provider, if necessary.  . Regular physical exercise is one of the most important things you can do for your health.  Most adults should get at least 150 minutes of moderate-intensity exercise (any activity that increases your heart rate and causes you to sweat) each week. In addition, most adults need muscle-strengthening exercises on 2 or more days a week.  Silver Sneakers may be a benefit available to you. To determine eligibility, you may visit the website: www.silversneakers.com or contact program at 431-445-4995 Mon-Fri between 8AM-8PM.   . Maintain a healthy weight. The body mass index (BMI) is a screening tool to identify possible weight problems. It provides an estimate of body fat based on height and weight. Your health care provider can find your BMI and can help you achieve or maintain a healthy weight.   For adults 20 years and older: ? A BMI below 18.5 is considered underweight. ? A BMI of 18.5 to 24.9 is normal. ? A BMI of 25 to 29.9 is considered overweight. ? A BMI of 30 and above is considered obese.   . Maintain normal blood lipids and cholesterol levels by exercising and minimizing your intake of saturated fat. Eat a balanced diet with plenty of fruit and vegetables. Blood tests for lipids and cholesterol should begin at age 21 and be repeated every 5 years. If your lipid or cholesterol levels are high, you are over 50, or you are at high risk for heart disease, you may need your cholesterol levels checked more frequently. Ongoing high lipid and cholesterol levels should  be treated with medicines if diet and exercise are not working.  . If you smoke, find out from your health care provider how to quit. If you do not use tobacco, please do not start.  . If you choose to drink alcohol, please do not consume more than 2 drinks per day. One drink is considered to be 12 ounces (355 mL) of beer, 5 ounces (148 mL) of wine, or 1.5 ounces (44 mL) of liquor.  . If you are 19-20 years old, ask your health care provider if you should take aspirin to prevent strokes.  . Use sunscreen. Apply sunscreen  liberally and repeatedly throughout the day. You should seek shade when your shadow is shorter than you. Protect yourself by wearing long sleeves, pants, a wide-brimmed hat, and sunglasses year round, whenever you are outdoors.  . Once a month, do a whole body skin exam, using a mirror to look at the skin on your back. Tell your health care provider of new moles, moles that have irregular borders, moles that are larger than a pencil eraser, or moles that have changed in shape or color.

## 2017-10-24 NOTE — Progress Notes (Signed)
Subjective:   [name] is a 77 y.o. male who presents for Medicare Annual/Subsequent preventive examination.  Review of Systems:  N/A Cardiac Risk Factors include: advanced age (>67men, >64 women);male gender;hypertension     Objective:    Vitals: BP 100/70 (BP Location: Right Arm, Patient Position: Sitting, Cuff Size: Normal)   Pulse 73   Temp 98.1 F (36.7 C) (Oral)   Ht 6' 4.5" (1.943 m) Comment: shoes  Wt 220 lb 8 oz (100 kg)   SpO2 93%   BMI 26.49 kg/m   Body mass index is 26.49 kg/m.  Advanced Directives 10/24/2017 10/14/2016 03/12/2016 02/12/2016 01/15/2016 12/18/2015 12/11/2015  Does Patient Have a Medical Advance Directive? No No No No No No No  Does patient want to make changes to medical advance directive? - Yes (MAU/Ambulatory/Procedural Areas - Information given) - - - - -  Would patient like information on creating a medical advance directive? No - Patient declined - - No - Patient declined No - Patient declined No - patient declined information No - patient declined information    Tobacco Social History   Tobacco Use  Smoking Status Never Smoker  Smokeless Tobacco Never Used     Counseling given: No   Clinical Intake:  Pre-visit preparation completed: Yes  Pain Score: 4 (head)     Nutritional Status: BMI 25 -29 Overweight Nutritional Risks: None Diabetes: No  How often do you need to have someone help you when you read instructions, pamphlets, or other written materials from your doctor or pharmacy?: 1 - Never What is the last grade level you completed in school?: Bachelor degree  Interpreter Needed?: No  Comments: pt lives with spouse Information entered by :: LPinson, LPN  Past Medical History:  Diagnosis Date  . Basal cell carcinoma of skin    skin cancer, resected by derm (Dr. Evorn Gong)  . Cholelithiasis 01/95   via abdominal ultrasound  . 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 Diverticulosis of colon  01/95  . History of kidney stones   . Hyperglycemia    Related to chronic steroid use  . Hypertension 08/07  . Nephrolithiasis 1993   X 2  . Polyneuropathy     Chronic inflammatory demylenating - Prednisone per [facility]  . Syncope 08/05/07   Presumed hypotension   Past Surgical History:  Procedure Laterality Date  . CHOLECYSTECTOMY  10/27/99   [facility] Done)  . DOPPLER ECHOCARDIOGRAPHY  08/01   T.R.; T.R.; P/R. borderline LVH  . ESOPHAGOGASTRODUODENOSCOPY  12/19/05   gastritis; [facility].  . HERNIA REPAIR  06/17/04   Laparoscopic, bilateral  . [place]  . LUMBAR DISC SURGERY  2000   L3/4 rupture  . MOHS SURGERY Left 01/09/2016   left side of nose  . nephrolithiasis     Family History  Problem Relation Age of Onset  . Hypertension Mother   . Heart disease Mother        MI  . Kidney disease Father        Kidney failure  . Parkinsonism Father   . Cancer Sister        Acute leukemia  . Arthritis Sister   . Cancer Sister 79       ovarian ca  . Cancer Brother        liver cancer  . Diabetes Neg Hx   . Stroke Neg Hx   . Prostate cancer Neg Hx   . Colon cancer Neg Hx  Social History   Socioeconomic History  . Marital status: Married    Spouse name: Not on file  . Number of children: 2  . Years of education: Not on file  . Highest education level: Not on file  Occupational History  . Occupation: Retired from [employer] ([place]) in 1998  . 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 Occupation: Coached  Social Needs  . Financial resource strain: Not on file  . Food insecurity:    Worry: Not on file    Inability: Not on file  . Transportation needs:    Medical: Not on file    Non-medical: Not on file  Tobacco Use  . Smoking status: Never Smoker  . Smokeless tobacco: Never Used  Substance and Sexual Activity  . Alcohol use: No  . Drug use: No  . Sexual activity: Never  Lifestyle  . Physical activity:    Days per week: Not on  file    Minutes per session: Not on file  . Stress: Not on file  Relationships  . Social connections:    Talks on phone: Not on file    Gets together: Not on file    Attends religious service: Not on file    Active member of club or organization: Not on file    Attends meetings of clubs or organizations: Not on file    Relationship status: Not on file  Other Topics Concern  . Not on file  Social History Narrative   Former Pharmacist, hospital, coach   Married 1963   2 kids    Outpatient Encounter Medications as of 10/20/2017  Medication Sig  . acetaminophen (TYLENOL) 325 MG tablet Take 650 mg by mouth every 6 (six) hours as needed.  Marland Kitchen aspirin EC 81 MG tablet Take 162 mg by mouth daily.  Marland Kitchen CALCIUM CARB-CHOLECALCIFEROL PO Take 1,250 mg by mouth daily.  . Cyanocobalamin (VITAMIN B-12) 500 MCG SUBL Place 1 tablet under the tongue daily.   Marland Kitchen econazole nitrate 1 % cream Apply 1 application topically daily as needed.  . folic acid (FOLVITE) 737 MCG tablet Take 400 mcg by mouth daily.  Marland Kitchen gabapentin (NEURONTIN) 300 MG capsule TAKE 2 CAPSULES (600 MG TOTAL) BY MOUTH 2 (TWO) TIMES DAILY.  Marland Kitchen ketoconazole (NIZORAL) 2 % shampoo Apply 1 application topically as needed.   . methotrexate (RHEUMATREX) 2.5 MG tablet TAKE 5 TABLETS (12.5 MG TOTAL) BY MOUTH ONCE A WEEK. CAUTION: CHEMOTHERAPY. PROTECT FROM LIGHT.  . Multiple Vitamin (MULTIVITAMIN) tablet Take 1 tablet by mouth daily.    . ondansetron (ZOFRAN) 8 MG tablet Take 1 tablet (8 mg total) by mouth every 8 (eight) hours as needed for nausea.  . predniSONE (DELTASONE) 5 MG tablet TAKE 1 TABLET BY MOUTH ON MONDAYS, WEDS, AND FRIDAYS, AND TAKE 2 TABS OTHER DAYS OF THE WEEK  . timolol (TIMOPTIC) 0.5 % ophthalmic solution Place 1 drop into both eyes at bedtime. One drop in each eye at bedtime.  . triamcinolone (KENALOG) 0.025 % cream Apply 1 application topically as needed.  . [DISCONTINUED] gabapentin (NEURONTIN) 300 MG capsule Take 2 capsules (600 mg total) by  mouth 2 (two) times daily.  . [DISCONTINUED] predniSONE (DELTASONE) 10 MG tablet TAKE 1 TABLET (10 MG TOTAL) BY MOUTH DAILY WITH BREAKFAST.   No facility-administered encounter medications on file as of 10/20/2017.     Activities of Daily Living In your present state of health, do you have any difficulty performing the following activities: 10/24/2017  Hearing? Y  Vision? Y  Difficulty concentrating or making decisions? N  Walking or climbing stairs? Y  Dressing or bathing? N  Doing errands, shopping? Y  Preparing Food and eating ? N  Using the Toilet? N  In the past six months, have you accidently leaked urine? N  Do you have problems with loss of bowel control? N  Managing your Medications? N  Managing your Finances? N  Housekeeping or managing your Housekeeping? N  Some recent data might be hidden    Patient Care Team: Tonia Ghent, MD as PCP - General (Family Medicine) Estill Cotta, MD as Referring Physician (Ophthalmology)   Assessment:   This is a routine wellness examination for Zhane.   Hearing Screening   125Hz  250Hz  500Hz  1000Hz  2000Hz  3000Hz  4000Hz  6000Hz  8000Hz   Right ear:   40 40 40  0    Left ear:   40 40 40  0    Vision Screening Comments: November 2018 with Dr. Thomasene Ripple   Exercise Activities and Dietary recommendations Current Exercise Habits: Home exercise routine, Type of exercise: Other - see comments(physical therapy exercises), Time (Minutes): 30, Frequency (Times/Week): 7, Weekly Exercise (Minutes/Week): 210, Intensity: Mild, Exercise limited by: orthopedic condition(s)  Goals    . Increase physical activity     Starting 10/21/2017 , I will continue to exercise at least 30 minutes daily.        Fall Risk Fall Risk  10/24/2017 10/14/2016 09/24/2015 08/16/2014 12/16/2012  Falls in the past year? Yes No Yes Yes Yes  Comment lost balance - Pt's knees buckled and he fell to knees on ground - -  Number falls in past yr: 2 or more - 1 1 2  or more    Injury with Fall? No - No No -  Risk Factor Category  High Fall Risk - - - -  Risk for fall due to : Impaired balance/gait;Impaired mobility;Impaired vision;History of fall(s) - Impaired balance/gait;Impaired mobility;Impaired vision - -  Follow up - - Falls evaluation completed - -    Depression Screen PHQ 2/9 Scores 10/24/2017 10/14/2016 09/24/2015 08/16/2014  PHQ - 2 Score 0 1 0 0  PHQ- 9 Score 0 1 - -    Cognitive Function MMSE - Mini Mental State Exam 10/24/2017 10/14/2016 09/24/2015  Orientation to time 5 5 5   Orientation to Place 5 5 5   Registration 3 3 3   Attention/ Calculation 0 0 0  Recall 3 3 3   Language- name 2 objects 0 0 0  Language- repeat 1 1 1   Language- follow 3 step command 3 3 3   Language- read & follow direction 0 0 0  Write a sentence 0 0 0  Copy design 0 0 0  Total score 20 20 20    PLEASE NOTE: A Mini-Cog screen was completed. Maximum score is 20. A value of 0 denotes this part of Folstein MMSE was not completed or the patient failed this part of the Mini-Cog screening.   Mini-Cog Screening Orientation to Time - Max 5 pts Orientation to Place - Max 5 pts Registration - Max 3 pts Recall - Max 3 pts Language Repeat - Max 1 pts Language Follow 3 Step Command - Max 3 pts       Immunization History  Administered Date(s) Administered  . Influenza Split 11/11/2010, 12/17/2011  . Influenza Whole 12/10/2006, 11/07/2007, 11/22/2008, 11/04/2009  . Influenza,inj,Quad PF,6+ Mos 12/16/2012, 11/15/2015, 11/02/2016  . Influenza-Unspecified 12/01/2013, 12/03/2014  . Pneumococcal Conjugate-13 09/24/2015  . Pneumococcal Polysaccharide-23  04/08/2006  . Td 11/28/2001  . Tdap 11/11/2010    Screening Tests Health Maintenance  Topic Date Due  . INFLUENZA VACCINE  10/25/2017 (Originally 09/02/2017)  . TETANUS/TDAP  11/10/2020  . PNA vac Low Risk Adult  Completed     Plan:     I have personally reviewed, addressed, and noted the following in the patient's chart:   A. Medical and social history B. Use of alcohol, tobacco or illicit drugs  C. Current medications and supplements D. Functional ability and status E.  Nutritional status F.  Physical activity G. Advance directives H. List of other physicians I.  Hospitalizations, surgeries, and ER visits in previous 12 months J.  [place] to include hearing, vision, cognitive, depression L. Referrals and appointments - none  In addition, I have reviewed and discussed with patient certain preventive protocols, quality metrics, and best practice recommendations. A written personalized care plan for preventive services as well as general preventive health recommendations were provided to patient.  See attached scanned questionnaire for additional information.   Signed,   Lindell Noe, MHA, BS, LPN Health Coach

## 2017-10-24 NOTE — Progress Notes (Signed)
PCP notes:   Health maintenance:  Flu vaccine - pt desires to have vaccine administered at CPE appt  Abnormal screenings:   Hearing - failed  Hearing Screening   125Hz  250Hz  500Hz  1000Hz  2000Hz  3000Hz  4000Hz  6000Hz  8000Hz   Right ear:   40 40 40  0    Left ear:   40 40 40  0     Fall risk - hx of multiple falls Fall Risk  10/24/2017 10/14/2016 09/24/2015 08/16/2014 12/16/2012  Falls in the past year? Yes No Yes Yes Yes  Comment lost balance - Pt's knees buckled and he fell to knees on ground - -  Number falls in past yr: 2 or more - 1 1 2  or more  Injury with Fall? No - No No -  Risk Factor Category  High Fall Risk - - - -  Risk for fall due to : Impaired balance/gait;Impaired mobility;Impaired vision;History of fall(s) - Impaired balance/gait;Impaired mobility;Impaired vision - -  Follow up - - Falls evaluation completed - -   Patient concerns:   Continued concern with headache and vision changes related to shingles.  Nurse concerns:  None  Next PCP appt:   10/25/17 @ 0945

## 2017-10-25 ENCOUNTER — Ambulatory Visit (INDEPENDENT_AMBULATORY_CARE_PROVIDER_SITE_OTHER): Payer: Medicare Other | Admitting: Family Medicine

## 2017-10-25 ENCOUNTER — Encounter: Payer: Self-pay | Admitting: Family Medicine

## 2017-10-25 VITALS — BP 108/60 | HR 84 | Temp 98.3°F | Ht 76.5 in | Wt 216.2 lb

## 2017-10-25 DIAGNOSIS — D472 Monoclonal gammopathy: Secondary | ICD-10-CM | POA: Diagnosis not present

## 2017-10-25 DIAGNOSIS — Z7189 Other specified counseling: Secondary | ICD-10-CM

## 2017-10-25 DIAGNOSIS — Z23 Encounter for immunization: Secondary | ICD-10-CM

## 2017-10-25 DIAGNOSIS — Z0000 Encounter for general adult medical examination without abnormal findings: Secondary | ICD-10-CM

## 2017-10-25 DIAGNOSIS — B0229 Other postherpetic nervous system involvement: Secondary | ICD-10-CM | POA: Diagnosis not present

## 2017-10-25 DIAGNOSIS — G6181 Chronic inflammatory demyelinating polyneuritis: Secondary | ICD-10-CM | POA: Diagnosis not present

## 2017-10-25 DIAGNOSIS — C91Z0 Other lymphoid leukemia not having achieved remission: Secondary | ICD-10-CM

## 2017-10-25 MED ORDER — GABAPENTIN 300 MG PO CAPS: 600.0000 mg | ORAL_CAPSULE | Freq: Two times a day (BID) | ORAL | 3 refills | Status: DC

## 2017-10-25 NOTE — Patient Instructions (Signed)
Don't change your meds for now. Update me as needed.  See what you can to with physical therapy.  Take care.  Glad to see you.  Flu shot today.

## 2017-10-25 NOTE — Progress Notes (Signed)
Fall risk d/w pt.  Using walker.  Cautions d/w pt.  He has a wheelchair to use as needed.  He was able to get some sporting events to see his grandkids play, with accommodations.  No injury with falls but he did fall off tractor.  He had a phone with him.  He wants to be outside as much as possible.  We talked about trying to make his activity as safe as possible.  I want him to check his equipment for automatic shut offs.  He isn't driving otherwise.  D/w pt about rehab exercises.  He has f/u pending and he'll give it a try.  I think that is reasonable.    Shingles.  He is putting up with pain.  He wanted to limit his meds.  3/10 at best, up to 5/10 pain o/w. Still on gabapentin at baseline, 2 tabs BID.  Cautions d/w pt.  He is having less severe pains compared to initial sx but he isn't pain free.    Labs discussed with patient.  Not diabetic by A1c.  Triglycerides are slightly above goal but overall improved compared to previous.  Flu d/w pt.   Wife designated if patient were incapacitated. Defer colonoscopy for now.  D/w pt, given other issues.  Declined hearing aids.  D/w pt.    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 He tolerated methotrexate well at 12.5 mg dose except for his anemia, which very well could be related to methotrexate.  MGUS (monoclonal gammopathy of unknown significance) -discussed with patient by previous assessment and plan per hematology. The patient has MGUS. Bone marrow biopsy revealed 5-10% plasma cells, insignificant  PMH and SH reviewed  ROS: Per HPI unless specifically indicated in ROS section   Meds, vitals, and allergies reviewed.   GEN: nad, alert and oriented HEENT: mucous membranes moist NECK: supple w/o LA CV: rrr. PULM: ctab, no inc wob ABD: soft, +bs EXT: no edema SKIN: no acute rash Walking with walker with diffuse muscle weakness compared to previous typical healthy  baseline

## 2017-10-25 NOTE — Progress Notes (Signed)
   Subjective:    Patient ID: [name], male    DOB: 10/22/1940, 77 y.o.   MRN: [id number]  HPI I reviewed health advisor's note, was available for consultation, and agree with documentation and plan.    Review of Systems     Objective:   Physical Exam         Assessment & Plan:

## 2017-10-26 DIAGNOSIS — Z0000 Encounter for general adult medical examination without abnormal findings: Secondary | ICD-10-CM | POA: Insufficient documentation

## 2017-10-26 NOTE — Assessment & Plan Note (Signed)
Per hematology.  Previous work-up discussed at length.  No change in meds at this point.

## 2017-10-26 NOTE — Assessment & Plan Note (Signed)
Per outside clinic.  Discussed with him about physical therapy/rehab exercises.  This is reasonable to try in the meantime, just to see if he has any benefit.  No change in medicines at this point.  Discussed with patient about activity modification to try to increase his safety.  Specifically discussed working in the yard, Social research officer, government.  Discussed fall cautions.  Continue using walker.  Update me as needed.  He agrees.

## 2017-10-26 NOTE — Assessment & Plan Note (Signed)
He can tolerate pain as is.  He is not having the severe paroxysms of pain that he previously had.  Continue gabapentin for now.  We are trying to balance the benefit of pain reduction with the potential balance issues that could be exacerbated by gabapentin.  He agrees.  He thought it was reasonable to continue the current dose and I support that.  He will update me as needed.

## 2017-10-26 NOTE — Assessment & Plan Note (Signed)
Wife designated if patient were incapacitated.  

## 2017-10-26 NOTE — Assessment & Plan Note (Addendum)
Per hematology.  Pathophysiology discussed with patient.  Previous work-up discussed at length.  No change in meds at this point. >25 minutes spent in face to face time with patient, >50% spent in counselling or coordination of care.

## 2017-10-26 NOTE — Assessment & Plan Note (Signed)
Flu d/w pt.   Wife designated if patient were incapacitated. Defer colonoscopy for now.  D/w pt, given other issues.  Declined hearing aids.  D/w pt.

## 2017-10-29 ENCOUNTER — Ambulatory Visit: Payer: Medicare Other | Attending: Hematology and Oncology | Admitting: Physical Therapy

## 2017-10-29 ENCOUNTER — Encounter: Payer: Self-pay | Admitting: Physical Therapy

## 2017-10-29 ENCOUNTER — Other Ambulatory Visit: Payer: Self-pay

## 2017-10-29 DIAGNOSIS — R29818 Other symptoms and signs involving the nervous system: Secondary | ICD-10-CM | POA: Diagnosis present

## 2017-10-29 DIAGNOSIS — M6281 Muscle weakness (generalized): Secondary | ICD-10-CM | POA: Insufficient documentation

## 2017-10-29 DIAGNOSIS — R2689 Other abnormalities of gait and mobility: Secondary | ICD-10-CM

## 2017-10-29 DIAGNOSIS — Z9181 History of falling: Secondary | ICD-10-CM | POA: Insufficient documentation

## 2017-10-29 DIAGNOSIS — R2681 Unsteadiness on feet: Secondary | ICD-10-CM | POA: Insufficient documentation

## 2017-10-29 NOTE — Therapy (Signed)
[facility] [address] [place], [place], [address] Phone: [phone]   Fax:  [phone]  Physical Therapy Evaluation  Patient Details  Name: [name] MRN: [id number] Date of Birth: [birth year] Referring Provider (PT): Dr. Heath Lark   Encounter Date: 10/29/2017  PT End of Session - 10/29/17 1354    Visit Number  1    Number of Visits  17    Date for PT Re-Evaluation  12/24/17    Authorization Type  [employer] Medicare    PT Start Time  1146    PT Stop Time  1232    PT Time Calculation (min)  46 min    Activity Tolerance  Patient tolerated treatment well    Behavior During Therapy  [facility] for tasks assessed/performed       Past Medical History:  Diagnosis Date  . Basal cell carcinoma of skin    skin cancer, resected by derm (Dr. Evorn Gong)  . Cholelithiasis 01/95   via abdominal ultrasound  . 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 Diverticulosis of colon 01/95  . History of kidney stones   . Hyperglycemia    Related to chronic steroid use  . Hypertension 08/07  . Nephrolithiasis 1993   X 2  . Polyneuropathy     Chronic inflammatory demylenating - Prednisone per [facility]  . Syncope 08/05/07   Presumed hypotension    Past Surgical History:  Procedure Laterality Date  . CHOLECYSTECTOMY  10/27/99   Hassell Done)  . DOPPLER ECHOCARDIOGRAPHY  08/01   T.R.; T.R.; P/R. borderline LVH  . ESOPHAGOGASTRODUODENOSCOPY  12/19/05   gastritis; [facility].  . HERNIA REPAIR  06/17/04   Laparoscopic, bilateral  . [place]  . LUMBAR DISC SURGERY  2000   L3/4 rupture  . MOHS SURGERY Left 01/09/2016   left side of nose  . nephrolithiasis      There were no vitals filed for this visit.   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 Paitent and family reporting primary concerns of losing strength and balance with 2 falls in the past 6 months. Patient reports he is independent with ADLs. Recent shingles with resultant chronic headache of R side. Reports no feeling in B feet and reduced sensation at B LE - unaware of feet in space. Uses walker primarily but does have a motorized scooter to watch granddaughters sporting events.     Pertinent History  Large granular lymphocytic leukemia, CIDP (chronic inflammatory demyelinating polyneuropathy), Steroid-induced myopathy, HTN, frequent falls, MGUS (monoclonal gammopathy of unknown significance)    Limitations  Standing;Walking    Patient Stated Goals  "I want to be active, walk, and move around"    Currently in Pain?  Yes    Pain Score  3     Pain Location  Head    Pain Orientation  Right    Pain Descriptors / Indicators  Headache    Pain Type  Chronic pain         [facility] PT Assessment - 10/29/17 1157      Assessment   Medical Diagnosis  large granular lymphocytic leukemia, frequent falls    Referring Provider (PT)  Dr. Heath Lark    Prior Therapy  yes - [facility]PT      Precautions   Precautions  Fall  Restrictions   Weight Bearing Restrictions  No      Balance Screen   Has the patient fallen in the past 6 months  Yes    How many times?  2   reports no injury   Has the patient had a decrease in activity level because of a fear of falling?   No    Is the patient reluctant to leave their home because of a fear of falling?   No      Home Environment   Living Environment  Private residence    Available Help at Discharge  Family    Type of [place] to enter;Other (comment)   4.5" steps with 2 landings   Entrance Stairs-Number of Steps  6    Entrance Stairs-Rails  Left   + use of cane   Home Layout  One level    Home Equipment  Walker - 2 wheels;Shower seat;Grab bars - tub/shower;Bedside commode   BSC over  toilet   Additional Comments  walk-in shower      Prior Function   Level of Independence  Independent with basic ADLs;Independent with household mobility with device    Vocation  Retired    Leisure  enjoys being outside and on his [employer], [employer]   Overall Cognitive Status  Within Functional Limits for tasks assessed      Sensation   Light Touch  Impaired by gross assessment    Proprioception  Impaired by gross assessment    Additional Comments  reports numbness at B feet as well as reduced spatial awareness and unaware of foot placement with gait      Posture/Postural Control   Posture/Postural Control  Postural limitations    Postural Limitations  Rounded Shoulders;Forward head;Flexed trunk      Flexibility   Soft Tissue Assessment /Muscle Length  yes    Hamstrings  B moderate tightness      Transfers   Transfers  Sit to Stand;Stand to Sit    Sit to Stand  5: Supervision;With upper extremity assist;With armrests;From chair/3-in-1    Sit to Stand Details (indicate cue type and reason)  reqires use of UE support as well as LE on chair    Five time sit to stand comments   1.02 minutes with use of UE; easily becomes SOB    Stand to Sit  5: Supervision;With upper extremity assist;With armrests;To chair/3-in-1    Stand to Sit Details  requires use of LE on chair      Ambulation/Gait   Ambulation/Gait  Yes    Ambulation/Gait Assistance  5: Supervision    Ambulation/Gait Assistance Details  ambulating with RW - forward flexed posture with difficulty maintaining upright; unaware of foot placement with gait; prefers full knee extension for stability    Ambulation Distance (Feet)  75 Feet    Assistive device  Rolling walker    Gait Pattern  Step-through pattern;Decreased stride length;Decreased hip/knee flexion - right;Decreased hip/knee flexion - left;Decreased dorsiflexion - right;Decreased dorsiflexion - left;Trunk flexed;Ataxic    Ambulation Surface  Level;Indoor    Gait  velocity  2.0 ft/sec      Balance   Balance Assessed  Yes      Standardized Balance Assessment   Standardized Balance Assessment  Timed Up and Go Test;Five Times Sit to Stand      Timed Up and Go Test   TUG  Normal TUG  Normal TUG (seconds)  42.79   with walker               Objective measurements completed on examination: See above findings.              PT Education - 10/29/17 1354    Education Details  exam findings, fall risk. POC, justification for skilled treatment    Person(s) Educated  Patient;Spouse;Child(ren)    Methods  Explanation    Comprehension  Verbalized understanding       PT Short Term Goals - 10/29/17 1402      PT SHORT TERM GOAL #1   Title  patient to be independent with initial HEP    Time  4    Period  Weeks    Status  New    Target Date  11/26/17      PT SHORT TERM GOAL #2   Title  patient to improve TUG by >/= 6 seconds demonstrating improved functional mobility    Baseline  9/27: 42.79 sec    Time  4    Period  Weeks    Status  New    Target Date  11/26/17      PT SHORT TERM GOAL #3   Title  patient to demonstrate upright posturing with gait >/= 50% of the time    Time  4    Period  Weeks    Status  New    Target Date  11/26/17        PT Long Term Goals - 10/29/17 1404      PT LONG TERM GOAL #1   Title  patient to be independent with advanced HEP    Time  8    Period  Weeks    Status  New    Target Date  12/24/17      PT LONG TERM GOAL #2   Title  patient to improve gait speed to >/= 2.82 ft/sec demonstrating improved functional mobility    Baseline  9/27: 2.0 ft/sec    Time  8    Period  Weeks    Status  New    Target Date  12/24/17      PT LONG TERM GOAL #3   Title  patient to improve 5x sit to stand by >/= 10 seconds    Time  8    Period  Weeks    Status  New    Target Date  12/24/17      PT LONG TERM GOAL #4   Title  patient to improve TUG by >/= 12 seconds for improved balance and  mobility    Baseline  9/27: 42.79    Time  8    Period  Weeks    Status  New    Target Date  12/24/17      PT LONG TERM GOAL #5   Title  patient to report available community wellness programs available to him    Time  8    Period  Weeks    Status  New    Target Date  12/24/17             Plan - 10/29/17 1355    Clinical Impression Statement  Mr. Apodaca is a very pleasant 77 y/o male presenting to [facility] today with wife and daughter, regarding primary complaints of generalized weakness and a history of falls. Patient with a complex medical history contributing to current functional status. Patient today scoring in high fall  risk categories with 5x sit to stand and TUG as well as demonstrating gait speed as a limited community ambulator. Patient to benefit from skilled PT services to address functional mobility, strength, balance, and gait.     History and Personal Factors relevant to plan of care:  Large granular lymphocytic leukemia, CIDP (chronic inflammatory demyelinating polyneuropathy), Steroid-induced myopathy, HTN, frequent falls, MGUS (monoclonal gammopathy of unknown significance)    Clinical Presentation  Evolving    Clinical Presentation due to:  Large granular lymphocytic leukemia, CIDP (chronic inflammatory demyelinating polyneuropathy), Steroid-induced myopathy, HTN, frequent falls, MGUS (monoclonal gammopathy of unknown significance); unable to drive, requires use of AD, history of falls    Clinical Decision Making  Moderate    Rehab Potential  Good    PT Frequency  2x / week    PT Duration  8 weeks    PT Treatment/Interventions  ADLs/Self Care Home Management;Gait training;Stair training;Functional mobility training;Therapeutic activities;Therapeutic exercise;Balance training;Patient/family education;Neuromuscular re-education;Manual techniques;Taping    PT Next Visit Plan  initiate HEP, focus on functional mobility and strength    Consulted and Agree with Plan of  Care  Patient       Patient will benefit from skilled therapeutic intervention in order to improve the following deficits and impairments:  Abnormal gait, Impaired sensation, Decreased mobility, Decreased coordination, Decreased activity tolerance, Decreased strength, Decreased balance, Difficulty walking  Visit Diagnosis: Unsteadiness on feet  Other abnormalities of gait and mobility  Other symptoms and signs involving the nervous system  Muscle weakness (generalized)  History of falling     Problem List Patient Active Problem List   Diagnosis Date Noted  . Healthcare maintenance 10/26/2017  . Family history of cancer 04/06/2017  . Stasis dermatitis of both legs 11/03/2016  . Dysuria 09/22/2016  . Atypical chest pain 05/05/2016  . Nausea without vomiting 03/12/2016  . Basal cell carcinoma of skin 01/15/2016  . Postherpetic neuralgia 01/15/2016  . 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[place]) 12/18/2015  . Physical deconditioning 12/18/2015  . Kidney stones 12/06/2015  . 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 Macrocytic anemia 11/27/2015  . Lymphoproliferative disorder ([place]) 11/27/2015  . Dyspnea 11/27/2015  . 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 Frequent falls 11/27/2015  . Deficiency anemia 11/17/2015  . Cracking skin 10/02/2015  . Advance care planning 08/17/2014  . Shingles 07/18/2014  . LFT elevation 08/13/2013  . Edema 08/13/2013  . 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 Medicare annual wellness visit, subsequent 12/18/2011  . CIDP (chronic inflammatory demyelinating polyneuropathy) ([place]) 05/18/2011  . 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 Long term systemic steroid user 05/18/2011  . Hyperglycemia, drug-induced 06/05/2010  . VASOVAGAL SYNCOPE 08/05/2007  . Essential hypertension 10/06/2006  . SYSTOLIC MURMUR 67/01/4579    Lanney Gins, PT, DPT Supplemental Physical Therapist 10/29/17 2:07 PM Pager: [phone] Office: [facility] [address] [place], [place], [address] Phone: [phone]   Fax:  [phone]  Name: [name] MRN: [id number] Date of Birth: 09-29-1940

## 2017-11-05 ENCOUNTER — Ambulatory Visit: Payer: Medicare Other | Attending: Hematology and Oncology | Admitting: Physical Therapy

## 2017-11-05 ENCOUNTER — Encounter: Payer: Self-pay | Admitting: Physical Therapy

## 2017-11-05 DIAGNOSIS — Z9181 History of falling: Secondary | ICD-10-CM

## 2017-11-05 DIAGNOSIS — M6281 Muscle weakness (generalized): Secondary | ICD-10-CM

## 2017-11-05 DIAGNOSIS — R29818 Other symptoms and signs involving the nervous system: Secondary | ICD-10-CM

## 2017-11-05 DIAGNOSIS — R2689 Other abnormalities of gait and mobility: Secondary | ICD-10-CM | POA: Diagnosis present

## 2017-11-05 DIAGNOSIS — R2681 Unsteadiness on feet: Secondary | ICD-10-CM | POA: Diagnosis not present

## 2017-11-05 NOTE — Therapy (Signed)
[facility] [address] [place], [place], [address] Phone: [phone]   Fax:  [phone]  Physical Therapy Treatment  Patient Details  Name: [name] MRN: [id number] Date of Birth: 04-26-1940 Referring Provider (PT): Dr. Heath Lark   Encounter Date: 11/05/2017  PT End of Session - 11/05/17 1054    Visit Number  2    Number of Visits  17    Date for PT Re-Evaluation  12/24/17    Authorization Type  UHC Medicare    PT Start Time  0935    PT Stop Time  1017    PT Time Calculation (min)  42 min    Activity Tolerance  Patient tolerated treatment well    Behavior During Therapy  [facility] for tasks assessed/performed       Past Medical History:  Diagnosis Date  . Basal cell carcinoma of skin    skin cancer, resected by derm (Dr. Evorn Gong)  . Cholelithiasis 01/95   via abdominal ultrasound  . 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 Diverticulosis of colon 01/95  . History of kidney stones   . Hyperglycemia    Related to chronic steroid use  . Hypertension 08/07  . Nephrolithiasis 1993   X 2  . Polyneuropathy     Chronic inflammatory demylenating - Prednisone per [facility]  . Syncope 08/05/07   Presumed hypotension    Past Surgical History:  Procedure Laterality Date  . CHOLECYSTECTOMY  10/27/99   Hassell Done)  . DOPPLER ECHOCARDIOGRAPHY  08/01   T.R.; T.R.; P/R. borderline LVH  . ESOPHAGOGASTRODUODENOSCOPY  12/19/05   gastritis; H.H.  . HERNIA REPAIR  06/17/04   Laparoscopic, bilateral  . [place]  . LUMBAR DISC SURGERY  2000   L3/4 rupture  . MOHS SURGERY Left 01/09/2016   left side of nose  . nephrolithiasis      There were no vitals filed for this visit.  Subjective Assessment - 11/05/17 1054    Subjective  doing well, no new complaints, daughter present for session    Patient is accompained by:  Family member   daughter   Pertinent History  Large  granular lymphocytic leukemia, CIDP (chronic inflammatory demyelinating polyneuropathy), Steroid-induced myopathy, HTN, frequent falls, MGUS (monoclonal gammopathy of unknown significance)    Patient Stated Goals  "I want to be active, walk, and move around"    Currently in Pain?  No/denies    Pain Score  0-No pain                       [facility] Adult PT Treatment/Exercise - 11/05/17 0001      Ambulation/Gait   Ambulation/Gait  Yes    Ambulation/Gait Assistance  5: Supervision    Ambulation/Gait Assistance Details  brought in taller walker - still with excessive forward flexion which may be due to tight hip flexors; reduced proprioception of B LE    Ambulation Distance (Feet)  100 Feet    Assistive device  Rolling walker    Gait Pattern  Step-through pattern;Decreased stride length;Decreased hip/knee flexion - right;Decreased hip/knee flexion - left;Decreased dorsiflexion - right;Decreased dorsiflexion - left;Trunk flexed;Ataxic    Ambulation Surface  Level;Indoor      Exercises   Exercises  Knee/Hip      Knee/Hip Exercises: Stretches   Passive Hamstring Stretch  Right;Left;1 rep;30 seconds    Passive Hamstring Stretch Limitations  seated forward bend      Knee/Hip  Exercises: Seated   Sit to Sand  10 reps;with UE support   chair + 1 AirEx     Knee/Hip Exercises: Supine   Bridges  Strengthening;Both;10 reps    Bridges Limitations  limited height    Straight Leg Raises  Strengthening;Both;10 reps    Straight Leg Raises Limitations  limited height    Other Supine Knee/Hip Exercises  supine clam focusing on control of motion x 10 each LE    Other Supine Knee/Hip Exercises  bent knee raise to chest x 10 each LE       Knee/Hip Exercises: Sidelying   Clams  B x 10               PT Short Term Goals - 10/29/17 1402      PT SHORT TERM GOAL #1   Title  patient to be independent with initial HEP    Time  4    Period  Weeks    Status  New    Target Date   11/26/17      PT SHORT TERM GOAL #2   Title  patient to improve TUG by >/= 6 seconds demonstrating improved functional mobility    Baseline  9/27: 42.79 sec    Time  4    Period  Weeks    Status  New    Target Date  11/26/17      PT SHORT TERM GOAL #3   Title  patient to demonstrate upright posturing with gait >/= 50% of the time    Time  4    Period  Weeks    Status  New    Target Date  11/26/17        PT Long Term Goals - 10/29/17 1404      PT LONG TERM GOAL #1   Title  patient to be independent with advanced HEP    Time  8    Period  Weeks    Status  New    Target Date  12/24/17      PT LONG TERM GOAL #2   Title  patient to improve gait speed to >/= 2.82 ft/sec demonstrating improved functional mobility    Baseline  9/27: 2.0 ft/sec    Time  8    Period  Weeks    Status  New    Target Date  12/24/17      PT LONG TERM GOAL #3   Title  patient to improve 5x sit to stand by >/= 10 seconds    Time  8    Period  Weeks    Status  New    Target Date  12/24/17      PT LONG TERM GOAL #4   Title  patient to improve TUG by >/= 12 seconds for improved balance and mobility    Baseline  9/27: 42.79    Time  8    Period  Weeks    Status  New    Target Date  12/24/17      PT LONG TERM GOAL #5   Title  patient to report available community wellness programs available to him    Time  8    Period  Weeks    Status  New    Target Date  12/24/17            Plan - 11/05/17 1055    Clinical Impression Statement  Skilled PT session today focusing on establishing HEP for gentle LE strengthening.  Patient requires seat height increase to perform sit to stand, however comes into full knee extension prior to hip extension due to adapted mode of transfer and due to LE weakness and reduced proprioception. Patient toelrable to all LE strengthening, but does require verbal cueing throughout for correct motor control and form as well as need for rest break dur to fatigue. WIll  continue to progress LE strength, balance, posturing at upcoming visits.     Rehab Potential  Good    PT Frequency  2x / week    PT Duration  8 weeks    PT Treatment/Interventions  ADLs/Self Care Home Management;Gait training;Stair training;Functional mobility training;Therapeutic activities;Therapeutic exercise;Balance training;Patient/family education;Neuromuscular re-education;Manual techniques;Taping    PT Next Visit Plan  focus on functional mobility and strength, posture work, hip flexor stretch    Consulted and Agree with Plan of Care  Patient       Patient will benefit from skilled therapeutic intervention in order to improve the following deficits and impairments:  Abnormal gait, Impaired sensation, Decreased mobility, Decreased coordination, Decreased activity tolerance, Decreased strength, Decreased balance, Difficulty walking  Visit Diagnosis: Unsteadiness on feet  Other abnormalities of gait and mobility  Other symptoms and signs involving the nervous system  Muscle weakness (generalized)  History of falling     Problem List Patient Active Problem List   Diagnosis Date Noted  . Healthcare maintenance 10/26/2017  . Family history of cancer 04/06/2017  . Stasis dermatitis of both legs 11/03/2016  . Dysuria 09/22/2016  . Atypical chest pain 05/05/2016  . Nausea without vomiting 03/12/2016  . Basal cell carcinoma of skin 01/15/2016  . Postherpetic neuralgia 01/15/2016  . 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[place]) 12/18/2015  . Physical deconditioning 12/18/2015  . Kidney stones 12/06/2015  . 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 Macrocytic anemia 11/27/2015  . Lymphoproliferative disorder ([place]) 11/27/2015  . Dyspnea 11/27/2015  . 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 Frequent falls 11/27/2015  . Deficiency anemia 11/17/2015  . Cracking skin 10/02/2015  . Advance care planning 08/17/2014  . Shingles 07/18/2014  . LFT elevation  08/13/2013  . Edema 08/13/2013  . 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 Medicare annual wellness visit, subsequent 12/18/2011  . CIDP (chronic inflammatory demyelinating polyneuropathy) ([place]) 05/18/2011  . 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 Long term systemic steroid user 05/18/2011  . Hyperglycemia, drug-induced 06/05/2010  . VASOVAGAL SYNCOPE 08/05/2007  . Essential hypertension 10/06/2006  . SYSTOLIC MURMUR 09/40/7680     Lanney Gins, PT, DPT Supplemental Physical Therapist 11/05/17 11:53 AM Pager: [phone] Office: [facility] [address] [place], [place], [address] Phone: [phone]   Fax:  [phone]  Name: [name] MRN: [id number] Date of Birth: 1940/10/06

## 2017-11-09 ENCOUNTER — Other Ambulatory Visit: Payer: Self-pay | Admitting: Hematology and Oncology

## 2017-11-10 ENCOUNTER — Ambulatory Visit: Payer: Medicare Other | Admitting: Physical Therapy

## 2017-11-12 ENCOUNTER — Encounter: Payer: Self-pay | Admitting: Physical Therapy

## 2017-11-12 ENCOUNTER — Ambulatory Visit: Payer: Medicare Other | Admitting: Physical Therapy

## 2017-11-12 DIAGNOSIS — R29818 Other symptoms and signs involving the nervous system: Secondary | ICD-10-CM

## 2017-11-12 DIAGNOSIS — R2681 Unsteadiness on feet: Secondary | ICD-10-CM | POA: Diagnosis not present

## 2017-11-12 DIAGNOSIS — M6281 Muscle weakness (generalized): Secondary | ICD-10-CM

## 2017-11-12 DIAGNOSIS — R2689 Other abnormalities of gait and mobility: Secondary | ICD-10-CM

## 2017-11-12 DIAGNOSIS — Z9181 History of falling: Secondary | ICD-10-CM

## 2017-11-12 NOTE — Therapy (Signed)
[facility] [address] [place], [place], [address] Phone: [phone]   Fax:  [phone]  Physical Therapy Treatment  Patient Details  Name: [name] MRN: [id number] Date of Birth: 1940-10-15 Referring Provider (PT): Dr. Heath Lark   Encounter Date: 11/12/2017  PT End of Session - 11/12/17 0937    Visit Number  3    Number of Visits  17    Date for PT Re-Evaluation  12/24/17    Authorization Type  [employer] Medicare    PT Start Time  0932    PT Stop Time  1016    PT Time Calculation (min)  44 min    Activity Tolerance  Patient tolerated treatment well    Behavior During Therapy  [facility] for tasks assessed/performed       Past Medical History:  Diagnosis Date  . Basal cell carcinoma of skin    skin cancer, resected by derm (Dr. Evorn Gong)  . Cholelithiasis 01/95   via abdominal ultrasound  . 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 Diverticulosis of colon 01/95  . History of kidney stones   . Hyperglycemia    Related to chronic steroid use  . Hypertension 08/07  . Nephrolithiasis 1993   X 2  . Polyneuropathy     Chronic inflammatory demylenating - Prednisone per ONG  . Syncope 08/05/07   Presumed hypotension    Past Surgical History:  Procedure Laterality Date  . CHOLECYSTECTOMY  10/27/99   Hassell Done)  . DOPPLER ECHOCARDIOGRAPHY  08/01   T.R.; T.R.; P/R. borderline LVH  . ESOPHAGOGASTRODUODENOSCOPY  12/19/05   gastritis; H.H.  . HERNIA REPAIR  06/17/04   Laparoscopic, bilateral  . [place]  . LUMBAR DISC SURGERY  2000   L3/4 rupture  . MOHS SURGERY Left 01/09/2016   left side of nose  . nephrolithiasis      There were no vitals filed for this visit.  Subjective Assessment - 11/12/17 0936    Subjective  doing well - has been performing HEP without issue. WIfe present for session    Patient is accompained by:  Family member   wife   Pertinent History   Large granular lymphocytic leukemia, CIDP (chronic inflammatory demyelinating polyneuropathy), Steroid-induced myopathy, HTN, frequent falls, MGUS (monoclonal gammopathy of unknown significance)    Patient Stated Goals  "I want to be active, walk, and move around"    Currently in Pain?  Yes    Pain Score  3     Pain Location  Head    Pain Orientation  Right    Pain Descriptors / Indicators  Headache    Pain Type  Chronic pain                       [facility] Adult PT Treatment/Exercise - 11/12/17 0940      Exercises   Exercises  Shoulder;Other Exercises;Knee/Hip    Other Exercises   fwd/bwd shoulder rolls x 10 each direction      Knee/Hip Exercises: Stretches   Hip Flexor Stretch  Both;3 reps;30 seconds    Hip Flexor Stretch Limitations  supine with LE off edge of mat table - discussed core activation for posterior pelvic tilt to increase stretch      Knee/Hip Exercises: Standing   Other Standing Knee Exercises  standing alternating high knee march in // bars x 12 each LE - VC for forward gaze and posture    Other  Standing Knee Exercises  trunk rotation in // bars - VC for posturing      Shoulder Exercises: Seated   Horizontal ABduction  AROM;Both;10 reps    Horizontal ABduction Limitations  + scap retraction - R UE below horizon and with ER to prevent shoulder pain    External Rotation  Strengthening;Both;12 reps;Theraband    Theraband Level (Shoulder External Rotation)  Level 2 (Red)    Other Seated Exercises  scap retraction 12 x 5 sec hold    Other Seated Exercises  chin tuck 12 x 5 sec hold - VC for form                PT Short Term Goals - 10/29/17 1402      PT SHORT TERM GOAL #1   Title  patient to be independent with initial HEP    Time  4    Period  Weeks    Status  New    Target Date  11/26/17      PT SHORT TERM GOAL #2   Title  patient to improve TUG by >/= 6 seconds demonstrating improved functional mobility    Baseline  9/27: 42.79 sec     Time  4    Period  Weeks    Status  New    Target Date  11/26/17      PT SHORT TERM GOAL #3   Title  patient to demonstrate upright posturing with gait >/= 50% of the time    Time  4    Period  Weeks    Status  New    Target Date  11/26/17        PT Long Term Goals - 10/29/17 1404      PT LONG TERM GOAL #1   Title  patient to be independent with advanced HEP    Time  8    Period  Weeks    Status  New    Target Date  12/24/17      PT LONG TERM GOAL #2   Title  patient to improve gait speed to >/= 2.82 ft/sec demonstrating improved functional mobility    Baseline  9/27: 2.0 ft/sec    Time  8    Period  Weeks    Status  New    Target Date  12/24/17      PT LONG TERM GOAL #3   Title  patient to improve 5x sit to stand by >/= 10 seconds    Time  8    Period  Weeks    Status  New    Target Date  12/24/17      PT LONG TERM GOAL #4   Title  patient to improve TUG by >/= 12 seconds for improved balance and mobility    Baseline  9/27: 42.79    Time  8    Period  Weeks    Status  New    Target Date  12/24/17      PT LONG TERM GOAL #5   Title  patient to report available community wellness programs available to him    Time  8    Period  Weeks    Status  New    Target Date  12/24/17            Plan - 11/12/17 7371    Clinical Impression Statement  Patient presenting with wife. Reports no issues or concerns from initial HEP at last visit. PT session today focusing  on incorporating posture work as this was a concern from patient and family. Patient requiring verbal cueing throughout to reduce rounded shoulders and to reduce reliance on back rest for support. Updated HEP to include posturing activities as well as thoracic/lumbar rotation and stretching at hip flexors for hopeful improvements in upright posturing. Will continue to progress towards goals.     Rehab Potential  Good    PT Frequency  2x / week    PT Duration  8 weeks    PT Treatment/Interventions   ADLs/Self Care Home Management;Gait training;Stair training;Functional mobility training;Therapeutic activities;Therapeutic exercise;Balance training;Patient/family education;Neuromuscular re-education;Manual techniques;Taping    PT Next Visit Plan  focus on functional mobility and strength, posture work, balance activities    PT Home Exercise Plan  [email or web address]     Consulted and Agree with Plan of Care  Patient       Patient will benefit from skilled therapeutic intervention in order to improve the following deficits and impairments:  Abnormal gait, Impaired sensation, Decreased mobility, Decreased coordination, Decreased activity tolerance, Decreased strength, Decreased balance, Difficulty walking  Visit Diagnosis: Unsteadiness on feet  Other abnormalities of gait and mobility  Other symptoms and signs involving the nervous system  Muscle weakness (generalized)  History of falling     Problem List Patient Active Problem List   Diagnosis Date Noted  . Healthcare maintenance 10/26/2017  . Family history of cancer 04/06/2017  . Stasis dermatitis of both legs 11/03/2016  . Dysuria 09/22/2016  . Atypical chest pain 05/05/2016  . Nausea without vomiting 03/12/2016  . Basal cell carcinoma of skin 01/15/2016  . Postherpetic neuralgia 01/15/2016  . 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[place]) 12/18/2015  . Physical deconditioning 12/18/2015  . Kidney stones 12/06/2015  . 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 Macrocytic anemia 11/27/2015  . Lymphoproliferative disorder ([place]) 11/27/2015  . Dyspnea 11/27/2015  . 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 Frequent falls 11/27/2015  . Deficiency anemia 11/17/2015  . Cracking skin 10/02/2015  . Advance care planning 08/17/2014  . Shingles 07/18/2014  . LFT elevation 08/13/2013  . Edema 08/13/2013  . 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 Medicare annual wellness visit, subsequent 12/18/2011  . CIDP (chronic  inflammatory demyelinating polyneuropathy) ([place]) 05/18/2011  . 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 Long term systemic steroid user 05/18/2011  . Hyperglycemia, drug-induced 06/05/2010  . VASOVAGAL SYNCOPE 08/05/2007  . Essential hypertension 10/06/2006  . SYSTOLIC MURMUR 46/65/9935     Lanney Gins, PT, DPT Supplemental Physical Therapist 11/12/17 10:51 AM Pager: [phone] Office: [place] [facility] [address] [place], [place], [address] Phone: [phone]   Fax:  [phone]  Name: [name] MRN: [id number] Date of Birth: [birth year]

## 2017-11-17 ENCOUNTER — Ambulatory Visit: Payer: Medicare Other

## 2017-11-19 ENCOUNTER — Ambulatory Visit: Payer: Medicare Other | Admitting: Physical Therapy

## 2017-11-19 DIAGNOSIS — R2681 Unsteadiness on feet: Secondary | ICD-10-CM

## 2017-11-19 DIAGNOSIS — R2689 Other abnormalities of gait and mobility: Secondary | ICD-10-CM

## 2017-11-19 DIAGNOSIS — M6281 Muscle weakness (generalized): Secondary | ICD-10-CM

## 2017-11-19 DIAGNOSIS — R29818 Other symptoms and signs involving the nervous system: Secondary | ICD-10-CM

## 2017-11-19 DIAGNOSIS — Z9181 History of falling: Secondary | ICD-10-CM

## 2017-11-19 NOTE — Therapy (Signed)
[facility] [address] [place], [place], [address] Phone: [phone]   Fax:  [phone]  Physical Therapy Treatment  Patient Details  Name: [name] MRN: [id number] Date of Birth: Dec 03, 1940 Referring Provider (PT): Dr. Heath Lark   Encounter Date: 11/19/2017  PT End of Session - 11/19/17 1105    Visit Number  4    Number of Visits  17    Date for PT Re-Evaluation  12/24/17    Authorization Type  [employer] Medicare    PT Start Time  1540    PT Stop Time  1058    PT Time Calculation (min)  43 min    Activity Tolerance  Patient tolerated treatment well    Behavior During Therapy  [facility] for tasks assessed/performed       Past Medical History:  Diagnosis Date  . Basal cell carcinoma of skin    skin cancer, resected by derm (Dr. Evorn Gong)  . Cholelithiasis 01/95   via abdominal ultrasound  . 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 Diverticulosis of colon 01/95  . History of kidney stones   . Hyperglycemia    Related to chronic steroid use  . Hypertension 08/07  . Nephrolithiasis 1993   X 2  . Polyneuropathy     Chronic inflammatory demylenating - Prednisone per [facility]  . Syncope 08/05/07   Presumed hypotension    Past Surgical History:  Procedure Laterality Date  . CHOLECYSTECTOMY  10/27/99   Hassell Done)  . DOPPLER ECHOCARDIOGRAPHY  08/01   T.R.; T.R.; P/R. borderline LVH  . ESOPHAGOGASTRODUODENOSCOPY  12/19/05   gastritis; H.H.  . HERNIA REPAIR  06/17/04   Laparoscopic, bilateral  . [place]  . LUMBAR DISC SURGERY  2000   L3/4 rupture  . MOHS SURGERY Left 01/09/2016   left side of nose  . nephrolithiasis      There were no vitals filed for this visit.  Subjective Assessment - 11/19/17 1029    Subjective  Pt relays he feels tight today    Currently in Pain?  Yes    Pain Score  2     Pain Location  Shoulder    Pain Orientation  Right                        [facility] Adult PT Treatment/Exercise - 11/19/17 0001      Ambulation/Gait   Ambulation/Gait Assistance  5: Supervision    Ambulation Distance (Feet)  230 Feet   2 laps   Assistive device  Rolling walker    Gait Pattern  Step-through pattern;Decreased stride length;Decreased hip/knee flexion - right;Decreased hip/knee flexion - left;Decreased dorsiflexion - right;Decreased dorsiflexion - left;Trunk flexed;Ataxic      Exercises   Exercises  Knee/Hip    Other Exercises   fwd/bwd shoulder rolls x 10 each direction      Knee/Hip Exercises: Standing   Other Standing Knee Exercises  standing alternating high knee march, H.S curls, hip abd with UE support of RW X 15 each bilat    Other Standing Knee Exercises  standing mini squats with UE support on RW, over chair X 5, needs cues for techiques to sit more back when squatting.       Knee/Hip Exercises: Seated   Long Arc Quad  Both;15 reps    Sit to [employer]  10 reps   with UE support and RW     Shoulder Exercises: Seated  Other Seated Exercises  scap retraction 15 x 5 sec hold    Other Seated Exercises  chin tuck 15 x 5 sec hold - VC for form                PT Short Term Goals - 10/29/17 1402      PT SHORT TERM GOAL #1   Title  patient to be independent with initial HEP    Time  4    Period  Weeks    Status  New    Target Date  11/26/17      PT SHORT TERM GOAL #2   Title  patient to improve TUG by >/= 6 seconds demonstrating improved functional mobility    Baseline  9/27: 42.79 sec    Time  4    Period  Weeks    Status  New    Target Date  11/26/17      PT SHORT TERM GOAL #3   Title  patient to demonstrate upright posturing with gait >/= 50% of the time    Time  4    Period  Weeks    Status  New    Target Date  11/26/17        PT Long Term Goals - 10/29/17 1404      PT LONG TERM GOAL #1   Title  patient to be independent with advanced HEP    Time  8    Period  Weeks    Status   New    Target Date  12/24/17      PT LONG TERM GOAL #2   Title  patient to improve gait speed to >/= 2.82 ft/sec demonstrating improved functional mobility    Baseline  9/27: 2.0 ft/sec    Time  8    Period  Weeks    Status  New    Target Date  12/24/17      PT LONG TERM GOAL #3   Title  patient to improve 5x sit to stand by >/= 10 seconds    Time  8    Period  Weeks    Status  New    Target Date  12/24/17      PT LONG TERM GOAL #4   Title  patient to improve TUG by >/= 12 seconds for improved balance and mobility    Baseline  9/27: 42.79    Time  8    Period  Weeks    Status  New    Target Date  12/24/17      PT LONG TERM GOAL #5   Title  patient to report available community wellness programs available to him    Time  8    Period  Weeks    Status  New    Target Date  12/24/17            Plan - 11/19/17 1106    Clinical Impression Statement  Pt able to progress reps some for funcitonal strengthening program. Sit to stand was attempted with one arm pushing from chair and one arm pushing from walker but he was not successful with this due to leg weakness. He was however able to perform 10 sit to stands with ax pad in chair using bilateral push up from chair rails. He was able to increase ambulation distance to 2 laps (230 ft) with good tolerance requiring only supervision but he does still have Rt foot slap and knee hyperextension. Pt showed good  effort with PT and will continue to benefit from strengthening and activity tolerance.     Rehab Potential  Good    PT Frequency  2x / week    PT Duration  8 weeks    PT Treatment/Interventions  ADLs/Self Care Home Management;Gait training;Stair training;Functional mobility training;Therapeutic activities;Therapeutic exercise;Balance training;Patient/family education;Neuromuscular re-education;Manual techniques;Taping    PT Next Visit Plan  focus on functional mobility and strength, posture work, balance activities    Consulted  and Agree with Plan of Care  Patient       Patient will benefit from skilled therapeutic intervention in order to improve the following deficits and impairments:  Abnormal gait, Impaired sensation, Decreased mobility, Decreased coordination, Decreased activity tolerance, Decreased strength, Decreased balance, Difficulty walking  Visit Diagnosis: Unsteadiness on feet  Other abnormalities of gait and mobility  Other symptoms and signs involving the nervous system  Muscle weakness (generalized)  History of falling     Problem List Patient Active Problem List   Diagnosis Date Noted  . Healthcare maintenance 10/26/2017  . Family history of cancer 04/06/2017  . Stasis dermatitis of both legs 11/03/2016  . Dysuria 09/22/2016  . Atypical chest pain 05/05/2016  . Nausea without vomiting 03/12/2016  . Basal cell carcinoma of skin 01/15/2016  . Postherpetic neuralgia 01/15/2016  . 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 Physical deconditioning 12/18/2015  . Kidney stones 12/06/2015  . 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 Macrocytic anemia 11/27/2015  . Lymphoproliferative disorder ([place]) 11/27/2015  . Dyspnea 11/27/2015  . 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 Frequent falls 11/27/2015  . Deficiency anemia 11/17/2015  . Cracking skin 10/02/2015  . Advance care planning 08/17/2014  . Shingles 07/18/2014  . LFT elevation 08/13/2013  . Edema 08/13/2013  . 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 Medicare annual wellness visit, subsequent 12/18/2011  . CIDP (chronic inflammatory demyelinating polyneuropathy) ([place]) 05/18/2011  . 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 Long term systemic steroid user 05/18/2011  . Hyperglycemia, drug-induced 06/05/2010  . VASOVAGAL SYNCOPE 08/05/2007  . Essential hypertension 10/06/2006  . SYSTOLIC MURMUR 50/10/3816    Debbe Odea, PT, DPT 11/19/2017, 11:39 AM  [facility] [address] [place], [place], [address] Phone: [phone]   Fax:  [phone]  Name: [name] MRN: [id number] Date of Birth: [birth year]

## 2017-11-23 ENCOUNTER — Ambulatory Visit: Payer: Medicare Other

## 2017-11-23 DIAGNOSIS — R2681 Unsteadiness on feet: Secondary | ICD-10-CM

## 2017-11-23 DIAGNOSIS — R2689 Other abnormalities of gait and mobility: Secondary | ICD-10-CM

## 2017-11-23 DIAGNOSIS — R29818 Other symptoms and signs involving the nervous system: Secondary | ICD-10-CM

## 2017-11-23 DIAGNOSIS — M6281 Muscle weakness (generalized): Secondary | ICD-10-CM

## 2017-11-23 NOTE — Patient Instructions (Signed)
Access Code: [email or web address]  URL: [email or web address]  Date: 11/23/2017  Prepared by: Geoffry Paradise   Exercises  Seated Scapular Retraction - 10 reps - 2 sets - 1x daily - 7x weekly  Seated Cervical Retraction - 10 reps - 2 sets - 1x daily - 7x weekly  Shoulder Horizontal Abduction - Thumbs Up - 10 reps - 1-2 sets - 1x daily - 7x weekly  Standing Shoulder External Rotation with Resistance - 10 reps - 2 sets - 1x daily - 7x weekly  Standing March with Counter Support - 10 reps - 2 sets - 1x daily - 7x weekly  Standing Thoracic Rotation with [place] - 10 reps - 1-2 sets - 1x daily - 7x weekly  Supine Hip Flexor Stretch with Weight - 3 reps - 2 sets - 30-60 hold - 1x daily - 7x weekly

## 2017-11-23 NOTE — Therapy (Signed)
[facility] [address] [place], [place], [address] Phone: [phone]   Fax:  [phone]  Physical Therapy Treatment  Patient Details  Name: [name] MRN: [id number] Date of Birth: Jun 06, 1940 Referring Provider (PT): Dr. Heath Lark   Encounter Date: 11/23/2017  PT End of Session - 11/23/17 1058    Visit Number  5    Number of Visits  17    Date for PT Re-Evaluation  12/24/17    Authorization Type  [employer]HC Medicare    PT Start Time  1016    PT Stop Time  1057    PT Time Calculation (min)  41 min    Equipment Utilized During Treatment  Gait belt    Activity Tolerance  Patient tolerated treatment well;Patient limited by fatigue    Behavior During Therapy  [facility] for tasks assessed/performed       Past Medical History:  Diagnosis Date  . Basal cell carcinoma of skin    skin cancer, resected by derm (Dr. Evorn Gong)  . Cholelithiasis 01/95   via abdominal ultrasound  . 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 Diverticulosis of colon 01/95  . History of kidney stones   . Hyperglycemia    Related to chronic steroid use  . Hypertension 08/07  . Nephrolithiasis 1993   X 2  . Polyneuropathy     Chronic inflammatory demylenating - Prednisone per MPN  . Syncope 08/05/07   Presumed hypotension    Past Surgical History:  Procedure Laterality Date  . CHOLECYSTECTOMY  10/27/99   Hassell Done)  . DOPPLER ECHOCARDIOGRAPHY  08/01   T.R.; T.R.; P/R. borderline LVH  . ESOPHAGOGASTRODUODENOSCOPY  12/19/05   gastritis; H.H.  . HERNIA REPAIR  06/17/04   Laparoscopic, bilateral  . [place]  . LUMBAR DISC SURGERY  2000   L3/4 rupture  . MOHS SURGERY Left 01/09/2016   left side of nose  . nephrolithiasis      There were no vitals filed for this visit.  Subjective Assessment - 11/23/17 1019    Subjective  Pt denied falls or changes since last visit.     Patient is accompained  by:  Family member   [name]: wife   Pertinent History  Large granular lymphocytic leukemia, CIDP (chronic inflammatory demyelinating polyneuropathy), Steroid-induced myopathy, HTN, frequent falls, MGUS (monoclonal gammopathy of unknown significance)    Patient Stated Goals  "I want to be active, walk, and move around"    Currently in Pain?  Yes    Pain Score  --   0/10 at rest and on average 5/10 pain with movement   Pain Location  Shoulder    Pain Orientation  Right    Pain Descriptors / Indicators  Aching;Sharp    Pain Type  Chronic pain    Pain Onset  More than a month ago    Pain Frequency  Intermittent    Aggravating Factors   moving it in certain ways    Pain Relieving Factors  rest         Therex: Access Code: [email or web address]  URL: [email or web address]  Date: 11/23/2017  Prepared by: Geoffry Paradise   Exercises  Seated Scapular Retraction - 10 reps - 2 sets - 1x daily - 7x weekly  Seated Cervical Retraction - 10 reps - 2 sets - 1x daily - 7x weekly  Shoulder Horizontal Abduction - Thumbs Up - 10 reps - 1-2 sets - 1x daily - 7x weekly  Standing  Shoulder External Rotation with Resistance - 10 reps - 2 sets - 1x daily - 7x weekly  Standing March with Counter Support - 10 reps - 2 sets - 1x daily - 7x weekly  Standing Thoracic Rotation with Dowel - 10 reps - 1-2 sets - 1x daily - 7x weekly  Supine Hip Flexor Stretch with Weight - 3 reps - 2 sets - 30-60 hold - 1x daily - 7x weekly   Pt performed with S to ensure safety and proper technique. Pt tried to progress to one UE support during marches but experienced poor technique, however, pt was able to progress to one UE support during trunk rotation with proper technique. Pt required rest breaks 2/2 fatigue. Cues to decr. B shoulder shrug during posture exercises.                [facility] Adult PT Treatment/Exercise - 11/23/17 1022      Ambulation/Gait   Ambulation/Gait  Yes    Ambulation/Gait Assistance  4: Min  guard;5: Supervision    Ambulation/Gait Assistance Details  Min guard to S for safety. Cues for upright posture and to control B foot drop.     Ambulation Distance (Feet)  230 Feet   and 100'   Assistive device  Rolling walker    Gait Pattern  Step-through pattern;Decreased stride length;Decreased hip/knee flexion - right;Decreased hip/knee flexion - left;Decreased dorsiflexion - right;Decreased dorsiflexion - left;Trunk flexed;Ataxic    Ambulation Surface  Level;Indoor      Standardized Balance Assessment   Standardized Balance Assessment  Timed Up and Go Test;Berg Balance Test      Timed Up and Go Test   TUG  Normal TUG    Normal TUG (seconds)  32.37   with RW            PT Education - 11/23/17 1057    Education Details  PT reviewed goal progress and HEP.     Person(s) Educated  Patient;Spouse    Methods  Explanation    Comprehension  Verbalized understanding;Returned demonstration       PT Short Term Goals - 11/23/17 1059      PT SHORT TERM GOAL #1   Title  patient to be independent with initial HEP    Time  4    Period  Weeks    Status  Achieved      PT SHORT TERM GOAL #2   Title  patient to improve TUG by >/= 6 seconds demonstrating improved functional mobility    Baseline  9/27: 42.79 sec    Time  4    Period  Weeks    Status  Achieved      PT SHORT TERM GOAL #3   Title  patient to demonstrate upright posturing with gait >/= 50% of the time    Time  4    Period  Weeks    Status  Partially Met        PT Long Term Goals - 10/29/17 1404      PT LONG TERM GOAL #1   Title  patient to be independent with advanced HEP    Time  8    Period  Weeks    Status  New    Target Date  12/24/17      PT LONG TERM GOAL #2   Title  patient to improve gait speed to >/= 2.82 ft/sec demonstrating improved functional mobility    Baseline  9/27: 2.0 ft/sec    Time  8    Period  Weeks    Status  New    Target Date  12/24/17      PT LONG TERM GOAL #3   Title   patient to improve 5x sit to stand by >/= 10 seconds    Time  8    Period  Weeks    Status  New    Target Date  12/24/17      PT LONG TERM GOAL #4   Title  patient to improve TUG by >/= 12 seconds for improved balance and mobility    Baseline  9/27: 42.79    Time  8    Period  Weeks    Status  New    Target Date  12/24/17      PT LONG TERM GOAL #5   Title  patient to report available community wellness programs available to him    Time  8    Period  Weeks    Status  New    Target Date  12/24/17            Plan - 11/23/17 1058    Clinical Impression Statement  Pt demonstrated progress, as he met STGs 1 and 2. Pt partially met STG 3, as he was able to improve upright posture with cues during amb, but not >50% of the time. Pt's TUG time continues to indicate pt is at a high risk for falls. Pt would continue to benefit from skilled PT to improve safety during functional mobility.     Rehab Potential  Good    PT Frequency  2x / week    PT Duration  8 weeks    PT Treatment/Interventions  ADLs/Self Care Home Management;Gait training;Stair training;Functional mobility training;Therapeutic activities;Therapeutic exercise;Balance training;Patient/family education;Neuromuscular re-education;Manual techniques;Taping    PT Next Visit Plan  focus on functional mobility and strength, posture work, balance activities    Consulted and Agree with Plan of Care  Patient       Patient will benefit from skilled therapeutic intervention in order to improve the following deficits and impairments:  Abnormal gait, Impaired sensation, Decreased mobility, Decreased coordination, Decreased activity tolerance, Decreased strength, Decreased balance, Difficulty walking  Visit Diagnosis: Other abnormalities of gait and mobility  Unsteadiness on feet  Other symptoms and signs involving the nervous system  Muscle weakness (generalized)     Problem List Patient Active Problem List   Diagnosis Date  Noted  . Healthcare maintenance 10/26/2017  . Family history of cancer 04/06/2017  . Stasis dermatitis of both legs 11/03/2016  . Dysuria 09/22/2016  . Atypical chest pain 05/05/2016  . Nausea without vomiting 03/12/2016  . Basal cell carcinoma of skin 01/15/2016  . Postherpetic neuralgia 01/15/2016  . 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 Physical deconditioning 12/18/2015  . Kidney stones 12/06/2015  . 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 Macrocytic anemia 11/27/2015  . Lymphoproliferative disorder ([place]) 11/27/2015  . Dyspnea 11/27/2015  . 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 Frequent falls 11/27/2015  . Deficiency anemia 11/17/2015  . Cracking skin 10/02/2015  . Advance care planning 08/17/2014  . Shingles 07/18/2014  . LFT elevation 08/13/2013  . Edema 08/13/2013  . 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 Medicare annual wellness visit, subsequent 12/18/2011  . CIDP (chronic inflammatory demyelinating polyneuropathy) ([place]) 05/18/2011  . 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 Long term systemic steroid user 05/18/2011  . Hyperglycemia, drug-induced 06/05/2010  . VASOVAGAL SYNCOPE 08/05/2007  . Essential hypertension 10/06/2006  .  SYSTOLIC MURMUR 79/39/6886    Deundra Bard L 11/23/2017, 11:02 AM  [facility] [address] [place], [place], [address] Phone: [phone]   Fax:  [phone]  Name: EDAHI KROENING MRN: [id number] Date of Birth: 02-Oct-1940  Geoffry Paradise, PT,DPT 11/23/17 11:05 AM Phone: [phone] Fax: [phone]

## 2017-11-26 ENCOUNTER — Ambulatory Visit: Payer: Medicare Other

## 2017-11-26 VITALS — BP 124/63 | HR 73

## 2017-11-26 DIAGNOSIS — M6281 Muscle weakness (generalized): Secondary | ICD-10-CM

## 2017-11-26 DIAGNOSIS — R2681 Unsteadiness on feet: Secondary | ICD-10-CM | POA: Diagnosis not present

## 2017-11-26 DIAGNOSIS — R2689 Other abnormalities of gait and mobility: Secondary | ICD-10-CM

## 2017-11-26 DIAGNOSIS — R29818 Other symptoms and signs involving the nervous system: Secondary | ICD-10-CM

## 2017-11-26 NOTE — Therapy (Signed)
[facility] [address] [place], [place], [address] Phone: [phone]   Fax:  [phone]  Physical Therapy Treatment  Patient Details  Name: [name] MRN: [id number] Date of Birth: Oct 16, 1940 Referring Provider (PT): Dr. Heath Lark   Encounter Date: 11/26/2017  PT End of Session - 11/26/17 1004    Visit Number  6    Number of Visits  17    Date for PT Re-Evaluation  12/24/17    Authorization Type  [employer]HC Medicare    PT Start Time  0936    PT Stop Time  1015    PT Time Calculation (min)  39 min    Activity Tolerance  Patient tolerated treatment well;Patient limited by fatigue    Behavior During Therapy  [facility] for tasks assessed/performed       Past Medical History:  Diagnosis Date  . Basal cell carcinoma of skin    skin cancer, resected by derm (Dr. Evorn Gong)  . Cholelithiasis 01/95   via abdominal ultrasound  . 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 Diverticulosis of colon 01/95  . History of kidney stones   . Hyperglycemia    Related to chronic steroid use  . Hypertension 08/07  . Nephrolithiasis 1993   X 2  . Polyneuropathy     Chronic inflammatory demylenating - Prednisone per [facility]  . Syncope 08/05/07   Presumed hypotension    Past Surgical History:  Procedure Laterality Date  . CHOLECYSTECTOMY  10/27/99   Hassell Done)  . DOPPLER ECHOCARDIOGRAPHY  08/01   T.R.; T.R.; P/R. borderline LVH  . ESOPHAGOGASTRODUODENOSCOPY  12/19/05   gastritis; H.H.  . HERNIA REPAIR  06/17/04   Laparoscopic, bilateral  . [place]  . LUMBAR DISC SURGERY  2000   L3/4 rupture  . MOHS SURGERY Left 01/09/2016   left side of nose  . nephrolithiasis      Vitals:   11/26/17 0939  BP: 124/63  Pulse: 73    Subjective Assessment - 11/26/17 0939    Subjective  Pt denied falls since last visit. Pt reported he feels a little weak today, and has over the last few days and  feels like he needs to sleep.     Patient is accompained by:  Family member   Cathy-wife   Pertinent History  Large granular lymphocytic leukemia, CIDP (chronic inflammatory demyelinating polyneuropathy), Steroid-induced myopathy, HTN, frequent falls, MGUS (monoclonal gammopathy of unknown significance)    Patient Stated Goals  "I want to be active, walk, and move around"    Currently in Pain?  Yes    Pain Score  4     Pain Location  Head    Pain Orientation  Right   around forehead, eyes (post-hepatic neuralgia)   Pain Descriptors / Indicators  Headache    Pain Type  Chronic pain    Pain Onset  More than a month ago    Pain Frequency  Intermittent    Aggravating Factors   nothing    Pain Relieving Factors  lying down, Gabapentin         Neuro re-ed: Seated: pt performed partial STS txf with focus on shifting/rocking forward to unweight buttocks 3x5 reps, cues and demo for proper technique. Pt has difficulty with ant. Weight shifting.  Therex: Seated: cues and demo for technique. B hip marches 3x10/LE, alternating while engaging TrA muscles. B hip abduction 3x10/LE. Pt required rest after each set 2/2 fatigue.  [facility] Adult PT Treatment/Exercise - 11/26/17 1016      Transfers   Transfers  Sit to Stand;Stand to Sit    Sit to Stand  With upper extremity assist;With armrests;From chair/3-in-1;4: Min guard;4: Min assist    Sit to Stand Details (indicate cue type and reason)  Cues to improve ant. weight shifting, with pad placed under pt to incr. surface height.    Stand to Sit  5: Supervision;With upper extremity assist;With armrests;To chair/3-in-1    Stand to Sit Details  Cues to improve eccentric control.     Transfer Cueing  Cues to improve ant. weight shifting vs. locking knees in extension to perform sit to stand.  Performed x2 reps.                PT Short Term Goals - 11/23/17 1059      PT SHORT TERM GOAL #1   Title  patient to be  independent with initial HEP    Time  4    Period  Weeks    Status  Achieved      PT SHORT TERM GOAL #2   Title  patient to improve TUG by >/= 6 seconds demonstrating improved functional mobility    Baseline  9/27: 42.79 sec    Time  4    Period  Weeks    Status  Achieved      PT SHORT TERM GOAL #3   Title  patient to demonstrate upright posturing with gait >/= 50% of the time    Time  4    Period  Weeks    Status  Partially Met        PT Long Term Goals - 10/29/17 1404      PT LONG TERM GOAL #1   Title  patient to be independent with advanced HEP    Time  8    Period  Weeks    Status  New    Target Date  12/24/17      PT LONG TERM GOAL #2   Title  patient to improve gait speed to >/= 2.82 ft/sec demonstrating improved functional mobility    Baseline  9/27: 2.0 ft/sec    Time  8    Period  Weeks    Status  New    Target Date  12/24/17      PT LONG TERM GOAL #3   Title  patient to improve 5x sit to stand by >/= 10 seconds    Time  8    Period  Weeks    Status  New    Target Date  12/24/17      PT LONG TERM GOAL #4   Title  patient to improve TUG by >/= 12 seconds for improved balance and mobility    Baseline  9/27: 42.79    Time  8    Period  Weeks    Status  New    Target Date  12/24/17      PT LONG TERM GOAL #5   Title  patient to report available community wellness programs available to him    Time  8    Period  Weeks    Status  New    Target Date  12/24/17            Plan - 11/26/17 1004    Clinical Impression Statement  Today's skilled session limited today 2/2 to pt reporting feeling more weak but adamant about participating in PT, and vitals  were WNL. PT had pt perform all actvities in seated position, with focus on BLE strength, core activation, and weight shifting-all while taking incr. rest breaks between sets for adequate rest. Session ended early to ensure pt ability to safety amb. after session. Continue with POC.     Rehab Potential   Good    PT Frequency  2x / week    PT Duration  8 weeks    PT Treatment/Interventions  ADLs/Self Care Home Management;Gait training;Stair training;Functional mobility training;Therapeutic activities;Therapeutic exercise;Balance training;Patient/family education;Neuromuscular re-education;Manual techniques;Taping    PT Next Visit Plan  focus on functional mobility and strength, posture work, balance activities    Consulted and Agree with Plan of Care  Patient       Patient will benefit from skilled therapeutic intervention in order to improve the following deficits and impairments:  Abnormal gait, Impaired sensation, Decreased mobility, Decreased coordination, Decreased activity tolerance, Decreased strength, Decreased balance, Difficulty walking  Visit Diagnosis: Muscle weakness (generalized)  Other abnormalities of gait and mobility  Other symptoms and signs involving the nervous system     Problem List Patient Active Problem List   Diagnosis Date Noted  . Healthcare maintenance 10/26/2017  . Family history of cancer 04/06/2017  . Stasis dermatitis of both legs 11/03/2016  . Dysuria 09/22/2016  . Atypical chest pain 05/05/2016  . Nausea without vomiting 03/12/2016  . Basal cell carcinoma of skin 01/15/2016  . Postherpetic neuralgia 01/15/2016  . 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 Physical deconditioning 12/18/2015  . Kidney stones 12/06/2015  . 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 Macrocytic anemia 11/27/2015  . Lymphoproliferative disorder ([place]) 11/27/2015  . Dyspnea 11/27/2015  . 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 Frequent falls 11/27/2015  . Deficiency anemia 11/17/2015  . Cracking skin 10/02/2015  . Advance care planning 08/17/2014  . Shingles 07/18/2014  . LFT elevation 08/13/2013  . Edema 08/13/2013  . 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 Medicare annual wellness visit, subsequent 12/18/2011  . CIDP (chronic  inflammatory demyelinating polyneuropathy) ([place]) 05/18/2011  . 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 Long term systemic steroid user 05/18/2011  . Hyperglycemia, drug-induced 06/05/2010  . VASOVAGAL SYNCOPE 08/05/2007  . Essential hypertension 10/06/2006  . SYSTOLIC MURMUR 19/14/7829    Gizel Riedlinger L 11/26/2017, 10:18 AM  [facility] [address] [place], [place], [address] Phone: [phone]   Fax:  [phone]  Name: [name] MRN: [id number] Date of Birth: 1940/12/06  Geoffry Paradise, PT,DPT 11/26/17 10:18 AM Phone: [phone] Fax: [phone]

## 2017-11-30 ENCOUNTER — Ambulatory Visit: Payer: Medicare Other | Admitting: Physical Therapy

## 2017-11-30 ENCOUNTER — Encounter: Payer: Self-pay | Admitting: Physical Therapy

## 2017-11-30 DIAGNOSIS — M6281 Muscle weakness (generalized): Secondary | ICD-10-CM

## 2017-11-30 DIAGNOSIS — R2689 Other abnormalities of gait and mobility: Secondary | ICD-10-CM

## 2017-11-30 DIAGNOSIS — R2681 Unsteadiness on feet: Secondary | ICD-10-CM

## 2017-11-30 DIAGNOSIS — R29818 Other symptoms and signs involving the nervous system: Secondary | ICD-10-CM

## 2017-11-30 DIAGNOSIS — Z9181 History of falling: Secondary | ICD-10-CM

## 2017-11-30 NOTE — Therapy (Signed)
[facility] [address] [place], [place], [address] Phone: [phone]   Fax:  [phone]  Physical Therapy Treatment  Patient Details  Name: [name] MRN: [id number] Date of Birth: 02/27/1940 Referring Provider (PT): Dr. Heath Lark   Encounter Date: 11/30/2017  PT End of Session - 11/30/17 1501    Visit Number  7    Number of Visits  17    Date for PT Re-Evaluation  12/24/17    Authorization Type  [employer]HC Medicare    PT Start Time  1017    PT Stop Time  1100    PT Time Calculation (min)  43 min    Activity Tolerance  Patient tolerated treatment well    Behavior During Therapy  [facility] for tasks assessed/performed       Past Medical History:  Diagnosis Date  . Basal cell carcinoma of skin    skin cancer, resected by derm (Dr. Evorn Gong)  . Cholelithiasis 01/95   via abdominal ultrasound  . 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 Diverticulosis of colon 01/95  . History of kidney stones   . Hyperglycemia    Related to chronic steroid use  . Hypertension 08/07  . Nephrolithiasis 1993   X 2  . Polyneuropathy     Chronic inflammatory demylenating - Prednisone per [facility]  . Syncope 08/05/07   Presumed hypotension    Past Surgical History:  Procedure Laterality Date  . CHOLECYSTECTOMY  10/27/99   Hassell Done)  . DOPPLER ECHOCARDIOGRAPHY  08/01   T.R.; T.R.; P/R. borderline LVH  . ESOPHAGOGASTRODUODENOSCOPY  12/19/05   gastritis; [facility]  . HERNIA REPAIR  06/17/04   Laparoscopic, bilateral  . [place]  . LUMBAR DISC SURGERY  2000   L3/4 rupture  . MOHS SURGERY Left 01/09/2016   left side of nose  . nephrolithiasis      There were no vitals filed for this visit.  Subjective Assessment - 11/30/17 1501    Subjective  doing well - feeling better than last visit    Patient is accompained by:  Family member   wife   Pertinent History  Large granular lymphocytic  leukemia, CIDP (chronic inflammatory demyelinating polyneuropathy), Steroid-induced myopathy, HTN, frequent falls, MGUS (monoclonal gammopathy of unknown significance)    Patient Stated Goals  "I want to be active, walk, and move around"    Currently in Pain?  Yes    Pain Score  5     Pain Location  Head    Pain Orientation  Right    Pain Descriptors / Indicators  Headache    Pain Type  Chronic pain                       [facility] Adult PT Treatment/Exercise - 11/30/17 0001      Exercises   Exercises  Other Exercises    Other Exercises   seated on green disc: marches x 10, LAQ x 10, paloff press with red tband x 10, alternaitng UE reach x 10; tall kneeling with push into full hip extension  - limited motion; 1/2 stagger stance with red tband at hip focusing on anterior weight shift and reduced extension prosturing x 10 each side; standing with 1 foot on 6" step and holding with neutral pelvic alignment for glute activation x 45 sec each side - very light UE support  PT Short Term Goals - 11/23/17 1059      PT SHORT TERM GOAL #1   Title  patient to be independent with initial HEP    Time  4    Period  Weeks    Status  Achieved      PT SHORT TERM GOAL #2   Title  patient to improve TUG by >/= 6 seconds demonstrating improved functional mobility    Baseline  9/27: 42.79 sec    Time  4    Period  Weeks    Status  Achieved      PT SHORT TERM GOAL #3   Title  patient to demonstrate upright posturing with gait >/= 50% of the time    Time  4    Period  Weeks    Status  Partially Met        PT Long Term Goals - 10/29/17 1404      PT LONG TERM GOAL #1   Title  patient to be independent with advanced HEP    Time  8    Period  Weeks    Status  New    Target Date  12/24/17      PT LONG TERM GOAL #2   Title  patient to improve gait speed to >/= 2.82 ft/sec demonstrating improved functional mobility    Baseline  9/27: 2.0 ft/sec    Time  8     Period  Weeks    Status  New    Target Date  12/24/17      PT LONG TERM GOAL #3   Title  patient to improve 5x sit to stand by >/= 10 seconds    Time  8    Period  Weeks    Status  New    Target Date  12/24/17      PT LONG TERM GOAL #4   Title  patient to improve TUG by >/= 12 seconds for improved balance and mobility    Baseline  9/27: 42.79    Time  8    Period  Weeks    Status  New    Target Date  12/24/17      PT LONG TERM GOAL #5   Title  patient to report available community wellness programs available to him    Time  8    Period  Weeks    Status  New    Target Date  12/24/17            Plan - 11/30/17 1502    Clinical Impression Statement  Patient doing well today - heavy focus on core activation as well as anterior weight shifting and full hip extension. Patient requiring at least 1 UE support for all stnading activities, but with good attempt to reduce weight bearing through UE. Does continue to require rest breaks to prevent excessive fatigue. Making good progress.     Rehab Potential  Good    PT Frequency  2x / week    PT Duration  8 weeks    PT Treatment/Interventions  ADLs/Self Care Home Management;Gait training;Stair training;Functional mobility training;Therapeutic activities;Therapeutic exercise;Balance training;Patient/family education;Neuromuscular re-education;Manual techniques;Taping    PT Next Visit Plan  focus on functional mobility and strength, posture work, balance activities    Consulted and Agree with Plan of Care  Patient       Patient will benefit from skilled therapeutic intervention in order to improve the following deficits and impairments:  Abnormal gait, Impaired sensation,  Decreased mobility, Decreased coordination, Decreased activity tolerance, Decreased strength, Decreased balance, Difficulty walking  Visit Diagnosis: Muscle weakness (generalized)  Other abnormalities of gait and mobility  Other symptoms and signs involving the  nervous system  Unsteadiness on feet  History of falling     Problem List Patient Active Problem List   Diagnosis Date Noted  . Healthcare maintenance 10/26/2017  . Family history of cancer 04/06/2017  . Stasis dermatitis of both legs 11/03/2016  . Dysuria 09/22/2016  . Atypical chest pain 05/05/2016  . Nausea without vomiting 03/12/2016  . Basal cell carcinoma of skin 01/15/2016  . Postherpetic neuralgia 01/15/2016  . 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 Physical deconditioning 12/18/2015  . Kidney stones 12/06/2015  . 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 Macrocytic anemia 11/27/2015  . Lymphoproliferative disorder ([place]) 11/27/2015  . Dyspnea 11/27/2015  . 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 Frequent falls 11/27/2015  . Deficiency anemia 11/17/2015  . Cracking skin 10/02/2015  . Advance care planning 08/17/2014  . Shingles 07/18/2014  . LFT elevation 08/13/2013  . Edema 08/13/2013  . 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 Medicare annual wellness visit, subsequent 12/18/2011  . CIDP (chronic inflammatory demyelinating polyneuropathy) ([place]) 05/18/2011  . 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 Long term systemic steroid user 05/18/2011  . Hyperglycemia, drug-induced 06/05/2010  . VASOVAGAL SYNCOPE 08/05/2007  . Essential hypertension 10/06/2006  . SYSTOLIC MURMUR 37/34/2876    Lanney Gins, PT, DPT Supplemental Physical Therapist 11/30/17 3:09 PM Pager: [phone] Office: [facility] [address] [place], [place], [address] Phone: [phone]   Fax:  [phone]  Name: [name] MRN: [id number] Date of Birth: 12/03/1940

## 2017-12-02 ENCOUNTER — Ambulatory Visit: Payer: Medicare Other

## 2017-12-02 DIAGNOSIS — R2681 Unsteadiness on feet: Secondary | ICD-10-CM

## 2017-12-02 DIAGNOSIS — R29818 Other symptoms and signs involving the nervous system: Secondary | ICD-10-CM

## 2017-12-02 DIAGNOSIS — R2689 Other abnormalities of gait and mobility: Secondary | ICD-10-CM

## 2017-12-02 NOTE — Therapy (Signed)
[facility] [address] [place], [place], [address] Phone: [phone]   Fax:  [phone]  Physical Therapy Treatment  Patient Details  Name: [name] MRN: [id number] Date of Birth: 01-17-1941 Referring Provider (PT): Dr. Heath Lark   Encounter Date: 12/02/2017  PT End of Session - 12/02/17 1230    Visit Number  8    Number of Visits  17    Date for PT Re-Evaluation  12/24/17    Authorization Type  [employer]HC Medicare    PT Start Time  1020    PT Stop Time  1058    PT Time Calculation (min)  38 min    Equipment Utilized During Treatment  Gait belt    Activity Tolerance  Patient limited by fatigue    Behavior During Therapy  WFL for tasks assessed/performed       Past Medical History:  Diagnosis Date  . Basal cell carcinoma of skin    skin cancer, resected by derm (Dr. Evorn Gong)  . Cholelithiasis 01/95   via abdominal ultrasound  . 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 Diverticulosis of colon 01/95  . History of kidney stones   . Hyperglycemia    Related to chronic steroid use  . Hypertension 08/07  . Nephrolithiasis 1993   X 2  . Polyneuropathy     Chronic inflammatory demylenating - Prednisone per [facility]  . Syncope 08/05/07   Presumed hypotension    Past Surgical History:  Procedure Laterality Date  . CHOLECYSTECTOMY  10/27/99   Hassell Done)  . DOPPLER ECHOCARDIOGRAPHY  08/01   T.R.; T.R.; P/R. borderline LVH  . ESOPHAGOGASTRODUODENOSCOPY  12/19/05   gastritis; H.H.  . HERNIA REPAIR  06/17/04   Laparoscopic, bilateral  . [place]  . LUMBAR DISC SURGERY  2000   L3/4 rupture  . MOHS SURGERY Left 01/09/2016   left side of nose  . nephrolithiasis      There were no vitals filed for this visit.  Subjective Assessment - 12/02/17 1023    Subjective  Pt denied falls since last visit. Pt reported he was very tired after last visit, and had to take a four  hour nap afterwards. Pt is still a bit stiff/sore from last session.     Patient is accompained by:  Family member   [name]   Pertinent History  Large granular lymphocytic leukemia, CIDP (chronic inflammatory demyelinating polyneuropathy), Steroid-induced myopathy, HTN, frequent falls, MGUS (monoclonal gammopathy of unknown significance)    Patient Stated Goals  "I want to be active, walk, and move around"    Currently in Pain?  No/denies                       [facility] Adult PT Treatment/Exercise - 12/02/17 1025      Transfers   Transfers  Sit to Stand;Stand to Sit    Sit to Stand  With upper extremity assist;With armrests;From chair/3-in-1;4: Min guard;4: Min assist    Sit to Stand Details (indicate cue type and reason)  Cues to scoot forward and to improve ant. weight shifting. x10 reps (once after each bout of amb. and after rest breaks durinf balance activities.     Stand to Sit  5: Supervision;With upper extremity assist;With armrests;To chair/3-in-1    Stand to Sit Details  Cues to improve eccentric control     Number of Reps  10 reps      Ambulation/Gait  Ambulation/Gait  Yes    Ambulation/Gait Assistance  5: Supervision    Ambulation/Gait Assistance Details  Cues to improve upright posture, stay close to RW and to control B knee ext. in stance phase.    Ambulation Distance (Feet)  115 Feet   x3 and 75'x2   Assistive device  Rolling walker    Gait Pattern  Step-through pattern;Decreased stride length;Decreased hip/knee flexion - right;Decreased hip/knee flexion - left;Decreased dorsiflexion - right;Decreased dorsiflexion - left;Trunk flexed;Ataxic    Ambulation Surface  Level;Indoor      Seated rest breaks after each bout of amb.     Balance Exercises - 12/02/17 1037      Balance Exercises: Standing   Standing Eyes Opened  Wide (BOA);Head turns;Solid surface;3 reps;Other reps (comment)   2x 10 reps head turns/nods   Standing Eyes Closed  Wide (BOA);Solid  surface;4 reps   2 x 4 reps   Other Standing Exercises  Performed with chair behind pt and RW in front of pt and min guard to min A for safety. Seated rest breaks required to reduce fatigue. Performed with 1 UE support with feet apart static stance progressed to 10 sec. without UE support x5 reps.           PT Short Term Goals - 11/23/17 1059      PT SHORT TERM GOAL #1   Title  patient to be independent with initial HEP    Time  4    Period  Weeks    Status  Achieved      PT SHORT TERM GOAL #2   Title  patient to improve TUG by >/= 6 seconds demonstrating improved functional mobility    Baseline  9/27: 42.79 sec    Time  4    Period  Weeks    Status  Achieved      PT SHORT TERM GOAL #3   Title  patient to demonstrate upright posturing with gait >/= 50% of the time    Time  4    Period  Weeks    Status  Partially Met        PT Long Term Goals - 10/29/17 1404      PT LONG TERM GOAL #1   Title  patient to be independent with advanced HEP    Time  8    Period  Weeks    Status  New    Target Date  12/24/17      PT LONG TERM GOAL #2   Title  patient to improve gait speed to >/= 2.82 ft/sec demonstrating improved functional mobility    Baseline  9/27: 2.0 ft/sec    Time  8    Period  Weeks    Status  New    Target Date  12/24/17      PT LONG TERM GOAL #3   Title  patient to improve 5x sit to stand by >/= 10 seconds    Time  8    Period  Weeks    Status  New    Target Date  12/24/17      PT LONG TERM GOAL #4   Title  patient to improve TUG by >/= 12 seconds for improved balance and mobility    Baseline  9/27: 42.79    Time  8    Period  Weeks    Status  New    Target Date  12/24/17      PT LONG TERM GOAL #5  Title  patient to report available community wellness programs available to him    Time  8    Period  Weeks    Status  New    Target Date  12/24/17            Plan - 12/02/17 1231    Clinical Impression Statement  Today's skilled session  focused on gait training and balance activities with rest breaks after each bout of amb./exercise 2/2 pt's report of muscle soreness and fatigue to ensure quality movement. Pt demonstrated progress, as he was able to reduce gait deviations with cues and progressed to static standing with feet apart and no UE support. Pt reported less muscle soreness after amb. and balance activities. Continue with POC.     Rehab Potential  Good    PT Frequency  2x / week    PT Duration  8 weeks    PT Treatment/Interventions  ADLs/Self Care Home Management;Gait training;Stair training;Functional mobility training;Therapeutic activities;Therapeutic exercise;Balance training;Patient/family education;Neuromuscular re-education;Manual techniques;Taping    PT Next Visit Plan  Ensure rest breaks after each activity to reduce muscle soreness and fatigue, and to improve quality of movement. focus on functional mobility and strength, posture work, balance activities    PT Home Exercise Plan  [id number]     Consulted and Agree with Plan of Care  Patient       Patient will benefit from skilled therapeutic intervention in order to improve the following deficits and impairments:  Abnormal gait, Impaired sensation, Decreased mobility, Decreased coordination, Decreased activity tolerance, Decreased strength, Decreased balance, Difficulty walking  Visit Diagnosis: Unsteadiness on feet  Other abnormalities of gait and mobility  Other symptoms and signs involving the nervous system     Problem List Patient Active Problem List   Diagnosis Date Noted  . Healthcare maintenance 10/26/2017  . Family history of cancer 04/06/2017  . Stasis dermatitis of both legs 11/03/2016  . Dysuria 09/22/2016  . Atypical chest pain 05/05/2016  . Nausea without vomiting 03/12/2016  . Basal cell carcinoma of skin 01/15/2016  . Postherpetic neuralgia 01/15/2016  . 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 Physical deconditioning  12/18/2015  . Kidney stones 12/06/2015  . 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 Macrocytic anemia 11/27/2015  . Lymphoproliferative disorder ([place]) 11/27/2015  . Dyspnea 11/27/2015  . 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 Frequent falls 11/27/2015  . Deficiency anemia 11/17/2015  . Cracking skin 10/02/2015  . Advance care planning 08/17/2014  . Shingles 07/18/2014  . LFT elevation 08/13/2013  . Edema 08/13/2013  . 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 Medicare annual wellness visit, subsequent 12/18/2011  . CIDP (chronic inflammatory demyelinating polyneuropathy) ([place]) 05/18/2011  . 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 Long term systemic steroid user 05/18/2011  . Hyperglycemia, drug-induced 06/05/2010  . VASOVAGAL SYNCOPE 08/05/2007  . Essential hypertension 10/06/2006  . SYSTOLIC MURMUR 85/46/2703    Oddie Bottger L 12/02/2017, 12:37 PM  [facility] [address] [place], [place], [address] Phone: [phone]   Fax:  [phone]  Name: [name] MRN: [id number] Date of Birth: 1940-05-11  Geoffry Paradise, PT,DPT 12/02/17 12:37 PM Phone: [phone] Fax: [phone]

## 2017-12-07 ENCOUNTER — Ambulatory Visit: Payer: Medicare Other | Attending: Hematology and Oncology

## 2017-12-07 DIAGNOSIS — R2689 Other abnormalities of gait and mobility: Secondary | ICD-10-CM | POA: Diagnosis present

## 2017-12-07 DIAGNOSIS — M6281 Muscle weakness (generalized): Secondary | ICD-10-CM | POA: Insufficient documentation

## 2017-12-07 DIAGNOSIS — R29818 Other symptoms and signs involving the nervous system: Secondary | ICD-10-CM | POA: Diagnosis present

## 2017-12-07 DIAGNOSIS — R2681 Unsteadiness on feet: Secondary | ICD-10-CM | POA: Insufficient documentation

## 2017-12-07 NOTE — Therapy (Signed)
[facility] [address] [place], [place], [address] Phone: [phone]   Fax:  [phone]  Physical Therapy Treatment  Patient Details  Name: [name] MRN: [id number] Date of Birth: [birth year] Referring Provider (PT): Dr. Heath Lark   Encounter Date: 12/07/2017  PT End of Session - 12/07/17 1101    Visit Number  9    Number of Visits  17    Date for PT Re-Evaluation  12/24/17    Authorization Type  [employer] Medicare    PT Start Time  1015    PT Stop Time  1057    PT Time Calculation (min)  42 min    Equipment Utilized During Treatment  Gait belt    Activity Tolerance  Patient tolerated treatment well    Behavior During Therapy  WFL for tasks assessed/performed       Past Medical History:  Diagnosis Date  . Basal cell carcinoma of skin    skin cancer, resected by derm (Dr. Evorn Gong)  . Cholelithiasis 01/95   via abdominal ultrasound  . 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 Diverticulosis of colon 01/95  . History of kidney stones   . Hyperglycemia    Related to chronic steroid use  . Hypertension 08/07  . Nephrolithiasis 1993   X 2  . Polyneuropathy     Chronic inflammatory demylenating - Prednisone per [facility]  . Syncope 08/05/07   Presumed hypotension    Past Surgical History:  Procedure Laterality Date  . CHOLECYSTECTOMY  10/27/99   Hassell Done)  . DOPPLER ECHOCARDIOGRAPHY  08/01   T.R.; T.R.; P/R. borderline LVH  . ESOPHAGOGASTRODUODENOSCOPY  12/19/05   gastritis; H.H.  . HERNIA REPAIR  06/17/04   Laparoscopic, bilateral  . [place]  . LUMBAR DISC SURGERY  2000   L3/4 rupture  . MOHS SURGERY Left 01/09/2016   left side of nose  . nephrolithiasis      There were no vitals filed for this visit.  Subjective Assessment - 12/07/17 1019    Subjective  Pt denied falls or changes since last visit. He has been performing HEP.     Patient is accompained  by:  Family member    Pertinent History  Large granular lymphocytic leukemia, CIDP (chronic inflammatory demyelinating polyneuropathy), Steroid-induced myopathy, HTN, frequent falls, MGUS (monoclonal gammopathy of unknown significance)    Patient Stated Goals  "I want to be active, walk, and move around"    Currently in Pain?  No/denies                       [facility] Adult PT Treatment/Exercise - 12/07/17 1048      Ambulation/Gait   Ambulation/Gait  Yes    Ambulation/Gait Assistance  5: Supervision    Ambulation/Gait Assistance Details  Cues to stay within RW, improve upright posture and to control B knee ext in stance.     Ambulation Distance (Feet)  75 Feet   230' and 75'   Assistive device  Rolling walker    Gait Pattern  Step-through pattern;Decreased stride length;Decreased hip/knee flexion - right;Decreased hip/knee flexion - left;Decreased dorsiflexion - right;Decreased dorsiflexion - left;Trunk flexed;Ataxic    Ambulation Surface  Level;Indoor          Balance Exercises - 12/07/17 1046      Balance Exercises: Seated   Other Seated Exercises  Performed on blue physioball (85cm): B shoulder flexion to 90 degrees, B scapular retraction,  B hip marches with TrA activations, all performed 2x10 reps., once with feet apart and once with feet together. All to improve posture, balance, and core strength. Intermittent UE support and R LE braced on mat. Rehab tech and PT providing min guard to min A for safety. Cues and for technique. Pt required rest breaks after each set 2/2 fatigue.           PT Short Term Goals - 11/23/17 1059      PT SHORT TERM GOAL #1   Title  patient to be independent with initial HEP    Time  4    Period  Weeks    Status  Achieved      PT SHORT TERM GOAL #2   Title  patient to improve TUG by >/= 6 seconds demonstrating improved functional mobility    Baseline  9/27: 42.79 sec    Time  4    Period  Weeks    Status  Achieved      PT  SHORT TERM GOAL #3   Title  patient to demonstrate upright posturing with gait >/= 50% of the time    Time  4    Period  Weeks    Status  Partially Met        PT Long Term Goals - 10/29/17 1404      PT LONG TERM GOAL #1   Title  patient to be independent with advanced HEP    Time  8    Period  Weeks    Status  New    Target Date  12/24/17      PT LONG TERM GOAL #2   Title  patient to improve gait speed to >/= 2.82 ft/sec demonstrating improved functional mobility    Baseline  9/27: 2.0 ft/sec    Time  8    Period  Weeks    Status  New    Target Date  12/24/17      PT LONG TERM GOAL #3   Title  patient to improve 5x sit to stand by >/= 10 seconds    Time  8    Period  Weeks    Status  New    Target Date  12/24/17      PT LONG TERM GOAL #4   Title  patient to improve TUG by >/= 12 seconds for improved balance and mobility    Baseline  9/27: 42.79    Time  8    Period  Weeks    Status  New    Target Date  12/24/17      PT LONG TERM GOAL #5   Title  patient to report available community wellness programs available to him    Time  8    Period  Weeks    Status  New    Target Date  12/24/17            Plan - 12/07/17 1101    Clinical Impression Statement  Today's skilled session focused on balance, core and postural training on physioball. Pt was able to maintain balance on physioball with either 1UE support, RLE against mat, or min A without UE/LE support. pt required rest breaks 2/2 fatigue and R hip cramps, which subsided with rest. Pt then spent last part of session amb. and continues to require cues for proper technique and to reduce gait deviations. Pt would continue to benefit from skilled PT to improve safety duirng functional mobiltity.  Rehab Potential  Good    PT Frequency  2x / week    PT Duration  8 weeks    PT Treatment/Interventions  ADLs/Self Care Home Management;Gait training;Stair training;Functional mobility training;Therapeutic  activities;Therapeutic exercise;Balance training;Patient/family education;Neuromuscular re-education;Manual techniques;Taping    PT Next Visit Plan  Progress note. Ensure rest breaks after each activity to reduce muscle soreness and fatigue, and to improve quality of movement. focus on functional mobility and strength, posture work, balance activities    PT Home Exercise Plan  [email or web address]     Consulted and Agree with Plan of Care  Patient       Patient will benefit from skilled therapeutic intervention in order to improve the following deficits and impairments:  Abnormal gait, Impaired sensation, Decreased mobility, Decreased coordination, Decreased activity tolerance, Decreased strength, Decreased balance, Difficulty walking  Visit Diagnosis: Other abnormalities of gait and mobility  Unsteadiness on feet  Other symptoms and signs involving the nervous system  Muscle weakness (generalized)     Problem List Patient Active Problem List   Diagnosis Date Noted  . Healthcare maintenance 10/26/2017  . Family history of cancer 04/06/2017  . Stasis dermatitis of both legs 11/03/2016  . Dysuria 09/22/2016  . Atypical chest pain 05/05/2016  . Nausea without vomiting 03/12/2016  . Basal cell carcinoma of skin 01/15/2016  . Postherpetic neuralgia 01/15/2016  . 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 Physical deconditioning 12/18/2015  . Kidney stones 12/06/2015  . 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 Macrocytic anemia 11/27/2015  . Lymphoproliferative disorder ([place]) 11/27/2015  . Dyspnea 11/27/2015  . 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 Frequent falls 11/27/2015  . Deficiency anemia 11/17/2015  . Cracking skin 10/02/2015  . Advance care planning 08/17/2014  . Shingles 07/18/2014  . LFT elevation 08/13/2013  . Edema 08/13/2013  . 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 Medicare annual wellness visit, subsequent 12/18/2011  . CIDP (chronic  inflammatory demyelinating polyneuropathy) ([place]) 05/18/2011  . 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 Long term systemic steroid user 05/18/2011  . Hyperglycemia, drug-induced 06/05/2010  . VASOVAGAL SYNCOPE 08/05/2007  . Essential hypertension 10/06/2006  . SYSTOLIC MURMUR 36/14/4315    Ellee Wawrzyniak L 12/07/2017, 11:05 AM  [facility] [address] [place], [place], [address] Phone: [phone]   Fax:  [phone]  Name: [name] MRN: [id number] Date of Birth: [birth year]  Geoffry Paradise, PT,DPT 12/07/17 11:05 AM Phone: [phone] Fax: [phone]

## 2017-12-09 ENCOUNTER — Ambulatory Visit: Payer: Medicare Other | Admitting: Physical Therapy

## 2017-12-09 ENCOUNTER — Encounter: Payer: Self-pay | Admitting: Physical Therapy

## 2017-12-09 DIAGNOSIS — R2689 Other abnormalities of gait and mobility: Secondary | ICD-10-CM

## 2017-12-09 DIAGNOSIS — R2681 Unsteadiness on feet: Secondary | ICD-10-CM

## 2017-12-09 DIAGNOSIS — R29818 Other symptoms and signs involving the nervous system: Secondary | ICD-10-CM

## 2017-12-09 DIAGNOSIS — M6281 Muscle weakness (generalized): Secondary | ICD-10-CM

## 2017-12-09 NOTE — Patient Instructions (Signed)
   1. Every time you sit down, try to use your legs to sit SLOWLY!  2. Stop doing your regular marching. Instead stand in front of your kitchen sink with your cabinet doors open. Hands on your walker, step up one foot and lightly tap it on the shelf under the counter. The MAIN FOCUS is the leg you are standing on and keeping the knee UNLOCKED as you lift the opposite foot to tap. Alternate legs for each tap x 20 reps.

## 2017-12-09 NOTE — Therapy (Signed)
[address] [place], [place], [address] Phone: [phone]   Fax:  [phone]  Physical Therapy Treatment  Patient Details  Name: [name] MRN: [id number] Date of Birth: 13-Oct-1940 Referring Provider (PT): Dr. Heath Lark   Encounter Date: 12/09/2017   10th visit progress note covers the period 10/29/17 to 12/09/2017   PT End of Session - 12/09/17 2021    Visit Number  10    Number of Visits  17    Date for PT Re-Evaluation  12/24/17    Authorization Type  UHC Medicare    PT Start Time  1021    PT Stop Time  1105    PT Time Calculation (min)  44 min    Equipment Utilized During Treatment  --    Activity Tolerance  Patient tolerated treatment well    Behavior During Therapy  [facility] for tasks assessed/performed       Past Medical History:  Diagnosis Date  . Basal cell carcinoma of skin    skin cancer, resected by derm (Dr. Evorn Gong)  . Cholelithiasis 01/95   via abdominal ultrasound  . 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 Diverticulosis of colon 01/95  . History of kidney stones   . Hyperglycemia    Related to chronic steroid use  . Hypertension 08/07  . Nephrolithiasis 1993   X 2  . Polyneuropathy     Chronic inflammatory demylenating - Prednisone per BTD  . Syncope 08/05/07   Presumed hypotension    Past Surgical History:  Procedure Laterality Date  . CHOLECYSTECTOMY  10/27/99   Hassell Done)  . DOPPLER ECHOCARDIOGRAPHY  08/01   T.R.; T.R.; P/R. borderline LVH  . ESOPHAGOGASTRODUODENOSCOPY  12/19/05   gastritis; H.H.  . HERNIA REPAIR  06/17/04   Laparoscopic, bilateral  . [place]  . LUMBAR DISC SURGERY  2000   L3/4 rupture  . MOHS SURGERY Left 01/09/2016   left side of nose  . nephrolithiasis      There were no vitals filed for this visit.  Subjective Assessment - 12/09/17 1025    Subjective  Pt denied falls or changes since  last visit. He has been performing HEP every day. Reports he has never tried dorsiflexion assist of any type.    Patient is accompained by:  Family member    Pertinent History  Large granular lymphocytic leukemia, CIDP (chronic inflammatory demyelinating polyneuropathy), Steroid-induced myopathy, HTN, frequent falls, MGUS (monoclonal gammopathy of unknown significance)    Patient Stated Goals  "I want to be active, walk, and move around"    Currently in Pain?  No/denies                       [facility] Adult PT Treatment/Exercise - 12/09/17 1912      Transfers   Transfers  Sit to Stand;Stand to Sit    Sit to Stand  4: Min guard;With upper extremity assist;From bed;From chair/3-in-1;With armrests    Sit to Stand Details (indicate cue type and reason)  heavy reliance on pressing back of legs against the bed to leverage himself up to stand and maintain balance as transitions hands from surface to RW    Stand to Sit  5: Supervision;With upper extremity assist;With armrests;To elevated surface;To bed;To chair/3-in-1;Uncontrolled descent    Stand to Sit Details  educated on technique to strengthen legs and control descent    Number of Reps  --  5 reps     Ambulation/Gait   Ambulation/Gait Assistance  5: Supervision    Ambulation/Gait Assistance Details  vc to minimize bil knee hyperextension, closer to RW with lesser reliance on bil UEs; pt reports h/o near knee buckling and has walked with hyperextension for some time    Ambulation Distance (Feet)  80 Feet   100, 50, 50   Assistive device  Rolling walker    Gait Pattern  Step-through pattern;Decreased stride length;Decreased dorsiflexion - right;Decreased dorsiflexion - left;Trunk flexed;Right steppage;Left steppage;Right foot flat;Left foot flat;Right genu recurvatum;Left genu recurvatum    Ambulation Surface  Level;Indoor      Exercises   Exercises  Knee/Hip    Other Exercises   Overview of HEP with pt reporting he does seated  hamstring stretches and expressed concern re: overstretching posterior knee structures leading to joint instability, pain and incr fall risk      Knee/Hip Exercises: Aerobic   Nustep  x 4 minutes L1.5 x 4 extremities; discussed purpose of aerobic actvity (for >=20 minutes, minimum of 3x/week) and benefits; also benefits of recumbent stepper or cycle for LE endurance and improved strength, circulation      Knee/Hip Exercises: Standing   Other Standing Knee Exercises  step taps with RW up to 6" step, alternating with focus on stance knee "unlocked" for knee control and strength; x 20 reps; then to kitchen counter/sink to demonstrate one way to replicate at home      Knee/Hip Exercises: Seated   Knee/Hip Flexion  pt inquired if he should still do seated hip flexion exercise after added standing toe taps; explained likely rationale for seated hip flexion and he admitted he has trouble lifting his legs into his daughter's SUV             PT Education - 12/09/17 1102    Education Details  working on keeping knees unlocked to not overstretch the posterior knee structures; updated his standing marching for HEP    Person(s) Educated  Patient    Methods  Explanation;Demonstration;Handout    Comprehension  Verbalized understanding;Returned demonstration       PT Short Term Goals - 11/23/17 1059      PT SHORT TERM GOAL #1   Title  patient to be independent with initial HEP    Time  4    Period  Weeks    Status  Achieved      PT SHORT TERM GOAL #2   Title  patient to improve TUG by >/= 6 seconds demonstrating improved functional mobility    Baseline  9/27: 42.79 sec    Time  4    Period  Weeks    Status  Achieved      PT SHORT TERM GOAL #3   Title  patient to demonstrate upright posturing with gait >/= 50% of the time    Time  4    Period  Weeks    Status  Partially Met        PT Long Term Goals - 10/29/17 1404      PT LONG TERM GOAL #1   Title  patient to be independent with  advanced HEP    Time  8    Period  Weeks    Status  New    Target Date  12/24/17      PT LONG TERM GOAL #2   Title  patient to improve gait speed to >/= 2.82 ft/sec demonstrating improved functional mobility    Baseline  9/27: 2.0 ft/sec    Time  8    Period  Weeks    Status  New    Target Date  12/24/17      PT LONG TERM GOAL #3   Title  patient to improve 5x sit to stand by >/= 10 seconds    Time  8    Period  Weeks    Status  New    Target Date  12/24/17      PT LONG TERM GOAL #4   Title  patient to improve TUG by >/= 12 seconds for improved balance and mobility    Baseline  9/27: 42.79    Time  8    Period  Weeks    Status  New    Target Date  12/24/17      PT LONG TERM GOAL #5   Title  patient to report available community wellness programs available to him    Time  8    Period  Weeks    Status  New    Target Date  12/24/17            Plan - 12/09/17 2026    Clinical Impression Statement  Session focused on gait training, transfer training, strength training (with focus on bil knee control) and education related to exercise, safe ambulation, and joint protection. Patient with overall good tolerance throughout session with only brief seated rest periods. Discussed pt's progress and he feels he has overall seen improvement in his strength,  stamina, and walking. Will continue to benefit from PT to work towards [place].     Rehab Potential  Good    PT Frequency  2x / week    PT Duration  8 weeks    PT Treatment/Interventions  ADLs/Self Care Home Management;Gait training;Stair training;Functional mobility training;Therapeutic activities;Therapeutic exercise;Balance training;Patient/family education;Neuromuscular re-education;Manual techniques;Taping    PT Next Visit Plan  ?try foot up brace vs AFO (?can reduce steppage gait and energy expenditure); Continue strengthening hamstrings for joint protection and quads for function; Ensure rest breaks after each activity to  reduce muscle soreness and fatigue, and to improve quality of movement. focus on functional mobility and strength, posture work, balance activities    PT Home Exercise Plan  [email or web address]     Consulted and Agree with Plan of Care  Patient       Patient will benefit from skilled therapeutic intervention in order to improve the following deficits and impairments:  Abnormal gait, Impaired sensation, Decreased mobility, Decreased coordination, Decreased activity tolerance, Decreased strength, Decreased balance, Difficulty walking  Visit Diagnosis: Other abnormalities of gait and mobility  Unsteadiness on feet  Muscle weakness (generalized)  Other symptoms and signs involving the nervous system     Problem List Patient Active Problem List   Diagnosis Date Noted  . Healthcare maintenance 10/26/2017  . Family history of cancer 04/06/2017  . Stasis dermatitis of both legs 11/03/2016  . Dysuria 09/22/2016  . Atypical chest pain 05/05/2016  . Nausea without vomiting 03/12/2016  . Basal cell carcinoma of skin 01/15/2016  . Postherpetic neuralgia 01/15/2016  . 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 Physical deconditioning 12/18/2015  . Kidney stones 12/06/2015  . 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 Macrocytic anemia 11/27/2015  . Lymphoproliferative disorder ([place]) 11/27/2015  . Dyspnea 11/27/2015  . 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 Frequent falls 11/27/2015  . Deficiency anemia 11/17/2015  . Cracking skin 10/02/2015  . Advance care planning 08/17/2014  .  Shingles 07/18/2014  . LFT elevation 08/13/2013  . Edema 08/13/2013  . 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 Medicare annual wellness visit, subsequent 12/18/2011  . CIDP (chronic inflammatory demyelinating polyneuropathy) ([place]) 05/18/2011  . 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 Long term systemic steroid user 05/18/2011  . Hyperglycemia, drug-induced 06/05/2010  . VASOVAGAL SYNCOPE 08/05/2007  .  Essential hypertension 10/06/2006  . SYSTOLIC MURMUR 79/39/6886    Rexanne Mano, PT 12/09/2017, 8:37 PM  [facility] [address] [place], [place], [address] Phone: [phone]   Fax:  [phone]  Name: [name] MRN: [id number] Date of Birth: 09-Jun-1940

## 2017-12-15 ENCOUNTER — Ambulatory Visit: Payer: Medicare Other | Admitting: Rehabilitative and Restorative Service Providers"

## 2017-12-17 ENCOUNTER — Encounter: Payer: Self-pay | Admitting: Physical Therapy

## 2017-12-17 ENCOUNTER — Ambulatory Visit: Payer: Medicare Other | Admitting: Physical Therapy

## 2017-12-17 DIAGNOSIS — M6281 Muscle weakness (generalized): Secondary | ICD-10-CM

## 2017-12-17 DIAGNOSIS — R2689 Other abnormalities of gait and mobility: Secondary | ICD-10-CM | POA: Diagnosis not present

## 2017-12-17 NOTE — Patient Instructions (Signed)
Access Code: [email or web address]  URL: [email or web address]  Date: 12/17/2017  Prepared by: Barry Brunner   Exercises  Seated Scapular Retraction - 10 reps - 2 sets - 1x daily - 7x weekly  Seated Cervical Retraction - 10 reps - 2 sets - 1x daily - 7x weekly  Shoulder Horizontal Abduction - Thumbs Up - 10 reps - 1-2 sets - 1x daily - 7x weekly  Standing Shoulder External Rotation with Resistance - 10 reps - 2 sets - 1x daily - 7x weekly  Standing March with Counter Support - 10 reps - 2 sets - 1x daily - 7x weekly  Standing Thoracic Rotation with [place] - 10 reps - 1-2 sets - 1x daily - 7x weekly  Supine Hip Flexor Stretch with Weight - 3 reps - 2 sets - 30-60 hold - 1x daily - 7x weekly   ADDED TODAY Seated Knee Extension with Anchored Resistance - 10 reps - 1 sets - - hold - 2x daily - 7x weekly  Bridge with squeezing knees together - 10 reps - 2 sets - - hold - 1x daily - 7x weekly

## 2017-12-18 NOTE — Therapy (Signed)
[facility] [address] [place], [place], [address] Phone: [phone]   Fax:  [phone]  Physical Therapy Treatment  Patient Details  Name: [name] MRN: [id number] Date of Birth: 06/27/1940 Referring Provider (PT): Dr. Heath Lark   Encounter Date: 12/17/2017  PT End of Session - 12/17/17 0726    Visit Number  11    Number of Visits  17    Date for PT Re-Evaluation  12/24/17    Authorization Type  [employer] Medicare    PT Start Time  1020    PT Stop Time  1100    PT Time Calculation (min)  40 min    Activity Tolerance  Patient tolerated treatment well    Behavior During Therapy  [facility] for tasks assessed/performed       Past Medical History:  Diagnosis Date  . Basal cell carcinoma of skin    skin cancer, resected by derm (Dr. Evorn Gong)  . Cholelithiasis 01/95   via abdominal ultrasound  . 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 Diverticulosis of colon 01/95  . History of kidney stones   . Hyperglycemia    Related to chronic steroid use  . Hypertension 08/07  . Nephrolithiasis 1993   X 2  . Polyneuropathy     Chronic inflammatory demylenating - Prednisone per FUX  . Syncope 08/05/07   Presumed hypotension    Past Surgical History:  Procedure Laterality Date  . CHOLECYSTECTOMY  10/27/99   Hassell Done)  . DOPPLER ECHOCARDIOGRAPHY  08/01   T.R.; T.R.; P/R. borderline LVH  . ESOPHAGOGASTRODUODENOSCOPY  12/19/05   gastritis; H.H.  . HERNIA REPAIR  06/17/04   Laparoscopic, bilateral  . [place]  . LUMBAR DISC SURGERY  2000   L3/4 rupture  . MOHS SURGERY Left 01/09/2016   left side of nose  . nephrolithiasis      There were no vitals filed for this visit.  Subjective Assessment - 12/17/17 1022    Subjective  Heard a pop in his rt forearm when pushing himself up and having pain thru forearm and up above elbow. Attempted exercise added to HEP last session  (step taps for SLS) and did not feel safe--he modified to do mini-squats facing counter. Not sure if further PT will be beneficial "I'm going to keep getting worse, that's just the way it is."    Patient is accompained by:  Family member    Pertinent History  Large granular lymphocytic leukemia, CIDP (chronic inflammatory demyelinating polyneuropathy), Steroid-induced myopathy, HTN, frequent falls, MGUS (monoclonal gammopathy of unknown significance)    Patient Stated Goals  "I want to be active, walk, and move around"    Currently in Pain?  Yes    Pain Score  2     Pain Location  Arm    Pain Orientation  Right    Pain Descriptors / Indicators  Radiating;Discomfort    Pain Type  Acute pain    Pain Radiating Towards  from wrist through forearm to mid-upper arm    Pain Onset  In the past 7 days    Pain Frequency  Intermittent    Aggravating Factors   pushing from sit to stand    Pain Relieving Factors  rest           Assessment- RUE pt with pain in elbow to mid humerus with resisted triceps (can produce 5/5 contraction) and no pain with resisted wrist extension or wrist flexion. ?source  of the pop he felt. Able to continue to use RUE functionally             [facility] Adult PT Treatment/Exercise - 12/17/17 1700      Transfers   Transfers  Sit to Stand;Stand to Sit    Sit to Stand  6: Modified independent (Device/Increase time);From elevated surface;With upper extremity assist    Sit to Stand Details (indicate cue type and reason)  table elevated to reduce strain on RUE     Stand to Sit  5: Supervision;With upper extremity assist;To elevated surface    Stand to Sit Details  vc to control descent with legs as much as possible and minimize use of UEs    Comments  attempted intiating transfer (to lift buttocks off surface) with forward lean and UE support on armrests of chair across from him. Required repeated elevating of mat with pt finally able to lift off when hips ~8" above knees  and VERY difficult for pt.       Ambulation/Gait   Ambulation/Gait Assistance  5: Supervision    Ambulation/Gait Assistance Details  vc to maximize use of quads by contolling knee hyperextension and keeping hands under shoulders on RW for safety    Ambulation Distance (Feet)  80 Feet    Assistive device  Rolling walker    Gait Pattern  Step-through pattern;Decreased stride length;Decreased dorsiflexion - right;Decreased dorsiflexion - left;Trunk flexed;Right steppage;Left steppage;Right foot flat;Left foot flat;Right genu recurvatum;Left genu recurvatum    Ambulation Surface  Level;Indoor      Knee/Hip Exercises: Seated   Long Arc Quad  Strengthening;Both;10 reps   green band just below knees (above skin problems)     Knee/Hip Exercises: Supine   Bridges  Strengthening;Both;3 sets;5 reps    Bridges Limitations  instruction in setting core prior to lift; 5 reps regular then added ball between knees to squeeze as lifting (otherwise pt abducting legs as lifting)    Other Supine Knee/Hip Exercises  hooklying, set core, bent knee hip flexion alternating x 10 reps             PT Education - 12/18/17 0724    Education Details  possible braces to protect knees--likely custom made due to quad weakness and potential for buckling (pt not interested); updates to HEP    Person(s) Educated  Patient;Spouse    Methods  Explanation;Demonstration;Verbal cues;Handout    Comprehension  Verbalized understanding;Returned demonstration;Verbal cues required       PT Short Term Goals - 11/23/17 1059      PT SHORT TERM GOAL #1   Title  patient to be independent with initial HEP    Time  4    Period  Weeks    Status  Achieved      PT SHORT TERM GOAL #2   Title  patient to improve TUG by >/= 6 seconds demonstrating improved functional mobility    Baseline  9/27: 42.79 sec    Time  4    Period  Weeks    Status  Achieved      PT SHORT TERM GOAL #3   Title  patient to demonstrate upright  posturing with gait >/= 50% of the time    Time  4    Period  Weeks    Status  Partially Met        PT Long Term Goals - 10/29/17 1404      PT LONG TERM GOAL #1   Title  patient to be  independent with advanced HEP    Time  8    Period  Weeks    Status  New    Target Date  12/24/17      PT LONG TERM GOAL #2   Title  patient to improve gait speed to >/= 2.82 ft/sec demonstrating improved functional mobility    Baseline  9/27: 2.0 ft/sec    Time  8    Period  Weeks    Status  New    Target Date  12/24/17      PT LONG TERM GOAL #3   Title  patient to improve 5x sit to stand by >/= 10 seconds    Time  8    Period  Weeks    Status  New    Target Date  12/24/17      PT LONG TERM GOAL #4   Title  patient to improve TUG by >/= 12 seconds for improved balance and mobility    Baseline  9/27: 42.79    Time  8    Period  Weeks    Status  New    Target Date  12/24/17      PT LONG TERM GOAL #5   Title  patient to report available community wellness programs available to him    Time  8    Period  Weeks    Status  New    Target Date  12/24/17            Plan - 12/17/17 1700    Clinical Impression Statement  Session focused on finalizing HEP for strength training and transfer training. Educated in rationale for continued exercise despite expectation for progressive polyneuropathy and pt verbalized understanding. Patient has to cancel one appointment and therefore next appt will be his last. Will assess progress towards LTGs.     Rehab Potential  Good    PT Frequency  2x / week    PT Duration  8 weeks    PT Treatment/Interventions  ADLs/Self Care Home Management;Gait training;Stair training;Functional mobility training;Therapeutic activities;Therapeutic exercise;Balance training;Patient/family education;Neuromuscular re-education;Manual techniques;Taping    PT Next Visit Plan  check LTGs and discharge; if time, continue strengthening hamstrings for joint protection and quads  for function; focus on functional mobility and strength, posture work, balance activities    PT [place]  [id number]     Consulted and Agree with Plan of Care  Patient       Patient will benefit from skilled therapeutic intervention in order to improve the following deficits and impairments:  Abnormal gait, Impaired sensation, Decreased mobility, Decreased coordination, Decreased activity tolerance, Decreased strength, Decreased balance, Difficulty walking  Visit Diagnosis: Muscle weakness (generalized)     Problem List Patient Active Problem List   Diagnosis Date Noted  . Healthcare maintenance 10/26/2017  . Family history of cancer 04/06/2017  . Stasis dermatitis of both legs 11/03/2016  . Dysuria 09/22/2016  . Atypical chest pain 05/05/2016  . Nausea without vomiting 03/12/2016  . Basal cell carcinoma of skin 01/15/2016  . Postherpetic neuralgia 01/15/2016  . 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 Physical deconditioning 12/18/2015  . Kidney stones 12/06/2015  . 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 Macrocytic anemia 11/27/2015  . Lymphoproliferative disorder ([place]) 11/27/2015  . Dyspnea 11/27/2015  . 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 Frequent falls 11/27/2015  . Deficiency anemia 11/17/2015  . Cracking skin 10/02/2015  . Advance care planning 08/17/2014  . Shingles 07/18/2014  .  LFT elevation 08/13/2013  . Edema 08/13/2013  . 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 Medicare annual wellness visit, subsequent 12/18/2011  . CIDP (chronic inflammatory demyelinating polyneuropathy) ([place]) 05/18/2011  . 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 Long term systemic steroid user 05/18/2011  . Hyperglycemia, drug-induced 06/05/2010  . VASOVAGAL SYNCOPE 08/05/2007  . Essential hypertension 10/06/2006  . SYSTOLIC MURMUR 63/84/5364    Rexanne Mano, PT 12/18/2017, 7:36 AM  [facility] [address] [place], [place], [address] Phone: [phone]   Fax:  [phone]  Name: TAESHAWN HELFMAN MRN: [id number] Date of Birth: [birth year]

## 2017-12-20 ENCOUNTER — Ambulatory Visit: Payer: Medicare Other | Admitting: Rehabilitative and Restorative Service Providers"

## 2017-12-22 ENCOUNTER — Ambulatory Visit: Payer: Medicare Other | Admitting: Rehabilitative and Restorative Service Providers"

## 2017-12-22 DIAGNOSIS — R2689 Other abnormalities of gait and mobility: Secondary | ICD-10-CM | POA: Diagnosis not present

## 2017-12-22 DIAGNOSIS — M6281 Muscle weakness (generalized): Secondary | ICD-10-CM

## 2017-12-22 DIAGNOSIS — R2681 Unsteadiness on feet: Secondary | ICD-10-CM

## 2017-12-22 DIAGNOSIS — R29818 Other symptoms and signs involving the nervous system: Secondary | ICD-10-CM

## 2017-12-22 NOTE — Therapy (Signed)
[facility] [address] [place], [place], [address] Phone: [phone]   Fax:  [phone]  Physical Therapy Treatment and Discharge Summary  Patient Details  Name: [name] MRN: [id number] Date of Birth: Jun 16, 1940 Referring Provider (PT): Dr. Heath Lark   Encounter Date: 12/22/2017  PT End of Session - 12/22/17 1035    Visit Number  12    Number of Visits  17    Date for PT Re-Evaluation  12/24/17    Authorization Type  [employer]HC Medicare    PT Start Time  1023    PT Stop Time  1104    PT Time Calculation (min)  41 min    Activity Tolerance  Patient tolerated treatment well    Behavior During Therapy  [facility] for tasks assessed/performed       Past Medical History:  Diagnosis Date  . Basal cell carcinoma of skin    skin cancer, resected by derm (Dr. Evorn Gong)  . Cholelithiasis 01/95   via abdominal ultrasound  . 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 Diverticulosis of colon 01/95  . History of kidney stones   . Hyperglycemia    Related to chronic steroid use  . Hypertension 08/07  . Nephrolithiasis 1993   X 2  . Polyneuropathy     Chronic inflammatory demylenating - Prednisone per [facility]  . Syncope 08/05/07   Presumed hypotension    Past Surgical History:  Procedure Laterality Date  . CHOLECYSTECTOMY  10/27/99   Hassell Done)  . DOPPLER ECHOCARDIOGRAPHY  08/01   T.R.; T.R.; P/R. borderline LVH  . ESOPHAGOGASTRODUODENOSCOPY  12/19/05   gastritis; H.H.  . HERNIA REPAIR  06/17/04   Laparoscopic, bilateral  . [place]  . LUMBAR DISC SURGERY  2000   L3/4 rupture  . MOHS SURGERY Left 01/09/2016   left side of nose  . nephrolithiasis      There were no vitals filed for this visit.  Subjective Assessment - 12/22/17 1035    Subjective  The patient reports that he cannot do modified exercise trying to stand on one foot and place the opposite foot on the cabinet  floor.  PT inquired about prior use of AFOs and he demonstrates his skin conditions on bilateral anterior distal LEs.     Pertinent History  Large granular lymphocytic leukemia, CIDP (chronic inflammatory demyelinating polyneuropathy), Steroid-induced myopathy, HTN, frequent falls, MGUS (monoclonal gammopathy of unknown significance)    Patient Stated Goals  "I want to be active, walk, and move around"         [facility] PT Assessment - 12/22/17 1038      Transfers   Transfers  Sit to Stand;Stand to Sit    Sit to Stand  6: Modified independent (Device/Increase time)    Five time sit to stand comments   47.3 seconds with UE support    Stand to Sit  6: Modified independent (Device/Increase time)      Ambulation/Gait   Ambulation/Gait Assistance Details  PT performed objective measures for goal status.                   [facility] Adult PT Treatment/Exercise - 12/22/17 1038      Ambulation/Gait   Ambulation/Gait  Yes    Ambulation/Gait Assistance  5: Supervision    Ambulation Distance (Feet)  80 Feet    Assistive device  Rolling walker    Gait Pattern  Step-through pattern;Decreased stride length;Decreased dorsiflexion - right;Decreased  dorsiflexion - left;Trunk flexed;Right steppage;Left steppage;Right foot flat;Left foot flat;Right genu recurvatum;Left genu recurvatum    Ambulation Surface  Level;Indoor    Gait velocity  2.28 ft/sec      Self-Care   Self-Care  Other Self-Care Comments    Other Self-Care Comments   PT and patient discussed  community exercise and PT recommended try silver sneakers classes from seated level (chair yoga and strength/conditioning).       Exercises   Exercises  Knee/Hip      Knee/Hip Exercises: Seated   Hamstring Curl  Strengthening;Right;Left;10 reps    Hamstring Limitations  blue theraband               PT Short Term Goals - 11/23/17 1059      PT SHORT TERM GOAL #1   Title  patient to be independent with initial HEP    Time  4     Period  Weeks    Status  Achieved      PT SHORT TERM GOAL #2   Title  patient to improve TUG by >/= 6 seconds demonstrating improved functional mobility    Baseline  9/27: 42.79 sec    Time  4    Period  Weeks    Status  Achieved      PT SHORT TERM GOAL #3   Title  patient to demonstrate upright posturing with gait >/= 50% of the time    Time  4    Period  Weeks    Status  Partially Met        PT Long Term Goals - 12/22/17 1037      PT LONG TERM GOAL #1   Title  patient to be independent with advanced HEP    Time  8    Period  Weeks    Status  Achieved      PT LONG TERM GOAL #2   Title  patient to improve gait speed to >/= 2.82 ft/sec demonstrating improved functional mobility    Baseline  9/27: 2.0 ft/sec; 2.28 ft/sec on 12/22/2017    Time  8    Period  Weeks    Status  Not Met      PT LONG TERM GOAL #3   Title  patient to improve 5x sit to stand by >/= 10 seconds    Baseline  47.3 seconds    Time  8    Period  Weeks    Status  Achieved      PT LONG TERM GOAL #4   Title  patient to improve TUG by >/= 12 seconds for improved balance and mobility    Baseline  9/27: 42.79; 23.75 seconds on 12/22/2017    Time  8    Period  Weeks    Status  Achieved      PT LONG TERM GOAL #5   Title  patient to report available community wellness programs available to him    Baseline  PT and patient discussed silver sneakers and chair yoga or chair classes    Time  8    Period  Weeks    Status  Achieved            Plan - 12/22/17 1435    Clinical Impression Statement  The patient met 4 LTGs.  He made minimal improvement in gait speed, but did not meet LTG.  PT recommended patient continue HEP and discussed community exercise options.    PT Treatment/Interventions  ADLs/Self Care Home  Management;Gait training;Stair training;Functional mobility training;Therapeutic activities;Therapeutic exercise;Balance training;Patient/family education;Neuromuscular re-education;Manual  techniques;Taping    PT Next Visit Plan  discharge    Consulted and Agree with Plan of Care  Patient       Patient will benefit from skilled therapeutic intervention in order to improve the following deficits and impairments:  Abnormal gait, Impaired sensation, Decreased mobility, Decreased coordination, Decreased activity tolerance, Decreased strength, Decreased balance, Difficulty walking  Visit Diagnosis: Muscle weakness (generalized)  Other abnormalities of gait and mobility  Unsteadiness on feet  Other symptoms and signs involving the nervous system  PHYSICAL THERAPY DISCHARGE SUMMARY  Visits from Start of Care: 12  Current functional level related to goals / functional outcomes: See above   Remaining deficits: Chronic weakness from CIDP Fall risk Gait deficits due to compensatory strategies due to weakness   Education / Equipment: Home program, community exercise, safety  Plan: Patient agrees to discharge.  Patient goals were partially met. Patient is being discharged due to meeting the stated rehab goals.  ?????         Thank you for the referral of this patient. Rudell Cobb, MPT   Aadi Bordner 12/22/2017, 2:41 PM  [facility] [address] [place], [place], [address] Phone: [phone]   Fax:  [phone]  Name: [name] MRN: [id number] Date of Birth: 29-Jun-1940

## 2017-12-27 ENCOUNTER — Encounter: Payer: Medicare Other | Admitting: Physical Therapy

## 2018-01-10 ENCOUNTER — Telehealth: Payer: Self-pay

## 2018-01-10 ENCOUNTER — Telehealth: Payer: Self-pay | Admitting: *Deleted

## 2018-01-10 ENCOUNTER — Telehealth: Payer: Self-pay | Admitting: Hematology and Oncology

## 2018-01-10 NOTE — Telephone Encounter (Signed)
Most of his labs won't be back on same day as visit Would be nice if he can get labs done a week ahead of time If not possible that is OK to leave everything as is

## 2018-01-10 NOTE — Telephone Encounter (Signed)
Voicemail requesting return call revealed "reason for tomorrow's appointments; need to reschedule he has no transportation.  Transportation is not a problem but our daughter's child is sick and she cannot bring him tomorrow as usual.  No one called to confirm the appointment"

## 2018-01-10 NOTE — Telephone Encounter (Signed)
Called back and given below message. She verbalized understanding. Scheduling message sent.

## 2018-01-10 NOTE — Telephone Encounter (Signed)
Wife called and left a message to call her regarding tomorrows appt.  Called back. Wife needs to cancel tomorrows appt. Daughter usually bring Mr. [name] and she is not available the month December. They will keep 1/21 appt as scheduled, unless Dr. Alvy Bimler wants to see him earlier in January.

## 2018-01-10 NOTE — Telephone Encounter (Signed)
Scheduled appt per 12/9 sch message - pt wife is aware of appt date and time

## 2018-01-11 ENCOUNTER — Ambulatory Visit: Payer: Medicare Other | Admitting: Hematology and Oncology

## 2018-01-11 ENCOUNTER — Other Ambulatory Visit: Payer: Medicare Other

## 2018-01-12 ENCOUNTER — Other Ambulatory Visit: Payer: Self-pay | Admitting: Hematology and Oncology

## 2018-02-07 ENCOUNTER — Telehealth: Payer: Self-pay

## 2018-02-07 NOTE — Telephone Encounter (Signed)
Wife called and left a message to call her.  Called back. [name] has appt this Thursday with eye MD to talk about having cataract surgery. Is he going to be able to have surgery from Dr. Calton Dach standpoint?

## 2018-02-07 NOTE — Telephone Encounter (Signed)
Called and given below message. Wife verbalized understanding. 

## 2018-02-07 NOTE — Telephone Encounter (Signed)
It's consider minor risk procedure Should not be a problem from my stand point

## 2018-02-11 ENCOUNTER — Encounter: Payer: Self-pay | Admitting: Hematology and Oncology

## 2018-02-14 ENCOUNTER — Telehealth: Payer: Self-pay | Admitting: Hematology and Oncology

## 2018-02-14 NOTE — Telephone Encounter (Signed)
R/s appt per 1/13 sch message - pt wife is aware of appt change

## 2018-02-15 ENCOUNTER — Inpatient Hospital Stay: Payer: Medicare Other | Attending: Hematology and Oncology

## 2018-02-15 DIAGNOSIS — G6181 Chronic inflammatory demyelinating polyneuritis: Secondary | ICD-10-CM | POA: Insufficient documentation

## 2018-02-15 DIAGNOSIS — R600 Localized edema: Secondary | ICD-10-CM | POA: Insufficient documentation

## 2018-02-15 DIAGNOSIS — D472 Monoclonal gammopathy: Secondary | ICD-10-CM | POA: Diagnosis not present

## 2018-02-15 DIAGNOSIS — C91Z0 Other lymphoid leukemia not having achieved remission: Secondary | ICD-10-CM | POA: Insufficient documentation

## 2018-02-15 DIAGNOSIS — G720 Drug-induced myopathy: Secondary | ICD-10-CM | POA: Diagnosis not present

## 2018-02-15 DIAGNOSIS — R945 Abnormal results of liver function studies: Secondary | ICD-10-CM | POA: Insufficient documentation

## 2018-02-15 DIAGNOSIS — R739 Hyperglycemia, unspecified: Secondary | ICD-10-CM | POA: Insufficient documentation

## 2018-02-15 DIAGNOSIS — D539 Nutritional anemia, unspecified: Secondary | ICD-10-CM

## 2018-02-15 DIAGNOSIS — H269 Unspecified cataract: Secondary | ICD-10-CM | POA: Diagnosis not present

## 2018-02-15 DIAGNOSIS — Z79899 Other long term (current) drug therapy: Secondary | ICD-10-CM | POA: Diagnosis not present

## 2018-02-15 LAB — CBC WITH DIFFERENTIAL/PLATELET
Abs Immature Granulocytes: 0.01 10*3/uL (ref 0.00–0.07)
BASOS ABS: 0 10*3/uL (ref 0.0–0.1)
Basophils Relative: 1 %
Eosinophils Absolute: 0.1 10*3/uL (ref 0.0–0.5)
Eosinophils Relative: 2 %
HEMATOCRIT: 32 % — AB (ref 39.0–52.0)
HEMOGLOBIN: 10.8 g/dL — AB (ref 13.0–17.0)
IMMATURE GRANULOCYTES: 0 %
LYMPHS ABS: 1.5 10*3/uL (ref 0.7–4.0)
LYMPHS PCT: 37 %
MCH: 38.7 pg — ABNORMAL HIGH (ref 26.0–34.0)
MCHC: 33.8 g/dL (ref 30.0–36.0)
MCV: 114.7 fL — AB (ref 80.0–100.0)
MONOS PCT: 6 %
Monocytes Absolute: 0.2 10*3/uL (ref 0.1–1.0)
NEUTROS PCT: 54 %
NRBC: 0 % (ref 0.0–0.2)
Neutro Abs: 2.3 10*3/uL (ref 1.7–7.7)
Platelets: 249 10*3/uL (ref 150–400)
RBC: 2.79 MIL/uL — ABNORMAL LOW (ref 4.22–5.81)
RDW: 16.6 % — ABNORMAL HIGH (ref 11.5–15.5)
WBC: 4.2 10*3/uL (ref 4.0–10.5)

## 2018-02-15 LAB — COMPREHENSIVE METABOLIC PANEL
ALK PHOS: 104 U/L (ref 38–126)
ALT: 82 U/L — AB (ref 0–44)
AST: 63 U/L — AB (ref 15–41)
Albumin: 3.6 g/dL (ref 3.5–5.0)
Anion gap: 8 (ref 5–15)
BILIRUBIN TOTAL: 1 mg/dL (ref 0.3–1.2)
BUN: 14 mg/dL (ref 8–23)
CALCIUM: 8.5 mg/dL — AB (ref 8.9–10.3)
CO2: 24 mmol/L (ref 22–32)
CREATININE: 1.16 mg/dL (ref 0.61–1.24)
Chloride: 107 mmol/L (ref 98–111)
GFR calc Af Amer: >60 mL/min (ref >60)
Glucose, Bld: 134 mg/dL — ABNORMAL HIGH (ref 70–99)
Potassium: 4.1 mmol/L (ref 3.5–5.1)
Sodium: 139 mmol/L (ref 135–145)
TOTAL PROTEIN: 6.6 g/dL (ref 6.5–8.1)

## 2018-02-15 LAB — LACTATE DEHYDROGENASE: LDH: 217 U/L — ABNORMAL HIGH (ref 98–192)

## 2018-02-15 LAB — VITAMIN B12: Vitamin B-12: 719 pg/mL (ref 180–914)

## 2018-02-16 LAB — KAPPA/LAMBDA LIGHT CHAINS
KAPPA FREE LGHT CHN: 23 mg/L — AB (ref 3.3–19.4)
Kappa, lambda light chain ratio: 3.33 — ABNORMAL HIGH (ref 0.26–1.65)
Lambda free light chains: 6.9 mg/L (ref 5.7–26.3)

## 2018-02-16 LAB — BETA 2 MICROGLOBULIN, SERUM: Beta-2 Microglobulin: 4.7 mg/L — ABNORMAL HIGH (ref 0.6–2.4)

## 2018-02-17 LAB — MULTIPLE MYELOMA PANEL, SERUM
ALBUMIN/GLOB SERPL: 1.3 (ref 0.7–1.7)
ALPHA 1: 0.2 g/dL (ref 0.0–0.4)
Albumin SerPl Elph-Mcnc: 3.5 g/dL (ref 2.9–4.4)
Alpha2 Glob SerPl Elph-Mcnc: 0.5 g/dL (ref 0.4–1.0)
B-Globulin SerPl Elph-Mcnc: 0.6 g/dL — ABNORMAL LOW (ref 0.7–1.3)
Gamma Glob SerPl Elph-Mcnc: 1.5 g/dL (ref 0.4–1.8)
Globulin, Total: 2.9 g/dL (ref 2.2–3.9)
IGM (IMMUNOGLOBULIN M), SRM: 12 mg/dL — AB (ref 15–143)
IgA: 33 mg/dL — ABNORMAL LOW (ref 61–437)
IgG (Immunoglobin G), Serum: 1672 mg/dL — ABNORMAL HIGH (ref 700–1600)
M Protein SerPl Elph-Mcnc: 1.3 g/dL — ABNORMAL HIGH
TOTAL PROTEIN ELP: 6.4 g/dL (ref 6.0–8.5)

## 2018-02-22 ENCOUNTER — Ambulatory Visit: Payer: Medicare Other | Admitting: Hematology and Oncology

## 2018-02-22 ENCOUNTER — Other Ambulatory Visit: Payer: Medicare Other

## 2018-02-23 ENCOUNTER — Other Ambulatory Visit: Payer: Self-pay | Admitting: Hematology and Oncology

## 2018-02-24 ENCOUNTER — Telehealth: Payer: Self-pay | Admitting: Hematology and Oncology

## 2018-02-24 ENCOUNTER — Inpatient Hospital Stay (HOSPITAL_BASED_OUTPATIENT_CLINIC_OR_DEPARTMENT_OTHER): Payer: Medicare Other | Admitting: Hematology and Oncology

## 2018-02-24 ENCOUNTER — Encounter: Payer: Self-pay | Admitting: Hematology and Oncology

## 2018-02-24 DIAGNOSIS — R7989 Other specified abnormal findings of blood chemistry: Secondary | ICD-10-CM

## 2018-02-24 DIAGNOSIS — C91Z0 Other lymphoid leukemia not having achieved remission: Secondary | ICD-10-CM | POA: Diagnosis not present

## 2018-02-24 DIAGNOSIS — D472 Monoclonal gammopathy: Secondary | ICD-10-CM

## 2018-02-24 DIAGNOSIS — G720 Drug-induced myopathy: Secondary | ICD-10-CM

## 2018-02-24 DIAGNOSIS — R600 Localized edema: Secondary | ICD-10-CM

## 2018-02-24 DIAGNOSIS — G6181 Chronic inflammatory demyelinating polyneuritis: Secondary | ICD-10-CM | POA: Diagnosis not present

## 2018-02-24 DIAGNOSIS — H269 Unspecified cataract: Secondary | ICD-10-CM

## 2018-02-24 DIAGNOSIS — R945 Abnormal results of liver function studies: Secondary | ICD-10-CM

## 2018-02-24 DIAGNOSIS — R739 Hyperglycemia, unspecified: Secondary | ICD-10-CM

## 2018-02-24 DIAGNOSIS — T380X5A Adverse effect of glucocorticoids and synthetic analogues, initial encounter: Secondary | ICD-10-CM

## 2018-02-24 NOTE — Telephone Encounter (Signed)
Gave avs and calendar ° °

## 2018-02-25 ENCOUNTER — Encounter: Payer: Self-pay | Admitting: Hematology and Oncology

## 2018-02-25 DIAGNOSIS — H269 Unspecified cataract: Secondary | ICD-10-CM | POA: Insufficient documentation

## 2018-02-25 DIAGNOSIS — R600 Localized edema: Secondary | ICD-10-CM | POA: Insufficient documentation

## 2018-02-25 NOTE — Assessment & Plan Note (Signed)
The patient has gained almost 10 pounds since the last time I saw him. Upon going through his dietary intake, the patient has been drinking a lot of sweet tea on a regular basis We discussed the importance of dietary modification to prevent progression into full-blown diabetes

## 2018-02-25 NOTE — Assessment & Plan Note (Signed)
He has clear signs of steroid myopathy.  He continues doing home exercises. He had recent fall.  We discussed strategies to minimize risk of fall

## 2018-02-25 NOTE — Assessment & Plan Note (Signed)
He has worsening vision with plan for bilateral cataract surgery in the future. There is no contraindication from the hematology standpoint for him to proceed as indicated.

## 2018-02-25 NOTE — Assessment & Plan Note (Signed)
He has significant bilateral lower extremity edema due to fluid retention.  Again, we focus on dietary modification

## 2018-02-25 NOTE — Progress Notes (Signed)
[facility] OFFICE PROGRESS NOTE  Patient Care Team: Tonia Ghent, MD as PCP - General (Family Medicine) Dingeldein, Remo Lipps, MD as Referring Physician (Ophthalmology)  ASSESSMENT & PLAN:  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[place]) He tolerated methotrexate well at 12.5 mg dose weekly Except for his anemia, he appears to have responded well to treatment. Lymphocytosis had resolved I recommend continue the same dose for now  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 can be associated with autoimmune disorder He tolerated reduced dose prednisone well Currently, he takes prednisone 5 mg on Mondays, Wednesdays and Fridays and to take 10 mg for the rest of the week He has no recent flare of CIDP recently I recommend we continue the same  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 Bone marrow biopsy revealed 5-10% plasma cells, insignificant His last M protein was mildly elevated but stable Repeat myeloma panel is stable.  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  He continues doing home exercises. He had recent fall.  We discussed strategies to minimize risk of fall  LFT elevation This is multifactorial, likely due to side effects of methotrexate, fatty liver disease (confirmed on CT from 2017) and also related to recent findings of hyperlipidemia and likely fatty liver disease from chronic steroid use and obesity I recommend close follow-up and dietary modification to treat his hyperlipidemia  Steroid-induced hyperglycemia The patient has gained almost 10 pounds since the last time I saw him. Upon going through his dietary intake, the patient has been drinking a lot of sweet tea on a regular basis We discussed the importance of dietary modification to prevent progression into full-blown diabetes  Bilateral  cataracts He has worsening vision with plan for bilateral cataract surgery in the future. There is no contraindication from the hematology standpoint for him to proceed as indicated.  Bilateral edema of lower extremity He has significant bilateral lower extremity edema due to fluid retention.  Again, we focus on dietary modification   No orders of the defined types were placed in this encounter.   INTERVAL HISTORY: Please see below for problem oriented charting. He returns with his daughter and wife for further follow-up. He had one fall injury recently at home. He denies recent infection, fever or chills He denies worsening of postherpetic neuralgia.  He is planning to undergo cataract surgery in the near future and is wondering whether it is possible while he is taking his prescription steroids and methotrexate. He continues to exercise at home on a regular basis He has gained almost 10 pounds since last time I saw him. No progression of peripheral neuropathy. No new bone pain.  SUMMARY OF ONCOLOGIC HISTORY:   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[place])   12/11/2015 Bone Marrow Biopsy    Bone marrow biopsy confirmed diagnosis of Tcell LGL. T-cell receptor rearrangement study is still pending    12/19/2015 -  Chemotherapy    The patient was started on 10 mg once a week methotrexate. The dose is increased to 12.5 mg starting 01/16/16      REVIEW OF SYSTEMS:   Constitutional: Denies fevers, chills or abnormal weight loss Eyes: Denies blurriness of vision Ears, nose, mouth, throat, and face: Denies mucositis or sore throat Respiratory: Denies cough, dyspnea or wheezes Cardiovascular: Denies palpitation, chest discomfort or lower extremity swelling Gastrointestinal:  Denies nausea, heartburn or change in bowel habits Skin:  Denies abnormal skin rashes Lymphatics: Denies new lymphadenopathy or easy bruising Neurological:Denies numbness, tingling or new weaknesses Behavioral/Psych:  Mood is stable, no new changes  All other systems were reviewed with the patient and are negative.  I have reviewed the past medical history, past surgical history, social history and family history with the patient and they are unchanged from previous note.  ALLERGIES:  is allergic to sulfa antibiotics and sulfonamide derivatives.  MEDICATIONS:  Current Outpatient Medications  Medication Sig Dispense Refill  . acetaminophen (TYLENOL) 325 MG tablet Take 650 mg by mouth every 6 (six) hours as needed.    Marland Kitchen aspirin EC 81 MG tablet Take 162 mg by mouth daily.    Marland Kitchen CALCIUM CARB-CHOLECALCIFEROL PO Take 1,250 mg by mouth daily.    . Cyanocobalamin (VITAMIN B-12) 500 MCG SUBL Place 1 tablet under the tongue daily.     Marland Kitchen econazole nitrate 1 % cream Apply 1 application topically daily as needed.    . folic acid (FOLVITE) 825 MCG tablet Take 400 mcg by mouth daily.    Marland Kitchen gabapentin (NEURONTIN) 300 MG capsule Take 2 capsules (600 mg total) by mouth 2 (two) times daily. 360 capsule 3  . methotrexate (RHEUMATREX) 2.5 MG tablet TAKE 5 TABLETS (12.5 MG TOTAL) BY MOUTH ONCE A WEEK. CAUTION: CHEMOTHERAPY. PROTECT FROM LIGHT. 30 tablet 8  . Multiple Vitamin (MULTIVITAMIN) tablet Take 1 tablet by mouth daily.      . predniSONE (DELTASONE) 5 MG tablet TAKE 1 TABLET BY MOUTH ON MONDAYS, WEDS, AND FRIDAYS, AND TAKE 2 TABS OTHER DAYS OF THE WEEK 90 tablet 1  . timolol (TIMOPTIC) 0.5 % ophthalmic solution Place 1 drop into both eyes at bedtime. One drop in each eye at bedtime.    . triamcinolone (KENALOG) 0.025 % cream Apply 1 application topically as needed.     No current facility-administered medications for this visit.     PHYSICAL EXAMINATION: ECOG PERFORMANCE STATUS: 2 - Symptomatic, <50% confined to bed  Vitals:   02/24/18 1351  BP: 138/77  Pulse: 76  Resp: 18  Temp: 97.9 F (36.6 C)  SpO2: 99%   Filed Weights   02/24/18 1351  Weight: 227 lb 11.2 oz (103.3 kg)    GENERAL:alert, no distress  and comfortable.  He looks mildly cushingoid SKIN: skin color, texture, turgor are normal, no rashes or significant lesions EYES: normal, Conjunctiva are pink and non-injected, sclera clear OROPHARYNX:no exudate, no erythema and lips, buccal mucosa, and tongue normal  NECK: supple, thyroid normal size, non-tender, without nodularity LYMPH:  no palpable lymphadenopathy in the cervical, axillary or inguinal LUNGS: clear to auscultation and percussion with normal breathing effort HEART: regular rate & rhythm and no murmurs with moderate lower extremity edema ABDOMEN:abdomen soft, non-tender and normal bowel sounds Musculoskeletal:no cyanosis of digits and no clubbing  NEURO: alert & oriented x 3 with fluent speech, no focal motor/sensory deficits  LABORATORY DATA:  I have reviewed the data as listed    Component Value Date/Time   NA 139 02/15/2018 1314   NA 138 12/15/2016 1319   K 4.1 02/15/2018 1314   K 4.1 12/15/2016 1319   CL 107 02/15/2018 1314   CO2 24 02/15/2018 1314   CO2 24 12/15/2016 1319   GLUCOSE 134 (H) 02/15/2018 1314   GLUCOSE 123 12/15/2016 1319   BUN 14 02/15/2018 1314   BUN 13.7 12/15/2016 1319   CREATININE 1.16 02/15/2018 1314   CREATININE 1.2 12/15/2016 1319   CALCIUM 8.5 (L) 02/15/2018  1314   CALCIUM 8.8 12/15/2016 1319   PROT 6.6 02/15/2018 1314   PROT 6.5 12/15/2016 1319   PROT 6.2 12/15/2016 1319   ALBUMIN 3.6 02/15/2018 1314   ALBUMIN 3.5 12/15/2016 1319   AST 63 (H) 02/15/2018 1314   AST 66 (H) 12/15/2016 1319   ALT 82 (H) 02/15/2018 1314   ALT 90 (H) 12/15/2016 1319   ALKPHOS 104 02/15/2018 1314   ALKPHOS 116 12/15/2016 1319   BILITOT 1.0 02/15/2018 1314   BILITOT 1.13 12/15/2016 1319   GFRNONAA >60 02/15/2018 1314   GFRAA >60 02/15/2018 1314    No results found for: SPEP, UPEP  Lab Results  Component Value Date   WBC 4.2 02/15/2018   NEUTROABS 2.3 02/15/2018   HGB 10.8 (L) 02/15/2018   HCT 32.0 (L) 02/15/2018   MCV 114.7 (H) 02/15/2018    PLT 249 02/15/2018      Chemistry      Component Value Date/Time   NA 139 02/15/2018 1314   NA 138 12/15/2016 1319   K 4.1 02/15/2018 1314   K 4.1 12/15/2016 1319   CL 107 02/15/2018 1314   CO2 24 02/15/2018 1314   CO2 24 12/15/2016 1319   BUN 14 02/15/2018 1314   BUN 13.7 12/15/2016 1319   CREATININE 1.16 02/15/2018 1314   CREATININE 1.2 12/15/2016 1319      Component Value Date/Time   CALCIUM 8.5 (L) 02/15/2018 1314   CALCIUM 8.8 12/15/2016 1319   ALKPHOS 104 02/15/2018 1314   ALKPHOS 116 12/15/2016 1319   AST 63 (H) 02/15/2018 1314   AST 66 (H) 12/15/2016 1319   ALT 82 (H) 02/15/2018 1314   ALT 90 (H) 12/15/2016 1319   BILITOT 1.0 02/15/2018 1314   BILITOT 1.13 12/15/2016 1319      All questions were answered. The patient knows to call the clinic with any problems, questions or concerns. No barriers to learning was detected.  I spent 30 minutes counseling the patient face to face. The total time spent in the appointment was 40 minutes and more than 50% was on counseling and review of test results  Heath Lark, MD 02/25/2018 11:04 AM

## 2018-02-25 NOTE — Assessment & Plan Note (Signed)
The patient has MGUS. Bone marrow biopsy revealed 5-10% plasma cells, insignificant His last M protein was mildly elevated but stable Repeat myeloma panel is stable.  I plan to check it again once a year, next due in January 2021

## 2018-02-25 NOTE — Assessment & Plan Note (Signed)
This is multifactorial, likely due to side effects of methotrexate, fatty liver disease (confirmed on CT from 2017) and also related to recent findings of hyperlipidemia and likely fatty liver disease from chronic steroid use and obesity I recommend close follow-up and dietary modification to treat his hyperlipidemia

## 2018-02-25 NOTE — Assessment & Plan Note (Signed)
He tolerated methotrexate well at 12.5 mg dose weekly Except for his anemia, he appears to have responded well to treatment. Lymphocytosis had resolved I recommend continue the same dose for now

## 2018-02-25 NOTE — Assessment & Plan Note (Signed)
T-cell LGL can be associated with autoimmune disorder He tolerated reduced dose prednisone well Currently, he takes prednisone 5 mg on Mondays, Wednesdays and Fridays and to take 10 mg for the rest of the week He has no recent flare of CIDP recently I recommend we continue the same

## 2018-03-02 ENCOUNTER — Encounter: Payer: Self-pay | Admitting: *Deleted

## 2018-03-02 ENCOUNTER — Other Ambulatory Visit: Payer: Self-pay

## 2018-03-03 NOTE — Discharge Instructions (Signed)

## 2018-03-09 ENCOUNTER — Ambulatory Visit: Payer: Medicare Other | Admitting: Anesthesiology

## 2018-03-09 ENCOUNTER — Encounter
Admission: RE | Disposition: A | Discharge status: Home / Self Care | Payer: Self-pay | Source: Ambulatory Visit | Attending: Ophthalmology

## 2018-03-09 ENCOUNTER — Ambulatory Visit
Admission: RE | Admit: 2018-03-09 | Discharge: 2018-03-09 | Disposition: A | Discharge status: Home / Self Care | Payer: Medicare Other | Source: Ambulatory Visit | Attending: Ophthalmology | Admitting: Ophthalmology

## 2018-03-09 DIAGNOSIS — Z7952 Long term (current) use of systemic steroids: Secondary | ICD-10-CM | POA: Insufficient documentation

## 2018-03-09 DIAGNOSIS — H2511 Age-related nuclear cataract, right eye: Secondary | ICD-10-CM | POA: Diagnosis not present

## 2018-03-09 DIAGNOSIS — Z7982 Long term (current) use of aspirin: Secondary | ICD-10-CM | POA: Insufficient documentation

## 2018-03-09 DIAGNOSIS — I10 Essential (primary) hypertension: Secondary | ICD-10-CM | POA: Diagnosis not present

## 2018-03-09 DIAGNOSIS — Z79899 Other long term (current) drug therapy: Secondary | ICD-10-CM | POA: Diagnosis not present

## 2018-03-09 DIAGNOSIS — G629 Polyneuropathy, unspecified: Secondary | ICD-10-CM | POA: Diagnosis not present

## 2018-03-09 DIAGNOSIS — C9510 Chronic leukemia of unspecified cell type not having achieved remission: Secondary | ICD-10-CM | POA: Diagnosis not present

## 2018-03-09 DIAGNOSIS — Z882 Allergy status to sulfonamides status: Secondary | ICD-10-CM | POA: Insufficient documentation

## 2018-03-09 HISTORY — PX: CATARACT EXTRACTION W/PHACO: SHX586

## 2018-03-09 HISTORY — DX: Other postherpetic nervous system involvement: B02.29

## 2018-03-09 SURGERY — PHACOEMULSIFICATION, CATARACT, WITH IOL INSERTION
Anesthesia: Monitor Anesthesia Care | Site: Eye | Laterality: Right

## 2018-03-09 MED ORDER — MIDAZOLAM HCL 2 MG/2ML IJ SOLN
INTRAMUSCULAR | Status: DC | PRN
  Administered 2018-03-09: 1.5 mg via INTRAVENOUS

## 2018-03-09 MED ORDER — MOXIFLOXACIN HCL 0.5 % OP SOLN
OPHTHALMIC | Status: DC | PRN
  Administered 2018-03-09: 0.2 mL via OPHTHALMIC

## 2018-03-09 MED ORDER — NA HYALUR & NA CHOND-NA HYALUR 0.4-0.35 ML IO KIT
PACK | INTRAOCULAR | Status: DC | PRN
  Administered 2018-03-09: 1 mL via INTRAOCULAR

## 2018-03-09 MED ORDER — TETRACAINE HCL 0.5 % OP SOLN
1.0000 [drp] | OPHTHALMIC | Status: DC | PRN
  Administered 2018-03-09 (×3): 1 [drp] via OPHTHALMIC

## 2018-03-09 MED ORDER — LIDOCAINE HCL (PF) 2 % IJ SOLN
INTRAOCULAR | Status: DC | PRN
  Administered 2018-03-09: 1 mL

## 2018-03-09 MED ORDER — MOXIFLOXACIN HCL 0.5 % OP SOLN
1.0000 [drp] | OPHTHALMIC | Status: DC | PRN
  Administered 2018-03-09 (×3): 1 [drp] via OPHTHALMIC

## 2018-03-09 MED ORDER — BRIMONIDINE TARTRATE-TIMOLOL 0.2-0.5 % OP SOLN
OPHTHALMIC | Status: DC | PRN
  Administered 2018-03-09: 1 [drp] via OPHTHALMIC

## 2018-03-09 MED ORDER — FENTANYL CITRATE (PF) 100 MCG/2ML IJ SOLN
INTRAMUSCULAR | Status: DC | PRN
  Administered 2018-03-09: 50 ug via INTRAVENOUS

## 2018-03-09 MED ORDER — EPINEPHRINE PF 1 MG/ML IJ SOLN
INTRAOCULAR | Status: DC | PRN
  Administered 2018-03-09: 70 mL via OPHTHALMIC

## 2018-03-09 MED ORDER — ARMC OPHTHALMIC DILATING DROPS
1.0000 "application " | OPHTHALMIC | Status: DC | PRN
  Administered 2018-03-09 (×3): 1 via OPHTHALMIC

## 2018-03-09 SURGICAL SUPPLY — 26 items
CANNULA ANT/CHMB 27G (MISCELLANEOUS) ×1 IMPLANT
CANNULA ANT/CHMB 27GA (MISCELLANEOUS) ×3 IMPLANT
GLOVE SURG LX 7.5 STRW (GLOVE) ×2
GLOVE SURG LX STRL 7.5 STRW (GLOVE) ×1 IMPLANT
GLOVE SURG TRIUMPH 8.0 PF LTX (GLOVE) ×3 IMPLANT
GOWN STRL REUS W/ TWL LRG LVL3 (GOWN DISPOSABLE) ×2 IMPLANT
GOWN STRL REUS W/TWL LRG LVL3 (GOWN DISPOSABLE) ×4
LENS IOL TECNIS ITEC 20.5 (Intraocular Lens) ×2 IMPLANT
MARKER SKIN DUAL TIP RULER LAB (MISCELLANEOUS) ×3 IMPLANT
NDL FILTER BLUNT 18X1 1/2 (NEEDLE) ×1 IMPLANT
NDL RETROBULBAR .5 NSTRL (NEEDLE) IMPLANT
NEEDLE FILTER BLUNT 18X 1/2SAF (NEEDLE) ×2
NEEDLE FILTER BLUNT 18X1 1/2 (NEEDLE) ×1 IMPLANT
PACK CATARACT BRASINGTON (MISCELLANEOUS) ×3 IMPLANT
PACK EYE AFTER SURG (MISCELLANEOUS) ×3 IMPLANT
PACK OPTHALMIC (MISCELLANEOUS) ×3 IMPLANT
RING MALYGIN 7.0 (MISCELLANEOUS) IMPLANT
SUT ETHILON 10-0 CS-B-6CS-B-6 (SUTURE)
SUT VICRYL  9 0 (SUTURE)
SUT VICRYL 9 0 (SUTURE) IMPLANT
SUTURE EHLN 10-0 CS-B-6CS-B-6 (SUTURE) IMPLANT
SYR 3ML LL SCALE MARK (SYRINGE) ×3 IMPLANT
SYR 5ML LL (SYRINGE) ×3 IMPLANT
SYR TB 1ML LUER SLIP (SYRINGE) ×3 IMPLANT
WATER STERILE IRR 500ML POUR (IV SOLUTION) ×3 IMPLANT
WIPE NON LINTING 3.25X3.25 (MISCELLANEOUS) ×3 IMPLANT

## 2018-03-09 NOTE — H&P (Signed)

## 2018-03-09 NOTE — Op Note (Signed)
LOCATION:  [place]   PREOPERATIVE DIAGNOSIS:    Nuclear sclerotic cataract right eye. H25.11   POSTOPERATIVE DIAGNOSIS:  Nuclear sclerotic cataract right eye.     PROCEDURE:  Phacoemusification with posterior chamber intraocular lens placement of the right eye   LENS:   Implant Name Type Inv. Item Serial No. Manufacturer Lot No. LRB No. Used  LENS IOL DIOP 20.5 - [id number] Intraocular Lens LENS IOL DIOP 20.5 [id number] AMO  Right 1        ULTRASOUND TIME: 23 % of 1 minutes, 30 seconds.  CDE 20.9   SURGEON:  Wyonia Hough, MD   ANESTHESIA:  Topical with tetracaine drops and 2% Xylocaine jelly, augmented with 1% preservative-free intracameral lidocaine.    COMPLICATIONS:  None.   DESCRIPTION OF PROCEDURE:  The patient was identified in the holding room and transported to the operating room and placed in the supine position under the operating microscope.  The right eye was identified as the operative eye and it was prepped and draped in the usual sterile ophthalmic fashion.   A 1 millimeter clear-corneal paracentesis was made at the 12:00 position.  0.5 ml of preservative-free 1% lidocaine was injected into the anterior chamber. The anterior chamber was filled with Viscoat viscoelastic.  A 2.4 millimeter keratome was used to make a near-clear corneal incision at the 9:00 position.  A curvilinear capsulorrhexis was made with a cystotome and capsulorrhexis forceps.  Balanced salt solution was used to hydrodissect and hydrodelineate the nucleus.   Phacoemulsification was then used in stop and chop fashion to remove the lens nucleus and epinucleus.  The remaining cortex was then removed using the irrigation and aspiration handpiece. Provisc was then placed into the capsular bag to distend it for lens placement.  A lens was then injected into the capsular bag.  The remaining viscoelastic was aspirated.   Wounds were hydrated with balanced salt solution.  The anterior  chamber was inflated to a physiologic pressure with balanced salt solution.  No wound leaks were noted. .Vigamox 0.2 ml of a 1mg  per ml solution was injected into the anterior chamber for a dose of 0.2 mg of intracameral antibiotic at the completion of the case.   Timolol and Brimonidine drops were applied to the eye.  The patient was taken to the recovery room in stable condition without complications of anesthesia or surgery.   Kamira Mellette 03/09/2018, 1:13 PM

## 2018-03-09 NOTE — Anesthesia Procedure Notes (Signed)
Procedure Name: MAC Performed by: Jaki Hammerschmidt, CRNA Pre-anesthesia Checklist: Patient identified, Emergency Drugs available, Suction available, Timeout performed and Patient being monitored Patient Re-evaluated:Patient Re-evaluated prior to induction Oxygen Delivery Method: Nasal cannula Placement Confirmation: positive ETCO2       

## 2018-03-09 NOTE — Transfer of Care (Signed)
Immediate Anesthesia Transfer of Care Note  Patient: [name]  Procedure(s) Performed: CATARACT EXTRACTION PHACO AND INTRAOCULAR LENS PLACEMENT ([facility])  RIGHT (Right Eye)  Patient Location: PACU  Anesthesia Type: MAC  Level of Consciousness: awake, alert  and patient cooperative  Airway and Oxygen Therapy: Patient Spontanous Breathing and Patient connected to supplemental oxygen  Post-op Assessment: Post-op Vital signs reviewed, Patient's Cardiovascular Status Stable, Respiratory Function Stable, Patent Airway and No signs of Nausea or vomiting  Post-op Vital Signs: Reviewed and stable  Complications: No apparent anesthesia complications

## 2018-03-09 NOTE — Anesthesia Preprocedure Evaluation (Signed)
Anesthesia Evaluation   Patient awake    Reviewed: Allergy & Precautions, H&P , NPO status , Patient's Chart, lab work & pertinent test results  Airway Mallampati: II  TM Distance: >3 FB Neck ROM: full    Dental no notable dental hx.    Pulmonary    Pulmonary exam normal breath sounds clear to auscultation       Cardiovascular hypertension, Normal cardiovascular exam+ Valvular Problems/Murmurs  Rhythm:regular Rate:Normal     Neuro/Psych negative psych ROS   GI/Hepatic negative GI ROS, Neg liver ROS,   Endo/Other    Renal/GU      Musculoskeletal   Abdominal   Peds  Hematology  (+) Blood dyscrasia, anemia ,   Anesthesia Other Findings   Reproductive/Obstetrics negative OB ROS                             Anesthesia Physical Anesthesia Plan  ASA: II  Anesthesia Plan: MAC   Post-op Pain Management:    Induction:   PONV Risk Score and Plan:   Airway Management Planned:   Additional Equipment:   Intra-op Plan:   Post-operative Plan:   Informed Consent: I have reviewed the patients History and Physical, chart, labs and discussed the procedure including the risks, benefits and alternatives for the proposed anesthesia with the patient or authorized representative who has indicated his/her understanding and acceptance.       Plan Discussed with:   Anesthesia Plan Comments:         Anesthesia Quick Evaluation

## 2018-03-09 NOTE — Anesthesia Postprocedure Evaluation (Signed)
Anesthesia Post Note  Patient: [name]  Procedure(s) Performed: CATARACT EXTRACTION PHACO AND INTRAOCULAR LENS PLACEMENT ([facility])  RIGHT (Right Eye)  Patient location during evaluation: PACU Anesthesia Type: MAC Level of consciousness: awake and alert Pain management: pain level controlled Vital Signs Assessment: post-procedure vital signs reviewed and stable Respiratory status: spontaneous breathing Cardiovascular status: stable Anesthetic complications: no    Jaci Standard, III,  Brailyn Killion D

## 2018-04-04 ENCOUNTER — Encounter: Payer: Self-pay | Admitting: *Deleted

## 2018-04-04 ENCOUNTER — Other Ambulatory Visit: Payer: Self-pay

## 2018-04-07 NOTE — Discharge Instructions (Signed)
General Anesthesia, Adult, Care After  This sheet gives you information about how to care for yourself after your procedure. Your health care provider may also give you more specific instructions. If you have problems or questions, contact your health care provider.  What can I expect after the procedure?  After the procedure, the following side effects are common:  Pain or discomfort at the IV site.  Nausea.  Vomiting.  Sore throat.  Trouble concentrating.  Feeling cold or chills.  Weak or tired.  Sleepiness and fatigue.  Soreness and body aches. These side effects can affect parts of the body that were not involved in surgery.  Follow these instructions at home:    For at least 24 hours after the procedure:  Have a responsible adult stay with you. It is important to have someone help care for you until you are awake and alert.  Rest as needed.  Do not:  Participate in activities in which you could fall or become injured.  Drive.  Use heavy machinery.  Drink alcohol.  Take sleeping pills or medicines that cause drowsiness.  Make important decisions or sign legal documents.  Take care of children on your own.  Eating and drinking  Follow any instructions from your health care provider about eating or drinking restrictions.  When you feel hungry, start by eating small amounts of foods that are soft and easy to digest (bland), such as toast. Gradually return to your regular diet.  Drink enough fluid to keep your urine pale yellow.  If you vomit, rehydrate by drinking water, juice, or clear broth.  General instructions  If you have sleep apnea, surgery and certain medicines can increase your risk for breathing problems. Follow instructions from your health care provider about wearing your sleep device:  Anytime you are sleeping, including during daytime naps.  While taking prescription pain medicines, sleeping medicines, or medicines that make you drowsy.  Return to your normal activities as told by your health care  provider. Ask your health care provider what activities are safe for you.  Take over-the-counter and prescription medicines only as told by your health care provider.  If you smoke, do not smoke without supervision.  Keep all follow-up visits as told by your health care provider. This is important.  Contact a health care provider if:  You have nausea or vomiting that does not get better with medicine.  You cannot eat or drink without vomiting.  You have pain that does not get better with medicine.  You are unable to pass urine.  You develop a skin rash.  You have a fever.  You have redness around your IV site that gets worse.  Get help right away if:  You have difficulty breathing.  You have chest pain.  You have blood in your urine or stool, or you vomit blood.  Summary  After the procedure, it is common to have a sore throat or nausea. It is also common to feel tired.  Have a responsible adult stay with you for the first 24 hours after general anesthesia. It is important to have someone help care for you until you are awake and alert.  When you feel hungry, start by eating small amounts of foods that are soft and easy to digest (bland), such as toast. Gradually return to your regular diet.  Drink enough fluid to keep your urine pale yellow.  Return to your normal activities as told by your health care provider. Ask your health care   provider what activities are safe for you.  This information is not intended to replace advice given to you by your health care provider. Make sure you discuss any questions you have with your health care provider.  Document Released: 04/27/2000 Document Revised: 09/04/2016 Document Reviewed: 09/04/2016  Elsevier Interactive Patient Education  2019 [employer]

## 2018-04-12 ENCOUNTER — Encounter
Admission: RE | Disposition: A | Discharge status: Home / Self Care | Payer: Self-pay | Source: Home / Self Care | Attending: Ophthalmology

## 2018-04-12 ENCOUNTER — Ambulatory Visit: Payer: Medicare Other | Admitting: Anesthesiology

## 2018-04-12 ENCOUNTER — Ambulatory Visit
Admission: RE | Admit: 2018-04-12 | Discharge: 2018-04-12 | Disposition: A | Discharge status: Home / Self Care | Payer: Medicare Other | Attending: Ophthalmology | Admitting: Ophthalmology

## 2018-04-12 DIAGNOSIS — G6289 Other specified polyneuropathies: Secondary | ICD-10-CM | POA: Diagnosis not present

## 2018-04-12 DIAGNOSIS — Z79899 Other long term (current) drug therapy: Secondary | ICD-10-CM | POA: Insufficient documentation

## 2018-04-12 DIAGNOSIS — Z7952 Long term (current) use of systemic steroids: Secondary | ICD-10-CM | POA: Insufficient documentation

## 2018-04-12 DIAGNOSIS — Z882 Allergy status to sulfonamides status: Secondary | ICD-10-CM | POA: Diagnosis not present

## 2018-04-12 DIAGNOSIS — Z7982 Long term (current) use of aspirin: Secondary | ICD-10-CM | POA: Diagnosis not present

## 2018-04-12 DIAGNOSIS — M1990 Unspecified osteoarthritis, unspecified site: Secondary | ICD-10-CM | POA: Insufficient documentation

## 2018-04-12 DIAGNOSIS — I10 Essential (primary) hypertension: Secondary | ICD-10-CM | POA: Insufficient documentation

## 2018-04-12 DIAGNOSIS — C9510 Chronic leukemia of unspecified cell type not having achieved remission: Secondary | ICD-10-CM | POA: Insufficient documentation

## 2018-04-12 DIAGNOSIS — H2512 Age-related nuclear cataract, left eye: Secondary | ICD-10-CM | POA: Insufficient documentation

## 2018-04-12 HISTORY — PX: CATARACT EXTRACTION W/PHACO: SHX586

## 2018-04-12 SURGERY — PHACOEMULSIFICATION, CATARACT, WITH IOL INSERTION
Anesthesia: Monitor Anesthesia Care | Site: Eye | Laterality: Left

## 2018-04-12 MED ORDER — FENTANYL CITRATE (PF) 100 MCG/2ML IJ SOLN
INTRAMUSCULAR | Status: DC | PRN
  Administered 2018-04-12: 50 ug via INTRAVENOUS

## 2018-04-12 MED ORDER — LIDOCAINE HCL (PF) 2 % IJ SOLN
INTRAOCULAR | Status: DC | PRN
  Administered 2018-04-12: 2 mL

## 2018-04-12 MED ORDER — MOXIFLOXACIN HCL 0.5 % OP SOLN
1.0000 [drp] | OPHTHALMIC | Status: DC | PRN
  Administered 2018-04-12 (×3): 1 [drp] via OPHTHALMIC

## 2018-04-12 MED ORDER — BRIMONIDINE TARTRATE-TIMOLOL 0.2-0.5 % OP SOLN
OPHTHALMIC | Status: DC | PRN
  Administered 2018-04-12: 1 [drp] via OPHTHALMIC

## 2018-04-12 MED ORDER — MIDAZOLAM HCL 2 MG/2ML IJ SOLN
INTRAMUSCULAR | Status: DC | PRN
  Administered 2018-04-12: 1 mg via INTRAVENOUS
  Administered 2018-04-12: 0.5 mg via INTRAVENOUS

## 2018-04-12 MED ORDER — TETRACAINE HCL 0.5 % OP SOLN
1.0000 [drp] | OPHTHALMIC | Status: DC | PRN
  Administered 2018-04-12 (×3): 1 [drp] via OPHTHALMIC

## 2018-04-12 MED ORDER — NA HYALUR & NA CHOND-NA HYALUR 0.4-0.35 ML IO KIT
PACK | INTRAOCULAR | Status: DC | PRN
  Administered 2018-04-12: 1 mL via INTRAOCULAR

## 2018-04-12 MED ORDER — LACTATED RINGERS IV SOLN: INTRAVENOUS | Status: DC

## 2018-04-12 MED ORDER — CEFUROXIME OPHTHALMIC INJECTION 1 MG/0.1 ML
INJECTION | OPHTHALMIC | Status: DC | PRN
  Administered 2018-04-12: 0.1 mL via INTRACAMERAL

## 2018-04-12 MED ORDER — EPINEPHRINE PF 1 MG/ML IJ SOLN
INTRAOCULAR | Status: DC | PRN
  Administered 2018-04-12: 100 mL via OPHTHALMIC

## 2018-04-12 MED ORDER — ARMC OPHTHALMIC DILATING DROPS
1.0000 "application " | OPHTHALMIC | Status: DC | PRN
  Administered 2018-04-12 (×2): 1 via OPHTHALMIC

## 2018-04-12 SURGICAL SUPPLY — 20 items
CANNULA ANT/CHMB 27G (MISCELLANEOUS) ×1 IMPLANT
CANNULA ANT/CHMB 27GA (MISCELLANEOUS) ×3 IMPLANT
GLOVE SURG LX 7.5 STRW (GLOVE) ×2
GLOVE SURG LX STRL 7.5 STRW (GLOVE) ×1 IMPLANT
GLOVE SURG TRIUMPH 8.0 PF LTX (GLOVE) ×3 IMPLANT
GOWN STRL REUS W/ TWL LRG LVL3 (GOWN DISPOSABLE) ×2 IMPLANT
GOWN STRL REUS W/TWL LRG LVL3 (GOWN DISPOSABLE) ×4
LENS IOL TECNIS ITEC 20.0 (Intraocular Lens) ×2 IMPLANT
MARKER SKIN DUAL TIP RULER LAB (MISCELLANEOUS) ×3 IMPLANT
NDL FILTER BLUNT 18X1 1/2 (NEEDLE) ×1 IMPLANT
NEEDLE FILTER BLUNT 18X 1/2SAF (NEEDLE) ×2
NEEDLE FILTER BLUNT 18X1 1/2 (NEEDLE) ×1 IMPLANT
PACK CATARACT BRASINGTON (MISCELLANEOUS) ×3 IMPLANT
PACK EYE AFTER SURG (MISCELLANEOUS) ×3 IMPLANT
PACK OPTHALMIC (MISCELLANEOUS) ×3 IMPLANT
SYR 3ML LL SCALE MARK (SYRINGE) ×3 IMPLANT
SYR 5ML LL (SYRINGE) ×3 IMPLANT
SYR TB 1ML LUER SLIP (SYRINGE) ×3 IMPLANT
WATER STERILE IRR 500ML POUR (IV SOLUTION) ×3 IMPLANT
WIPE NON LINTING 3.25X3.25 (MISCELLANEOUS) ×3 IMPLANT

## 2018-04-12 NOTE — Op Note (Signed)
OPERATIVE NOTE  [name] [id number] 04/12/2018   PREOPERATIVE DIAGNOSIS:  Nuclear sclerotic cataract left eye. H25.12   POSTOPERATIVE DIAGNOSIS:    Nuclear sclerotic cataract left eye.     PROCEDURE:  Phacoemusification with posterior chamber intraocular lens placement of the left eye   LENS:   Implant Name Type Inv. Item Serial No. Manufacturer Lot No. LRB No. Used  LENS IOL DIOP 20.0 - [id number] Intraocular Lens LENS IOL DIOP 20.0 [id number] AMO  Left 1        ULTRASOUND TIME: 18  % of 1 minutes 38 seconds, CDE 17.8  SURGEON:  Wyonia Hough, MD   ANESTHESIA:  Topical with tetracaine drops and 2% Xylocaine jelly, augmented with 1% preservative-free intracameral lidocaine.    COMPLICATIONS:  None.   DESCRIPTION OF PROCEDURE:  The patient was identified in the holding room and transported to the operating room and placed in the supine position under the operating microscope.  The left eye was identified as the operative eye and it was prepped and draped in the usual sterile ophthalmic fashion.   A 1 millimeter clear-corneal paracentesis was made at the 1:30 position.  0.5 ml of preservative-free 1% lidocaine was injected into the anterior chamber.  The anterior chamber was filled with Viscoat viscoelastic.  A 2.4 millimeter keratome was used to make a near-clear corneal incision at the 10:30 position.  .  A curvilinear capsulorrhexis was made with a cystotome and capsulorrhexis forceps.  Balanced salt solution was used to hydrodissect and hydrodelineate the nucleus.   Phacoemulsification was then used in stop and chop fashion to remove the lens nucleus and epinucleus.  The remaining cortex was then removed using the irrigation and aspiration handpiece. Provisc was then placed into the capsular bag to distend it for lens placement.  A lens was then injected into the capsular bag.  The remaining viscoelastic was aspirated.   Wounds were hydrated with balanced salt  solution.  The anterior chamber was inflated to a physiologic pressure with balanced salt solution.  No wound leaks were noted. Cefuroxime 0.1 ml of a 10mg /ml solution was injected into the anterior chamber for a dose of 1 mg of intracameral antibiotic at the completion of the case.   Timolol and Brimonidine drops were applied to the eye.  The patient was taken to the recovery room in stable condition without complications of anesthesia or surgery.  [name] 04/12/2018, 1:50 PM

## 2018-04-12 NOTE — H&P (Signed)

## 2018-04-12 NOTE — Anesthesia Postprocedure Evaluation (Signed)
Anesthesia Post Note  Patient: [name]  Procedure(s) Performed: CATARACT EXTRACTION PHACO AND INTRAOCULAR LENS PLACEMENT ([facility])  LEFT (Left Eye)  Patient location during evaluation: PACU Anesthesia Type: MAC Level of consciousness: awake and alert Pain management: pain level controlled Vital Signs Assessment: post-procedure vital signs reviewed and stable Respiratory status: spontaneous breathing, nonlabored ventilation, respiratory function stable and patient connected to nasal cannula oxygen Cardiovascular status: stable and blood pressure returned to baseline Postop Assessment: no apparent nausea or vomiting Anesthetic complications: no    Alisa Graff

## 2018-04-12 NOTE — Anesthesia Preprocedure Evaluation (Addendum)
Anesthesia Evaluation  Patient identified by MRN, date of birth, ID band Patient awake    Reviewed: Allergy & Precautions, H&P , NPO status , Patient's Chart, lab work & pertinent test results, reviewed documented beta blocker date and time   Airway Mallampati: II  TM Distance: >3 FB Neck ROM: full    Dental no notable dental hx.    Pulmonary neg pulmonary ROS,    Pulmonary exam normal breath sounds clear to auscultation       Cardiovascular Exercise Tolerance: Good hypertension,  Rhythm:regular Rate:Normal     Neuro/Psych Chronic inflammatory demylanating neuropathy negative psych ROS   GI/Hepatic negative GI ROS, Neg liver ROS,   Endo/Other  negative endocrine ROS  Renal/GU negative Renal ROS  negative genitourinary   Musculoskeletal   Abdominal   Peds  Hematology negative hematology ROS (+) Blood dyscrasia, anemia ,   Anesthesia Other Findings   Reproductive/Obstetrics negative OB ROS                            Anesthesia Physical Anesthesia Plan  ASA: III  Anesthesia Plan: MAC   Post-op Pain Management:    Induction:   PONV Risk Score and Plan:   Airway Management Planned:   Additional Equipment:   Intra-op Plan:   Post-operative Plan:   Informed Consent: I have reviewed the patients History and Physical, chart, labs and discussed the procedure including the risks, benefits and alternatives for the proposed anesthesia with the patient or authorized representative who has indicated his/her understanding and acceptance.     Dental Advisory Given  Plan Discussed with: CRNA  Anesthesia Plan Comments:        Anesthesia Quick Evaluation

## 2018-04-12 NOTE — Transfer of Care (Signed)
Immediate Anesthesia Transfer of Care Note  Patient: [name]  Procedure(s) Performed: CATARACT EXTRACTION PHACO AND INTRAOCULAR LENS PLACEMENT (IOC)  LEFT (Left Eye)  Patient Location: PACU  Anesthesia Type: MAC  Level of Consciousness: awake, alert  and patient cooperative  Airway and Oxygen Therapy: Patient Spontanous Breathing and Patient connected to supplemental oxygen  Post-op Assessment: Post-op Vital signs reviewed, Patient's Cardiovascular Status Stable, Respiratory Function Stable, Patent Airway and No signs of Nausea or vomiting  Post-op Vital Signs: Reviewed and stable  Complications: No apparent anesthesia complications

## 2018-04-12 NOTE — Anesthesia Procedure Notes (Signed)
Procedure Name: MAC Date/Time: 04/12/2018 1:29 PM Performed by: Cameron Ali, CRNA Pre-anesthesia Checklist: Patient identified, Emergency Drugs available, Suction available, Timeout performed and Patient being monitored Patient Re-evaluated:Patient Re-evaluated prior to induction Oxygen Delivery Method: Nasal cannula Placement Confirmation: positive ETCO2

## 2018-04-13 ENCOUNTER — Encounter: Payer: Self-pay | Admitting: Ophthalmology

## 2018-05-18 ENCOUNTER — Telehealth: Payer: Self-pay | Admitting: Hematology and Oncology

## 2018-05-18 ENCOUNTER — Telehealth: Payer: Self-pay

## 2018-05-18 NOTE — Telephone Encounter (Signed)
Called and given below message. He verbalized understanding. He is doing well and would like to reschedule to the first week of July. Scheduling message sent.

## 2018-05-18 NOTE — Telephone Encounter (Signed)
R/s appt per 4/15 sch message - unable to reach patient - left message and mailed letter

## 2018-05-18 NOTE — Telephone Encounter (Signed)
-----   Message from Heath Lark, MD sent at 05/17/2018  4:32 PM EDT ----- Regarding: reschedule to July Can you call and ask how he is doing? If doing well, I suggest moving his appt to July Labs, see me and 30 mins appt

## 2018-06-02 ENCOUNTER — Ambulatory Visit: Payer: Medicare Other | Admitting: Hematology and Oncology

## 2018-06-02 ENCOUNTER — Other Ambulatory Visit: Payer: Medicare Other

## 2018-06-08 ENCOUNTER — Other Ambulatory Visit: Payer: Self-pay | Admitting: Hematology and Oncology

## 2018-08-08 ENCOUNTER — Other Ambulatory Visit: Payer: Self-pay | Admitting: Hematology and Oncology

## 2018-08-08 DIAGNOSIS — C91Z0 Other lymphoid leukemia not having achieved remission: Secondary | ICD-10-CM

## 2018-08-09 ENCOUNTER — Inpatient Hospital Stay (HOSPITAL_BASED_OUTPATIENT_CLINIC_OR_DEPARTMENT_OTHER): Payer: Medicare Other | Admitting: Hematology and Oncology

## 2018-08-09 ENCOUNTER — Other Ambulatory Visit: Payer: Self-pay

## 2018-08-09 ENCOUNTER — Inpatient Hospital Stay: Payer: Medicare Other | Attending: Hematology and Oncology

## 2018-08-09 DIAGNOSIS — K760 Fatty (change of) liver, not elsewhere classified: Secondary | ICD-10-CM | POA: Diagnosis not present

## 2018-08-09 DIAGNOSIS — Z7952 Long term (current) use of systemic steroids: Secondary | ICD-10-CM | POA: Insufficient documentation

## 2018-08-09 DIAGNOSIS — R945 Abnormal results of liver function studies: Secondary | ICD-10-CM | POA: Insufficient documentation

## 2018-08-09 DIAGNOSIS — M25519 Pain in unspecified shoulder: Secondary | ICD-10-CM

## 2018-08-09 DIAGNOSIS — T380X5A Adverse effect of glucocorticoids and synthetic analogues, initial encounter: Secondary | ICD-10-CM | POA: Insufficient documentation

## 2018-08-09 DIAGNOSIS — R634 Abnormal weight loss: Secondary | ICD-10-CM | POA: Insufficient documentation

## 2018-08-09 DIAGNOSIS — G6181 Chronic inflammatory demyelinating polyneuritis: Secondary | ICD-10-CM | POA: Diagnosis not present

## 2018-08-09 DIAGNOSIS — C91Z0 Other lymphoid leukemia not having achieved remission: Secondary | ICD-10-CM | POA: Insufficient documentation

## 2018-08-09 DIAGNOSIS — D472 Monoclonal gammopathy: Secondary | ICD-10-CM | POA: Insufficient documentation

## 2018-08-09 DIAGNOSIS — R7989 Other specified abnormal findings of blood chemistry: Secondary | ICD-10-CM

## 2018-08-09 DIAGNOSIS — Z79899 Other long term (current) drug therapy: Secondary | ICD-10-CM

## 2018-08-09 DIAGNOSIS — Z882 Allergy status to sulfonamides status: Secondary | ICD-10-CM | POA: Insufficient documentation

## 2018-08-09 DIAGNOSIS — G720 Drug-induced myopathy: Secondary | ICD-10-CM | POA: Diagnosis not present

## 2018-08-09 LAB — COMPREHENSIVE METABOLIC PANEL
ALT: 50 U/L — ABNORMAL HIGH (ref 0–44)
AST: 41 U/L (ref 15–41)
Albumin: 3.5 g/dL (ref 3.5–5.0)
Alkaline Phosphatase: 108 U/L (ref 38–126)
Anion gap: 9 (ref 5–15)
BUN: 13 mg/dL (ref 8–23)
CO2: 22 mmol/L (ref 22–32)
Calcium: 7.9 mg/dL — ABNORMAL LOW (ref 8.9–10.3)
Chloride: 108 mmol/L (ref 98–111)
Creatinine, Ser: 1.06 mg/dL (ref 0.61–1.24)
GFR calc Af Amer: >60 mL/min (ref >60)
GFR calc non Af Amer: >60 mL/min (ref >60)
Glucose, Bld: 118 mg/dL — ABNORMAL HIGH (ref 70–99)
Potassium: 4.1 mmol/L (ref 3.5–5.1)
Sodium: 139 mmol/L (ref 135–145)
Total Bilirubin: 0.9 mg/dL (ref 0.3–1.2)
Total Protein: 6.1 g/dL — ABNORMAL LOW (ref 6.5–8.1)

## 2018-08-09 LAB — CBC WITH DIFFERENTIAL/PLATELET
Abs Immature Granulocytes: 0.01 10*3/uL (ref 0.00–0.07)
Basophils Absolute: 0 10*3/uL (ref 0.0–0.1)
Basophils Relative: 1 %
Eosinophils Absolute: 0 10*3/uL (ref 0.0–0.5)
Eosinophils Relative: 1 %
HCT: 32.4 % — ABNORMAL LOW (ref 39.0–52.0)
Hemoglobin: 10.9 g/dL — ABNORMAL LOW (ref 13.0–17.0)
Immature Granulocytes: 0 %
Lymphocytes Relative: 39 %
Lymphs Abs: 1.2 10*3/uL (ref 0.7–4.0)
MCH: 37.7 pg — ABNORMAL HIGH (ref 26.0–34.0)
MCHC: 33.6 g/dL (ref 30.0–36.0)
MCV: 112.1 fL — ABNORMAL HIGH (ref 80.0–100.0)
Monocytes Absolute: 0.1 10*3/uL (ref 0.1–1.0)
Monocytes Relative: 4 %
Neutro Abs: 1.7 10*3/uL (ref 1.7–7.7)
Neutrophils Relative %: 55 %
Platelets: 223 10*3/uL (ref 150–400)
RBC: 2.89 MIL/uL — ABNORMAL LOW (ref 4.22–5.81)
RDW: 16.9 % — ABNORMAL HIGH (ref 11.5–15.5)
WBC: 3.2 10*3/uL — ABNORMAL LOW (ref 4.0–10.5)
nRBC: 0 % (ref 0.0–0.2)

## 2018-08-10 ENCOUNTER — Encounter: Payer: Self-pay | Admitting: Hematology and Oncology

## 2018-08-10 NOTE — Assessment & Plan Note (Signed)
The patient has MGUS. Bone marrow biopsy revealed 5-10% plasma cells, insignificant His last M protein was mildly elevated but stable I plan to check it again once a year, next due in January 2021

## 2018-08-10 NOTE — Assessment & Plan Note (Signed)
He has clear signs of steroid myopathy.  He continues doing home exercises. He denies recent fall.  We discussed strategies to minimize risk of fall

## 2018-08-10 NOTE — Assessment & Plan Note (Signed)
This is multifactorial, likely due to side effects of methotrexate, fatty liver disease (confirmed on CT from 2017) and also related to recent findings of hyperlipidemia and likely fatty liver disease from chronic steroid use and obesity I recommend close follow-up and dietary modification  It has improved since he has lost some weight recently

## 2018-08-10 NOTE — Assessment & Plan Note (Signed)
He tolerated methotrexate well at 12.5 mg dose Lymphocytosis had resolved I recommend continue the same dose for now

## 2018-08-10 NOTE — Assessment & Plan Note (Signed)
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

## 2018-08-10 NOTE — Progress Notes (Signed)
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  He continues doing home exercises. He denies recent fall.  We discussed strategies to minimize risk of fall  LFT elevation This is multifactorial, likely due to side effects of methotrexate, fatty liver disease (confirmed on CT from 2017) and also related to recent findings of hyperlipidemia and likely fatty liver disease from chronic steroid use and obesity I recommend close follow-up and dietary modification  It has improved since he has lost some weight recently   Orders Placed This Encounter  Procedures  . Comprehensive metabolic panel    Standing Status:   Future    Standing Expiration Date:   09/14/2019  . CBC with Differential/Platelet    Standing Status:   Future    Standing Expiration Date:   09/14/2019  . Kappa/lambda light chains    Standing Status:   Future    Standing Expiration Date:   09/14/2019  . Multiple Myeloma Panel (SPEP&IFE w/QIG)   Standing Status:   Future    Standing Expiration Date:   09/14/2019    INTERVAL HISTORY: Please see below for problem oriented charting. 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 T-cell receptor rearrangement study is still pending   12/19/2015 -  Chemotherapy   The patient was started on 10 mg once a week methotrexate. The dose is increased to 12.5 mg starting 01/16/16      REVIEW OF SYSTEMS:   Constitutional: Denies fevers, chills or abnormal weight loss Eyes: Denies blurriness of vision Ears, nose, mouth, throat, and face: Denies mucositis or sore throat Respiratory: Denies cough, dyspnea or wheezes Cardiovascular: Denies palpitation, chest discomfort or lower extremity swelling Gastrointestinal:  Denies nausea, heartburn or change in bowel habits Skin: Denies abnormal skin rashes Lymphatics: Denies new lymphadenopathy or easy bruising Neurological:Denies numbness, tingling or new weaknesses Behavioral/Psych: Mood is stable, no new changes  All other systems were reviewed with the patient and are negative.  I have reviewed the past medical history, past surgical history, social history and family history with the patient and they are unchanged from previous note.  ALLERGIES:  is allergic to sulfa antibiotics and sulfonamide derivatives.  MEDICATIONS:  Current Outpatient Medications  Medication Sig Dispense Refill  . acetaminophen (TYLENOL) 325 MG tablet Take 650 mg by mouth every 6 (six) hours as needed.    Marland Kitchen aspirin EC 81  MG tablet Take 162 mg by mouth daily.    Marland Kitchen CALCIUM CARB-CHOLECALCIFEROL PO Take 1,250 mg by mouth daily.    . Cholecalciferol (VITAMIN D3) 25 MCG (1000 UT) CAPS Take 2 capsules by mouth daily.    . Cyanocobalamin (VITAMIN B-12) 500 MCG SUBL Place 1 tablet under the tongue daily.     Marland Kitchen econazole nitrate 1 % cream Apply 1 application topically daily as needed.    . folic acid (FOLVITE) 468 MCG tablet Take 400 mcg by mouth daily.    Marland Kitchen gabapentin (NEURONTIN) 300 MG capsule Take 2 capsules (600 mg total) by mouth 2 (two) times daily. 360 capsule 3  . methotrexate (RHEUMATREX) 2.5 MG tablet TAKE 5 TABLETS (12.5 MG TOTAL) BY MOUTH ONCE A WEEK. CAUTION: CHEMOTHERAPY. PROTECT FROM LIGHT. 30 tablet 8  . Multiple Vitamin (MULTIVITAMIN) tablet Take 1 tablet by mouth daily.      . predniSONE (DELTASONE) 5 MG tablet TAKE 1 TABLET BY MOUTH ON MONDAYS, WEDS, AND FRIDAYS, AND TAKE 2 TABS OTHER DAYS OF THE WEEK 90 tablet 1  . timolol (TIMOPTIC) 0.5 % ophthalmic solution Place 1 drop into both eyes at bedtime. One drop in each eye at bedtime.    . triamcinolone (KENALOG) 0.025 % cream Apply 1 application topically as needed.     No current facility-administered medications for this visit.     PHYSICAL EXAMINATION: ECOG PERFORMANCE STATUS: 1 - Symptomatic but completely ambulatory  Vitals:   08/09/18 1240  BP: 108/71  Pulse: (!) 52  Resp: 18  Temp: 98.7 F (37.1 C)  SpO2: 97%   Filed Weights   08/09/18 1240  Weight: 207 lb (93.9 kg)    GENERAL:alert, no distress and comfortable SKIN: skin color, texture, turgor are normal, no rashes or significant lesions EYES: normal, Conjunctiva are pink and non-injected, sclera clear OROPHARYNX:no exudate, no erythema and lips, buccal mucosa, and tongue normal  NECK: supple, thyroid normal size, non-tender, without nodularity LYMPH:  no palpable lymphadenopathy in the cervical, axillary or inguinal LUNGS: clear to auscultation and percussion with normal  breathing effort HEART: regular rate & rhythm and no murmurs with stable bilateral lower extremity edema with chronic venous stasis changes ABDOMEN:abdomen soft, non-tender and normal bowel sounds Musculoskeletal:no cyanosis of digits and no clubbing  NEURO: alert & oriented x 3 with fluent speech, no focal motor/sensory deficits  LABORATORY DATA:  I have reviewed the data as listed    Component Value Date/Time   NA 139 08/09/2018 1213   NA 138 12/15/2016 1319   K 4.1 08/09/2018 1213   K 4.1 12/15/2016 1319   CL 108 08/09/2018 1213   CO2 22 08/09/2018 1213   CO2 24 12/15/2016 1319   GLUCOSE 118 (H) 08/09/2018 1213   GLUCOSE 123 12/15/2016 1319   BUN 13 08/09/2018 1213   BUN 13.7 12/15/2016 1319   CREATININE 1.06 08/09/2018 1213   CREATININE 1.2 12/15/2016 1319   CALCIUM 7.9 (L) 08/09/2018 1213   CALCIUM 8.8 12/15/2016 1319   PROT 6.1 (L) 08/09/2018 1213   PROT 6.5 12/15/2016 1319   PROT 6.2 12/15/2016 1319   ALBUMIN 3.5 08/09/2018 1213   ALBUMIN 3.5 12/15/2016 1319   AST 41 08/09/2018 1213   AST 66 (H) 12/15/2016 1319   ALT 50 (H) 08/09/2018  1213   ALT 90 (H) 12/15/2016 1319   ALKPHOS 108 08/09/2018 1213   ALKPHOS 116 12/15/2016 1319   BILITOT 0.9 08/09/2018 1213   BILITOT 1.13 12/15/2016 1319   GFRNONAA >60 08/09/2018 1213   GFRAA >60 08/09/2018 1213    No results found for: SPEP, UPEP  Lab Results  Component Value Date   WBC 3.2 (L) 08/09/2018   NEUTROABS 1.7 08/09/2018   HGB 10.9 (L) 08/09/2018   HCT 32.4 (L) 08/09/2018   MCV 112.1 (H) 08/09/2018   PLT 223 08/09/2018      Chemistry      Component Value Date/Time   NA 139 08/09/2018 1213   NA 138 12/15/2016 1319   K 4.1 08/09/2018 1213   K 4.1 12/15/2016 1319   CL 108 08/09/2018 1213   CO2 22 08/09/2018 1213   CO2 24 12/15/2016 1319   BUN 13 08/09/2018 1213   BUN 13.7 12/15/2016 1319   CREATININE 1.06 08/09/2018 1213   CREATININE 1.2 12/15/2016 1319      Component Value Date/Time   CALCIUM 7.9  (L) 08/09/2018 1213   CALCIUM 8.8 12/15/2016 1319   ALKPHOS 108 08/09/2018 1213   ALKPHOS 116 12/15/2016 1319   AST 41 08/09/2018 1213   AST 66 (H) 12/15/2016 1319   ALT 50 (H) 08/09/2018 1213   ALT 90 (H) 12/15/2016 1319   BILITOT 0.9 08/09/2018 1213   BILITOT 1.13 12/15/2016 1319     All questions were answered. The patient knows to call the clinic with any problems, questions or concerns. No barriers to learning was detected.  I spent 15 minutes counseling the patient face to face. The total time spent in the appointment was 20 minutes and more than 50% was on counseling and review of test results  Heath Lark, MD 08/10/2018 11:27 AM

## 2018-09-14 ENCOUNTER — Other Ambulatory Visit: Payer: Self-pay | Admitting: Hematology and Oncology

## 2018-10-26 ENCOUNTER — Ambulatory Visit: Payer: Medicare Other

## 2018-10-31 ENCOUNTER — Encounter: Payer: Medicare Other | Admitting: Family Medicine

## 2018-11-09 ENCOUNTER — Emergency Department
Admission: EM | Admit: 2018-11-09 | Discharge: 2018-11-09 | Disposition: A | Discharge status: Home / Self Care | Payer: Medicare Other | Attending: Emergency Medicine | Admitting: Emergency Medicine

## 2018-11-09 ENCOUNTER — Emergency Department: Payer: Medicare Other

## 2018-11-09 ENCOUNTER — Other Ambulatory Visit: Payer: Self-pay

## 2018-11-09 DIAGNOSIS — I10 Essential (primary) hypertension: Secondary | ICD-10-CM | POA: Insufficient documentation

## 2018-11-09 DIAGNOSIS — Z7982 Long term (current) use of aspirin: Secondary | ICD-10-CM | POA: Insufficient documentation

## 2018-11-09 DIAGNOSIS — W19XXXA Unspecified fall, initial encounter: Secondary | ICD-10-CM | POA: Insufficient documentation

## 2018-11-09 DIAGNOSIS — Y999 Unspecified external cause status: Secondary | ICD-10-CM | POA: Insufficient documentation

## 2018-11-09 DIAGNOSIS — Z856 Personal history of leukemia: Secondary | ICD-10-CM | POA: Diagnosis not present

## 2018-11-09 DIAGNOSIS — Y929 Unspecified place or not applicable: Secondary | ICD-10-CM | POA: Diagnosis not present

## 2018-11-09 DIAGNOSIS — Z9221 Personal history of antineoplastic chemotherapy: Secondary | ICD-10-CM | POA: Insufficient documentation

## 2018-11-09 DIAGNOSIS — Z79899 Other long term (current) drug therapy: Secondary | ICD-10-CM | POA: Insufficient documentation

## 2018-11-09 DIAGNOSIS — Z23 Encounter for immunization: Secondary | ICD-10-CM | POA: Diagnosis not present

## 2018-11-09 DIAGNOSIS — Y939 Activity, unspecified: Secondary | ICD-10-CM | POA: Diagnosis not present

## 2018-11-09 DIAGNOSIS — S81812A Laceration without foreign body, left lower leg, initial encounter: Secondary | ICD-10-CM | POA: Insufficient documentation

## 2018-11-09 HISTORY — DX: Leukemia, unspecified not having achieved remission: C95.90

## 2018-11-09 MED ORDER — TETANUS-DIPHTHERIA TOXOIDS TD 5-2 LFU IM INJ
0.5000 mL | INJECTION | Freq: Once | INTRAMUSCULAR | Status: AC
  Administered 2018-11-09: 18:00:00 0.5 mL via INTRAMUSCULAR
  Filled 2018-11-09: qty 0.5

## 2018-11-09 MED ORDER — LIDOCAINE HCL (PF) 1 % IJ SOLN
10.0000 mL | Freq: Once | INTRAMUSCULAR | Status: DC
  Filled 2018-11-09: qty 10

## 2018-11-09 MED ORDER — CEPHALEXIN 500 MG PO CAPS: 500.0000 mg | ORAL_CAPSULE | Freq: Two times a day (BID) | ORAL | 0 refills | Status: DC

## 2018-11-09 NOTE — ED Provider Notes (Signed)
[facility] Emergency Department Provider Note  Time seen: 5:35 PM  I have reviewed the triage vital signs and the nursing notes.   HISTORY  Chief Complaint Fall and Laceration   HPI [name] is a 78 y.o. 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  According to the patient he was attempting to step into his golf cart when he tripped falling down onto his knees however a shovel happened to be under him and he suffered a laceration to his left lower extremity.  Patient takes aspirin as his only anticoagulation.  Denies any other injuries.  Denies hitting his head.  Denies LOC.  Denies any recent fever cough or shortness of breath.  Past Medical History:  Diagnosis Date  . Basal cell carcinoma of skin    skin cancer, resected by derm (Dr. Evorn Gong)  . Cholelithiasis 01/95   via abdominal ultrasound  . 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 Diverticulosis of colon 01/95  . History of kidney stones   . Hyperglycemia    Related to chronic steroid use  . Hypertension 08/07  . Nephrolithiasis 1993   X 2  . Polyneuropathy     Chronic inflammatory demylenating - Prednisone per [id number]  . Post herpetic neuralgia    forehead, right side of face  . Syncope 08/05/07   Presumed hypotension    Patient Active Problem List   Diagnosis Date Noted  . Bilateral cataracts 02/25/2018  . Bilateral edema of lower extremity 02/25/2018  . Healthcare maintenance 10/26/2017  . Family history of cancer 04/06/2017  . Stasis dermatitis of both legs 11/03/2016  . Dysuria 09/22/2016  . Atypical chest pain 05/05/2016  . Nausea without vomiting 03/12/2016  . Basal cell carcinoma of skin 01/15/2016  . Postherpetic neuralgia 01/15/2016  . 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 Physical deconditioning 12/18/2015  .  Kidney stones 12/06/2015  . 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 Macrocytic anemia 11/27/2015  . Lymphoproliferative disorder ([place]) 11/27/2015  . Dyspnea 11/27/2015  . 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 Frequent falls 11/27/2015  . Deficiency anemia 11/17/2015  . Cracking skin 10/02/2015  . Advance care planning 08/17/2014  . Shingles 07/18/2014  . LFT elevation 08/13/2013  . Edema 08/13/2013  . 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 Medicare annual wellness visit, subsequent 12/18/2011  . CIDP (chronic inflammatory demyelinating polyneuropathy) ([place]) 05/18/2011  . 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 Long term systemic steroid user 05/18/2011  . Steroid-induced hyperglycemia 06/05/2010  . VASOVAGAL SYNCOPE 08/05/2007  . Essential hypertension 10/06/2006  . SYSTOLIC MURMUR [id number]    Past Surgical History:  Procedure Laterality Date  . CATARACT EXTRACTION W/PHACO Right 03/09/2018   Procedure: CATARACT EXTRACTION PHACO AND INTRAOCULAR LENS PLACEMENT ([place])  RIGHT;  Surgeon: Leandrew Koyanagi, MD;  Location: [facility];  Service: Ophthalmology;  Laterality: Right;  . CATARACT EXTRACTION W/PHACO Left 04/12/2018   Procedure: CATARACT EXTRACTION PHACO AND INTRAOCULAR LENS PLACEMENT ([place])  LEFT;  Surgeon: Leandrew Koyanagi, MD;  Location: [facility];  Service: Ophthalmology;  Laterality: Left;  . CHOLECYSTECTOMY  10/27/99   [facility] Done)  . DOPPLER ECHOCARDIOGRAPHY  08/01   T.R.; T.R.; P/R. borderline LVH  . ESOPHAGOGASTRODUODENOSCOPY  12/19/05   gastritis; H.H.  . HERNIA REPAIR  06/17/04   Laparoscopic, bilateral  . [place]  . [place] SURGERY  2000  L3/4 rupture  . MOHS SURGERY Left 01/09/2016   left side of nose  . nephrolithiasis      Prior to Admission medications   Medication Sig Start Date End Date Taking? Authorizing Provider  acetaminophen (TYLENOL) 325 MG tablet Take 650 mg by mouth every 6 (six) hours as needed.     [provider]  aspirin EC 81 MG tablet Take 162 mg by mouth daily.    [provider]  CALCIUM CARB-CHOLECALCIFEROL PO Take 1,250 mg by mouth daily.    [provider]  Cholecalciferol (VITAMIN D3) 25 MCG (1000 UT) CAPS Take 2 capsules by mouth daily.    [provider]  Cyanocobalamin (VITAMIN B-12) 500 MCG SUBL Place 1 tablet under the tongue daily.     [provider]  econazole nitrate 1 % cream Apply 1 application topically daily as needed.    [provider]  folic acid (FOLVITE) [id number] MCG tablet Take 400 mcg by mouth daily.    [provider]  gabapentin (NEURONTIN) 300 MG capsule Take 2 capsules (600 mg total) by mouth 2 (two) times daily. 10/25/17   Tonia Ghent, MD  methotrexate (RHEUMATREX) 2.5 MG tablet TAKE 5 TABLETS (12.5 MG TOTAL) BY MOUTH ONCE A WEEK. CAUTION: CHEMOTHERAPY. PROTECT FROM LIGHT. 01/12/18   Heath Lark, MD  Multiple Vitamin (MULTIVITAMIN) tablet Take 1 tablet by mouth daily.      [provider]  predniSONE (DELTASONE) 5 MG tablet TAKE 1 TABLET BY MOUTH ON MONDAYS, WEDS, AND FRIDAYS, AND TAKE 2 TABS OTHER DAYS OF THE WEEK 09/14/18   Heath Lark, MD  timolol (TIMOPTIC) 0.5 % ophthalmic solution Place 1 drop into both eyes at bedtime. One drop in each eye at bedtime.    [provider]  triamcinolone (KENALOG) 0.025 % cream Apply 1 application topically as needed.    [provider]    Allergies  Allergen Reactions  . Sulfa Antibiotics Dermatitis  . Sulfonamide Derivatives Other (See Comments)    Childhood reaction    Family History  Problem Relation Age of Onset  . Hypertension Mother   . Heart disease Mother        MI  . Kidney disease Father        Kidney failure  . Parkinsonism Father   . Cancer Sister        Acute leukemia  . Arthritis Sister   . Cancer Sister 98       ovarian ca  . Cancer Brother        liver cancer  . Diabetes Neg Hx   . Stroke Neg Hx    . Prostate cancer Neg Hx   . Colon cancer Neg Hx     Social History Social History   Tobacco Use  . Smoking status: Never Smoker  . Smokeless tobacco: Never Used  Substance Use Topics  . Alcohol use: No  . Drug use: No    Review of Systems Constitutional: Negative for fever. Cardiovascular: Negative for chest pain. Respiratory: Negative for shortness of breath. Gastrointestinal: Negative for abdominal pain Musculoskeletal: Laceration left lower extremity Skin: Laceration left lower extremity, hemostatic Neurological: Negative for headache All other ROS negative  ____________________________________________   PHYSICAL EXAM:  Constitutional: Alert and oriented. Well appearing and in no distress. Eyes: Normal exam ENT      Head: Normocephalic and atraumatic.      Mouth/Throat: Mucous membranes are moist. Cardiovascular: Normal rate, regular rhythm.  Respiratory: Normal respiratory effort without  tachypnea nor retractions. Breath sounds are clear  Gastrointestinal: Soft and nontender. No distention.   Musculoskeletal: Patient has an approximate six centimeter deep laceration to the anterior aspect of his left lower extremity overlying his tibia. Neurologic:  Normal speech and language. No gross focal neurologic deficits Skin: Sick centimeter laceration to distal left lower extremity.  Small skin tear to left elbow Psychiatric: Mood and affect are normal.   ____________________________________________   RADIOLOGY  Tib/fib x-ray is negative for foreign body or bony abnormality.  ____________________________________________   INITIAL IMPRESSION / ASSESSMENT AND PLAN / ED COURSE  Pertinent labs & imaging results that were available during my care of the patient were reviewed by me and considered in my medical decision making (see chart for details).   Patient presents emergency department for a fall with a laceration to his left lower extremity.  Differential would  include laceration, contusion.  We will obtain an x-ray as a precaution to rule out any type of bone injury or foreign body.  We will irrigate extensively.  We will update the patient's tetanus and repair the laceration.  Patient has chronic peripheral edema as well as neuropathy making the repair quite difficult.  Patient has very thin skin with 3-4+ pitting edema in the lower extremities bilaterally.  Attempted to perform deep Vicryl sutures however unable to approximate the wound edges given the gaping distance and suture strength.  Sutures either ripped or tore through the patient's subcutaneous fat even with very large bites.  After multiple attempts decided to do horizontal mattress sutures to approximate the wound followed by simple interrupted sutures to better approximate skin edges.  Given the patient's peripheral edema thin skin chronic medical conditions patient has a high risk for wound dehiscence or complication.  LACERATION REPAIR Performed by: Harvest Dark Authorized by: Harvest Dark Consent: Verbal consent obtained. Risks and benefits: risks, benefits and alternatives were discussed Consent given by: patient Patient identity confirmed: provided demographic data Prepped and Draped in normal sterile fashion Wound explored  Laceration Location: LLE  Laceration Length: 8cm  No Foreign Bodies seen or palpated  Anesthesia: local infiltration  Local anesthetic: lidocaine 1% wo epinephrine  Anesthetic total: 10 ml  Irrigation method: syringe Amount of cleaning: standard  Skin closure: Three 2-0 silk horizontal mattress sutures.  Three 2-0 silk simple interrupted sutures   Patient tolerance: Patient tolerated the procedure well with no immediate complications.   [name] was evaluated in Emergency Department on 11/09/2018 for the symptoms described in the history of present illness. He was evaluated in the context of the global COVID-19 pandemic, which  necessitated consideration that the patient might be at risk for infection with the SARS-CoV-2 virus that causes COVID-19. Institutional protocols and algorithms that pertain to the evaluation of patients at risk for COVID-19 are in a state of rapid change based on information released by regulatory bodies including the CDC and federal and state organizations. These policies and algorithms were followed during the patient's care in the ED.  ____________________________________________   FINAL CLINICAL IMPRESSION(S) / ED DIAGNOSES  Laceration Cheral Marker, MD 11/09/18 [phone]

## 2018-11-09 NOTE — Discharge Instructions (Signed)
Please follow-up with orthopedics within the next several days for recheck/reevaluation of your left leg wound.  Please take your antibiotics as prescribed for their entire course.  Return to the emergency department for any worsening of the wound, signs of infection such as redness fever or pus, or any other symptom personally concerning to yourself.

## 2018-11-09 NOTE — ED Triage Notes (Signed)
PT to ED via EMS from home. PT was attempting to get into golf cart when he states his leg gave out, he fell and hit shin on shovel. PT denies LOC and remembers fall. PT takes 81mg  aspirin daily. PT with hx of neuropathy and cannot walk without assistance.

## 2018-11-10 ENCOUNTER — Telehealth: Payer: Self-pay

## 2018-11-10 NOTE — Telephone Encounter (Signed)
The purpose of bedrest is to make sure the suture hold He should be able to attend appointment

## 2018-11-10 NOTE — Telephone Encounter (Signed)
[name] pts daughter left v/m (DPR signed) that pt had fallen and went to ED for sutures for large cut on leg last night;pt is back home and susan request Stanton to come in to home to help with dressings for laceration. [name] said pt is high risk for infection and is not sure she and her brother can take care of pt at home; pts mom is also bedridden at home due to fall. Pt cannot feel lower half of legs and pt has leukemia which Dr Damita Dunnings is aware of and ED told pt to stay in bed because they are afraid the sutures will not hold. [name] and her brother are concerned about pt getting the best care for their dad.they are concerned about pt getting infection in laceration and good care of laceration. [name] said the ED suggested getting FU appt with ortho; she is supposed to FU with orthopedics and [name] is going to ck with [facility]. [name] wants to know if any thoughts or suggestions Dr Damita Dunnings could offer about pts care and also wants to know if Dr Damita Dunnings will do [facility] referral. [name] request cb on 11/10/18 or 11/11/18.

## 2018-11-10 NOTE — Telephone Encounter (Signed)
Called and left below message. Ask her to call for questions. 

## 2018-11-10 NOTE — Telephone Encounter (Signed)
I did not see pictures of his leg but from the description is quite serious I suggest hold aspirin for now Does he have appointment to get sutures removed or wound recheck?

## 2018-11-10 NOTE — Telephone Encounter (Signed)
Daughter called and left a message. Her Dad had a fall outside yesterday.  He cut his leg and went to the ER at [facility]. He is back home now. She is taking care of her Mom and now her Dad. She is hoping that you can review chart and look at pictures of his leg. She is hoping that the ED will sent out home health for dressing changes. Daughter is looking for advice so she can give the best care. She is hoping to get your input.

## 2018-11-10 NOTE — Telephone Encounter (Signed)
Called daughter back. Given below message. She verbalized understanding. They will hold aspirin. She will send a photo of lacertation on mycart. They are waiting on home health to contact them. Her Dad is on bedrest. They are supposed to follow up with ortho for wound care. Daughter is concerned and afraid they will not be able to go to ortho due to her Dad being on bedrest.

## 2018-11-11 NOTE — Telephone Encounter (Signed)
Dorna Leitz, CMA  You 19 minutes ago (2:46 PM)   I spoke with patient's daughter & she stated that she would keep you updated as needed. She also wanted to tell you thank you so much for the exceptional care that you give to her father.        ============== Noted. Thanks.

## 2018-11-11 NOTE — Telephone Encounter (Signed)
Spoke with patients daughter Manuela Schwartz.Daughter has experience with some wound care as she had to dress her husbands bad wound. She had some Zero form left over from her husbands wound so she went ahead and re dressed her fathers wound. She feels better about the wound right now after re dressing it but she needs to find some Zero form to be able to continue dressing it. I told her to try durable medical equipment places and or if you know of where she can get more that would be helpful. Patient has followup with [facility] on Monday 11/14/18 at 1pm. I explained that we have reached out to some [facility] agencies but havent heard back yet from them but that we would also need to do a Virtual video visit if we were to order the [place]. She is now more concerned about finding help for her parents so I will send out our list of [place]. I told her I would call her back if I hear from [facility], otherwise she will bring her father to the Orthopedic Surgeon on Monday and hopefully they will offer help with her fathers wound.

## 2018-11-11 NOTE — Telephone Encounter (Signed)
I think home health referral makes sense.  I am checking with one of the referral coordinators about this.  Sometimes we cannot get this approved through me if I have not seen the patient for the issue recently and I am trying to see if the emergency room evaluation will suffice for that requirement.  Thanks.

## 2018-11-11 NOTE — Telephone Encounter (Signed)
[employer] is reasonable, getting at DME supplier is likely the best option.  I'll await input from ortho.  Please have them update me as needed.  Thanks.

## 2018-12-11 ENCOUNTER — Other Ambulatory Visit: Payer: Self-pay | Admitting: Family Medicine

## 2018-12-11 DIAGNOSIS — R739 Hyperglycemia, unspecified: Secondary | ICD-10-CM

## 2018-12-12 ENCOUNTER — Other Ambulatory Visit: Payer: Self-pay | Admitting: Family Medicine

## 2018-12-12 NOTE — Telephone Encounter (Signed)
Electronic refill request Gabapentin Last refill 10/25/17 #360/3 Last office visit 10/25/17 Upcoming appointment 12/22/18

## 2018-12-13 NOTE — Telephone Encounter (Signed)
Sent. Thanks.   

## 2018-12-14 ENCOUNTER — Other Ambulatory Visit: Payer: Medicare Other

## 2018-12-16 ENCOUNTER — Encounter: Payer: Medicare Other | Admitting: Family Medicine

## 2018-12-22 ENCOUNTER — Ambulatory Visit (INDEPENDENT_AMBULATORY_CARE_PROVIDER_SITE_OTHER): Payer: Medicare Other | Admitting: Family Medicine

## 2018-12-22 ENCOUNTER — Other Ambulatory Visit: Payer: Self-pay

## 2018-12-22 ENCOUNTER — Encounter: Payer: Self-pay | Admitting: Family Medicine

## 2018-12-22 VITALS — BP 110/64 | HR 71 | Temp 98.0°F | Ht 76.0 in | Wt 194.4 lb

## 2018-12-22 DIAGNOSIS — Z0000 Encounter for general adult medical examination without abnormal findings: Secondary | ICD-10-CM

## 2018-12-22 DIAGNOSIS — B029 Zoster without complications: Secondary | ICD-10-CM

## 2018-12-22 DIAGNOSIS — S81819S Laceration without foreign body, unspecified lower leg, sequela: Secondary | ICD-10-CM | POA: Diagnosis not present

## 2018-12-22 DIAGNOSIS — Z7189 Other specified counseling: Secondary | ICD-10-CM

## 2018-12-22 DIAGNOSIS — Z23 Encounter for immunization: Secondary | ICD-10-CM

## 2018-12-22 DIAGNOSIS — R739 Hyperglycemia, unspecified: Secondary | ICD-10-CM | POA: Diagnosis not present

## 2018-12-22 DIAGNOSIS — R5381 Other malaise: Secondary | ICD-10-CM

## 2018-12-22 LAB — POCT GLYCOSYLATED HEMOGLOBIN (HGB A1C): Hemoglobin A1C: 5.4 % (ref 4.0–5.6)

## 2018-12-22 MED ORDER — GABAPENTIN 300 MG PO CAPS: 600.0000 mg | ORAL_CAPSULE | Freq: Two times a day (BID) | ORAL | 3 refills | Status: DC

## 2018-12-22 NOTE — Patient Instructions (Signed)
Don't change your meds for now.  I'll check with Dr. Alvy Bimler about the new shingles shot.  Take care.  Glad to see you.  Thanks for getting a flu shot.

## 2018-12-22 NOTE — Progress Notes (Signed)
This visit occurred during the SARS-CoV-2 public health emergency.  Safety protocols were in place, including screening questions prior to the visit, additional usage of staff PPE, and extensive cleaning of exam room while observing appropriate contact time as indicated for disinfecting solutions.    I have personally reviewed the Medicare Annual Wellness questionnaire and have noted 1. The patient's medical and social history 2. Their use of alcohol, tobacco or illicit drugs 3. Their current medications and supplements 4. The patient's functional ability including ADL's, fall risks, home safety risks and hearing or visual             impairment. 5. Diet and physical activities 6. Evidence for depression or mood disorders  The patients weight, height, BMI have been recorded in the chart and visual acuity is per eye clinic.  I have made referrals, counseling and provided education to the patient based review of the above and I have provided the pt with a written personalized care plan for preventive services.  Provider list updated- see scanned forms.  Routine anticipatory guidance given to patient.  See health maintenance. The possibility exists that previously documented standard health maintenance information may have been brought forward from a previous encounter into this note.  If needed, that same information has been updated to reflect the current situation based on today's encounter.    Flu 2020 Shingles discussed with patient.  I will check on this but it should be okay for the patient to get Shingrix. PNA up-to-date Tetanus 2020 Colon cancer screening deferred 2020 given pandemic. Prostate cancer screening deferred given age greater than 52. Advance directive-wife then both kids equally designated if patient were incapacitated. Cognitive function addressed- see scanned forms- and if abnormal then additional documentation follows.   History of shingles.  He has pain relief with  gabapentin, not resolved but better than prev w/o med.   He has had slow but progressive neurologic changes that affected his gait and mobility.  He is in a wheelchair today at the office visit.  He has appropriate hardware at home to try to maintain as much independence as possible.  He has family support.  He will update me as needed.  He cannot think of any additional hardware at home that he would need right now.  He does have a skin tear on his left shin that has been slowly healing.  He wanted me to recheck today.  Hyperglycemia discussed with patient but point-of-care A1c not elevated.  Discussed with patient at office visit.  PMH and SH reviewed  Meds, vitals, and allergies reviewed.   ROS: Per HPI.  Unless specifically indicated otherwise in HPI, the patient denies:  General: fever. Eyes: acute vision changes ENT: sore throat Cardiovascular: chest pain Respiratory: SOB GI: vomiting GU: dysuria Musculoskeletal: acute back pain Derm: acute rash Neuro: acute motor dysfunction Psych: worsening mood Endocrine: polydipsia Heme: bleeding Allergy: hayfever  GEN: nad, alert and oriented HEENT: ncat NECK: supple w/o LA CV: rrr. PULM: ctab, no inc wob ABD: soft, +bs EXT: no edema SKIN: no acute rash but healing skin tear on L shin.  No sign of infection. In wheelchair.   Health Maintenance  Topic Date Due  . TETANUS/TDAP  11/08/2028  . INFLUENZA VACCINE  Completed  . PNA vac Low Risk Adult  Completed

## 2018-12-26 DIAGNOSIS — S81819S Laceration without foreign body, unspecified lower leg, sequela: Secondary | ICD-10-CM | POA: Insufficient documentation

## 2018-12-26 DIAGNOSIS — R739 Hyperglycemia, unspecified: Secondary | ICD-10-CM | POA: Insufficient documentation

## 2018-12-26 NOTE — Assessment & Plan Note (Addendum)
Appears to be healing at this point.  He will update me as needed.  Continue routine topical care.  Discussed with patient.

## 2018-12-26 NOTE — Assessment & Plan Note (Signed)
Advance directive-wife then both kids equally designated if patient were incapacitated.

## 2018-12-26 NOTE — Assessment & Plan Note (Signed)
He has pain relief with gabapentin, not resolved but better than prev w/o med.  Continue as is.  No ADE on med.

## 2018-12-26 NOTE — Assessment & Plan Note (Signed)
Flu 2020 Shingles discussed with patient.  I will check on this but it should be okay for the patient to get Shingrix. PNA up-to-date Tetanus 2020 Colon cancer screening deferred 2020 given pandemic. Prostate cancer screening deferred given age greater than 48. Advance directive-wife then both kids equally designated if patient were incapacitated. Cognitive function addressed- see scanned forms- and if abnormal then additional documentation follows.

## 2018-12-26 NOTE — Assessment & Plan Note (Signed)
He has had slow but progressive neurologic changes that affected his gait and mobility.  He is in a wheelchair today at the office visit.  He has appropriate hardware at home to try to maintain as much independence as possible.  He has family support.  He will update me as needed.  He cannot think of any additional hardware at home that he would need right now.

## 2018-12-26 NOTE — Assessment & Plan Note (Signed)
Hyperglycemia discussed with patient but point-of-care A1c not elevated.  Discussed with patient at office visit.

## 2019-02-14 ENCOUNTER — Telehealth: Payer: Self-pay

## 2019-02-14 ENCOUNTER — Telehealth: Payer: Self-pay | Admitting: *Deleted

## 2019-02-14 NOTE — Telephone Encounter (Signed)
Wife called and left a message to call her.  Called back. She called before to the office and was told that Dr. Alvy Bimler does not recommend the COVID vaccine for her husband. Is this true and why would he not get the vaccine?

## 2019-02-14 NOTE — Telephone Encounter (Signed)
He has T cell type blood condition and will not likely benefit from the vaccine, especially due to his ongoing treatment with methotrexate. If he wants to have it, I suggest holding off methotrexate for 1 month (stop 1 week before the vaccine and restart 1 week after 2nd dose)

## 2019-02-14 NOTE — Telephone Encounter (Signed)
Called and given below message. She verbalized understanding. 

## 2019-02-14 NOTE — Telephone Encounter (Signed)
Patient's wife called wanting to know if he should be able to take the covid vaccine?  See message already sent to oncology.

## 2019-02-14 NOTE — Telephone Encounter (Signed)
Called Pt with information from Dr. Brayton El spoke to his wife. Pt wife stated she understands with No questions.

## 2019-02-14 NOTE — Telephone Encounter (Signed)
Please see note from oncology, pasted below.  Thanks.  I'll defer to oncology.  =======================  Heath Lark, MD to Imhotep, Score, RN     02/14/19 10:13 AM Note   He has T cell type blood condition and will not likely benefit from the vaccine, especially due to his ongoing treatment with methotrexate. If he wants to have it, I suggest holding off methotrexate for 1 month (stop 1 week before the vaccine and restart 1 week after 2nd dose)

## 2019-02-17 ENCOUNTER — Other Ambulatory Visit: Payer: Self-pay | Admitting: Hematology and Oncology

## 2019-02-17 ENCOUNTER — Telehealth: Payer: Self-pay | Admitting: *Deleted

## 2019-02-17 NOTE — Telephone Encounter (Signed)
Telephone call to patients wife. They currently do not have any transportation. She will call her daughter to find out when is a good time for her to bring him to an appt. Patient has many tablets left right now. She was unable to locate the bottle to count them, however she remembers he just got it filled Monday. She is pretty sure he has about a month supply still.   Wife will call scheduling to make an appt for next week once she gets availability from her daughter.

## 2019-02-17 NOTE — Telephone Encounter (Signed)
-----   Message from Heath Lark, MD sent at 02/17/2019 10:17 AM EST ----- Regarding: methotrexate refill He needs appointment I did not see any labs done recently by PCP or anyone so I cannot refill his medications Can he come later today or next week? He needs family to come with him

## 2019-02-20 ENCOUNTER — Telehealth: Payer: Self-pay | Admitting: *Deleted

## 2019-02-20 NOTE — Telephone Encounter (Signed)
err

## 2019-02-23 ENCOUNTER — Telehealth: Payer: Self-pay | Admitting: Hematology and Oncology

## 2019-02-23 NOTE — Telephone Encounter (Signed)
Patient called to follow-up on 07/07 los, appointments have been made on 01/28.

## 2019-03-01 ENCOUNTER — Telehealth: Payer: Self-pay

## 2019-03-01 NOTE — Telephone Encounter (Signed)
Wife called to cancel appts for tomorrow due to possible snow in the forecast. Appts canceled and scheduling message sent to reschedule to next week per wife's request.

## 2019-03-02 ENCOUNTER — Ambulatory Visit: Payer: Medicare Other | Admitting: Hematology and Oncology

## 2019-03-02 ENCOUNTER — Other Ambulatory Visit: Payer: Medicare Other

## 2019-03-02 ENCOUNTER — Telehealth: Payer: Self-pay | Admitting: Hematology and Oncology

## 2019-03-02 NOTE — Telephone Encounter (Signed)
Rescheduled per 1/27 sch msg, pts req. Called and spoke with wife, confirmed 2/2 appt

## 2019-03-07 ENCOUNTER — Encounter: Payer: Self-pay | Admitting: Hematology and Oncology

## 2019-03-07 ENCOUNTER — Other Ambulatory Visit: Payer: Self-pay

## 2019-03-07 ENCOUNTER — Inpatient Hospital Stay: Payer: Medicare PPO

## 2019-03-07 ENCOUNTER — Inpatient Hospital Stay: Payer: Medicare PPO | Attending: Hematology and Oncology | Admitting: Hematology and Oncology

## 2019-03-07 VITALS — BP 118/67 | HR 64 | Temp 98.3°F | Resp 18 | Ht 76.0 in | Wt 196.8 lb

## 2019-03-07 DIAGNOSIS — Z7952 Long term (current) use of systemic steroids: Secondary | ICD-10-CM | POA: Insufficient documentation

## 2019-03-07 DIAGNOSIS — C91Z0 Other lymphoid leukemia not having achieved remission: Secondary | ICD-10-CM | POA: Diagnosis not present

## 2019-03-07 DIAGNOSIS — R296 Repeated falls: Secondary | ICD-10-CM | POA: Diagnosis not present

## 2019-03-07 DIAGNOSIS — G720 Drug-induced myopathy: Secondary | ICD-10-CM | POA: Insufficient documentation

## 2019-03-07 DIAGNOSIS — D472 Monoclonal gammopathy: Secondary | ICD-10-CM | POA: Insufficient documentation

## 2019-03-07 DIAGNOSIS — T380X5A Adverse effect of glucocorticoids and synthetic analogues, initial encounter: Secondary | ICD-10-CM | POA: Diagnosis not present

## 2019-03-07 DIAGNOSIS — R54 Age-related physical debility: Secondary | ICD-10-CM | POA: Insufficient documentation

## 2019-03-07 DIAGNOSIS — G6181 Chronic inflammatory demyelinating polyneuritis: Secondary | ICD-10-CM | POA: Diagnosis not present

## 2019-03-07 DIAGNOSIS — Z7982 Long term (current) use of aspirin: Secondary | ICD-10-CM

## 2019-03-07 DIAGNOSIS — D61818 Other pancytopenia: Secondary | ICD-10-CM | POA: Insufficient documentation

## 2019-03-07 DIAGNOSIS — Z79899 Other long term (current) drug therapy: Secondary | ICD-10-CM | POA: Diagnosis not present

## 2019-03-07 LAB — COMPREHENSIVE METABOLIC PANEL
ALT: 61 U/L — ABNORMAL HIGH (ref 0–44)
AST: 43 U/L — ABNORMAL HIGH (ref 15–41)
Albumin: 3.7 g/dL (ref 3.5–5.0)
Alkaline Phosphatase: 122 U/L (ref 38–126)
Anion gap: 8 (ref 5–15)
BUN: 21 mg/dL (ref 8–23)
CO2: 27 mmol/L (ref 22–32)
Calcium: 8.9 mg/dL (ref 8.9–10.3)
Chloride: 107 mmol/L (ref 98–111)
Creatinine, Ser: 1.1 mg/dL (ref 0.61–1.24)
GFR calc Af Amer: >60 mL/min (ref >60)
GFR calc non Af Amer: >60 mL/min (ref >60)
Glucose, Bld: 90 mg/dL (ref 70–99)
Potassium: 3.7 mmol/L (ref 3.5–5.1)
Sodium: 142 mmol/L (ref 135–145)
Total Bilirubin: 0.9 mg/dL (ref 0.3–1.2)
Total Protein: 6.3 g/dL — ABNORMAL LOW (ref 6.5–8.1)

## 2019-03-07 LAB — CBC WITH DIFFERENTIAL/PLATELET
Abs Immature Granulocytes: 0.01 10*3/uL (ref 0.00–0.07)
Basophils Absolute: 0 10*3/uL (ref 0.0–0.1)
Basophils Relative: 1 %
Eosinophils Absolute: 0.1 10*3/uL (ref 0.0–0.5)
Eosinophils Relative: 4 %
HCT: 32.1 % — ABNORMAL LOW (ref 39.0–52.0)
Hemoglobin: 10.7 g/dL — ABNORMAL LOW (ref 13.0–17.0)
Immature Granulocytes: 0 %
Lymphocytes Relative: 58 %
Lymphs Abs: 2.2 10*3/uL (ref 0.7–4.0)
MCH: 38.1 pg — ABNORMAL HIGH (ref 26.0–34.0)
MCHC: 33.3 g/dL (ref 30.0–36.0)
MCV: 114.2 fL — ABNORMAL HIGH (ref 80.0–100.0)
Monocytes Absolute: 0.4 10*3/uL (ref 0.1–1.0)
Monocytes Relative: 10 %
Neutro Abs: 1 10*3/uL — ABNORMAL LOW (ref 1.7–7.7)
Neutrophils Relative %: 27 %
Platelets: 230 10*3/uL (ref 150–400)
RBC: 2.81 MIL/uL — ABNORMAL LOW (ref 4.22–5.81)
RDW: 15.9 % — ABNORMAL HIGH (ref 11.5–15.5)
WBC: 3.8 10*3/uL — ABNORMAL LOW (ref 4.0–10.5)
nRBC: 0 % (ref 0.0–0.2)

## 2019-03-07 MED ORDER — METHOTREXATE 2.5 MG PO TABS: 10.0000 mg | ORAL_TABLET | ORAL | 11 refills | Status: DC

## 2019-03-07 NOTE — Progress Notes (Signed)
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[facility]) The patient had achieved hematological response with methotrexate for several years He has neutropenia today I recommend he hold his treatment this week and resume next week The patient is getting more frail I recommend reducing the dose of methotrexate to 10 mg weekly starting next week I will see him back in approximately 3-1/2 months for further follow-up  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 can be associated with autoimmune disorder He tolerated current dose prednisone well Currently, he takes prednisone 5 mg on Mondays, Wednesdays and Fridays and to take 10 mg for the rest of the week He has no recent flare of CIDP recently I recommend we continue the same  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) We discussed the risk of infection and neutropenic precaution We discussed the paucity of data of Covid vaccination in patients like him, with T-cell LGL and on chronic immunosuppressive therapy There is no contraindication for him to proceed with Covid vaccine but he will likely have reduced response to the vaccine and I recommend consideration to hold his treatment for approximately 4 to 6 weeks if he is interested to get the Covid vaccine soon.   Orders Placed This Encounter  Procedures  . CBC with  Differential/Platelet    Standing Status:   Future    Standing Expiration Date:   04/10/2020  . Comprehensive metabolic panel    Standing Status:   Future    Standing Expiration Date:   04/10/2020    All questions were answered. The patient knows to call the clinic with any problems, questions or concerns. The total time spent in the appointment was 30 minutes encounter with patients including review of chart and various tests results, discussions about plan of care and coordination of care plan   Heath Lark, MD 03/07/2019 1:28 PM  INTERVAL HISTORY: Please see below for problem oriented charting. He returns with his daughter for further follow-up He had a fall sometime around September or October of last year, accidental fall with skin laceration on the lower extremity on the left shin area which subsequently healed without complications He saw his primary care doctor in November His hemoglobin A1c is stable His appetite is fair No recent infection, fever or chills Denies new pain He has questions about Covid vaccine as well as shingles vaccine  SUMMARY OF ONCOLOGIC HISTORY: Oncology History  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[place])  12/11/2015 Bone Marrow Biopsy   Bone marrow biopsy confirmed diagnosis of Tcell LGL. T-cell receptor rearrangement study is still pending   12/19/2015 -  Chemotherapy   The patient was started on 10 mg once a week methotrexate. The dose is increased to 12.5 mg starting 01/16/16      REVIEW OF SYSTEMS:   Constitutional: Denies fevers, chills or abnormal weight loss Eyes: Denies blurriness of vision Ears, nose, mouth, throat, and face: Denies mucositis or sore throat Respiratory: Denies cough, dyspnea or wheezes  Cardiovascular: Denies palpitation, chest discomfort  Gastrointestinal:  Denies nausea, heartburn or change in bowel habits Skin: Denies abnormal skin rashes Lymphatics: Denies new lymphadenopathy or easy bruising Behavioral/Psych: Mood is  stable, no new changes  All other systems were reviewed with the patient and are negative.  I have reviewed the past medical history, past surgical history, social history and family history with the patient and they are unchanged from previous note.  ALLERGIES:  is allergic to sulfa antibiotics and sulfonamide derivatives.  MEDICATIONS:  Current Outpatient Medications  Medication Sig Dispense Refill  . acetaminophen (TYLENOL) 325 MG tablet Take 650 mg by mouth every 6 (six) hours as needed.    Marland Kitchen aspirin EC 81 MG tablet Take 162 mg by mouth daily.    Marland Kitchen CALCIUM CARB-CHOLECALCIFEROL PO Take 1,250 mg by mouth daily.    . Cholecalciferol (VITAMIN D3) 25 MCG (1000 UT) CAPS Take 2 capsules by mouth daily.    . Cyanocobalamin (VITAMIN B-12) 500 MCG SUBL Place 1 tablet under the tongue daily.     Marland Kitchen econazole nitrate 1 % cream Apply 1 application topically daily as needed.    . folic acid (FOLVITE) 544 MCG tablet Take 400 mcg by mouth daily.    Marland Kitchen gabapentin (NEURONTIN) 300 MG capsule Take 2 capsules (600 mg total) by mouth 2 (two) times daily. 360 capsule 3  . methotrexate (RHEUMATREX) 2.5 MG tablet Take 4 tablets (10 mg total) by mouth once a week. Caution:Chemotherapy. Protect from light. 20 tablet 11  . Multiple Vitamin (MULTIVITAMIN) tablet Take 1 tablet by mouth daily.      . predniSONE (DELTASONE) 5 MG tablet TAKE 1 TABLET BY MOUTH ON MONDAYS, WEDS, AND FRIDAYS, AND TAKE 2 TABS OTHER DAYS OF THE WEEK 90 tablet 1  . timolol (TIMOPTIC) 0.5 % ophthalmic solution Place 1 drop into both eyes at bedtime. One drop in each eye at bedtime.    . triamcinolone (KENALOG) 0.025 % cream Apply 1 application topically as needed.     No current facility-administered medications for this visit.    PHYSICAL EXAMINATION: ECOG PERFORMANCE STATUS: 2 - Symptomatic, <50% confined to bed  Vitals:   03/07/19 0926  BP: 118/67  Pulse: 64  Resp: 18  Temp: 98.3 F (36.8 C)  SpO2: 96%   Filed Weights    03/07/19 0926  Weight: 196 lb 12.8 oz (89.3 kg)    GENERAL:alert, no distress and comfortable.  He looks comfortable sitting on the wheelchair HEART: He has stable chronic bilateral lower extremity edema SKIN: Noted skin bruises  musculoskeletal:no cyanosis of digits and no clubbing  NEURO: alert & oriented x 3 with fluent speech, no focal motor/sensory deficits  LABORATORY DATA:  I have reviewed the data as listed    Component Value Date/Time   NA 142 03/07/2019 0855   NA 138 12/15/2016 1319   K 3.7 03/07/2019 0855   K 4.1 12/15/2016 1319   CL 107 03/07/2019 0855   CO2 27 03/07/2019 0855   CO2 24 12/15/2016 1319   GLUCOSE 90 03/07/2019 0855   GLUCOSE 123 12/15/2016 1319   BUN 21 03/07/2019 0855   BUN 13.7 12/15/2016 1319   CREATININE 1.10 03/07/2019 0855   CREATININE 1.2 12/15/2016 1319   CALCIUM 8.9 03/07/2019 0855   CALCIUM 8.8 12/15/2016 1319   PROT 6.3 (L) 03/07/2019 0855   PROT 6.5 12/15/2016 1319   PROT 6.2 12/15/2016 1319   ALBUMIN 3.7 03/07/2019 0855   ALBUMIN 3.5 12/15/2016 1319  AST 43 (H) 03/07/2019 0855   AST 66 (H) 12/15/2016 1319   ALT 61 (H) 03/07/2019 0855   ALT 90 (H) 12/15/2016 1319   ALKPHOS 122 03/07/2019 0855   ALKPHOS 116 12/15/2016 1319   BILITOT 0.9 03/07/2019 0855   BILITOT 1.13 12/15/2016 1319   GFRNONAA >60 03/07/2019 0855   GFRAA >60 03/07/2019 0855    No results found for: SPEP, UPEP  Lab Results  Component Value Date   WBC 3.8 (L) 03/07/2019   NEUTROABS 1.0 (L) 03/07/2019   HGB 10.7 (L) 03/07/2019   HCT 32.1 (L) 03/07/2019   MCV 114.2 (H) 03/07/2019   PLT 230 03/07/2019      Chemistry      Component Value Date/Time   NA 142 03/07/2019 0855   NA 138 12/15/2016 1319   K 3.7 03/07/2019 0855   K 4.1 12/15/2016 1319   CL 107 03/07/2019 0855   CO2 27 03/07/2019 0855   CO2 24 12/15/2016 1319   BUN 21 03/07/2019 0855   BUN 13.7 12/15/2016 1319   CREATININE 1.10 03/07/2019 0855   CREATININE 1.2 12/15/2016 1319       Component Value Date/Time   CALCIUM 8.9 03/07/2019 0855   CALCIUM 8.8 12/15/2016 1319   ALKPHOS 122 03/07/2019 0855   ALKPHOS 116 12/15/2016 1319   AST 43 (H) 03/07/2019 0855   AST 66 (H) 12/15/2016 1319   ALT 61 (H) 03/07/2019 0855   ALT 90 (H) 12/15/2016 1319   BILITOT 0.9 03/07/2019 0855   BILITOT 1.13 12/15/2016 1319

## 2019-03-07 NOTE — Assessment & Plan Note (Signed)
The patient had achieved hematological response with methotrexate for several years He has neutropenia today I recommend he hold his treatment this week and resume next week The patient is getting more frail I recommend reducing the dose of methotrexate to 10 mg weekly starting next week I will see him back in approximately 3-1/2 months for further follow-up

## 2019-03-07 NOTE — Assessment & Plan Note (Signed)
Result of his MGUS is pending I will call family with results next week

## 2019-03-07 NOTE — Assessment & Plan Note (Signed)
We discussed the risk of infection and neutropenic precaution We discussed the paucity of data of Covid vaccination in patients like him, with T-cell LGL and on chronic immunosuppressive therapy There is no contraindication for him to proceed with Covid vaccine but he will likely have reduced response to the vaccine and I recommend consideration to hold his treatment for approximately 4 to 6 weeks if he is interested to get the Covid vaccine soon.

## 2019-03-07 NOTE — Assessment & Plan Note (Signed)
The patient have recurrent falls several months ago with skin laceration He is getting more frail We discussed the risk and benefits of reducing the dose of treatment as above and he agreed We discussed the importance of strengthening exercises at home

## 2019-03-07 NOTE — Assessment & Plan Note (Signed)
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

## 2019-03-08 LAB — KAPPA/LAMBDA LIGHT CHAINS
Kappa free light chain: 25.1 mg/L — ABNORMAL HIGH (ref 3.3–19.4)
Kappa, lambda light chain ratio: 2.79 — ABNORMAL HIGH (ref 0.26–1.65)
Lambda free light chains: 9 mg/L (ref 5.7–26.3)

## 2019-03-09 ENCOUNTER — Telehealth: Payer: Self-pay | Admitting: Hematology and Oncology

## 2019-03-09 LAB — MULTIPLE MYELOMA PANEL, SERUM
Albumin SerPl Elph-Mcnc: 3.5 g/dL (ref 2.9–4.4)
Albumin/Glob SerPl: 1.3 (ref 0.7–1.7)
Alpha 1: 0.2 g/dL (ref 0.0–0.4)
Alpha2 Glob SerPl Elph-Mcnc: 0.5 g/dL (ref 0.4–1.0)
B-Globulin SerPl Elph-Mcnc: 0.7 g/dL (ref 0.7–1.3)
Gamma Glob SerPl Elph-Mcnc: 1.5 g/dL (ref 0.4–1.8)
Globulin, Total: 2.9 g/dL (ref 2.2–3.9)
IgA: 29 mg/dL — ABNORMAL LOW (ref 61–437)
IgG (Immunoglobin G), Serum: 1554 mg/dL (ref 603–1613)
IgM (Immunoglobulin M), Srm: 13 mg/dL — ABNORMAL LOW (ref 15–143)
M Protein SerPl Elph-Mcnc: 1.1 g/dL — ABNORMAL HIGH
Total Protein ELP: 6.4 g/dL (ref 6.0–8.5)

## 2019-03-09 NOTE — Telephone Encounter (Signed)
I talk with patient regarding schedule 5/25

## 2019-03-22 ENCOUNTER — Telehealth: Payer: Self-pay | Admitting: *Deleted

## 2019-03-22 NOTE — Telephone Encounter (Signed)
Telephone call to [name] and advised lab results as directed below. Daughter appreciated call.

## 2019-03-22 NOTE — Telephone Encounter (Signed)
-----   Message from Heath Lark, MD sent at 03/22/2019  7:44 AM EST ----- Regarding: MGUS panel Please call her daughter and let her know the MGUS panel is stable (I am checking once a year on this), nothing else needs to be done

## 2019-04-10 ENCOUNTER — Other Ambulatory Visit: Payer: Self-pay | Admitting: Hematology and Oncology

## 2019-06-27 ENCOUNTER — Telehealth: Payer: Self-pay | Admitting: Hematology and Oncology

## 2019-06-27 ENCOUNTER — Other Ambulatory Visit: Payer: Self-pay

## 2019-06-27 ENCOUNTER — Inpatient Hospital Stay: Payer: Medicare PPO | Admitting: Hematology and Oncology

## 2019-06-27 ENCOUNTER — Inpatient Hospital Stay: Payer: Medicare PPO | Attending: Hematology and Oncology

## 2019-06-27 ENCOUNTER — Encounter: Payer: Self-pay | Admitting: Hematology and Oncology

## 2019-06-27 DIAGNOSIS — M25512 Pain in left shoulder: Secondary | ICD-10-CM | POA: Diagnosis not present

## 2019-06-27 DIAGNOSIS — G720 Drug-induced myopathy: Secondary | ICD-10-CM

## 2019-06-27 DIAGNOSIS — Z7952 Long term (current) use of systemic steroids: Secondary | ICD-10-CM | POA: Insufficient documentation

## 2019-06-27 DIAGNOSIS — T380X5A Adverse effect of glucocorticoids and synthetic analogues, initial encounter: Secondary | ICD-10-CM | POA: Diagnosis not present

## 2019-06-27 DIAGNOSIS — D472 Monoclonal gammopathy: Secondary | ICD-10-CM | POA: Diagnosis not present

## 2019-06-27 DIAGNOSIS — D61818 Other pancytopenia: Secondary | ICD-10-CM

## 2019-06-27 DIAGNOSIS — G6181 Chronic inflammatory demyelinating polyneuritis: Secondary | ICD-10-CM | POA: Insufficient documentation

## 2019-06-27 DIAGNOSIS — C91Z0 Other lymphoid leukemia not having achieved remission: Secondary | ICD-10-CM

## 2019-06-27 DIAGNOSIS — M25511 Pain in right shoulder: Secondary | ICD-10-CM | POA: Diagnosis not present

## 2019-06-27 LAB — COMPREHENSIVE METABOLIC PANEL
ALT: 44 U/L (ref 0–44)
AST: 36 U/L (ref 15–41)
Albumin: 3.5 g/dL (ref 3.5–5.0)
Alkaline Phosphatase: 97 U/L (ref 38–126)
Anion gap: 6 (ref 5–15)
BUN: 16 mg/dL (ref 8–23)
CO2: 27 mmol/L (ref 22–32)
Calcium: 8.8 mg/dL — ABNORMAL LOW (ref 8.9–10.3)
Chloride: 108 mmol/L (ref 98–111)
Creatinine, Ser: 1.04 mg/dL (ref 0.61–1.24)
GFR calc Af Amer: >60 mL/min (ref >60)
GFR calc non Af Amer: >60 mL/min (ref >60)
Glucose, Bld: 100 mg/dL — ABNORMAL HIGH (ref 70–99)
Potassium: 3.9 mmol/L (ref 3.5–5.1)
Sodium: 141 mmol/L (ref 135–145)
Total Bilirubin: 0.9 mg/dL (ref 0.3–1.2)
Total Protein: 6.2 g/dL — ABNORMAL LOW (ref 6.5–8.1)

## 2019-06-27 LAB — CBC WITH DIFFERENTIAL/PLATELET
Abs Immature Granulocytes: 0.02 10*3/uL (ref 0.00–0.07)
Basophils Absolute: 0 10*3/uL (ref 0.0–0.1)
Basophils Relative: 1 %
Eosinophils Absolute: 0.2 10*3/uL (ref 0.0–0.5)
Eosinophils Relative: 4 %
HCT: 34.2 % — ABNORMAL LOW (ref 39.0–52.0)
Hemoglobin: 11.3 g/dL — ABNORMAL LOW (ref 13.0–17.0)
Immature Granulocytes: 0 %
Lymphocytes Relative: 61 %
Lymphs Abs: 3.1 10*3/uL (ref 0.7–4.0)
MCH: 37.9 pg — ABNORMAL HIGH (ref 26.0–34.0)
MCHC: 33 g/dL (ref 30.0–36.0)
MCV: 114.8 fL — ABNORMAL HIGH (ref 80.0–100.0)
Monocytes Absolute: 0.4 10*3/uL (ref 0.1–1.0)
Monocytes Relative: 9 %
Neutro Abs: 1.2 10*3/uL — ABNORMAL LOW (ref 1.7–7.7)
Neutrophils Relative %: 25 %
Platelets: 210 10*3/uL (ref 150–400)
RBC: 2.98 MIL/uL — ABNORMAL LOW (ref 4.22–5.81)
RDW: 16.2 % — ABNORMAL HIGH (ref 11.5–15.5)
WBC: 5 10*3/uL (ref 4.0–10.5)
nRBC: 0 % (ref 0.0–0.2)

## 2019-06-27 NOTE — Progress Notes (Signed)
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[facility]) The patient had achieved hematological response with methotrexate for several years He has mild neutropenia but overall much improved compared to previous visits He will continue 10 mg of methotrexate once a week I plan to see him again at the end of the year for further follow-up I encouraged him to proceed with Covid vaccination despite his diagnosis and ongoing treatment with methotrexate  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 can be associated with autoimmune disorder He tolerated current dose prednisone well Currently, he takes prednisone 5 mg on Mondays, Wednesdays and Fridays and to take 10 mg for the rest of the week He has no recent flare of CIDP recently I recommend we continue the same  MGUS (monoclonal gammopathy of unknown significance) His last MGUS panel was stable We will continue to check once a year  Steroid-induced myopathy He has significant bilateral shoulder pain lately, secondary to overuse injury The patient have steroid induced myopathy and is using his upper body a lot for activities of daily living I recommend we continue conservative approach with over-the-counter analgesics, heat pad and rest If it gets worse, I recommend consideration for orthopedic consultation for localized joint injection if appropriate  Pancytopenia, acquired ([place]) This is much improved since we reduce the dose of methotrexate His LFTs are also normal We will continue to observe carefully   No orders of the defined types were placed in this encounter.   All questions were answered. The patient knows to call the clinic with any problems, questions or concerns. The total time spent in the  appointment was 20 minutes encounter with patients including review of chart and various tests results, discussions about plan of care and coordination of care plan   Michael Lark, MD 06/27/2019 12:03 PM  INTERVAL HISTORY: Please see below for problem oriented charting. He is here accompanied by his daughter He is doing well He denies recent infection, fever or chills No recent falls The patient continues to be active but has been complaining of lot of bilateral shoulder pain lately His chronic neuropathy is the same Postherpetic neuralgia is about the same  SUMMARY OF ONCOLOGIC HISTORY: Oncology History  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[place])  12/11/2015 Bone Marrow Biopsy   Bone marrow biopsy confirmed diagnosis of Tcell LGL. T-cell receptor rearrangement study is still pending   12/19/2015 -  Chemotherapy   The patient was started on 10 mg once a week methotrexate. The dose is increased to 12.5 mg starting 01/16/16      REVIEW OF SYSTEMS:   Constitutional: Denies fevers, chills or abnormal weight loss Eyes: Denies blurriness of vision Ears, nose, mouth, throat, and face: Denies mucositis or sore throat Respiratory: Denies cough, dyspnea or wheezes Cardiovascular: Denies palpitation, chest discomfort or lower extremity swelling Gastrointestinal:  Denies nausea, heartburn or change in bowel habits Skin: Denies abnormal skin rashes Lymphatics: Denies new lymphadenopathy or easy bruising Neurological:Denies numbness, tingling or new weaknesses Behavioral/Psych: Mood is stable, no new changes  All other systems were reviewed with the patient and are negative.  I have reviewed the past medical history, past surgical history, social history and family history with the patient and they are unchanged from previous note.  ALLERGIES:  is allergic to sulfa antibiotics and sulfonamide derivatives.  MEDICATIONS:  Current  Outpatient Medications  Medication Sig Dispense Refill  .  acetaminophen (TYLENOL) 325 MG tablet Take 650 mg by mouth every 6 (six) hours as needed.    Marland Kitchen aspirin EC 81 MG tablet Take 162 mg by mouth daily.    Marland Kitchen CALCIUM CARB-CHOLECALCIFEROL PO Take 1,250 mg by mouth daily.    . Cholecalciferol (VITAMIN D3) 25 MCG (1000 UT) CAPS Take 2 capsules by mouth daily.    . Cyanocobalamin (VITAMIN B-12) 500 MCG SUBL Place 1 tablet under the tongue daily.     Marland Kitchen econazole nitrate 1 % cream Apply 1 application topically daily as needed.    . folic acid (FOLVITE) 335 MCG tablet Take 400 mcg by mouth daily.    Marland Kitchen gabapentin (NEURONTIN) 300 MG capsule Take 2 capsules (600 mg total) by mouth 2 (two) times daily. 360 capsule 3  . methotrexate (RHEUMATREX) 2.5 MG tablet Take 4 tablets (10 mg total) by mouth once a week. Caution:Chemotherapy. Protect from light. 20 tablet 11  . Multiple Vitamin (MULTIVITAMIN) tablet Take 1 tablet by mouth daily.      . predniSONE (DELTASONE) 5 MG tablet TAKE 1 TABLET BY MOUTH ON MONDAYS, WEDS, AND FRIDAYS, AND TAKE 2 TABS OTHER DAYS OF THE WEEK 90 tablet 1  . timolol (TIMOPTIC) 0.5 % ophthalmic solution Place 1 drop into both eyes at bedtime. One drop in each eye at bedtime.    . triamcinolone (KENALOG) 0.025 % cream Apply 1 application topically as needed.     No current facility-administered medications for this visit.    PHYSICAL EXAMINATION: ECOG PERFORMANCE STATUS: 2 - Symptomatic, <50% confined to bed  Vitals:   06/27/19 0931  BP: (!) 121/48  Pulse: 66  Resp: 18  Temp: 98.1 F (36.7 C)  SpO2: 98%   Filed Weights   06/27/19 0931  Weight: 200 lb 6.4 oz (90.9 kg)    GENERAL:alert, no distress and comfortable NEURO: alert & oriented x 3 with fluent speech, no focal motor/sensory deficits  LABORATORY DATA:  I have reviewed the data as listed    Component Value Date/Time   NA 141 06/27/2019 0857   NA 138 12/15/2016 1319   K 3.9 06/27/2019 0857   K 4.1 12/15/2016 1319   CL 108 06/27/2019 0857   CO2 27 06/27/2019 0857    CO2 24 12/15/2016 1319   GLUCOSE 100 (H) 06/27/2019 0857   GLUCOSE 123 12/15/2016 1319   BUN 16 06/27/2019 0857   BUN 13.7 12/15/2016 1319   CREATININE 1.04 06/27/2019 0857   CREATININE 1.2 12/15/2016 1319   CALCIUM 8.8 (L) 06/27/2019 0857   CALCIUM 8.8 12/15/2016 1319   PROT 6.2 (L) 06/27/2019 0857   PROT 6.5 12/15/2016 1319   PROT 6.2 12/15/2016 1319   ALBUMIN 3.5 06/27/2019 0857   ALBUMIN 3.5 12/15/2016 1319   AST 36 06/27/2019 0857   AST 66 (H) 12/15/2016 1319   ALT 44 06/27/2019 0857   ALT 90 (H) 12/15/2016 1319   ALKPHOS 97 06/27/2019 0857   ALKPHOS 116 12/15/2016 1319   BILITOT 0.9 06/27/2019 0857   BILITOT 1.13 12/15/2016 1319   GFRNONAA >60 06/27/2019 0857   GFRAA >60 06/27/2019 0857    No results found for: SPEP, UPEP  Lab Results  Component Value Date   WBC 5.0 06/27/2019   NEUTROABS 1.2 (L) 06/27/2019   HGB 11.3 (L) 06/27/2019   HCT 34.2 (L) 06/27/2019   MCV 114.8 (H) 06/27/2019   PLT 210 06/27/2019  Chemistry      Component Value Date/Time   NA 141 06/27/2019 0857   NA 138 12/15/2016 1319   K 3.9 06/27/2019 0857   K 4.1 12/15/2016 1319   CL 108 06/27/2019 0857   CO2 27 06/27/2019 0857   CO2 24 12/15/2016 1319   BUN 16 06/27/2019 0857   BUN 13.7 12/15/2016 1319   CREATININE 1.04 06/27/2019 0857   CREATININE 1.2 12/15/2016 1319      Component Value Date/Time   CALCIUM 8.8 (L) 06/27/2019 0857   CALCIUM 8.8 12/15/2016 1319   ALKPHOS 97 06/27/2019 0857   ALKPHOS 116 12/15/2016 1319   AST 36 06/27/2019 0857   AST 66 (H) 12/15/2016 1319   ALT 44 06/27/2019 0857   ALT 90 (H) 12/15/2016 1319   BILITOT 0.9 06/27/2019 0857   BILITOT 1.13 12/15/2016 1319

## 2019-06-27 NOTE — Assessment & Plan Note (Signed)
His last MGUS panel was stable We will continue to check once a year 

## 2019-06-27 NOTE — Telephone Encounter (Signed)
Scheduled per 5/25 sch message. Mailing calendar to pt.

## 2019-06-27 NOTE — Assessment & Plan Note (Signed)
He has significant bilateral shoulder pain lately, secondary to overuse injury The patient have steroid induced myopathy and is using his upper body a lot for activities of daily living I recommend we continue conservative approach with over-the-counter analgesics, heat pad and rest If it gets worse, I recommend consideration for orthopedic consultation for localized joint injection if appropriate

## 2019-06-27 NOTE — Assessment & Plan Note (Signed)
The patient had achieved hematological response with methotrexate for several years He has mild neutropenia but overall much improved compared to previous visits He will continue 10 mg of methotrexate once a week I plan to see him again at the end of the year for further follow-up I encouraged him to proceed with Covid vaccination despite his diagnosis and ongoing treatment with methotrexate

## 2019-06-27 NOTE — Assessment & Plan Note (Signed)
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

## 2019-06-27 NOTE — Assessment & Plan Note (Signed)
This is much improved since we reduce the dose of methotrexate His LFTs are also normal We will continue to observe carefully

## 2019-07-25 ENCOUNTER — Telehealth: Payer: Self-pay | Admitting: Family Medicine

## 2019-07-25 ENCOUNTER — Telehealth: Payer: Self-pay

## 2019-07-25 NOTE — Telephone Encounter (Signed)
Left detailed message on voicemail.  

## 2019-07-25 NOTE — Telephone Encounter (Signed)
Called back and given below message to wife. She verbalized understanding.

## 2019-07-25 NOTE — Telephone Encounter (Signed)
Patient's wife returned call. She stated she received the message and wanted to say thank you for answering her questions

## 2019-07-25 NOTE — Telephone Encounter (Signed)
Pt wife is calling requesting recommendations - pt is wanting to get the Covid vaccine and wants to ensure that this is 100% safe with his cancer diagnosis and any treatment for ongoing cancer, contraindication with meds.     Please advise, thanks.

## 2019-07-25 NOTE — Telephone Encounter (Signed)
Recommendation is to get the covid vaccine.  Hematology also recs that, based on last OV note.   There is no medicine or intervention that is 100% safe, but the odds are vastly in favor of benefit over risk.  Thanks.

## 2019-07-25 NOTE — Telephone Encounter (Signed)
Returned call to wife. She is clarifying instructions on Methotrexate with Covid vaccine. Does he need to stop Methotrexate? If so, how long? She is trying to schedule appt.

## 2019-07-25 NOTE — Telephone Encounter (Signed)
No need to stop MTX Just proceed with the vaccine

## 2019-08-19 ENCOUNTER — Other Ambulatory Visit: Payer: Self-pay | Admitting: Hematology and Oncology

## 2019-11-27 ENCOUNTER — Inpatient Hospital Stay: Payer: Medicare PPO

## 2019-11-27 ENCOUNTER — Inpatient Hospital Stay: Payer: Medicare PPO | Attending: Hematology and Oncology | Admitting: Hematology and Oncology

## 2019-11-27 ENCOUNTER — Encounter: Payer: Self-pay | Admitting: Hematology and Oncology

## 2019-11-27 ENCOUNTER — Other Ambulatory Visit: Payer: Self-pay | Admitting: Hematology and Oncology

## 2019-11-27 ENCOUNTER — Other Ambulatory Visit: Payer: Self-pay

## 2019-11-27 VITALS — BP 114/55 | HR 58 | Temp 98.0°F | Resp 18 | Wt 198.0 lb

## 2019-11-27 DIAGNOSIS — Z7952 Long term (current) use of systemic steroids: Secondary | ICD-10-CM | POA: Diagnosis not present

## 2019-11-27 DIAGNOSIS — C91Z0 Other lymphoid leukemia not having achieved remission: Secondary | ICD-10-CM

## 2019-11-27 DIAGNOSIS — G6181 Chronic inflammatory demyelinating polyneuritis: Secondary | ICD-10-CM | POA: Insufficient documentation

## 2019-11-27 DIAGNOSIS — Z23 Encounter for immunization: Secondary | ICD-10-CM | POA: Insufficient documentation

## 2019-11-27 DIAGNOSIS — Z299 Encounter for prophylactic measures, unspecified: Secondary | ICD-10-CM

## 2019-11-27 DIAGNOSIS — D649 Anemia, unspecified: Secondary | ICD-10-CM | POA: Insufficient documentation

## 2019-11-27 DIAGNOSIS — T380X5A Adverse effect of glucocorticoids and synthetic analogues, initial encounter: Secondary | ICD-10-CM | POA: Insufficient documentation

## 2019-11-27 DIAGNOSIS — D472 Monoclonal gammopathy: Secondary | ICD-10-CM | POA: Diagnosis not present

## 2019-11-27 DIAGNOSIS — G720 Drug-induced myopathy: Secondary | ICD-10-CM | POA: Diagnosis not present

## 2019-11-27 DIAGNOSIS — T380X5D Adverse effect of glucocorticoids and synthetic analogues, subsequent encounter: Secondary | ICD-10-CM

## 2019-11-27 LAB — COMPREHENSIVE METABOLIC PANEL
ALT: 43 U/L (ref 0–44)
AST: 30 U/L (ref 15–41)
Albumin: 3.5 g/dL (ref 3.5–5.0)
Alkaline Phosphatase: 112 U/L (ref 38–126)
Anion gap: 7 (ref 5–15)
BUN: 20 mg/dL (ref 8–23)
CO2: 26 mmol/L (ref 22–32)
Calcium: 9.4 mg/dL (ref 8.9–10.3)
Chloride: 108 mmol/L (ref 98–111)
Creatinine, Ser: 1.01 mg/dL (ref 0.61–1.24)
GFR, Estimated: >60 mL/min (ref >60)
Glucose, Bld: 97 mg/dL (ref 70–99)
Potassium: 3.7 mmol/L (ref 3.5–5.1)
Sodium: 141 mmol/L (ref 135–145)
Total Bilirubin: 0.9 mg/dL (ref 0.3–1.2)
Total Protein: 6.2 g/dL — ABNORMAL LOW (ref 6.5–8.1)

## 2019-11-27 LAB — CBC WITH DIFFERENTIAL/PLATELET
Abs Immature Granulocytes: 0.01 10*3/uL (ref 0.00–0.07)
Basophils Absolute: 0 10*3/uL (ref 0.0–0.1)
Basophils Relative: 1 %
Eosinophils Absolute: 0.2 10*3/uL (ref 0.0–0.5)
Eosinophils Relative: 4 %
HCT: 32 % — ABNORMAL LOW (ref 39.0–52.0)
Hemoglobin: 11 g/dL — ABNORMAL LOW (ref 13.0–17.0)
Immature Granulocytes: 0 %
Lymphocytes Relative: 62 %
Lymphs Abs: 2.5 10*3/uL (ref 0.7–4.0)
MCH: 38.6 pg — ABNORMAL HIGH (ref 26.0–34.0)
MCHC: 34.4 g/dL (ref 30.0–36.0)
MCV: 112.3 fL — ABNORMAL HIGH (ref 80.0–100.0)
Monocytes Absolute: 0.4 10*3/uL (ref 0.1–1.0)
Monocytes Relative: 9 %
Neutro Abs: 1 10*3/uL — ABNORMAL LOW (ref 1.7–7.7)
Neutrophils Relative %: 24 %
Platelets: 213 10*3/uL (ref 150–400)
RBC: 2.85 MIL/uL — ABNORMAL LOW (ref 4.22–5.81)
RDW: 16.3 % — ABNORMAL HIGH (ref 11.5–15.5)
WBC: 4.1 10*3/uL (ref 4.0–10.5)
nRBC: 0 % (ref 0.0–0.2)

## 2019-11-27 MED ORDER — INFLUENZA VAC A&B SA ADJ QUAD 0.5 ML IM PRSY
PREFILLED_SYRINGE | INTRAMUSCULAR | Status: AC
  Filled 2019-11-27: qty 0.5

## 2019-11-27 MED ORDER — INFLUENZA VAC A&B SA ADJ QUAD 0.5 ML IM PRSY
0.5000 mL | PREFILLED_SYRINGE | Freq: Once | INTRAMUSCULAR | Status: AC
  Administered 2019-11-27: 0.5 mL via INTRAMUSCULAR

## 2019-11-27 NOTE — Assessment & Plan Note (Signed)
The patient has steroid induced myopathy and is using his upper body a lot for activities of daily living I recommend we continue conservative approach with over-the-counter analgesics, heat pad and rest

## 2019-11-27 NOTE — Assessment & Plan Note (Signed)
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

## 2019-11-27 NOTE — Progress Notes (Signed)
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 He does not have any prior allergic reactions to influenza vaccination. He agrees to proceed with influenza vaccination today and we will administer it today at the clinic.    Orders Placed This Encounter  Procedures  . Kappa/lambda light chains    Standing Status:   Future    Standing Expiration Date:   11/26/2020  . Multiple Myeloma Panel (SPEP&IFE w/QIG)    Standing Status:   Future    Standing Expiration Date:   11/26/2020    All questions were answered. The patient knows to call the clinic with any problems, questions or concerns. The total  time spent in the appointment was 20 minutes encounter with patients including review of chart and various tests results, discussions about plan of care and coordination of care plan   Heath Lark, MD 11/27/2019 10:10 AM  INTERVAL HISTORY: Please see below for problem oriented charting. 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 T-cell receptor rearrangement study is still pending   12/19/2015 -  Chemotherapy   The patient was started on 10 mg once a week methotrexate. The dose is increased to 12.5 mg starting 01/16/16      REVIEW OF SYSTEMS:   Constitutional: Denies fevers, chills or abnormal weight loss Eyes: Denies blurriness of vision Ears, nose, mouth, throat, and face: Denies mucositis or sore throat Respiratory: Denies cough, dyspnea or wheezes Cardiovascular: Denies palpitation, chest discomfort or lower extremity swelling Gastrointestinal:  Denies nausea, heartburn or change in bowel habits Skin: Denies abnormal skin rashes Lymphatics: Denies new lymphadenopathy or easy bruising Behavioral/Psych: Mood is stable, no new changes  All other systems were reviewed with the patient and are negative.  I have reviewed the past medical history, past surgical history, social history and family history with the patient and they are unchanged from previous note.  ALLERGIES:  is allergic to sulfa antibiotics and sulfonamide derivatives.  MEDICATIONS:  Current Outpatient Medications  Medication Sig Dispense Refill  . acetaminophen (TYLENOL) 325 MG tablet Take 650 mg by mouth every 6 (six) hours as  needed.    Marland Kitchen aspirin EC 81 MG tablet Take 162 mg by mouth daily.    Marland Kitchen CALCIUM CARB-CHOLECALCIFEROL PO Take 1,250 mg by mouth daily.    . Cholecalciferol (VITAMIN D3) 25 MCG (1000 UT) CAPS Take 2 capsules by mouth daily.    . Cyanocobalamin (VITAMIN B-12) 500 MCG SUBL Place 1 tablet under the tongue daily.     Marland Kitchen econazole nitrate 1 % cream Apply 1 application topically daily as needed.    . folic acid (FOLVITE) 537 MCG tablet Take 400 mcg by mouth daily.    Marland Kitchen gabapentin (NEURONTIN) 300 MG capsule Take 2 capsules (600 mg total) by mouth 2 (two) times daily. 360 capsule 3  . methotrexate (RHEUMATREX) 2.5 MG tablet Take 4 tablets (10 mg total) by mouth once a week. Caution:Chemotherapy. Protect from light. 20 tablet 11  . Multiple Vitamin (MULTIVITAMIN) tablet Take 1 tablet by mouth daily.      . predniSONE (DELTASONE) 5 MG tablet TAKE 1 TABLET BY MOUTH ON MONDAYS, WEDS, AND FRIDAYS, AND TAKE 2 TABS OTHER DAYS OF THE WEEK 90 tablet 1  . timolol (TIMOPTIC) 0.5 % ophthalmic solution Place 1 drop into both eyes at bedtime. One drop in each eye at bedtime.    . triamcinolone (KENALOG) 0.025 % cream Apply 1 application topically as needed.     No current facility-administered medications for this visit.    PHYSICAL EXAMINATION: ECOG PERFORMANCE STATUS: 1 - Symptomatic but completely ambulatory  Vitals:   11/27/19 0921  BP: (!) 114/55  Pulse: (!) 58  Resp: 18  Temp: 98 F (36.7 C)  SpO2: 98%   Filed Weights   11/27/19 0921  Weight: 198 lb (89.8 kg)    GENERAL:alert, no distress and comfortable.  Noted mild muscle wasting SKIN: skin color, texture, turgor are normal, no rashes or significant lesions EYES: normal, Conjunctiva are pink and non-injected, sclera clear OROPHARYNX:no exudate, no erythema and lips, buccal mucosa, and tongue normal  NECK: supple, thyroid normal size, non-tender, without nodularity LYMPH:  no palpable lymphadenopathy in the cervical, axillary or inguinal LUNGS:  clear to auscultation and percussion with normal breathing effort HEART: regular rate & rhythm and no murmurs with mild bilateral ABDOMEN:abdomen soft, non-tender and normal bowel sounds Musculoskeletal:no cyanosis of digits and no clubbing  NEURO: alert & oriented x 3 with fluent speech, no focal motor/sensory deficits  LABORATORY DATA:  I have reviewed the data as listed    Component Value Date/Time   NA 141 11/27/2019 0909   NA 138 12/15/2016 1319   K 3.7 11/27/2019 0909   K 4.1 12/15/2016 1319   CL 108 11/27/2019 0909   CO2 26 11/27/2019 0909   CO2 24 12/15/2016 1319   GLUCOSE 97 11/27/2019 0909   GLUCOSE 123 12/15/2016 1319   BUN 20 11/27/2019 0909   BUN 13.7 12/15/2016 1319   CREATININE 1.01 11/27/2019 0909   CREATININE 1.2 12/15/2016 1319   CALCIUM 9.4 11/27/2019 0909   CALCIUM 8.8 12/15/2016 1319   PROT 6.2 (L) 11/27/2019 0909   PROT 6.5 12/15/2016 1319   PROT 6.2 12/15/2016 1319   ALBUMIN 3.5 11/27/2019 0909   ALBUMIN 3.5 12/15/2016 1319   AST 30 11/27/2019 0909   AST 66 (H) 12/15/2016 1319   ALT 43 11/27/2019 0909   ALT 90 (H) 12/15/2016 1319  ALKPHOS 112 11/27/2019 0909   ALKPHOS 116 12/15/2016 1319   BILITOT 0.9 11/27/2019 0909   BILITOT 1.13 12/15/2016 1319   GFRNONAA >60 11/27/2019 0909   GFRAA >60 06/27/2019 0857    No results found for: SPEP, UPEP  Lab Results  Component Value Date   WBC 4.1 11/27/2019   NEUTROABS 1.0 (L) 11/27/2019   HGB 11.0 (L) 11/27/2019   HCT 32.0 (L) 11/27/2019   MCV 112.3 (H) 11/27/2019   PLT 213 11/27/2019      Chemistry      Component Value Date/Time   NA 141 11/27/2019 0909   NA 138 12/15/2016 1319   K 3.7 11/27/2019 0909   K 4.1 12/15/2016 1319   CL 108 11/27/2019 0909   CO2 26 11/27/2019 0909   CO2 24 12/15/2016 1319   BUN 20 11/27/2019 0909   BUN 13.7 12/15/2016 1319   CREATININE 1.01 11/27/2019 0909   CREATININE 1.2 12/15/2016 1319      Component Value Date/Time   CALCIUM 9.4 11/27/2019 0909    CALCIUM 8.8 12/15/2016 1319   ALKPHOS 112 11/27/2019 0909   ALKPHOS 116 12/15/2016 1319   AST 30 11/27/2019 0909   AST 66 (H) 12/15/2016 1319   ALT 43 11/27/2019 0909   ALT 90 (H) 12/15/2016 1319   BILITOT 0.9 11/27/2019 0909   BILITOT 1.13 12/15/2016 1319

## 2019-11-27 NOTE — Assessment & Plan Note (Signed)
We discussed the importance of preventive care and reviewed the vaccination programs. He does not have any prior allergic reactions to influenza vaccination. He agrees to proceed with influenza vaccination today and we will administer it today at the clinic.  

## 2019-11-27 NOTE — Assessment & Plan Note (Signed)
The patient had achieved hematological response with methotrexate for several years With minor dose adjustment from 12.5 mg to 10 mg of methotrexate, his leukopenia has resolved.  He is only mildly anemic We will continue treatment indefinitely I will see him back in 6 months for further follow-up

## 2019-11-27 NOTE — Assessment & Plan Note (Signed)
His last MGUS panel was stable We will continue to check once a year

## 2019-12-10 ENCOUNTER — Other Ambulatory Visit: Payer: Self-pay | Admitting: Hematology and Oncology

## 2020-03-05 ENCOUNTER — Other Ambulatory Visit: Payer: Self-pay | Admitting: Hematology and Oncology

## 2020-03-05 ENCOUNTER — Other Ambulatory Visit: Payer: Self-pay | Admitting: Family Medicine

## 2020-03-05 NOTE — Telephone Encounter (Signed)
Refill request for Gabapentin 300 mg caps  LOV - 12/22/18 Next OV - not scheduled Last refilled - 12/22/18 #360/3

## 2020-03-06 NOTE — Telephone Encounter (Signed)
Sent. Thanks.  Please check with patient.  Reasonable for office visit when possible but I get the point of not doing this in the middle of the Covid surge.

## 2020-03-08 NOTE — Telephone Encounter (Signed)
Spoke with patient and advised patient rx was refilled and an appt is needed sometime soon. Patient states he will go over his calendar and see when he can make an appt and call back.

## 2020-03-27 DIAGNOSIS — B354 Tinea corporis: Secondary | ICD-10-CM | POA: Diagnosis not present

## 2020-03-27 DIAGNOSIS — D2261 Melanocytic nevi of right upper limb, including shoulder: Secondary | ICD-10-CM | POA: Diagnosis not present

## 2020-03-27 DIAGNOSIS — L570 Actinic keratosis: Secondary | ICD-10-CM | POA: Diagnosis not present

## 2020-03-27 DIAGNOSIS — D2262 Melanocytic nevi of left upper limb, including shoulder: Secondary | ICD-10-CM | POA: Diagnosis not present

## 2020-03-27 DIAGNOSIS — D2272 Melanocytic nevi of left lower limb, including hip: Secondary | ICD-10-CM | POA: Diagnosis not present

## 2020-03-27 DIAGNOSIS — D485 Neoplasm of uncertain behavior of skin: Secondary | ICD-10-CM | POA: Diagnosis not present

## 2020-03-27 DIAGNOSIS — Z85828 Personal history of other malignant neoplasm of skin: Secondary | ICD-10-CM | POA: Diagnosis not present

## 2020-03-27 DIAGNOSIS — X32XXXA Exposure to sunlight, initial encounter: Secondary | ICD-10-CM | POA: Diagnosis not present

## 2020-04-01 ENCOUNTER — Other Ambulatory Visit: Payer: Self-pay | Admitting: Hematology and Oncology

## 2020-04-17 DIAGNOSIS — D045 Carcinoma in situ of skin of trunk: Secondary | ICD-10-CM | POA: Diagnosis not present

## 2020-05-21 ENCOUNTER — Inpatient Hospital Stay: Payer: Medicare PPO | Attending: Hematology and Oncology | Admitting: Hematology and Oncology

## 2020-05-21 ENCOUNTER — Encounter: Payer: Self-pay | Admitting: Hematology and Oncology

## 2020-05-21 ENCOUNTER — Other Ambulatory Visit: Payer: Self-pay

## 2020-05-21 ENCOUNTER — Inpatient Hospital Stay: Payer: Medicare PPO

## 2020-05-21 DIAGNOSIS — R7989 Other specified abnormal findings of blood chemistry: Secondary | ICD-10-CM | POA: Diagnosis not present

## 2020-05-21 DIAGNOSIS — T380X5A Adverse effect of glucocorticoids and synthetic analogues, initial encounter: Secondary | ICD-10-CM

## 2020-05-21 DIAGNOSIS — C91Z0 Other lymphoid leukemia not having achieved remission: Secondary | ICD-10-CM | POA: Insufficient documentation

## 2020-05-21 DIAGNOSIS — D472 Monoclonal gammopathy: Secondary | ICD-10-CM

## 2020-05-21 DIAGNOSIS — Z7952 Long term (current) use of systemic steroids: Secondary | ICD-10-CM | POA: Diagnosis not present

## 2020-05-21 DIAGNOSIS — Z79899 Other long term (current) drug therapy: Secondary | ICD-10-CM | POA: Diagnosis not present

## 2020-05-21 DIAGNOSIS — G6181 Chronic inflammatory demyelinating polyneuritis: Secondary | ICD-10-CM | POA: Insufficient documentation

## 2020-05-21 DIAGNOSIS — R531 Weakness: Secondary | ICD-10-CM | POA: Diagnosis not present

## 2020-05-21 DIAGNOSIS — G720 Drug-induced myopathy: Secondary | ICD-10-CM | POA: Diagnosis not present

## 2020-05-21 DIAGNOSIS — T380X5D Adverse effect of glucocorticoids and synthetic analogues, subsequent encounter: Secondary | ICD-10-CM | POA: Diagnosis not present

## 2020-05-21 LAB — COMPREHENSIVE METABOLIC PANEL
ALT: 55 U/L — ABNORMAL HIGH (ref 0–44)
AST: 39 U/L (ref 15–41)
Albumin: 3.8 g/dL (ref 3.5–5.0)
Alkaline Phosphatase: 117 U/L (ref 38–126)
Anion gap: 10 (ref 5–15)
BUN: 18 mg/dL (ref 8–23)
CO2: 26 mmol/L (ref 22–32)
Calcium: 8.9 mg/dL (ref 8.9–10.3)
Chloride: 106 mmol/L (ref 98–111)
Creatinine, Ser: 1.1 mg/dL (ref 0.61–1.24)
GFR, Estimated: >60 mL/min (ref >60)
Glucose, Bld: 96 mg/dL (ref 70–99)
Potassium: 3.9 mmol/L (ref 3.5–5.1)
Sodium: 142 mmol/L (ref 135–145)
Total Bilirubin: 1 mg/dL (ref 0.3–1.2)
Total Protein: 6.5 g/dL (ref 6.5–8.1)

## 2020-05-21 LAB — CBC WITH DIFFERENTIAL/PLATELET
Abs Immature Granulocytes: 0.01 10*3/uL (ref 0.00–0.07)
Basophils Absolute: 0 10*3/uL (ref 0.0–0.1)
Basophils Relative: 1 %
Eosinophils Absolute: 0.2 10*3/uL (ref 0.0–0.5)
Eosinophils Relative: 4 %
HCT: 32.8 % — ABNORMAL LOW (ref 39.0–52.0)
Hemoglobin: 11.1 g/dL — ABNORMAL LOW (ref 13.0–17.0)
Immature Granulocytes: 0 %
Lymphocytes Relative: 66 %
Lymphs Abs: 2.8 10*3/uL (ref 0.7–4.0)
MCH: 39.1 pg — ABNORMAL HIGH (ref 26.0–34.0)
MCHC: 33.8 g/dL (ref 30.0–36.0)
MCV: 115.5 fL — ABNORMAL HIGH (ref 80.0–100.0)
Monocytes Absolute: 0.3 10*3/uL (ref 0.1–1.0)
Monocytes Relative: 7 %
Neutro Abs: 0.9 10*3/uL — ABNORMAL LOW (ref 1.7–7.7)
Neutrophils Relative %: 22 %
Platelets: 237 10*3/uL (ref 150–400)
RBC: 2.84 MIL/uL — ABNORMAL LOW (ref 4.22–5.81)
RDW: 16.3 % — ABNORMAL HIGH (ref 11.5–15.5)
WBC: 4.2 10*3/uL (ref 4.0–10.5)
nRBC: 0 % (ref 0.0–0.2)

## 2020-05-21 MED ORDER — METHOTREXATE 2.5 MG PO TABS: 7.5000 mg | ORAL_TABLET | ORAL | 11 refills | Status: DC

## 2020-05-21 NOTE — Assessment & Plan Note (Signed)
His MGUS panel is pending I will call his daughter with test results If MGUS is stable, I will check it once a year

## 2020-05-21 NOTE — Assessment & Plan Note (Signed)
I noticed that the patient has progressive neutropenia with treatment His LFTs is intermittently elevated Given stability of his LGL control with methotrexate long-term, I recommend reducing the dose of methotrexate to 7.5 mg once a week He will continue folic acid supplement

## 2020-05-21 NOTE — Progress Notes (Signed)
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 can be associated with autoimmune disorder He tolerated current dose prednisone well Currently, he takes prednisone 5 mg on Mondays, Wednesdays and Fridays and to take 10 mg for the rest of the week He has no recent flare of CIDP recently I recommend we continue the same  MGUS (monoclonal gammopathy of unknown significance) His MGUS panel is pending I will call his daughter with test results If MGUS is stable, I will check it once a year  LFT elevation This is due to methotrexate We will reduce the dose as above  Steroid-induced myopathy He has diffuse musculoskeletal pain and progression of steroid myopathy Unfortunately, there is nothing I could prescribe to help with that If he is interested, we can refer him back to physical therapy for rehab   No orders of the defined types were placed in this encounter.   All questions were answered. The patient knows to call the clinic with any problems, questions or concerns. The total time spent in the appointment was 25 minutes encounter with patients including review of chart and various tests results, discussions about plan of care and coordination of care plan   Heath Lark, MD 05/21/2020 12:03 PM  INTERVAL HISTORY: Please see below for problem oriented charting. He returns with his daughter for further follow-up He is doing well No  recent fall No recent infection He has progressive generalized weakness and have more difficulties getting up He is using a lot of upper body to push himself out of a chair and that is causing shoulder discomfort and neck discomfort  SUMMARY OF ONCOLOGIC HISTORY: Oncology History  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[place])  12/11/2015 Bone Marrow Biopsy   Bone marrow biopsy confirmed diagnosis of Tcell LGL. T-cell receptor rearrangement study is still pending   12/19/2015 -  Chemotherapy   The patient was started on 10 mg once a week methotrexate. The dose is increased to 12.5 mg starting 01/16/16      REVIEW OF SYSTEMS:   Constitutional: Denies fevers, chills or abnormal weight loss Eyes: Denies blurriness of vision Ears, nose, mouth, throat, and face: Denies mucositis or sore throat Respiratory: Denies cough, dyspnea or wheezes Cardiovascular: Denies palpitation, chest discomfort or lower extremity swelling Gastrointestinal:  Denies nausea, heartburn or change in bowel habits Skin: Denies abnormal skin rashes Lymphatics: Denies new lymphadenopathy or easy bruising Neurological:Denies numbness, tingling or new weaknesses Behavioral/Psych: Mood is stable, no new changes  All other systems were reviewed with the patient and are negative.  I have reviewed the past medical history, past surgical history, social history and family history with the patient and they are unchanged from previous note.  ALLERGIES:  is allergic to sulfa antibiotics and sulfonamide derivatives.  MEDICATIONS:  Current Outpatient Medications  Medication Sig Dispense Refill  . acetaminophen (TYLENOL) 325 MG tablet Take 650 mg by mouth every 6 (six) hours as needed.    Marland Kitchen aspirin EC 81 MG tablet Take 162 mg by mouth daily.    Marland Kitchen  CALCIUM CARB-CHOLECALCIFEROL PO Take 1,250 mg by mouth daily.    . Cholecalciferol (VITAMIN D3) 25 MCG (1000 UT) CAPS Take 2 capsules by mouth daily.    . Cyanocobalamin (VITAMIN B-12)  500 MCG SUBL Place 1 tablet under the tongue daily.     Marland Kitchen econazole nitrate 1 % cream Apply 1 application topically daily as needed.    . folic acid (FOLVITE) 601 MCG tablet Take 400 mcg by mouth daily.    Marland Kitchen gabapentin (NEURONTIN) 300 MG capsule TAKE 2 CAPSULES (600 MG TOTAL) BY MOUTH 2 (TWO) TIMES DAILY. 360 capsule 1  . methotrexate (RHEUMATREX) 2.5 MG tablet Take 3 tablets (7.5 mg total) by mouth once a week. Caution:Chemotherapy. Protect from light. 12 tablet 11  . Multiple Vitamin (MULTIVITAMIN) tablet Take 1 tablet by mouth daily.      . predniSONE (DELTASONE) 5 MG tablet TAKE 1 TABLET BY MOUTH ON MONDAYS, WEDS, AND FRIDAYS, AND TAKE 2 TABS OTHER DAYS OF THE WEEK 90 tablet 1  . timolol (TIMOPTIC) 0.5 % ophthalmic solution Place 1 drop into both eyes at bedtime. One drop in each eye at bedtime.    . triamcinolone (KENALOG) 0.025 % cream Apply 1 application topically as needed.     No current facility-administered medications for this visit.    PHYSICAL EXAMINATION: ECOG PERFORMANCE STATUS: 2 - Symptomatic, <50% confined to bed  Vitals:   05/21/20 0936  BP: 115/65  Pulse: 65  Resp: 18  Temp: (!) 97.5 F (36.4 C)  SpO2: 100%   Filed Weights   05/21/20 0936  Weight: 204 lb 9.6 oz (92.8 kg)    GENERAL:alert, no distress and comfortable Musculoskeletal:no cyanosis of digits and no clubbing.  Noted proximal muscle wasting NEURO: alert & oriented x 3 with fluent speech, no focal motor/sensory deficits  LABORATORY DATA:  I have reviewed the data as listed    Component Value Date/Time   NA 142 05/21/2020 0857   NA 138 12/15/2016 1319   K 3.9 05/21/2020 0857   K 4.1 12/15/2016 1319   CL 106 05/21/2020 0857   CO2 26 05/21/2020 0857   CO2 24 12/15/2016 1319   GLUCOSE 96 05/21/2020 0857   GLUCOSE 123 12/15/2016 1319   BUN 18 05/21/2020 0857   BUN 13.7 12/15/2016 1319   CREATININE 1.10 05/21/2020 0857   CREATININE 1.2 12/15/2016 1319   CALCIUM 8.9 05/21/2020 0857   CALCIUM  8.8 12/15/2016 1319   PROT 6.5 05/21/2020 0857   PROT 6.5 12/15/2016 1319   PROT 6.2 12/15/2016 1319   ALBUMIN 3.8 05/21/2020 0857   ALBUMIN 3.5 12/15/2016 1319   AST 39 05/21/2020 0857   AST 66 (H) 12/15/2016 1319   ALT 55 (H) 05/21/2020 0857   ALT 90 (H) 12/15/2016 1319   ALKPHOS 117 05/21/2020 0857   ALKPHOS 116 12/15/2016 1319   BILITOT 1.0 05/21/2020 0857   BILITOT 1.13 12/15/2016 1319   GFRNONAA >60 05/21/2020 0857   GFRAA >60 06/27/2019 0857    No results found for: SPEP, UPEP  Lab Results  Component Value Date   WBC 4.2 05/21/2020   NEUTROABS 0.9 (L) 05/21/2020   HGB 11.1 (L) 05/21/2020   HCT 32.8 (L) 05/21/2020   MCV 115.5 (H) 05/21/2020   PLT 237 05/21/2020      Chemistry      Component Value Date/Time   NA 142 05/21/2020 0857   NA 138 12/15/2016 1319   K 3.9 05/21/2020 0857   K 4.1 12/15/2016 1319  CL 106 05/21/2020 0857   CO2 26 05/21/2020 0857   CO2 24 12/15/2016 1319   BUN 18 05/21/2020 0857   BUN 13.7 12/15/2016 1319   CREATININE 1.10 05/21/2020 0857   CREATININE 1.2 12/15/2016 1319      Component Value Date/Time   CALCIUM 8.9 05/21/2020 0857   CALCIUM 8.8 12/15/2016 1319   ALKPHOS 117 05/21/2020 0857   ALKPHOS 116 12/15/2016 1319   AST 39 05/21/2020 0857   AST 66 (H) 12/15/2016 1319   ALT 55 (H) 05/21/2020 0857   ALT 90 (H) 12/15/2016 1319   BILITOT 1.0 05/21/2020 0857   BILITOT 1.13 12/15/2016 1319

## 2020-05-21 NOTE — Assessment & Plan Note (Signed)
He has diffuse musculoskeletal pain and progression of steroid myopathy Unfortunately, there is nothing I could prescribe to help with that If he is interested, we can refer him back to physical therapy for rehab

## 2020-05-21 NOTE — Assessment & Plan Note (Signed)
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

## 2020-05-21 NOTE — Assessment & Plan Note (Signed)
This is due to methotrexate We will reduce the dose as above

## 2020-05-22 LAB — KAPPA/LAMBDA LIGHT CHAINS
Kappa free light chain: 28.5 mg/L — ABNORMAL HIGH (ref 3.3–19.4)
Kappa, lambda light chain ratio: 4.32 — ABNORMAL HIGH (ref 0.26–1.65)
Lambda free light chains: 6.6 mg/L (ref 5.7–26.3)

## 2020-05-23 ENCOUNTER — Telehealth: Payer: Self-pay

## 2020-05-23 LAB — MULTIPLE MYELOMA PANEL, SERUM
Albumin SerPl Elph-Mcnc: 3.5 g/dL (ref 2.9–4.4)
Albumin/Glob SerPl: 1.3 (ref 0.7–1.7)
Alpha 1: 0.2 g/dL (ref 0.0–0.4)
Alpha2 Glob SerPl Elph-Mcnc: 0.5 g/dL (ref 0.4–1.0)
B-Globulin SerPl Elph-Mcnc: 0.6 g/dL — ABNORMAL LOW (ref 0.7–1.3)
Gamma Glob SerPl Elph-Mcnc: 1.5 g/dL (ref 0.4–1.8)
Globulin, Total: 2.8 g/dL (ref 2.2–3.9)
IgA: 27 mg/dL — ABNORMAL LOW (ref 61–437)
IgG (Immunoglobin G), Serum: 1693 mg/dL — ABNORMAL HIGH (ref 603–1613)
IgM (Immunoglobulin M), Srm: 13 mg/dL — ABNORMAL LOW (ref 15–143)
M Protein SerPl Elph-Mcnc: 1.4 g/dL — ABNORMAL HIGH
Total Protein ELP: 6.3 g/dL (ref 6.0–8.5)

## 2020-05-23 NOTE — Telephone Encounter (Signed)
Wife called and left a message regarding methotrexate Rx. Asking if Rx sent to [facility] pharmacy. Called back and per new instructions of 3 tabs once a week. Wait until he takes all of the 28 tabs, contact the pharmacy when he completes the current Rx and Dr. Alvy Bimler will fill the Rx. Wife verbalized understanding.

## 2020-05-27 ENCOUNTER — Telehealth: Payer: Self-pay

## 2020-05-27 NOTE — Telephone Encounter (Signed)
-----   Message from Heath Lark, MD sent at 05/27/2020 10:37 AM EDT ----- Pls call her daughter and let her know myeloma panel is stable, no change

## 2020-05-27 NOTE — Telephone Encounter (Signed)
Called and given below message to daughter. She verbalized understanding. 

## 2020-06-07 ENCOUNTER — Other Ambulatory Visit: Payer: Self-pay | Admitting: Hematology and Oncology

## 2020-08-28 ENCOUNTER — Other Ambulatory Visit: Payer: Self-pay | Admitting: Family Medicine

## 2020-10-30 ENCOUNTER — Other Ambulatory Visit: Payer: Self-pay | Admitting: Hematology and Oncology

## 2020-11-21 ENCOUNTER — Encounter: Payer: Self-pay | Admitting: Hematology and Oncology

## 2020-11-21 ENCOUNTER — Inpatient Hospital Stay: Payer: Medicare PPO

## 2020-11-21 ENCOUNTER — Other Ambulatory Visit: Payer: Self-pay

## 2020-11-21 ENCOUNTER — Inpatient Hospital Stay: Payer: Medicare PPO | Attending: Hematology and Oncology | Admitting: Hematology and Oncology

## 2020-11-21 DIAGNOSIS — Z7982 Long term (current) use of aspirin: Secondary | ICD-10-CM | POA: Insufficient documentation

## 2020-11-21 DIAGNOSIS — D61818 Other pancytopenia: Secondary | ICD-10-CM | POA: Diagnosis not present

## 2020-11-21 DIAGNOSIS — Z7952 Long term (current) use of systemic steroids: Secondary | ICD-10-CM | POA: Insufficient documentation

## 2020-11-21 DIAGNOSIS — C91Z0 Other lymphoid leukemia not having achieved remission: Secondary | ICD-10-CM | POA: Insufficient documentation

## 2020-11-21 DIAGNOSIS — G6181 Chronic inflammatory demyelinating polyneuritis: Secondary | ICD-10-CM | POA: Diagnosis not present

## 2020-11-21 DIAGNOSIS — D472 Monoclonal gammopathy: Secondary | ICD-10-CM | POA: Diagnosis not present

## 2020-11-21 DIAGNOSIS — R7989 Other specified abnormal findings of blood chemistry: Secondary | ICD-10-CM | POA: Diagnosis not present

## 2020-11-21 DIAGNOSIS — Z79899 Other long term (current) drug therapy: Secondary | ICD-10-CM | POA: Diagnosis not present

## 2020-11-21 DIAGNOSIS — D849 Immunodeficiency, unspecified: Secondary | ICD-10-CM

## 2020-11-21 LAB — COMPREHENSIVE METABOLIC PANEL
ALT: 57 U/L — ABNORMAL HIGH (ref 0–44)
AST: 39 U/L (ref 15–41)
Albumin: 3.6 g/dL (ref 3.5–5.0)
Alkaline Phosphatase: 112 U/L (ref 38–126)
Anion gap: 7 (ref 5–15)
BUN: 21 mg/dL (ref 8–23)
CO2: 25 mmol/L (ref 22–32)
Calcium: 8.9 mg/dL (ref 8.9–10.3)
Chloride: 109 mmol/L (ref 98–111)
Creatinine, Ser: 1.05 mg/dL (ref 0.61–1.24)
GFR, Estimated: >60 mL/min (ref >60)
Glucose, Bld: 97 mg/dL (ref 70–99)
Potassium: 4.2 mmol/L (ref 3.5–5.1)
Sodium: 141 mmol/L (ref 135–145)
Total Bilirubin: 1.2 mg/dL (ref 0.3–1.2)
Total Protein: 6.3 g/dL — ABNORMAL LOW (ref 6.5–8.1)

## 2020-11-21 LAB — CBC WITH DIFFERENTIAL/PLATELET
Abs Immature Granulocytes: 0.01 10*3/uL (ref 0.00–0.07)
Basophils Absolute: 0 10*3/uL (ref 0.0–0.1)
Basophils Relative: 1 %
Eosinophils Absolute: 0.2 10*3/uL (ref 0.0–0.5)
Eosinophils Relative: 3 %
HCT: 29.3 % — ABNORMAL LOW (ref 39.0–52.0)
Hemoglobin: 10.2 g/dL — ABNORMAL LOW (ref 13.0–17.0)
Immature Granulocytes: 0 %
Lymphocytes Relative: 72 %
Lymphs Abs: 3.6 10*3/uL (ref 0.7–4.0)
MCH: 40.2 pg — ABNORMAL HIGH (ref 26.0–34.0)
MCHC: 34.8 g/dL (ref 30.0–36.0)
MCV: 115.4 fL — ABNORMAL HIGH (ref 80.0–100.0)
Monocytes Absolute: 0.3 10*3/uL (ref 0.1–1.0)
Monocytes Relative: 7 %
Neutro Abs: 0.8 10*3/uL — ABNORMAL LOW (ref 1.7–7.7)
Neutrophils Relative %: 17 %
Platelets: 216 10*3/uL (ref 150–400)
RBC: 2.54 MIL/uL — ABNORMAL LOW (ref 4.22–5.81)
RDW: 17 % — ABNORMAL HIGH (ref 11.5–15.5)
WBC: 4.9 10*3/uL (ref 4.0–10.5)
nRBC: 0 % (ref 0.0–0.2)

## 2020-11-21 NOTE — Progress Notes (Signed)
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[place]) I noticed that the patient has progressive neutropenia with treatment His LFTs is intermittently elevated Given stability of his LGL control with methotrexate long-term, I recommend we continue at the dose of methotrexate to 7.5 mg once a week He will continue folic acid supplement  MGUS (monoclonal gammopathy of unknown significance) His last MGUS panel was stable I plan to recheck it next year  LFT elevation This is due to methotrexate We will reduce the dose as above  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 can be associated with autoimmune disorder He tolerated current dose prednisone well Currently, he takes prednisone 5 mg on Mondays, Wednesdays and Fridays and to take 10 mg for the rest of the week He has no recent flare of CIDP recently I recommend we continue the same  Immunocompromised patient [facility]) He is immunocompromised due to his medical condition He is also pancytopenic We discussed neutropenic precaution We discussed the importance of influenza vaccination and updated pneumococcal vaccination   Orders Placed This Encounter  Procedures   Kappa/lambda light chains    Standing Status:   Future    Standing Expiration Date:   11/21/2021   Multiple Myeloma Panel (SPEP&IFE w/QIG)    Standing Status:   Future    Standing Expiration Date:   11/21/2021    All questions were answered. The patient knows to call the clinic with any problems, questions or concerns. The total time spent in the appointment was 30 minutes encounter with patients including review of chart and various tests results, discussions about plan of care and coordination of care plan   Heath Lark, MD 11/21/2020 4:24  PM  INTERVAL HISTORY: Please see below for problem oriented charting. he returns for treatment follow-up on chronic methotrexate and prednisone for LGL and CIDP The patient also have history of MGUS on observation He is here accompanied by his daughter Overall, the patient have slow general decline in health Both the patient and his wife are getting more dependent on caregivers for activities of daily living He denies recent infection Appetite is fair No worsening peripheral neuropathy  REVIEW OF SYSTEMS:   Constitutional: Denies fevers, chills or abnormal weight loss Eyes: Denies blurriness of vision Ears, nose, mouth, throat, and face: Denies mucositis or sore throat Respiratory: Denies cough, dyspnea or wheezes Cardiovascular: Denies palpitation, chest discomfort or lower extremity swelling Gastrointestinal:  Denies nausea, heartburn or change in bowel habits Skin: Denies abnormal skin rashes Lymphatics: Denies new lymphadenopathy or easy bruising Behavioral/Psych: Mood is stable, no new changes  All other systems were reviewed with the patient and are negative.  I have reviewed the past medical history, past surgical history, social history and family history with the patient and they are unchanged from previous note.  ALLERGIES:  is allergic to sulfa antibiotics and sulfonamide derivatives.  MEDICATIONS:  Current Outpatient Medications  Medication Sig Dispense Refill   acetaminophen (TYLENOL) 325 MG tablet Take 650 mg by mouth every 6 (six) hours as needed.     aspirin EC 81 MG tablet Take 162 mg by mouth daily.     CALCIUM CARB-CHOLECALCIFEROL PO Take 1,250 mg by mouth daily.     Cholecalciferol (VITAMIN D3) 25 MCG (1000 UT) CAPS Take 2 capsules by mouth daily.     Cyanocobalamin (  VITAMIN B-12) 500 MCG SUBL Place 1 tablet under the tongue daily.      econazole nitrate 1 % cream Apply 1 application topically daily as needed.     folic acid (FOLVITE) 264 MCG tablet Take 400  mcg by mouth daily.     gabapentin (NEURONTIN) 300 MG capsule TAKE 2 CAPSULES BY MOUTH 2 TIMES DAILY. 360 capsule 0   methotrexate (RHEUMATREX) 2.5 MG tablet Take 3 tablets (7.5 mg total) by mouth once a week. Caution:Chemotherapy. Protect from light. 12 tablet 11   Multiple Vitamin (MULTIVITAMIN) tablet Take 1 tablet by mouth daily.       predniSONE (DELTASONE) 5 MG tablet TAKE 1 TABLET BY MOUTH ON MONDAYS, WEDS, AND FRIDAYS, AND TAKE 2 TABS OTHER DAYS OF THE WEEK 90 tablet 1   timolol (TIMOPTIC) 0.5 % ophthalmic solution Place 1 drop into both eyes at bedtime. One drop in each eye at bedtime.     triamcinolone (KENALOG) 0.025 % cream Apply 1 application topically as needed.     No current facility-administered medications for this visit.    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 T-cell receptor rearrangement study is still pending   12/19/2015 -  Chemotherapy   The patient was started on 10 mg once a week methotrexate. The dose is increased to 12.5 mg starting 01/16/16      PHYSICAL EXAMINATION: ECOG PERFORMANCE STATUS: 2 - Symptomatic, <50% confined to bed  Vitals:   11/21/20 1014  BP: (!) 119/51  Pulse: 63  Resp: 18  Temp: 97.7 F (36.5 C)  SpO2: 100%   Filed Weights   11/21/20 1014  Weight: 195 lb 3.2 oz (88.5 kg)    GENERAL:alert, no distress and comfortable SKIN: skin color, texture, turgor are normal, no rashes or significant lesions EYES: normal, Conjunctiva are pink and non-injected, sclera clear OROPHARYNX:no exudate, no erythema and lips, buccal mucosa, and tongue normal  NECK: supple, thyroid normal size, non-tender, without nodularity LYMPH:  no palpable lymphadenopathy in the cervical, axillary or inguinal LUNGS: clear to auscultation and percussion with normal breathing effort HEART: regular rate & rhythm and no murmurs  and no lower extremity edema ABDOMEN:abdomen soft, non-tender and normal bowel sounds Musculoskeletal:no cyanosis of digits and no clubbing  NEURO: alert & oriented x 3 with fluent speech, no focal motor/sensory deficits  LABORATORY DATA:  I have reviewed the data as listed    Component Value Date/Time   NA 141 11/21/2020 0915   NA 138 12/15/2016 1319   K 4.2 11/21/2020 0915   K 4.1 12/15/2016 1319   CL 109 11/21/2020 0915   CO2 25 11/21/2020 0915   CO2 24 12/15/2016 1319   GLUCOSE 97 11/21/2020 0915   GLUCOSE 123 12/15/2016 1319   BUN 21 11/21/2020 0915   BUN 13.7 12/15/2016 1319   CREATININE 1.05 11/21/2020 0915   CREATININE 1.2 12/15/2016 1319   CALCIUM 8.9 11/21/2020 0915   CALCIUM 8.8 12/15/2016 1319   PROT 6.3 (L) 11/21/2020 0915   PROT 6.5 12/15/2016 1319   PROT 6.2 12/15/2016 1319   ALBUMIN 3.6 11/21/2020 0915   ALBUMIN 3.5 12/15/2016 1319   AST 39 11/21/2020 0915   AST 66 (H) 12/15/2016 1319   ALT 57 (H) 11/21/2020 0915   ALT 90 (H) 12/15/2016 1319   ALKPHOS 112 11/21/2020 0915   ALKPHOS 116 12/15/2016 1319   BILITOT  1.2 11/21/2020 0915   BILITOT 1.13 12/15/2016 1319   GFRNONAA >60 11/21/2020 0915   GFRAA >60 06/27/2019 0857    No results found for: SPEP, UPEP  Lab Results  Component Value Date   WBC 4.9 11/21/2020   NEUTROABS 0.8 (L) 11/21/2020   HGB 10.2 (L) 11/21/2020   HCT 29.3 (L) 11/21/2020   MCV 115.4 (H) 11/21/2020   PLT 216 11/21/2020      Chemistry      Component Value Date/Time   NA 141 11/21/2020 0915   NA 138 12/15/2016 1319   K 4.2 11/21/2020 0915   K 4.1 12/15/2016 1319   CL 109 11/21/2020 0915   CO2 25 11/21/2020 0915   CO2 24 12/15/2016 1319   BUN 21 11/21/2020 0915   BUN 13.7 12/15/2016 1319   CREATININE 1.05 11/21/2020 0915   CREATININE 1.2 12/15/2016 1319      Component Value Date/Time   CALCIUM 8.9 11/21/2020 0915   CALCIUM 8.8 12/15/2016 1319   ALKPHOS 112 11/21/2020 0915   ALKPHOS 116 12/15/2016 1319   AST 39  11/21/2020 0915   AST 66 (H) 12/15/2016 1319   ALT 57 (H) 11/21/2020 0915   ALT 90 (H) 12/15/2016 1319   BILITOT 1.2 11/21/2020 0915   BILITOT 1.13 12/15/2016 1319

## 2020-11-21 NOTE — Assessment & Plan Note (Signed)
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

## 2020-11-21 NOTE — Assessment & Plan Note (Signed)
I noticed that the patient has progressive neutropenia with treatment His LFTs is intermittently elevated Given stability of his LGL control with methotrexate long-term, I recommend we continue at the dose of methotrexate to 7.5 mg once a week He will continue folic acid supplement

## 2020-11-21 NOTE — Assessment & Plan Note (Signed)
This is due to methotrexate We will reduce the dose as above

## 2020-11-21 NOTE — Assessment & Plan Note (Signed)
He is immunocompromised due to his medical condition He is also pancytopenic We discussed neutropenic precaution We discussed the importance of influenza vaccination and updated pneumococcal vaccination

## 2020-11-21 NOTE — Assessment & Plan Note (Signed)
His last MGUS panel was stable I plan to recheck it next year 

## 2020-11-22 ENCOUNTER — Other Ambulatory Visit: Payer: Self-pay | Admitting: Family Medicine

## 2020-11-22 NOTE — Telephone Encounter (Signed)
Rx sent.  Please see when he can f/u in clinic or have a VV.  Thanks.

## 2020-11-22 NOTE — Telephone Encounter (Signed)
Refill request for Gabapentin 300 mg caps  LOV - 12/22/18 Next OV - not scheduled Last refill - 08/29/20 #360/0

## 2020-11-26 ENCOUNTER — Encounter: Payer: Self-pay | Admitting: Family Medicine

## 2020-11-26 DIAGNOSIS — B354 Tinea corporis: Secondary | ICD-10-CM | POA: Diagnosis not present

## 2020-11-26 DIAGNOSIS — D2272 Melanocytic nevi of left lower limb, including hip: Secondary | ICD-10-CM | POA: Diagnosis not present

## 2020-11-26 DIAGNOSIS — Z85828 Personal history of other malignant neoplasm of skin: Secondary | ICD-10-CM | POA: Diagnosis not present

## 2020-11-26 DIAGNOSIS — L570 Actinic keratosis: Secondary | ICD-10-CM | POA: Diagnosis not present

## 2020-11-26 DIAGNOSIS — X32XXXA Exposure to sunlight, initial encounter: Secondary | ICD-10-CM | POA: Diagnosis not present

## 2020-11-26 DIAGNOSIS — D2262 Melanocytic nevi of left upper limb, including shoulder: Secondary | ICD-10-CM | POA: Diagnosis not present

## 2020-11-26 DIAGNOSIS — D2261 Melanocytic nevi of right upper limb, including shoulder: Secondary | ICD-10-CM | POA: Diagnosis not present

## 2020-11-26 NOTE — Telephone Encounter (Signed)
Called patient and left message and sent mychart letter

## 2020-11-26 NOTE — Telephone Encounter (Signed)
Patient scheduled appt for 12/24/20

## 2020-12-24 ENCOUNTER — Encounter: Payer: Self-pay | Admitting: Family Medicine

## 2020-12-24 ENCOUNTER — Other Ambulatory Visit: Payer: Self-pay

## 2020-12-24 ENCOUNTER — Ambulatory Visit (INDEPENDENT_AMBULATORY_CARE_PROVIDER_SITE_OTHER): Payer: Medicare PPO | Admitting: Family Medicine

## 2020-12-24 VITALS — BP 124/80 | HR 82 | Temp 99.1°F | Ht 76.0 in | Wt 200.0 lb

## 2020-12-24 DIAGNOSIS — Z0000 Encounter for general adult medical examination without abnormal findings: Secondary | ICD-10-CM | POA: Diagnosis not present

## 2020-12-24 DIAGNOSIS — R399 Unspecified symptoms and signs involving the genitourinary system: Secondary | ICD-10-CM | POA: Diagnosis not present

## 2020-12-24 DIAGNOSIS — Z23 Encounter for immunization: Secondary | ICD-10-CM | POA: Diagnosis not present

## 2020-12-24 DIAGNOSIS — M25519 Pain in unspecified shoulder: Secondary | ICD-10-CM

## 2020-12-24 DIAGNOSIS — Z7189 Other specified counseling: Secondary | ICD-10-CM

## 2020-12-24 MED ORDER — FINASTERIDE 5 MG PO TABS: 5.0000 mg | ORAL_TABLET | Freq: Every day | ORAL | Status: DC

## 2020-12-24 MED ORDER — FINASTERIDE 5 MG PO TABS: 5.0000 mg | ORAL_TABLET | Freq: Every day | ORAL | 3 refills | Status: DC

## 2020-12-24 MED ORDER — GABAPENTIN 300 MG PO CAPS: 600.0000 mg | ORAL_CAPSULE | Freq: Two times a day (BID) | ORAL | 3 refills | Status: DC

## 2020-12-24 NOTE — Progress Notes (Signed)
This visit occurred during the SARS-CoV-2 public health emergency.  Safety protocols were in place, including screening questions prior to the visit, additional usage of staff PPE, and extensive cleaning of exam room while observing appropriate contact time as indicated for disinfecting solutions.  I have personally reviewed the Medicare Annual Wellness questionnaire and have noted 1. The patient's medical and social history 2. Their use of alcohol, tobacco or illicit drugs 3. Their current medications and supplements 4. The patient's functional ability including ADL's, fall risks, home safety risks and hearing or visual             impairment. 5. Diet and physical activities 6. Evidence for depression or mood disorders  The patients weight, height, BMI have been recorded in the chart and visual acuity is per eye clinic.  I have made referrals, counseling and provided education to the patient based review of the above and I have provided the pt with a written personalized care plan for preventive services.  Provider list updated- see scanned forms.  Routine anticipatory guidance given to patient.  See health maintenance. The possibility exists that previously documented standard health maintenance information may have been brought forward from a previous encounter into this note.  If needed, that same information has been updated to reflect the current situation based on today's encounter.    Flu 2022 Shingles discussed with patient.  I want him to check with hematology before he gets this done. PNA 23 updated 2022 Tetanus 2020 COVID-vaccine previously done Colon cancer screening deferred given his age.  He agrees. Prostate cancer screening deferred given his age.  He agrees. Advance directive discussed with patient.  Wife and kids are equally designated if patient were incapacitated. Cognitive function addressed- see scanned forms- and if abnormal then additional documentation follows.    In addition to [facility] Wellness, follow up visit for the below conditions:  He has LUTS with frequency.  He can hold rx for finasteride for now.  D/w pt.  Sulfa allergy.  We talked about deferring PSA given his overall situation.    He has some shoulder pain from pushing up from the chair, due to leg weakness.  He has been icing as needed.  Gabapentin helps the pain and he has tapered to current dose.  No ADE on med.  It helps some.  Walking with walker at baseline.  He has gone to PT prior.  He is trying to maintain as much conditioning as possible.  He has limited overhead ROM B (more restriction on the R shoulder) but intact int and ext rotation.    PMH and SH reviewed  Meds, vitals, and allergies reviewed.   ROS: Per HPI.  Unless specifically indicated otherwise in HPI, the patient denies:  General: fever. Eyes: acute vision changes ENT: sore throat Cardiovascular: chest pain Respiratory: SOB GI: vomiting GU: dysuria Musculoskeletal: acute back pain Derm: acute rash Neuro: acute motor dysfunction Psych: worsening mood Endocrine: polydipsia Heme: bleeding Allergy: hayfever  GEN: nad, alert and oriented HEENT: ncat NECK: supple w/o LA CV: rrr. PULM: ctab, no inc wob ABD: soft, +bs EXT: 1-2+ edema SKIN: chronic BLE changes from edema.  Bilateral shoulder pain with overhead movement.  He has restriction especially in the right shoulder for full abduction.

## 2020-12-24 NOTE — Patient Instructions (Addendum)
Start finasteride if needed for urinary symptoms.   Take care.  Glad to see you. Pneumonia shot today.  Let me know if you want to see ortho or get an xray.

## 2020-12-29 DIAGNOSIS — R399 Unspecified symptoms and signs involving the genitourinary system: Secondary | ICD-10-CM | POA: Insufficient documentation

## 2020-12-29 DIAGNOSIS — M25519 Pain in unspecified shoulder: Secondary | ICD-10-CM | POA: Insufficient documentation

## 2020-12-29 NOTE — Assessment & Plan Note (Signed)
Advance directive discussed with patient.  Wife and kids are equally designated if patient were incapacitated.

## 2020-12-29 NOTE — Assessment & Plan Note (Signed)
Discussed options.  He can let me know if he wants [place] to x-ray shoulders or set him up with orthopedics.  Reasonable to continue gabapentin in the meantime.  This is exacerbated by his overall level of deconditioning but he is trying to be as active as possible and try to maintain as much muscle mass as he can.  He still going to follow-up with hematology at baseline and he will continue prednisone for now.

## 2020-12-29 NOTE — Assessment & Plan Note (Signed)
He has LUTS with frequency.  Discussed options.  He can hold rx for finasteride for now.  D/w pt. he can start it if needed.  We talked about the fact that it does not have sudden onset for symptom improvement.  Routine cautions given.  Sulfa allergy precludes Flomax use.  We talked about deferring PSA given his overall situation.

## 2020-12-29 NOTE — Assessment & Plan Note (Signed)
Flu 2022 Shingles discussed with patient.  I want him to check with hematology before he gets this done. PNA 23 updated 2022 Tetanus 2020 COVID-vaccine previously done Colon cancer screening deferred given his age.  He agrees. Prostate cancer screening deferred given his age.  He agrees. Advance directive discussed with patient.  Wife and kids are equally designated if patient were incapacitated. Cognitive function addressed- see scanned forms- and if abnormal then additional documentation follows.

## 2021-01-03 ENCOUNTER — Telehealth: Payer: Self-pay

## 2021-01-03 NOTE — Telephone Encounter (Signed)
Called daughter and LVM advising per MD OK to proceed with COVID booster.

## 2021-01-03 NOTE — Telephone Encounter (Signed)
He can proceed, I do not have strong opinion to recommend it for patients who have received at least 1 booster inj

## 2021-01-03 NOTE — Telephone Encounter (Signed)
Pt's daughter, Manuela Schwartz called and states pt has had flu shot and PNA vaccine, asks if it is OK to proceed with most recent COVID booster as MD mentioned she may not want pt to have it. Message will be routed to MD for recommendation. Pt's daughter aware.

## 2021-01-10 ENCOUNTER — Telehealth: Payer: Self-pay

## 2021-01-10 NOTE — Telephone Encounter (Signed)
Returned [name] call. She is asking if it okay for her Dad to get the covid booster. Per previous call/ message, okay to get the covid booster. [name] verbalized understanding.

## 2021-01-21 ENCOUNTER — Telehealth: Payer: Self-pay

## 2021-01-21 DIAGNOSIS — R059 Cough, unspecified: Secondary | ICD-10-CM | POA: Diagnosis not present

## 2021-01-21 DIAGNOSIS — J101 Influenza due to other identified influenza virus with other respiratory manifestations: Secondary | ICD-10-CM | POA: Diagnosis not present

## 2021-01-21 NOTE — Telephone Encounter (Signed)
[name] - Client TELEPHONE ADVICE RECORD [facility] Patient Name: [name] Gender: Male DOB: Jun 26, 1940 Age: 80 Y 22 M 1 D Return Phone Number: [phone] (Primary), [phone] (Secondary) Address: City/ State/ Zip: [place]  [address] Client [name] - Client Client Site [place] - [facility] Provider [name] - MD Contact Type Call Who Is Calling Patient / Member / Family / Caregiver Call Type Triage / Clinical Caller Name [name] Relationship To Patient Spouse Return Phone Number [phone] (Primary) Chief Complaint CHEST PAIN - pain, pressure, heaviness or tightness Reason for Call Symptomatic / Request for [place] states she has a pt on the line and need home care advice. Caller states they have stuffy nose and sore throat and unable to get out of bed. Caller states his lungs is rattling and hurting and coughing up stuff. Translation No Nurse Assessment Nurse: Michael Reaper, RN, [name] Date/Time (Eastern Time): 01/21/2021 1:02:25 PM Confirm and document reason for call. If symptomatic, describe symptoms. ---Caller states her husband is coughing up yellow mucus chest is rattling, horse voice, sore throat, symptoms started overnight and they are getting worse, is able to use walker and go to bathroom, is drinking and voiding, Current Temp -100 orally, denies other symptoms, Does the patient have any new or worsening symptoms? ---Yes Will a triage be completed? ---Yes Related visit to physician within the last 2 weeks? ---No Does the PT have any chronic conditions? (i.e. diabetes, asthma, this includes High risk factors for pregnancy, etc.) ---No Is this a behavioral health or substance abuse call? ---No Guidelines Guideline Title Affirmed Question Affirmed Notes Nurse Date/Time (Eastern Time) Influenza - Seasonal Patient is  HIGH RISK (e.g., age > 46 years, pregnant, HIV +, or chronic medical condition) Michael Reaper, RN, [name] 01/21/2021 1:06:04 PM PLEASE NOTE: All timestamps contained within this report are represented as [place] Standard Time. CONFIDENTIALTY NOTICE: This fax transmission is intended only for the addressee. It contains information that is legally privileged, confidential or otherwise protected from use or disclosure. If you are not the intended recipient, you are strictly prohibited from reviewing, disclosing, copying using or disseminating any of this information or taking any action in reliance on or regarding this information. If you have received this fax in error, please notify us immediately by telephone so that we can arrange for its return to [place]. Phone: [phone], Toll-Free: [phone], Fax: [phone] Page: 2 of 2 Call Id: [id number] [place]. Time [name] Time) Disposition Final User 01/21/2021 1:01:16 PM Send to Urgent Queue [name] 01/21/2021 1:16:02 PM See HCP within 4 Hours (or PCP triage) Yes Michael Reaper, RN, [name] Disposition Overriden: Call PCP within 24 Hours Override Reason: Patients symptoms need a higher level of care Caller Disagree/Comply Comply Caller Understands Yes PreDisposition [place] Advice Given Per Guideline SEE HCP (OR PCP TRIAGE) WITHIN 4 HOURS: * IF OFFICE WILL BE OPEN: You need to be seen within the next 3 or 4 hours. Call your doctor (or NP/PA) now or as soon as the office opens. PAIN AND FEVER MEDICINES: * For pain or fever relief, take either acetaminophen or ibuprofen. Comments User: Michael Sis, RN Date/Time [name] Time): 01/21/2021 1:19:08 PM Transferred to [name] for vv today and pt declined and has to talk speak with her daughter, instructed if she is unable to do VV he will need to be seen at [place] PCP OFFIC

## 2021-01-21 NOTE — Telephone Encounter (Signed)
I spoke with pt wife (DPR signed); pts wife said that pt temp is 101 and pt has taken tylenol. Pt has raspy breathing and some SOB. Prod cough with yellow phlegm. Pt had diarrhea on 01/20/21. No other symptoms at this time.Pts wife said she thinks her daughter is coming to take pt to [facility]. ED precautions given and pts wife voiced understanding. I spoke with Manuela Schwartz pts daughter (DPR signed) and she is leaving work now to go to pt and access what is going on with pt. Manuela Schwartz said she is not sure where to take pt that he will not have to wait a long time and will not be exposed to a lot of germs. I advised Manuela Schwartz if pt condition is where pt is having problems moving around and getting out of bed and SOB and difficulty breathing and if Manuela Schwartz is not able to get pt into her vehicle she may want to call 911. Manuela Schwartz said she will probably either go to [facility] or [facility] ED. Advised ED precautions to Manuela Schwartz and she voiced understanding. Manuela Schwartz will call our office back if needed. Sending note to Dr Damita Dunnings and Janett Billow CMA and I will teams [facility] CMA.

## 2021-01-21 NOTE — Telephone Encounter (Signed)
Noted.  Thanks.  Will await f/u note.  Please check on patient on Wednesday.

## 2021-01-22 NOTE — Telephone Encounter (Signed)
Left message to return call to our office.  

## 2021-01-23 NOTE — Telephone Encounter (Signed)
LMTCB

## 2021-01-28 MED ORDER — BENZONATATE 200 MG PO CAPS: 200.0000 mg | ORAL_CAPSULE | Freq: Three times a day (TID) | ORAL | 1 refills | Status: DC | PRN

## 2021-01-28 MED ORDER — HYDROCODONE BIT-HOMATROP MBR 5-1.5 MG/5ML PO SOLN: 5.0000 mL | Freq: Three times a day (TID) | ORAL | 0 refills | Status: DC | PRN

## 2021-01-28 NOTE — Addendum Note (Signed)
Addended by: Tonia Ghent on: 01/28/2021 03:48 PM   Modules accepted: Orders

## 2021-01-28 NOTE — Telephone Encounter (Signed)
Patient still having a lot of cough, congestion and hoarseness. Patient just finished all the meds that fast med gave him and he is still feeling awful.

## 2021-01-28 NOTE — Telephone Encounter (Addendum)
I saw this flu positive result with rx for tamiflu.  It wouldn't make sense to extend that.  If worse in the meantime, then needs to get rechecked.  I would expect the hoarseness to gradually improve.  He can try tessalon for the cough first.  If that doesn't work then he can try hycodan, sedation caution.  Rx sent for both.  Try to keep drinking fluids in the meantime.  Thanks.

## 2021-01-28 NOTE — Telephone Encounter (Signed)
Spoke with patient and notified him that rxs were sent in. Advised to try tessalon perles first and if that does not help can take the hycodan syrup.

## 2021-01-29 ENCOUNTER — Telehealth: Payer: Self-pay | Admitting: Family Medicine

## 2021-01-29 NOTE — Telephone Encounter (Signed)
Pt daughter called wanting to discuss the medication that was prescribed and wanted to know if he can still take mucinex and tylenol with the cough meds  Manuela Schwartz [phone]

## 2021-01-29 NOTE — Telephone Encounter (Signed)
Patient's daughter Manuela Schwartz notified as instructed by telephone and verbalized understanding.

## 2021-01-29 NOTE — Telephone Encounter (Signed)
Thanks. Agreed.

## 2021-01-29 NOTE — Telephone Encounter (Signed)
Error

## 2021-01-29 NOTE — Telephone Encounter (Addendum)
He was prescribed tessalon perls and hycodan cough syrup.  He may take tessalon perls with tylenol and plain mucinex.  He may take hycodan with tylenol but wouldn't take at same time as plain mucinex - still ok to take on same day, just separately. Would suggest mucinex during day with large glass of water, hycodan cough syrup at night time to help sleep.

## 2021-02-18 ENCOUNTER — Other Ambulatory Visit: Payer: Self-pay | Admitting: Hematology and Oncology

## 2021-02-27 ENCOUNTER — Ambulatory Visit: Payer: Medicare PPO | Admitting: Family Medicine

## 2021-02-27 ENCOUNTER — Other Ambulatory Visit: Payer: Self-pay

## 2021-02-27 ENCOUNTER — Encounter: Payer: Medicare PPO | Attending: Physician Assistant | Admitting: Physician Assistant

## 2021-02-27 DIAGNOSIS — Z856 Personal history of leukemia: Secondary | ICD-10-CM | POA: Diagnosis not present

## 2021-02-27 DIAGNOSIS — G9009 Other idiopathic peripheral autonomic neuropathy: Secondary | ICD-10-CM | POA: Diagnosis not present

## 2021-02-27 DIAGNOSIS — L97422 Non-pressure chronic ulcer of left heel and midfoot with fat layer exposed: Secondary | ICD-10-CM | POA: Insufficient documentation

## 2021-02-27 NOTE — Progress Notes (Addendum)
Michael, Morgan ([id number]) Visit Report for 02/27/2021 Allergy List Details Patient Name: Michael, Morgan. Date of Service: 02/27/2021 12:45 PM Medical Record Number: [id number] Patient Account Number: [id number] Date of Birth/Sex: 04-18-1940 (81 y.o. M) Treating RN: [name] Primary Care [name]: [name] Other Clinician: Referring [name]: [name] Michael [name]/Extender: [name] in Treatment: 0 Allergies Active Allergies Sulfa (Sulfonamide Antibiotics) Allergy Notes Electronic Signature(s) Signed: 02/27/2021 5:13:08 PM By: [name], BSN, RN, CWS, Kim RN, BSN Entered By: [name], BSN, RN, CWS, Kim on 02/27/2021 13:08:18 [name] ([id number]) -------------------------------------------------------------------------------- Arrival Information Details Patient Name: Michael, Morgan. Date of Service: 02/27/2021 12:45 PM Medical Record Number: [id number] Patient Account Number: [id number] Date of Birth/Sex: [birth year] (81 y.o. M) Treating RN: [name] Primary Care [name]: [name] Other Clinician: Referring [name]: [name] Michael [name]/Extender: [name] in Treatment: 0 Visit Information Patient Arrived: Wheel Chair Arrival Time: 13:00 Accompanied By: [name] POA Transfer Assistance: None Patient Identification Verified: Yes Secondary Verification Process Completed: Yes Patient Has Alerts: Yes Patient Alerts: Patient on Blood Thinner 81MG  x 2 NOT DIABETIC Electronic Signature(s) Signed: 02/27/2021 5:13:08 PM By: [name], BSN, RN, CWS, Kim RN, BSN Entered By: [name], BSN, RN, CWS, Kim on 02/27/2021 13:06:40 [name] ([id number]) -------------------------------------------------------------------------------- Clinic Level of Care Assessment Details Patient Name: Michael, Morgan. Date of Service: 02/27/2021 12:45 PM Medical Record Number: [id number] Patient Account Number: [id number] Date of Birth/Sex:  [birth year] (81 y.o. M) Treating RN: [name] Primary Care Akia Montalban: [name] Other Clinician: Referring [name]: [name] Michael [name]/Extender: [name] in Treatment: 0 Clinic Level of Care Assessment Items TOOL 1 Quantity Score []  - Use when EandM and Procedure is performed on INITIAL visit 0 ASSESSMENTS - Nursing Assessment / Reassessment X - General Physical Exam (combine w/ comprehensive assessment (listed just below) when performed on new 1 20 pt. evals) X- 1 25 Comprehensive Assessment (HX, ROS, Risk Assessments, Wounds Hx, etc.) ASSESSMENTS - Wound and Skin Assessment / Reassessment []  - Dermatologic / Skin Assessment (not related to wound area) 0 ASSESSMENTS - Ostomy and/or Continence Assessment and Care []  - Incontinence Assessment and Management 0 []  - 0 Ostomy Care Assessment and Management (repouching, etc.) PROCESS - Coordination of Care X - Simple Patient / Family Education for ongoing care 1 15 []  - 0 Complex (extensive) Patient / Family Education for ongoing care X- 1 10 Staff obtains Consents, Records, Test Results / Process Orders []  - 0 Staff telephones HHA, Nursing Homes / Clarify orders / etc []  - 0 Routine Transfer to another Facility (non-emergent condition) []  - 0 Routine Hospital Admission (non-emergent condition) X- 1 15 New Admissions / Biomedical engineer / Ordering NPWT, Apligraf, etc. []  - 0 Emergency Hospital Admission (emergent condition) PROCESS - Special Needs []  - Pediatric / Minor Patient Management 0 []  - 0 Isolation Patient Management []  - 0 Hearing / Language / Visual special needs []  - 0 Assessment of Community assistance (transportation, D/C planning, etc.) []  - 0 Additional assistance / Altered mentation []  - 0 Support Surface(s) Assessment (bed, cushion, seat, etc.) INTERVENTIONS - Miscellaneous []  - External ear exam 0 []  - 0 Patient Transfer (multiple staff / Civil Service fast streamer / Similar  devices) []  - 0 Simple Staple / Suture removal (25 or less) []  - 0 Complex Staple / Suture removal (26 or more) []  - 0 Hypo/Hyperglycemic Management (do not check if billed separately) X- 1 15 Ankle / Brachial Index (ABI) - do not check if  billed separately Has the patient been seen at the hospital within the last three years: Yes Total Score: 100 Level Of Care: New/Established - [address] [name] ([id number]) Electronic Signature(s) Signed: 02/27/2021 5:13:08 PM By: [name], BSN, RN, CWS, Kim RN, BSN Entered By: [name], BSN, RN, CWS, Kim on 02/27/2021 14:41:19 [name] ([id number]) -------------------------------------------------------------------------------- Encounter Discharge Information Details Patient Name: Michael, Morgan. Date of Service: 02/27/2021 12:45 PM Medical Record Number: [id number] Patient Account Number: [id number] Date of Birth/Sex: 02-29-1940 (81 y.o. M) Treating RN: [name] Primary Care Adyen Bifulco: [name] Other Clinician: Referring [name]: [name] Michael [name]: [name] in Treatment: 0 Encounter Discharge Information Items Post Procedure Vitals Discharge Condition: Stable Unable to obtain vitals Reason: limited time Ambulatory Status: Ambulatory Discharge Destination: Home Transportation: Private Auto Accompanied By: self Schedule Follow-up Appointment: No Clinical Summary of Care: Electronic Signature(s) Signed: 02/27/2021 5:13:08 PM By: [name], BSN, RN, CWS, Kim RN, BSN Entered By: [name], BSN, RN, CWS, Kim on 02/27/2021 14:42:47 [name] ([id number]) -------------------------------------------------------------------------------- Lower Extremity Assessment Details Patient Name: Michael, Morgan. Date of Service: 02/27/2021 12:45 PM Medical Record Number: [id number] Patient Account Number: [id number] Date of Birth/Sex: [birth year] (81 y.o. M) Treating RN: [name] Primary Care Natesha Hassey:  [name] Other Clinician: Referring [name]: [name] [name] in Treatment: 0 Edema Assessment Assessed: Shirlyn Goltz: Yes] [Right: No] Edema: [Left: Ye] [Right: s] Calf Left: Right: Point of Measurement: 36 cm From Medial Instep 39 cm Ankle Left: Right: Point of Measurement: 15 cm From Medial Instep 26.4 cm Knee To Floor Left: Right: From Medial Instep 52 cm Vascular Assessment Pulses: Dorsalis Pedis Palpable: [Left:Yes] Doppler Audible: [Left:Yes] Posterior Tibial Palpable: [Left:Yes] Doppler Audible: [Left:Yes] Blood Pressure: Brachial: [Left:89] Dorsalis Pedis: 92 Ankle: Posterior Tibial: 68 Ankle Brachial Index: [Left:1.03] Notes Patient's legs are reddened, Morgan to touch and +2 pitting edema. Electronic Signature(s) Signed: 02/27/2021 5:13:08 PM By: [name], BSN, RN, CWS, Kim RN, BSN Entered By: [name], BSN, RN, CWS, Kim on 02/27/2021 14:13:37 RILYN, UPSHAW ([id number]) -------------------------------------------------------------------------------- Multi Wound Chart Details Patient Name: Michael, Morgan. Date of Service: 02/27/2021 12:45 PM Medical Record Number: [id number] Patient Account Number: [id number] Date of Birth/Sex: 11/24/1940 (81 y.o. M) Treating RN: [name] Primary Care Heather Streeper: [name] Other Clinician: Referring Jalaila Caradonna: [name] Michael Wade Asebedo/Extender: [name] in Treatment: 0 Vital Signs Height(in): 76 Pulse(bpm): 38 Weight(lbs): 200 Blood Pressure(mmHg): 145/73 Body Mass Index(BMI): 24.3 Temperature(F): 98.2 Respiratory Rate(breaths/min): 16 Photos: [N/A:N/A] Wound Location: Left, Lateral Calcaneus N/A N/A Wounding Event: Trauma N/A N/A Primary Etiology: Neuropathic Ulcer-Non Diabetic N/A N/A Comorbid History: Cataracts, Neuropathy N/A N/A Date Acquired: 02/07/2021 N/A N/A Morgan of Treatment: 0 N/A N/A Wound Status: Open N/A N/A Wound Recurrence: No N/A  N/A Measurements L x W x D (cm) 1.2x1.8x0.5 N/A N/A Area (cm) : 1.696 N/A N/A Volume (cm) : 0.848 N/A N/A % Reduction in Area: 0.00% N/A N/A % Reduction in Volume: 0.00% N/A N/A Classification: Full Thickness With Exposed N/A N/A Support Structures Exudate Amount: Large N/A N/A Exudate Type: Serous N/A N/A Exudate Color: amber N/A N/A Wound Margin: Flat and Intact N/A N/A Granulation Amount: Small (1-33%) N/A N/A Necrotic Amount: Large (67-100%) N/A N/A Exposed Structures: Fat Layer (Subcutaneous Tissue): N/A N/A Yes Tendon: Yes Muscle: Yes Fascia: No Joint: No Bone: No Treatment Notes Electronic Signature(s) Signed: 02/27/2021 5:13:08 PM By: [name], BSN, RN, CWS, Kim RN, BSN Entered By: [name], BSN, RN, CWS, Kim on 02/27/2021 14:16:50 Michael,  Morgan DUBIE ([id number]) -------------------------------------------------------------------------------- Multi-Disciplinary Care Plan Details Patient Name: Michael, Morgan. Date of Service: 02/27/2021 12:45 PM Medical Record Number: [id number] Patient Account Number: [id number] Date of Birth/Sex: 1940/05/22 (81 y.o. M) Treating RN: [name] Primary Care Takari Lundahl: [name] Other Clinician: Referring Vauda Salvucci: [name] Michael Nerida Boivin/Extender: [name] in Treatment: 0 Active Inactive Necrotic Tissue Nursing Diagnoses: Impaired tissue integrity related to necrotic/devitalized tissue Knowledge deficit related to management of necrotic/devitalized tissue Goals: Necrotic/devitalized tissue will be minimized in the wound bed Date Initiated: 02/27/2021 Target Resolution Date: 02/27/2021 Goal Status: Active Patient/caregiver will verbalize understanding of reason and process for debridement of necrotic tissue Date Initiated: 02/27/2021 Target Resolution Date: 02/27/2021 Goal Status: Active Interventions: Assess patient pain level pre-, during and post procedure and prior to discharge Provide education on necrotic  tissue and debridement process Treatment Activities: Excisional debridement : 02/27/2021 Notes: Orientation to the Wound Care Program Nursing Diagnoses: Knowledge deficit related to the wound healing center program Goals: Patient/caregiver will verbalize understanding of the [facility] Program Date Initiated: 02/27/2021 Target Resolution Date: 02/27/2021 Goal Status: Active Interventions: Provide education on orientation to the wound center Notes: Peripheral Neuropathy Nursing Diagnoses: Knowledge deficit related to disease process and management of peripheral neurovascular dysfunction Potential alteration in peripheral tissue perfusion (select prior to confirmation of diagnosis) Goals: Patient/caregiver will verbalize understanding of disease process and disease management Date Initiated: 02/27/2021 Target Resolution Date: 02/27/2021 Goal Status: Active Interventions: Assess signs and symptoms of neuropathy upon admission and as needed Provide education on Management of Neuropathy and Related Ulcers Treatment Activities: Michael, Morgan ([id number]) Patient referred for customized footwear/offloading : 02/27/2021 Notes: Wound/Skin Impairment Nursing Diagnoses: Impaired tissue integrity Knowledge deficit related to smoking impact on wound healing Knowledge deficit related to ulceration/compromised skin integrity Goals: Patient/caregiver will verbalize understanding of skin care regimen Date Initiated: 02/27/2021 Target Resolution Date: 02/27/2021 Goal Status: Active Ulcer/skin breakdown will have a volume reduction of 30% by week 4 Date Initiated: 02/27/2021 Target Resolution Date: 03/27/2021 Goal Status: Active Ulcer/skin breakdown will have a volume reduction of 50% by week 8 Date Initiated: 02/27/2021 Target Resolution Date: 04/24/2021 Goal Status: Active Interventions: Assess ulceration(s) every visit Treatment Activities: Skin care regimen initiated :  02/27/2021 Topical wound management initiated : 02/27/2021 Notes: Electronic Signature(s) Signed: 02/27/2021 5:13:08 PM By: [name], BSN, RN, CWS, Kim RN, BSN Entered By: [name], BSN, RN, CWS, Kim on 02/27/2021 14:16:28 Michael, Morgan ([id number]) -------------------------------------------------------------------------------- Pain Assessment Details Patient Name: Michael, Morgan. Date of Service: 02/27/2021 12:45 PM Medical Record Number: [id number] Patient Account Number: [id number] Date of Birth/Sex: [birth year] (81 y.o. M) Treating RN: [name] Primary Care Shea Swalley: [name] Other Clinician: Referring Milt Coye: [name] Michael Alana Dayton/Extender: [name] in Treatment: 0 Active Problems Location of Pain Severity and Description of Pain Patient Has Paino No Site Locations Pain Management and Medication Current Pain Management: Notes Patient has neuropathy and cannot feel pain in that area. Electronic Signature(s) Signed: 02/27/2021 5:13:08 PM By: [name], BSN, RN, CWS, Kim RN, BSN Entered By: [name], BSN, RN, CWS, Kim on 02/27/2021 13:07:14 [name] ([id number]) -------------------------------------------------------------------------------- Patient/Caregiver Education Details Patient Name: Michael, Morgan. Date of Service: 02/27/2021 12:45 PM Medical Record Number: [id number] Patient Account Number: [id number] Date of Birth/Gender: December 24, 1940 (81 y.o. M) Treating RN: [name] Primary Care Physician: [name] Other Clinician: Referring Physician: Elsie Morgan Michael Physician/Extender: [name] in Treatment: 0 Education Assessment Education Provided To: Patient Education Topics Provided Peripheral Neuropathy: Handouts: Neuropathy Methods:  Demonstration, Explain/Verbal Responses: State content correctly Welcome To [facility]: Handouts: Welcome To [facility] Methods: Demonstration,  Explain/Verbal Responses: State content correctly Wound Debridement: Handouts: Wound Debridement Methods: Demonstration, Explain/Verbal Responses: State content correctly Electronic Signature(s) Signed: 02/27/2021 5:13:08 PM By: [name], BSN, RN, CWS, Kim RN, BSN Entered By: [name], BSN, RN, CWS, Kim on 02/27/2021 14:41:52 Michael, Morgan ([id number]) -------------------------------------------------------------------------------- Wound Assessment Details Patient Name: Michael, Morgan. Date of Service: 02/27/2021 12:45 PM Medical Record Number: [id number] Patient Account Number: [id number] Date of Birth/Sex: 24-Dec-1940 (81 y.o. M) Treating RN: [name] Primary Care Amere Bricco: [name] Other Clinician: Referring Brandice Busser: [name] Michael Viraj Liby/Extender: [name] in Treatment: 0 Wound Status Wound Number: 1 Primary Etiology: Neuropathic Ulcer-Non Diabetic Wound Location: Left, Lateral Ankle Wound Status: Open Wounding Event: Trauma Comorbid History: Cataracts, Neuropathy Date Acquired: 02/07/2021 Morgan Of Treatment: 0 Clustered Wound: No Photos Wound Measurements Length: (cm) 1.2 Width: (cm) 1.8 Depth: (cm) 0.5 Area: (cm) 1.696 Volume: (cm) 0.848 % Reduction in Area: 0% % Reduction in Volume: 0% Wound Description Classification: Full Thickness With Exposed Support Structures Wound Margin: Flat and Intact Exudate Amount: Large Exudate Type: Serous Exudate Color: amber Foul Odor After Cleansing: No Slough/Fibrino Yes Wound Bed Granulation Amount: Small (1-33%) Exposed Structure Necrotic Amount: Large (67-100%) Fascia Exposed: No Necrotic Quality: Adherent Slough Fat Layer (Subcutaneous Tissue) Exposed: Yes Tendon Exposed: Yes Muscle Exposed: Yes Necrosis of Muscle: No Joint Exposed: No Bone Exposed: No Electronic Signature(s) Signed: 02/27/2021 6:05:38 PM By: Worthy Keeler PA-C Signed: 03/17/2021 7:47:47 AM By: [name], BSN, RN, CWS, Kim  RN, BSN Previous Signature: 02/27/2021 5:13:08 PM Version By: [name], BSN, RN, CWS, Kim RN, BSN Entered By: Worthy Keeler on 02/27/2021 18:05:38 DEKLEN, POPELKA ([id number]) -------------------------------------------------------------------------------- [place] Details Patient Name: NISHANTH, MCCAUGHAN. Date of Service: 02/27/2021 12:45 PM Medical Record Number: [id number] Patient Account Number: [id number] Date of Birth/Sex: 05-Oct-1940 (81 y.o. M) Treating RN: [name] Primary Care Sophie Quiles: [name] Other Clinician: Referring Jamoni Hewes: [name] Michael Reola Buckles/Extender: [name] in Treatment: 0 Vital Signs Time Taken: 13:07 Temperature (F): 98.2 Height (in): 76 Pulse (bpm): 83 Weight (lbs): 200 Respiratory Rate (breaths/min): 16 Body Mass Index (BMI): 24.3 Blood Pressure (mmHg): 145/73 Reference Range: 80 - 120 mg / dl Electronic Signature(s) Signed: 02/27/2021 5:13:08 PM By: [name], BSN, RN, CWS, Kim RN, BSN Entered By: [name], BSN, RN, CWS, Kim on 02/27/2021 13:07:54

## 2021-02-27 NOTE — Progress Notes (Signed)
ANEESH, FALLER ([id number]) Visit Report for 02/27/2021 Abuse Risk Screen Details Patient Name: [name], [name]. Date of Service: 02/27/2021 12:45 PM Medical Record Number: [id number] Patient Account Number: [id number] Date of Birth/Sex: [birth year] (81 y.o. Male) Treating RN: Cornell Barman Primary Care Rayyan Burley: Elsie Stain Other Clinician: Referring Sherrel Ploch: Elsie Stain Treating Adriann Ballweg/Extender: Skipper Cliche in Treatment: 0 Abuse Risk Screen Items Answer ABUSE RISK SCREEN: Has anyone close to you tried to hurt or harm you recentlyo No Do you feel uncomfortable with anyone in your familyo No Has anyone forced you do things that you didnot want to doo No Electronic Signature(s) Signed: 02/27/2021 5:13:08 PM By: Gretta Cool, BSN, RN, CWS, Kim RN, BSN Entered By: Gretta Cool, BSN, RN, CWS, Kim on 02/27/2021 13:13:43 [name] ([id number]) -------------------------------------------------------------------------------- Activities of Daily Living Details Patient Name: [name], [name]. Date of Service: 02/27/2021 12:45 PM Medical Record Number: [id number] Patient Account Number: [id number] Date of Birth/Sex: August 26, 1940 (81 y.o. Male) Treating RN: Cornell Barman Primary Care Glenna Brunkow: Elsie Stain Other Clinician: Referring Santos Sollenberger: Elsie Stain Treating London Tarnowski/Extender: Skipper Cliche in Treatment: 0 Activities of Daily Living Items Answer Activities of Daily Living (Please select one for each item) Drive Automobile Not Able Take Medications Completely Able Use Telephone Completely Able Care for Appearance Completely Able Use Toilet Completely Able Bath / Shower Completely Able Dress Self Completely Able Feed Self Completely Able Walk Completely Able Get In / Out Bed Completely Able Housework Completely Able Prepare Meals Completely Able Handle Money Completely Able Shop for Self Completely Able Electronic Signature(s) Signed: 02/27/2021 5:13:08 PM By: Gretta Cool, BSN,  RN, CWS, Kim RN, BSN Entered By: Gretta Cool, BSN, RN, CWS, Kim on 02/27/2021 13:14:16 [name] ([id number]) -------------------------------------------------------------------------------- Education Screening Details Patient Name: [name], [name]. Date of Service: 02/27/2021 12:45 PM Medical Record Number: [id number] Patient Account Number: [id number] Date of Birth/Sex: [birth year] (81 y.o. Male) Treating RN: Cornell Barman Primary Care Patrice Matthew: Elsie Stain Other Clinician: Referring Becket Wecker: Elsie Stain Treating Varick Keys/Extender: Skipper Cliche in Treatment: 0 Primary Learner Assessed: Patient Learning Preferences/Education Level/Primary Language Learning Preference: Explanation, Demonstration Highest Education Level: College or Above Preferred Language: English Cognitive Barrier Language Barrier: No Translator Needed: No Memory Deficit: No Emotional Barrier: No Cultural/Religious Beliefs Affecting Medical Care: No Physical Barrier Impaired Vision: No Impaired Hearing: No Decreased Hand dexterity: No Knowledge/Comprehension Knowledge Level: High Comprehension Level: High Ability to understand written instructions: High Ability to understand verbal instructions: High Motivation Anxiety Level: Calm Cooperation: Cooperative Education Importance: Acknowledges Need Interest in Health Problems: Asks Questions Perception: Coherent Willingness to Engage in Self-Management High Activities: Readiness to Engage in Self-Management High Activities: Engineer, maintenance) Signed: 02/27/2021 5:13:08 PM By: Gretta Cool, BSN, RN, CWS, Kim RN, BSN Entered By: Gretta Cool, BSN, RN, CWS, Kim on 02/27/2021 13:15:44 [name], [name] ([id number]) -------------------------------------------------------------------------------- Fall Risk Assessment Details Patient Name: [name]. Date of Service: 02/27/2021 12:45 PM Medical Record Number: [id number] Patient Account Number:  [id number] Date of Birth/Sex: 1940-04-01 (81 y.o. Male) Treating RN: Cornell Barman Primary Care Ricci Dirocco: Elsie Stain Other Clinician: Referring Lorraine Cimmino: Elsie Stain Treating Orville Mena/Extender: Skipper Cliche in Treatment: 0 Fall Risk Assessment Items Have you had 2 or more falls in the last 12 monthso 0 No Have you had any fall that resulted in injury in the last 12 monthso 0 No FALLS RISK SCREEN History of falling - immediate or within 3 months 0 No Secondary diagnosis (Do you have 2 or more medical diagnoseso) 15 Yes Ambulatory aid None/bed rest/wheelchair/nurse 0 No Crutches/cane/walker  15 Yes Furniture 0 No Intravenous therapy Access/Saline/Heparin Lock 0 No Gait/Transferring Normal/ bed rest/ wheelchair 0 No Weak (short steps with or without shuffle, stooped but able to lift head while walking, may 10 Yes seek support from furniture) Impaired (short steps with shuffle, may have difficulty arising from chair, head down, impaired 0 No balance) Mental Status Oriented to own ability 0 Yes Electronic Signature(s) Signed: 02/27/2021 5:13:08 PM By: Gretta Cool, BSN, RN, CWS, Kim RN, BSN Entered By: Gretta Cool, BSN, RN, CWS, Kim on 02/27/2021 13:16:20 [name], [name] ([id number]) -------------------------------------------------------------------------------- Foot Assessment Details Patient Name: [name], [name]. Date of Service: 02/27/2021 12:45 PM Medical Record Number: [id number] Patient Account Number: [id number] Date of Birth/Sex: 06-30-1940 (81 y.o. Male) Treating RN: Cornell Barman Primary Care Loula Marcella: Elsie Stain Other Clinician: Referring Hezzie Karim: Elsie Stain Treating Kaida Games/Extender: Jeri Cos Weeks in Treatment: 0 Foot Assessment Items Site Locations + = Sensation present, - = Sensation absent, C = Callus, U = Ulcer R = Redness, W = Warmth, M = Maceration, PU = Pre-ulcerative lesion F = Fissure, S = Swelling, D = Dryness Assessment Right: Left: Other  Deformity: No No Prior Foot Ulcer: No No Prior Amputation: No No Charcot Joint: No No Ambulatory Status: Ambulatory With Help Assistance Device: Walker Gait: Administrator, arts) Signed: 02/27/2021 5:13:08 PM By: Gretta Cool, BSN, RN, CWS, Kim RN, BSN Entered By: Gretta Cool, BSN, RN, CWS, Kim on 02/27/2021 13:16:55 [name] ([id number]) -------------------------------------------------------------------------------- Nutrition Risk Screening Details Patient Name: [name], [name]. Date of Service: 02/27/2021 12:45 PM Medical Record Number: [id number] Patient Account Number: [id number] Date of Birth/Sex: November 13, 1940 (81 y.o. Male) Treating RN: Cornell Barman Primary Care Tarl Cephas: Elsie Stain Other Clinician: Referring Maxtyn Nuzum: Elsie Stain Treating Tiphany Fayson/Extender: Jeri Cos Weeks in Treatment: 0 Height (in): 76 Weight (lbs): 200 Body Mass Index (BMI): 24.3 Nutrition Risk Screening Items Score Screening NUTRITION RISK SCREEN: I have an illness or condition that made me change the kind and/or amount of food I eat 0 No I eat fewer than two meals per day 0 No I eat few fruits and vegetables, or milk products 0 No I have three or more drinks of beer, liquor or wine almost every day 0 No I have tooth or mouth problems that make it hard for me to eat 0 No I don't always have enough money to buy the food I need 0 No I eat alone most of the time 0 No I take three or more different prescribed or over-the-counter drugs a day 0 No Without wanting to, I have lost or gained 10 pounds in the last six months 0 No I am not always physically able to shop, cook and/or feed myself 0 No Nutrition Protocols Good Risk Protocol 0 No interventions needed Moderate Risk Protocol High Risk Proctocol Risk Level: Good Risk Score: 0 Electronic Signature(s) Signed: 02/27/2021 5:13:08 PM By: Gretta Cool, BSN, RN, CWS, Kim RN, BSN Entered By: Gretta Cool, BSN, RN, CWS, Kim on 02/27/2021 13:16:29

## 2021-02-28 NOTE — Progress Notes (Signed)
[name] ([id number]) Visit Report for 02/27/2021 Chief Complaint Document Details Patient Name: [name], [name]. Date of Service: 02/27/2021 12:45 PM Medical Record Number: [id number] Patient Account Number: [id number] Date of Birth/Sex: Jun 30, 1940 (81 y.o. Male) Treating RN: Cornell Barman Primary Care Provider: Elsie Stain Other Clinician: Referring Provider: Elsie Stain Treating Provider/Extender: Skipper Cliche in Treatment: 0 Information Obtained from: Patient Chief Complaint Left LE Ulcer Electronic Signature(s) Signed: 02/27/2021 2:09:26 PM By: Worthy Keeler PA-C Entered By: Worthy Keeler on 02/27/2021 14:09:26 [name] ([id number]) -------------------------------------------------------------------------------- Debridement Details Patient Name: [name], [name]. Date of Service: 02/27/2021 12:45 PM Medical Record Number: [id number] Patient Account Number: [id number] Date of Birth/Sex: [birth year] (81 y.o. Male) Treating RN: Cornell Barman Primary Care Provider: Elsie Stain Other Clinician: Referring Provider: Elsie Stain Treating Provider/Extender: Jeri Cos Weeks in Treatment: 0 Debridement Performed for Wound #1 Left,Lateral Calcaneus Assessment: Performed By: Physician Tommie Sams., PA-C Debridement Type: Debridement Level of Consciousness (Pre- Awake and Alert procedure): Pre-procedure Verification/Time Out Yes - 14:17 Taken: Total Area Debrided (L x W): 1.2 (cm) x 1.8 (cm) = 2.16 (cm) Tissue and other material Viable, Non-Viable, Slough, Subcutaneous, Tendon, Slough debrided: Level: Skin/Subcutaneous Tissue/Muscle Debridement Description: Excisional Instrument: Curette Bleeding: Moderate Hemostasis Achieved: Pressure Response to Treatment: Procedure was tolerated well Level of Consciousness (Post- Awake and Alert procedure): Post Debridement Measurements of Total Wound Length: (cm) 1.2 Width: (cm) 1.8 Depth: (cm)  0.7 Volume: (cm) 1.188 Character of Wound/Ulcer Post Debridement: Stable Post Procedure Diagnosis Same as Pre-procedure Electronic Signature(s) Signed: 02/27/2021 5:13:08 PM By: Gretta Cool, BSN, RN, CWS, Kim RN, BSN Signed: 02/27/2021 6:09:14 PM By: Worthy Keeler PA-C Entered By: Gretta Cool, BSN, RN, CWS, Kim on 02/27/2021 14:18:19 [name], [name] ([id number]) -------------------------------------------------------------------------------- HPI Details Patient Name: [name]. Date of Service: 02/27/2021 12:45 PM Medical Record Number: [id number] Patient Account Number: [id number] Date of Birth/Sex: 1940/03/03 (81 y.o. Male) Treating RN: Cornell Barman Primary Care Provider: Elsie Stain Other Clinician: Referring Provider: Elsie Stain Treating Provider/Extender: Skipper Cliche in Treatment: 0 History of Present Illness HPI Description: 02/27/2021 upon evaluation today patient presents for initial evaluation here in the clinic sent concerning an issue on the left lateral heel which has been present currently for about 3 weeks that he notes. With that being said he did have an issue where he hit this area on a lawnmower over the summer 2022. Nonetheless the patient tells me that unfortunately though it did not seem to give him a lot of trouble immediately following this reopened more recently. He has not had any x-rays up to this time. 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 Subsequently he did have a fairly good arterial test today here in the clinic measured at 1.03 but I am somewhat cautious with that due to the fact that his extremity is very cold nonetheless he seems to have pretty good blood flow of the wound when I am cleaning this  today. We will keep an eye on this and if I need to send him for formal arterial testing I will do so. Electronic Signature(s) Signed: 02/27/2021 6:06:49 PM By: Worthy Keeler PA-C Entered By: Worthy Keeler on 02/27/2021 18:06:49 [name] ([id number]) -------------------------------------------------------------------------------- Physical Exam Details Patient  Name: Michael Montgomery R. Date of Service: 02/27/2021 12:45 PM Medical Record Number: [id number] Patient Account Number: [id number] Date of Birth/Sex: 27-Jun-1940 (81 y.o. Male) Treating RN: Cornell Barman Primary Care Provider: Elsie Stain Other Clinician: Referring Provider: Elsie Stain Treating Provider/Extender: Jeri Cos Weeks in Treatment: 0 Constitutional sitting or standing blood pressure is within target range for patient.Marland Kitchen respirations regular, non-labored and within target range for patient.Marland Kitchen temperature within target range for patient.. Well-nourished and well-hydrated in no acute distress. Eyes conjunctiva clear no eyelid edema noted. pupils equal round and reactive to light and accommodation. Ears, Nose, Mouth, and Throat no gross abnormality of ear auricles or external auditory canals. normal hearing noted during conversation. mucus membranes moist. Respiratory normal breathing without difficulty. Cardiovascular 2+ dorsalis pedis/posterior tibialis pulses. no clubbing, cyanosis, significant edema, <3 sec cap refill. Musculoskeletal normal gait and posture. no significant deformity or arthritic changes, no loss or range of motion, no clubbing. Psychiatric this patient is able to make decisions and demonstrates good insight into disease process. Alert and Oriented x 3. pleasant and cooperative. Notes Upon inspection patient's wound bed actually showed signs of good granulation and epithelization at this point. Fortunately I do not see any signs of active infection locally nor systemically at this time  which is great news. He did have some necrotic tissue that I did have to perform debridement on today to clear away some of the necrotic debris. He tolerated that without complication and postdebridement the wound bed appears to be doing much better. Nonetheless this is a fairly deep wound with tendon and muscle exposed there is no bone exposure but it is very close. Electronic Signature(s) Signed: 02/27/2021 6:07:41 PM By: Worthy Keeler PA-C Entered By: Worthy Keeler on 02/27/2021 18:07:41 [name], [name] ([id number]) -------------------------------------------------------------------------------- Physician Orders Details Patient Name: [name], [name]. Date of Service: 02/27/2021 12:45 PM Medical Record Number: [id number] Patient Account Number: [id number] Date of Birth/Sex: 10-29-1940 (81 y.o. 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 Bathing/ Shower/ Hygiene Wound #1 Left,Lateral Calcaneus o May shower with wound dressing protected with water repellent cover or cast protector. Edema Control - Lymphedema / Segmental Compressive Device / Other o Elevate, Exercise Daily and Avoid Standing for Long Periods of Time. o Elevate legs to the level of the heart and pump ankles as often as possible o Elevate leg(s) parallel to the floor when sitting. Wound Treatment Wound #1 - Calcaneus Wound Laterality: Left, Lateral Cleanser: Soap and Water Discharge Instructions: Gently cleanse wound with antibacterial  soap, rinse and pat dry prior to dressing wounds Primary Dressing: Promogran Matrix 4.34 (in) Discharge Instructions: Moisten w/normal saline or sterile water; Cover wound as directed. Do not remove from wound bed. Secondary Dressing: ABD Pad 5x9 (in/in) Discharge Instructions: Cover with ABD pad Compression Wrap: Profore Lite LF 3 Multilayer Compression Bandaging System Discharge Instructions: Apply 3 multi-layer wrap as prescribed. Radiology o X-ray, foot - 3-view wound on left lateral ankle Electronic Signature(s) Signed: 02/27/2021 5:13:08 PM By: Gretta Cool, BSN, RN, CWS, Kim RN, BSN Signed: 02/27/2021 6:09:14 PM By: Worthy Keeler PA-C Entered By: Gretta Cool, BSN, RN, CWS, Kim on  02/27/2021 14:41:01 [name], [name] ([id number]) -------------------------------------------------------------------------------- Problem List Details Patient Name: [name], [name]. Date of Service: 02/27/2021 12:45 PM Medical Record Number: [id number] Patient Account Number: [id number] Date of Birth/Sex: 11/15/1940 (81 y.o. 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 Date of Service: 02/27/2021 12:45 PM Medical Record Number: [id number] Patient  Account Number: [id number] Date of Birth/Sex: 1940/09/25 (81 y.o. Male) Treating RN: Cornell Barman Primary Care Provider: Elsie Stain Other Clinician: Referring Provider: Elsie Stain Treating Provider/Extender: Skipper Cliche in Treatment: 0 Subjective Chief Complaint Information obtained from Patient Left LE Ulcer History of Present Illness (HPI) 02/27/2021 upon evaluation today patient presents for initial evaluation here in the clinic sent concerning an issue on the left lateral heel which has been present currently for about 3 weeks that he notes. With that being said he did have an issue where he hit this area on a lawnmower over the summer 2022. Nonetheless the patient tells me that unfortunately though it did not seem to give him a lot of trouble immediately following this reopened more recently. He has not had any x-rays up to this time. 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 Subsequently he did have a fairly good arterial test today here in the clinic measured at 1.03 but I am somewhat cautious with that due to the fact that his extremity is very cold nonetheless he seems to have pretty good blood flow of the wound when I am cleaning this today. We will keep an eye on this and if I need to send him for formal arterial testing I will do so. Patient History Information obtained from Patient, Chart. Allergies Sulfa (Sulfonamide Antibiotics) Family History No family history of Diabetes. Social History Never smoker, Marital Status - Married, Alcohol Use - Never, Drug Use - No History, Caffeine Use - Never. Medical History Eyes Patient has history of Cataracts - removed Endocrine Denies history of Type I Diabetes, Type II  Diabetes Neurologic Patient has history of Neuropathy - Hands and feet Oncologic Denies history of Received Chemotherapy Review of Systems (ROS) Constitutional Symptoms (General Health) Denies complaints or symptoms of Fatigue, Fever, Chills. Ear/Nose/Mouth/Throat Denies complaints or symptoms of Difficult clearing ears, Sinusitis. Hematologic/Lymphatic Denies complaints or symptoms of Bleeding / Clotting Disorders, Human Immunodeficiency Virus. Respiratory Denies complaints or symptoms of Chronic or frequent coughs, Shortness of Breath. Cardiovascular Complains or has symptoms of LE edema. Denies complaints or symptoms of Chest pain. Integumentary (Skin) Complains or has symptoms of Wounds, Bleeding or bruising tendency. Musculoskeletal Complains or has symptoms of Muscle Pain, Muscle Weakness. Oncologic Leukemia Psychiatric Denies complaints or symptoms of Anxiety, Claustrophobia. [name], [name] ([id number]) Objective Constitutional sitting or standing blood pressure is within  target range for patient.Marland Kitchen respirations regular, non-labored and within target range for patient.Marland Kitchen temperature within target range for patient.. Well-nourished and well-hydrated in no acute distress. Vitals Time Taken: 1:07 PM, Height: 76 in, Weight: 200 lbs, BMI: 24.3, Temperature: 98.2 F, Pulse: 83 bpm, Respiratory Rate: 16 breaths/min, Blood Pressure: 145/73 mmHg. Eyes conjunctiva clear no eyelid edema noted. pupils equal round and reactive to light and accommodation. Ears, Nose, Mouth, and Throat no gross abnormality of ear auricles or external auditory canals. normal hearing noted during conversation. mucus membranes moist. Respiratory normal breathing without difficulty. Cardiovascular 2+ dorsalis pedis/posterior tibialis pulses. no clubbing, cyanosis, significant edema, Musculoskeletal normal gait and posture. no significant deformity or arthritic changes, no loss or range of motion, no  clubbing. Psychiatric this patient is able to make decisions and demonstrates good insight into disease process. Alert and Oriented x 3. pleasant and cooperative. General Notes: Upon inspection patient's wound bed actually showed signs of good granulation and epithelization at this point. Fortunately I do not see any signs of active infection locally nor systemically at this time which is great news. He did have some necrotic tissue that I did have to perform debridement on today to clear away some of the necrotic debris. He tolerated that without complication and postdebridement the wound bed appears to be doing much better. Nonetheless this is a fairly deep wound with tendon and muscle exposed there is no bone exposure but it is very close. Integumentary (Hair, Skin) Wound #1 status is Open. Original cause of wound was Trauma. The date acquired was: 02/07/2021. The wound is located on the Left,Lateral Ankle. The wound measures 1.2cm length x 1.8cm width x 0.5cm depth; 1.696cm^2 area and 0.848cm^3 volume. There is muscle, tendon, and Fat Layer (Subcutaneous Tissue) exposed. There is a large amount of serous drainage noted. The wound margin is flat and intact. There is small (1-33%) granulation within the wound bed. There is a large (67-100%) amount of necrotic tissue within the wound bed including Adherent Slough. 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 There was a Excisional Skin/Subcutaneous Tissue/Muscle Debridement with a total area of 2.16 sq cm performed by Tommie Sams., PA-C. With the following instrument (s): Curette to remove Viable and Non-Viable tissue/material. Material removed includes Tendon,  Subcutaneous Tissue, and Slough. No specimens were taken. A time out was conducted at 14:17, prior to the start of the procedure. A Moderate amount of bleeding was controlled with Pressure. The procedure was tolerated well. Post Debridement Measurements: 1.2cm length x 1.8cm width x 0.7cm depth; 1.188cm^3 volume. Character of Wound/Ulcer Post Debridement is stable. Post procedure Diagnosis Wound #1: Same as Pre-Procedure [name], [name] ([id number]) Plan Follow-up Appointments: Wound #1 Left,Lateral Calcaneus: Return Appointment in 1 week. Bathing/ Shower/ Hygiene: Wound #1 Left,Lateral Calcaneus: May shower with wound dressing protected with water repellent cover or cast protector. Edema Control - Lymphedema / Segmental Compressive Device / Other: Elevate, Exercise Daily and Avoid Standing for Long Periods of Time. Elevate legs to the level of the heart and pump ankles as often as possible Elevate leg(s) parallel to the floor when sitting. Radiology ordered were: X-ray, foot - 3-view wound on left lateral ankle WOUND #1: - Calcaneus Wound Laterality: Left, Lateral Cleanser: Soap and Water Discharge Instructions: Gently cleanse wound with  antibacterial soap, rinse and pat dry prior to dressing wounds Primary Dressing: Promogran Matrix 4.34 (in) Discharge Instructions: Moisten w/normal saline or sterile water; Cover wound as directed. Do not remove from wound bed. Secondary Dressing: ABD Pad 5x9 (in/in) Discharge Instructions: Cover with ABD pad Compression Wrap: Profore Lite LF 3 Multilayer Compression Bandaging System Discharge Instructions: Apply 3 multi-layer wrap as prescribed. 1. I am going to send the patient for an x-ray of his ankle which I think will be appropriate. We will see what this shows and then make any adjustments in care as we need to following. I am concerned about the possibility of infection here. Again explained the x-rays not the perfect test for this but is  the best first test. If we still have concerns I may proceed with an MRI. 2. I am also can recommend at this time that we have the patient continue with an initiation of silver collagen to the wound. Overall I think this will be good for him as well. 3. I am also going to suggest an ABD pad to cover followed by 3 layer compression wrap to help with his edema control. He does have significant edema as well which is also of concern. We will see patient back for reevaluation in 1 week here in the clinic. If anything worsens or changes patient will contact our office for additional recommendations. Electronic Signature(s) Signed: 02/27/2021 6:08:34 PM By: Worthy Keeler PA-C Entered By: Worthy Keeler on 02/27/2021 18:08:34 [name], [name] ([id number]) -------------------------------------------------------------------------------- ROS/PFSH Details Patient Name: [name], [name]. Date of Service: 02/27/2021 12:45 PM Medical Record Number: [id number] Patient Account Number: [id number] Date of Birth/Sex: 1940/11/24 (81 y.o. Male) Treating RN: Cornell Barman Primary Care Provider: Elsie Stain Other Clinician: Referring Provider: Elsie Stain Treating Provider/Extender: Skipper Cliche in Treatment: 0 Information Obtained From Patient Chart Constitutional Symptoms (General Health) Complaints and Symptoms: Negative for: Fatigue; Fever; Chills Ear/Nose/Mouth/Throat Complaints and Symptoms: Negative for: Difficult clearing ears; Sinusitis Hematologic/Lymphatic Complaints and Symptoms: Negative for: Bleeding / Clotting Disorders; Human Immunodeficiency Virus Respiratory Complaints and Symptoms: Negative for: Chronic or frequent coughs; Shortness of Breath Cardiovascular Complaints and Symptoms: Positive for: LE edema Negative for: Chest pain Integumentary (Skin) Complaints and Symptoms: Positive for: Wounds; Bleeding or bruising tendency Musculoskeletal Complaints and  Symptoms: Positive for: Muscle Pain; Muscle Weakness Psychiatric Complaints and Symptoms: Negative for: Anxiety; Claustrophobia Eyes Medical History: Positive for: Cataracts - removed Endocrine Medical History: Negative for: Type I Diabetes; Type II Diabetes Neurologic Medical History: Positive for: Neuropathy - Hands and feet KEAHI, MCCARNEY ([id number]) Oncologic Complaints and Symptoms: Review of System Notes: Leukemia Medical History: Negative for: Received Chemotherapy HBO Extended History Items Eyes: Cataracts Immunizations Pneumococcal Vaccine: Received Pneumococcal Vaccination: Yes Received Pneumococcal Vaccination On or After 60th Birthday: Yes Implantable Devices None Family and Social History Diabetes: No; Never smoker; Marital Status - Married; Alcohol Use: Never; Drug Use: No History; Caffeine Use: Never Electronic Signature(s) Signed: 02/27/2021 5:13:08 PM By: Gretta Cool, BSN, RN, CWS, Kim RN, BSN Signed: 02/27/2021 6:09:14 PM By: Worthy Keeler PA-C Entered By: Gretta Cool BSN, RN, CWS, Kim on 02/27/2021 13:13:32 JAQUAWN, SAFFRAN ([id number]) -------------------------------------------------------------------------------- [place] Details Patient Name: WINFERD, WEASE. Date of Service: 02/27/2021 Medical Record Number: [id number] Patient Account Number: [id number] Date of Birth/Sex: 01-18-1941 (81 y.o. Male) Treating RN: Cornell Barman Primary Care Provider: Elsie Stain Other Clinician: Referring Provider: Elsie Stain Treating Provider/Extender: Jeri Cos Weeks in Treatment: 0 Diagnosis Coding ICD-10 Codes Code Description G90.09 Other idiopathic peripheral  autonomic neuropathy L97.422 Non-pressure chronic ulcer of left heel and midfoot with fat layer exposed Z85.6 Personal history of leukemia Facility Procedures CPT4 Code: 00511021 Description: 11735 - WOUND CARE VISIT-LEV 3 EST PT Modifier: Quantity: 1 CPT4 Code: [id number] Description: 01314 - DEB  MUSC/FASCIA 20 SQ CM/< Modifier: Quantity: 1 CPT4 Code: Description: ICD-10 Diagnosis Description H88.875 Non-pressure chronic ulcer of left heel and midfoot with fat layer expo Modifier: sed Quantity: Physician Procedures CPT4 Code: 7972820 Description: 60156 - WC PHYS LEVEL 4 - NEW PT Modifier: 25 Quantity: 1 CPT4 Code: Description: ICD-10 Diagnosis Description 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

## 2021-03-02 ENCOUNTER — Ambulatory Visit
Admission: RE | Admit: 2021-03-02 | Discharge: 2021-03-02 | Disposition: A | Discharge status: Home / Self Care | Payer: Medicare PPO | Source: Ambulatory Visit | Attending: Physician Assistant | Admitting: Physician Assistant

## 2021-03-02 ENCOUNTER — Ambulatory Visit
Admission: RE | Admit: 2021-03-02 | Discharge: 2021-03-02 | Disposition: A | Discharge status: Home / Self Care | Payer: Medicare PPO | Attending: Physician Assistant | Admitting: Physician Assistant

## 2021-03-02 ENCOUNTER — Other Ambulatory Visit: Payer: Self-pay | Admitting: Physician Assistant

## 2021-03-02 DIAGNOSIS — S81802A Unspecified open wound, left lower leg, initial encounter: Secondary | ICD-10-CM | POA: Diagnosis not present

## 2021-03-02 DIAGNOSIS — R600 Localized edema: Secondary | ICD-10-CM | POA: Diagnosis not present

## 2021-03-04 ENCOUNTER — Ambulatory Visit: Payer: Medicare PPO | Admitting: Physician Assistant

## 2021-03-06 ENCOUNTER — Other Ambulatory Visit: Payer: Self-pay | Admitting: Physician Assistant

## 2021-03-06 ENCOUNTER — Ambulatory Visit
Admission: RE | Admit: 2021-03-06 | Discharge: 2021-03-06 | Disposition: A | Discharge status: Home / Self Care | Payer: Medicare PPO | Source: Ambulatory Visit | Attending: Physician Assistant | Admitting: Physician Assistant

## 2021-03-06 ENCOUNTER — Ambulatory Visit
Admission: RE | Admit: 2021-03-06 | Discharge: 2021-03-06 | Disposition: A | Discharge status: Home / Self Care | Payer: Medicare PPO | Attending: Physician Assistant | Admitting: Physician Assistant

## 2021-03-06 ENCOUNTER — Other Ambulatory Visit: Payer: Self-pay

## 2021-03-06 ENCOUNTER — Encounter: Payer: Medicare PPO | Attending: Physician Assistant

## 2021-03-06 DIAGNOSIS — T148XXA Other injury of unspecified body region, initial encounter: Secondary | ICD-10-CM

## 2021-03-06 DIAGNOSIS — M19072 Primary osteoarthritis, left ankle and foot: Secondary | ICD-10-CM | POA: Diagnosis not present

## 2021-03-06 DIAGNOSIS — M7732 Calcaneal spur, left foot: Secondary | ICD-10-CM | POA: Diagnosis not present

## 2021-03-06 DIAGNOSIS — L97422 Non-pressure chronic ulcer of left heel and midfoot with fat layer exposed: Secondary | ICD-10-CM | POA: Insufficient documentation

## 2021-03-06 DIAGNOSIS — G9009 Other idiopathic peripheral autonomic neuropathy: Secondary | ICD-10-CM | POA: Diagnosis not present

## 2021-03-06 DIAGNOSIS — S91002A Unspecified open wound, left ankle, initial encounter: Secondary | ICD-10-CM | POA: Diagnosis not present

## 2021-03-06 DIAGNOSIS — Z856 Personal history of leukemia: Secondary | ICD-10-CM | POA: Diagnosis not present

## 2021-03-06 NOTE — Progress Notes (Signed)
LOYAL, HOLZHEIMER ([id number]) Visit Report for 03/06/2021 Arrival Information Details Patient Name: Michael, Morgan. Date of Service: 03/06/2021 10:45 AM Medical Record Number: [id number] Patient Account Number: [id number] Date of Birth/Sex: 09-Jun-1940 (81 y.o. M) Treating RN: Donnamarie Poag Primary Care Laylaa Guevarra: Elsie Stain Other Clinician: Referring Erlinda Solinger: Elsie Stain Treating Edras Wilford/Extender: Skipper Cliche in Treatment: 1 Visit Information History Since Last Visit Added or deleted any medications: No Patient Arrived: Wheel Chair Had a fall or experienced change in No Arrival Time: 10:31 activities of daily living that may affect Accompanied By: son risk of falls: Transfer Assistance: None Hospitalized since last visit: No Patient Identification Verified: Yes Has Dressing in Place as Prescribed: Yes Secondary Verification Process Completed: Yes Has Compression in Place as Prescribed: Yes Patient Has Alerts: Yes Pain Present Now: No Patient Alerts: Patient on Blood Thinner 81MG  x 2 NOT DIABETIC Electronic Signature(s) Signed: 03/06/2021 12:52:22 PM By: Donnamarie Poag Entered By: Donnamarie Poag on 03/06/2021 10:51:27 [name] ([id number]) -------------------------------------------------------------------------------- Compression Therapy Details Patient Name: [name]. Date of Service: 03/06/2021 10:45 AM Medical Record Number: [id number] Patient Account Number: [id number] Date of Birth/Sex: 03/05/1940 (81 y.o. M) Treating RN: Donnamarie Poag Primary Care Jaicee Michelotti: Elsie Stain Other Clinician: Referring Kaydence Baba: Elsie Stain Treating Suhaylah Wampole/Extender: Skipper Cliche in Treatment: 1 Compression Therapy Performed for Wound Assessment: Wound #1 Left,Lateral Ankle Performed By: Clinician Donnamarie Poag, RN Compression Type: Three Layer Electronic Signature(s) Signed: 03/06/2021 12:52:22 PM By: Donnamarie Poag Entered By: Donnamarie Poag on 03/06/2021  10:53:20 [name] ([id number]) -------------------------------------------------------------------------------- Encounter Discharge Information Details Patient Name: [name], [name]. Date of Service: 03/06/2021 10:45 AM Medical Record Number: [id number] Patient Account Number: [id number] Date of Birth/Sex: Sep 22, 1940 (81 y.o. M) Treating RN: Donnamarie Poag Primary Care Kerin Kren: Elsie Stain Other Clinician: Referring Alexandros Ewan: Elsie Stain Treating Benjerman Molinelli/Extender: Skipper Cliche in Treatment: 1 Encounter Discharge Information Items Discharge Condition: Stable Ambulatory Status: Wheelchair Discharge Destination: Home Transportation: Private Auto Accompanied By: son Schedule Follow-up Appointment: Yes Clinical Summary of Care: Electronic Signature(s) Signed: 03/06/2021 12:52:22 PM By: Donnamarie Poag Entered By: Donnamarie Poag on 03/06/2021 11:02:13 [name], [name] ([id number]) -------------------------------------------------------------------------------- Wound Assessment Details Patient Name: [name], [name]. Date of Service: 03/06/2021 10:45 AM Medical Record Number: [id number] Patient Account Number: [id number] Date of Birth/Sex: Mar 25, 1940 (81 y.o. M) Treating RN: Donnamarie Poag Primary Care Saniya Tranchina: Elsie Stain Other Clinician: Referring Lynnetta Tom: Elsie Stain Treating Mykah Bellomo/Extender: Jeri Cos Weeks in Treatment: 1 Wound Status Wound Number: 1 Primary Etiology: Neuropathic Ulcer-Non Diabetic Wound Location: Left, Lateral Ankle Wound Status: Open Wounding Event: Trauma Comorbid History: Cataracts, Neuropathy Date Acquired: 02/07/2021 Weeks Of Treatment: 1 Clustered Wound: No Wound Measurements Length: (cm) 1.2 Width: (cm) 1.8 Depth: (cm) 0.5 Area: (cm) 1.696 Volume: (cm) 0.848 % Reduction in Area: 0% % Reduction in Volume: 0% Wound Description Classification: Full Thickness With Exposed Support Structures Wound Margin: Flat and Intact Exudate  Amount: Large Exudate Type: Serous Exudate Color: amber Foul Odor After Cleansing: No Slough/Fibrino Yes Wound Bed Granulation Amount: Small (1-33%) Exposed Structure Necrotic Amount: Large (67-100%) Fascia Exposed: No Necrotic Quality: Adherent Slough Fat Layer (Subcutaneous Tissue) Exposed: Yes Tendon Exposed: Yes Muscle Exposed: Yes Necrosis of Muscle: No Joint Exposed: No Bone Exposed: No Treatment Notes Wound #1 (Ankle) Wound Laterality: Left, Lateral Cleanser Soap and Water Discharge Instruction: Gently cleanse wound with antibacterial soap, rinse and pat dry prior to dressing wounds Peri-Wound Care Topical Primary Dressing Promogran Matrix 4.34 (in) Discharge Instruction: Moisten w/normal saline or sterile water; Cover wound  as directed. Do not remove from wound bed. Secondary Dressing ABD Pad 5x9 (in/in) Discharge Instruction: Cover with ABD pad Secured With Compression Wrap [name], [name]. ([id number]) Profore Lite LF 3 Multilayer Compression Bandaging System Discharge Instruction: Apply 3 multi-layer wrap as prescribed. Compression Stockings Add-Ons Electronic Signature(s) Signed: 03/06/2021 12:52:22 PM By: Donnamarie Poag Entered ByDonnamarie Poag on 03/06/2021 10:51:43

## 2021-03-07 NOTE — Progress Notes (Signed)
[name], [name] ([id number]) Visit Report for 03/06/2021 Physician Orders Details Patient Name: [name], [name]. Date of Service: 03/06/2021 10:45 AM Medical Record Number: [id number] Patient Account Number: [id number] Date of Birth/Sex: 09-28-1940 (81 y.o. M) Treating RN: Donnamarie Poag Primary Care Provider: Elsie Stain Other Clinician: Referring Provider: Elsie Stain Treating Provider/Extender: Skipper Cliche in Treatment: 1 Verbal / Phone Orders: No Diagnosis Coding Follow-up Appointments Wound #1 Left,Lateral Ankle o Return Appointment in 1 week. o Nurse Visit as needed Bathing/ Shower/ Hygiene Wound #1 Left,Lateral Ankle o May shower with wound dressing protected with water repellent cover or cast protector. Anesthetic (Use 'Patient Medications' Section for Anesthetic Order Entry) o Lidocaine applied to wound bed Edema Control - Lymphedema / Segmental Compressive Device / Other o Elevate, Exercise Daily and Avoid Standing for Long Periods of Time. o Elevate legs to the level of the heart and pump ankles as often as possible o Elevate leg(s) parallel to the floor when sitting. Wound Treatment Wound #1 - Ankle Wound Laterality: Left, Lateral Cleanser: Soap and Water 1 x Per Week/15 Days Discharge Instructions: Gently cleanse wound with antibacterial soap, rinse and pat dry prior to dressing wounds Primary Dressing: Promogran Matrix 4.34 (in) 1 x Per Week/15 Days Discharge Instructions: Moisten w/normal saline or sterile water; Cover wound as directed. Do not remove from wound bed. Secondary Dressing: ABD Pad 5x9 (in/in) 1 x Per Week/15 Days Discharge Instructions: Cover with ABD pad Compression Wrap: Profore Lite LF 3 Multilayer Compression Bandaging System 1 x Per Week/15 Days Discharge Instructions: Apply 3 multi-layer wrap as prescribed. Electronic Signature(s) Signed: 03/06/2021 12:52:22 PM By: Donnamarie Poag Signed: 03/07/2021 4:48:07 PM By: Worthy Keeler  PA-C Entered By: Donnamarie Poag on 03/06/2021 10:54:26 [name], [name] ([id number]) -------------------------------------------------------------------------------- [place] Details Patient Name: [name], [name]. Date of Service: 03/06/2021 Medical Record Number: [id number] Patient Account Number: [id number] Date of Birth/Sex: [birth year] (81 y.o. 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

## 2021-03-13 ENCOUNTER — Encounter: Payer: Medicare PPO | Admitting: Physician Assistant

## 2021-03-13 ENCOUNTER — Other Ambulatory Visit: Payer: Self-pay

## 2021-03-13 DIAGNOSIS — Z856 Personal history of leukemia: Secondary | ICD-10-CM | POA: Diagnosis not present

## 2021-03-13 DIAGNOSIS — L97422 Non-pressure chronic ulcer of left heel and midfoot with fat layer exposed: Secondary | ICD-10-CM | POA: Diagnosis not present

## 2021-03-13 DIAGNOSIS — G9009 Other idiopathic peripheral autonomic neuropathy: Secondary | ICD-10-CM | POA: Diagnosis not present

## 2021-03-13 DIAGNOSIS — L97325 Non-pressure chronic ulcer of left ankle with muscle involvement without evidence of necrosis: Secondary | ICD-10-CM | POA: Diagnosis not present

## 2021-03-13 NOTE — Progress Notes (Addendum)
MARCKUS, HANOVER ([id number]) Visit Report for 03/13/2021 Chief Complaint Document Details Patient Name: [name], [name]. Date of Service: 03/13/2021 9:30 AM Medical Record Number: [id number] Patient Account Number: [id number] Date of Birth/Sex: [birth year] (81 y.o. M) Treating RN: Cornell Barman Primary Care Provider: Elsie Stain Other Clinician: Referring Provider: Elsie Stain Treating Provider/Extender: Skipper Cliche in Treatment: 2 Information Obtained from: Patient Chief Complaint Left LE Ulcer Electronic Signature(s) Signed: 03/13/2021 10:15:16 AM By: Worthy Keeler PA-C Entered By: Worthy Keeler on 03/13/2021 10:15:16 [name], [name] ([id number]) -------------------------------------------------------------------------------- HPI Details Patient Name: [name], [name]. Date of Service: 03/13/2021 9:30 AM Medical Record Number: [id number] Patient Account Number: [id number] Date of Birth/Sex: 1940-06-08 (81 y.o. M) Treating RN: Cornell Barman Primary Care Provider: Elsie Stain Other Clinician: Referring Provider: Elsie Stain Treating Provider/Extender: Skipper Cliche in Treatment: 2 History of Present Illness HPI Description: 02/27/2021 upon evaluation today patient presents for initial evaluation here in the clinic sent concerning an issue on the left lateral heel which has been present currently for about 3 weeks that he notes. With that being said he did have an issue where he hit this area on a lawnmower over the summer 2022. Nonetheless the patient tells me that unfortunately though it did not seem to give him a lot of trouble immediately following this reopened more recently. He has not had any x-rays up to this time. 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 He does not have any other major medical problems such as high blood pressure otherwise and again does not have diabetes though he does have significant neuropathy  secondary to his other chronic conditions. Subsequently he did have a fairly good arterial test today here in the clinic measured at 1.03 but I am somewhat cautious with that due to the fact that his extremity is very cold nonetheless he seems to have pretty good blood flow of the Michael when I am cleaning this today. We will keep an eye on this and if I need to send him for formal arterial testing I will do so. 03/13/2021 upon evaluation today patient appears to be doing well with regard to his Michael. He has been tolerating the dressing changes without complication. Fortunately he does not show any signs of active infection at this time which is great news. No fevers, chills, nausea, vomiting, or diarrhea. With that being said his x-ray was negative for any signs of obvious osteomyelitis obviously an x-ray is not the best test for this but an MRI would be. Nonetheless I do not think that we need to jump straight to that at this point. Electronic Signature(s) Signed: 03/13/2021 10:23:36 AM By: Worthy Keeler PA-C Entered By: Worthy Keeler on 03/13/2021 10:23:36 [name], [name] ([id number]) -------------------------------------------------------------------------------- Physical Exam Details Patient Name: AMEAR, STROJNY. Date of Service: 03/13/2021 9:30 AM Medical Record Number: [id number] Patient Account Number: [id number] Date of Birth/Sex: [birth year] (81 y.o. M) Treating RN: Cornell Barman Primary Care Provider: Elsie Stain Other Clinician: Referring Provider: Elsie Stain Treating Provider/Extender: Jeri Cos Weeks in Treatment: 2 Constitutional Well-nourished and well-hydrated in no acute distress. Respiratory normal breathing without difficulty. Psychiatric this patient is able to make decisions and demonstrates good insight into disease process. Alert and Oriented x 3. pleasant and cooperative. Notes Upon inspection patient's Michael bed actually showed signs of good granulation and  epithelization at this point. Fortunately I do not see any evidence of active infection locally  nor systemically at this time which is great news. No fevers, chills, nausea, vomiting, or diarrhea. Electronic Signature(s) Signed: 03/13/2021 10:23:53 AM By: Worthy Keeler PA-C Entered By: Worthy Keeler on 03/13/2021 10:23:53 BROK, STOCKING ([id number]) -------------------------------------------------------------------------------- Physician Orders Details Patient Name: [name], [name]. Date of Service: 03/13/2021 9:30 AM Medical Record Number: [id number] Patient Account Number: [id number] Date of Birth/Sex: 09/06/1940 (81 y.o. 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 o Nurse Visit as needed Bathing/ Shower/ Hygiene Michael #1 Left,Lateral Morgan o May shower with Michael dressing protected with water repellent cover or cast protector. Anesthetic (Use 'Patient Medications' Section for Anesthetic Order Entry) o Lidocaine applied to Michael bed Edema Control - Lymphedema / Segmental Compressive Device / Other o Elevate, Exercise Daily and Avoid Standing for Long Periods of Time. o Elevate legs to the level of the heart and pump ankles as often as possible o Elevate leg(s) parallel to the floor when sitting. Michael Treatment Michael #1 - Morgan Michael Laterality: Left, Lateral Cleanser: Soap and Water 1 x Per Week/15 Days Discharge Instructions: Gently cleanse Michael with antibacterial soap, rinse and pat dry prior to  dressing wounds Primary Dressing: Prisma 4.34 (in) 1 x Per Week/15 Days Discharge Instructions: Moisten w/normal saline or sterile water; Cover Michael as directed. Do not remove from Michael bed. Secondary Dressing: Gauze 1 x Per Week/15 Days Discharge Instructions: As directed: dry, moistened with saline or moistened with Dakins Solution Compression Wrap: Profore Lite LF 3 Multilayer Compression Bandaging System 1 x Per Week/15 Days Discharge Instructions: Apply 3 multi-layer wrap as prescribed. Notes Epifix or Apligraf VOB Electronic Signature(s) Signed: 03/13/2021 4:20:44 PM By: Gretta Cool, BSN, RN, CWS, Kim RN, BSN Signed: 03/13/2021 5:14:54 PM By: Worthy Keeler PA-C Entered By: Gretta Cool BSN, RN, CWS, Kim on 03/13/2021 10:21:19 [name], [name] ([id number]) -------------------------------------------------------------------------------- Problem List Details Patient Name: MAXDEN, NAJI. Date of Service: 03/13/2021 9:30 AM Medical Record Number: [id number] Patient Account Number: [id number] Date of Birth/Sex: [birth year] (81 y.o. 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 Date of Service: 03/13/2021 9:30 AM Medical Record Number:  [id number] Patient Account Number: [id number] Date of Birth/Sex: 08-31-1940 (81 y.o. M) Treating RN: Cornell Barman Primary Care Provider: Elsie Stain Other Clinician: Referring Provider: Elsie Stain Treating Provider/Extender: Skipper Cliche in Treatment: 2 Subjective Chief Complaint Information obtained from Patient Left LE Ulcer History of Present Illness (HPI) 02/27/2021 upon evaluation today patient presents for initial evaluation here in the clinic sent concerning an issue on the left lateral heel which has been present currently for about 3 weeks  that he notes. With that being said he did have an issue where he hit this area on a lawnmower over the summer 2022. Nonetheless the patient tells me that unfortunately though it did not seem to give him a lot of trouble immediately following this reopened more recently. He has not had any x-rays up to this time. 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 Subsequently he did have a fairly good arterial test today here in the clinic measured at 1.03 but I am somewhat cautious with that due to the fact that his extremity is very cold nonetheless he seems to have pretty good blood flow of the Michael when I am cleaning this today. We will keep an eye on this and if I need to send him for formal arterial testing I will do so. 03/13/2021 upon evaluation today patient appears to be doing well with regard to his Michael. He has been tolerating the dressing changes without complication. Fortunately he does not show any signs of active infection at this time which is great news. No fevers, chills, nausea, vomiting, or diarrhea. With that being said his x-ray was negative for any signs of obvious osteomyelitis  obviously an x-ray is not the best test for this but an MRI would be. Nonetheless I do not think that we need to jump straight to that at this point. Objective Constitutional Well-nourished and well-hydrated in no acute distress. Vitals Time Taken: 10:03 AM, Height: 76 in, Weight: 200 lbs, BMI: 24.3, Temperature: 98.3 F, Pulse: 63 bpm, Respiratory Rate: 16 breaths/min, Blood Pressure: 105/62 mmHg. Respiratory normal breathing without difficulty. Psychiatric this patient is able to make decisions and demonstrates good insight into disease process. Alert and Oriented x 3. pleasant and cooperative. General Notes: Upon inspection patient's Michael bed actually showed signs of good granulation and epithelization at this point. Fortunately I do not see any evidence of active infection locally nor systemically at this time which is great news. No fevers, chills, nausea, vomiting, or diarrhea. Integumentary (Hair, Skin) Michael #1 status is Open. Original cause of Michael was Trauma. The date acquired was: 02/07/2021. The Michael has been in treatment 2 weeks. The Michael is located on the Left,Lateral Morgan. The Michael measures 1.4cm length x 1.5cm width x 0.5cm depth; 1.649cm^2 area and 0.825cm^3 volume. There is muscle, tendon, and Fat Layer (Subcutaneous Tissue) exposed. There is undermining starting at 6:00 and ending at 11:00 with a maximum distance of 0.3cm. There is a large amount of serous drainage noted. The Michael margin is flat and intact. There is small (1-33%) granulation within the Michael bed. There is a large (67-100%) amount of necrotic tissue within the Michael bed including Adherent Slough. 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 There was a Three Layer Compression Therapy Procedure with a pre-treatment ABI of 1 by Cornell Barman, RN. Post procedure Diagnosis Michael #1: Same as Pre-Procedure Plan Follow-up Appointments: Michael #1 Left,Lateral Morgan: Return Appointment in 1 week. Nurse Visit as needed Bathing/ Shower/ Hygiene: Michael #1  Left,Lateral Morgan: May shower with Michael dressing protected with water repellent cover or cast protector. Anesthetic (Use 'Patient Medications' Section for Anesthetic Order Entry): Lidocaine applied to Michael bed Edema Control - Lymphedema / Segmental Compressive Device / Other: Elevate, Exercise Daily and Avoid Standing for Long Periods of Time. Elevate legs to the level of the heart and pump ankles as often as possible Elevate leg(s) parallel to the floor when sitting. General Notes: Epifix or Apligraf VOB [name] Michael Laterality: Left, Lateral Cleanser: Soap and Water 1 x Per Week/15 Days Discharge Instructions: Gently cleanse Michael with antibacterial soap, rinse and pat dry prior to dressing wounds Primary Dressing: Prisma 4.34 (in) 1 x Per Week/15 Days Discharge Instructions: Moisten w/normal saline or sterile water; Cover Michael as directed. Do not remove from Michael bed. Secondary Dressing: Gauze 1 x Per Week/15 Days Discharge Instructions: As directed: dry, moistened with saline or moistened with Dakins Solution Compression Wrap: Profore Lite LF 3 Multilayer Compression Bandaging System 1 x Per Week/15 Days Discharge Instructions: Apply 3 multi-layer wrap as prescribed. 1. Would recommend currently that we continue with the silver collagen which I think is a good option here. 2. Also can recommend that we have the patient continue with a 3 layer compression wrap which I think should do quite well for him. 3. I would also suggest that the patient continue to monitor for any signs of worsening or infection if anything changes  such as increased pain he should let me know soon as possible. We will see patient back for reevaluation in 1 week here in the clinic. If anything worsens or changes patient will contact our office for additional recommendations. Electronic Signature(s) Signed: 03/13/2021 10:24:50 AM By: Worthy Keeler PA-C Entered By: Worthy Keeler on 03/13/2021 10:24:50 [name], [name] ([id number]) -------------------------------------------------------------------------------- [place] Details Patient Name: PRANEETH, BUSSEY. Date of Service: 03/13/2021 Medical Record Number: [id number] Patient Account Number: [id number] Date of Birth/Sex: October 22, 1940 (81 y.o. 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 of leukemia Facility Procedures CPT4 Code: 98338250 Description: (Facility Use Only) [phone] - [place] [facility] LT LEG Modifier: Quantity: 1 Physician Procedures CPT4 Code: 4193790 Description: 24097 - WC PHYS LEVEL 3 - EST PT Modifier: Quantity: 1 CPT4 Code: Description: ICD-10 Diagnosis Description 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

## 2021-03-13 NOTE — Progress Notes (Signed)
ZAKHAI, MEISINGER ([id number]) Visit Report for 03/13/2021 Arrival Information Details Patient Name: [name], [name]. Date of Service: 03/13/2021 9:30 AM Medical Record Number: [id number] Patient Account Number: [id number] Date of Birth/Sex: 10/22/1940 (81 y.o. M) Treating RN: Cornell Barman Primary Care Willard Madrigal: Elsie Stain Other Clinician: Referring Maly Lemarr: Elsie Stain Treating Louanna Vanliew/Extender: Skipper Cliche in Treatment: 2 Visit Information History Since Last Visit Added or deleted any medications: No Patient Arrived: Wheel Chair Has Dressing in Place as Prescribed: Yes Arrival Time: 10:01 Pain Present Now: No Accompanied By: son Transfer Assistance: None Patient Identification Verified: Yes Secondary Verification Process Completed: Yes Patient Has Alerts: Yes Patient Alerts: Patient on Blood Thinner 81MG  x 2 NOT DIABETIC Electronic Signature(s) Signed: 03/13/2021 4:20:44 PM By: Gretta Cool, BSN, RN, CWS, Kim RN, BSN Entered By: Gretta Cool, BSN, RN, CWS, Kim on 03/13/2021 10:03:07 [name] ([id number]) -------------------------------------------------------------------------------- Compression Therapy Details Patient Name: [name]. Date of Service: 03/13/2021 9:30 AM Medical Record Number: [id number] Patient Account Number: [id number] Date of Birth/Sex: 1940-12-26 (81 y.o. M) Treating RN: Cornell Barman Primary Care Baylynn Shifflett: Elsie Stain Other Clinician: Referring Dorethia Jeanmarie: Elsie Stain Treating Tayvien Kane/Extender: Jeri Cos Weeks in Treatment: 2 Compression Therapy Performed for Wound Assessment: Wound #1 Left,Lateral Ankle Performed By: Clinician Cornell Barman, RN Compression Type: Three Layer Pre Treatment ABI: 1 Post Procedure Diagnosis Same as Pre-procedure Electronic Signature(s) Signed: 03/13/2021 4:20:44 PM By: Gretta Cool, BSN, RN, CWS, Kim RN, BSN Entered By: Gretta Cool, BSN, RN, CWS, Kim on 03/13/2021 10:19:30 [name]  ([id number]) -------------------------------------------------------------------------------- Encounter Discharge Information Details Patient Name: [name]. Date of Service: 03/13/2021 9:30 AM Medical Record Number: [id number] Patient Account Number: [id number] Date of Birth/Sex: 1940/06/28 (81 y.o. M) Treating RN: Cornell Barman Primary Care Wyley Hack: Elsie Stain Other Clinician: Referring Mandy Fitzwater: Elsie Stain Treating Omran Keelin/Extender: Skipper Cliche in Treatment: 2 Encounter Discharge Information Items Discharge Condition: Stable Ambulatory Status: Wheelchair Discharge Destination: Home Transportation: Private Auto Accompanied By: son Schedule Follow-up Appointment: Yes Clinical Summary of Care: Electronic Signature(s) Signed: 03/13/2021 4:20:44 PM By: Gretta Cool, BSN, RN, CWS, Kim RN, BSN Entered By: Gretta Cool, BSN, RN, CWS, Kim on 03/13/2021 10:23:46 [name] ([id number]) -------------------------------------------------------------------------------- Lower Extremity Assessment Details Patient Name: TYVION, [name]. Date of Service: 03/13/2021 9:30 AM Medical Record Number: [id number] Patient Account Number: [id number] Date of Birth/Sex: [birth year] (81 y.o. M) Treating RN: Cornell Barman Primary Care Chiquitta Matty: Elsie Stain Other Clinician: Referring Deunta Beneke: Elsie Stain Treating Humbert Morozov/Extender: Jeri Cos Weeks in Treatment: 2 Edema Assessment Assessed: [Left: No] [Right: No] [Left: Edema] [Right: :] Calf Left: Right: Point of Measurement: 36 cm From Medial Instep 35 cm Ankle Left: Right: Point of Measurement: 15 cm From Medial Instep 35 cm Vascular Assessment Pulses: Dorsalis Pedis Palpable: [Left:Yes] Electronic Signature(s) Signed: 03/13/2021 4:20:44 PM By: Gretta Cool, BSN, RN, CWS, Kim RN, BSN Entered By: Gretta Cool, BSN, RN, CWS, Kim on 03/13/2021 10:17:47 [name]  ([id number]) -------------------------------------------------------------------------------- Multi Wound Chart Details Patient Name: [name]. Date of Service: 03/13/2021 9:30 AM Medical Record Number: [id number] Patient Account Number: [id number] Date of Birth/Sex: 10-Jan-1941 (81 y.o. 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] [N/A:N/A] Comorbid History: [1:Cataracts, Neuropathy] [N/A:N/A] Date Acquired: [1:02/07/2021] [N/A:N/A] Weeks  of Treatment: [1:2] [N/A:N/A] Wound Status: [1:Open] [N/A:N/A] Wound Recurrence: [1:No] [N/A:N/A] Measurements L x W x D (cm) [1:1.4x1.5x0.5] [N/A:N/A] Area (cm) : [1:1.649] [N/A:N/A] Volume (cm) : [1:0.825] [N/A:N/A] % Reduction in Area: [1:2.80%] [N/A:N/A] % Reduction in Volume: [1:2.70%] [N/A:N/A] Starting Position 1 (o'clock): [1:6] Ending Position 1 (o'clock): [1:11] Maximum Distance 1 (cm): [1:0.3] Undermining: [1:Yes] [N/A:N/A] Classification: [1:Full Thickness With Exposed Support Structures] [N/A:N/A] Exudate Amount: [1:Large] [N/A:N/A] Exudate Type: [1:Serous] [N/A:N/A] Exudate Color: [1:amber] [N/A:N/A] Wound Margin: [1:Flat and Intact] [N/A:N/A] Granulation Amount: [1:Small (1-33%)] [N/A:N/A] Necrotic Amount: [1:Large (67-100%)] [N/A:N/A] Exposed Structures: [1:Fat Layer (Subcutaneous Tissue): Yes Tendon: Yes Muscle: Yes Fascia: No Joint: No Bone: No] [N/A:N/A] Treatment Notes Electronic Signature(s) Signed: 03/13/2021 4:20:44 PM By: Gretta Cool, BSN, RN,  CWS, Kim RN, BSN Entered By: Gretta Cool, BSN, RN, CWS, Kim on 03/13/2021 10:18:22 [name] ([id number]) -------------------------------------------------------------------------------- [place] Details Patient Name: [name]. Date of Service: 03/13/2021 9:30 AM Medical Record Number: [id number] Patient Account Number: [id number] Date of Birth/Sex: 16-Jun-1940 (82 y.o. M) Treating RN: Cornell Barman Primary Care Stancil Deisher: Elsie Stain Other Clinician: Referring Denver Harder: Elsie Stain Treating Gaege Sangalang/Extender: Skipper Cliche in Treatment: 2 Active Inactive Necrotic Tissue Nursing Diagnoses: Impaired tissue integrity related to necrotic/devitalized tissue Knowledge deficit related to management of necrotic/devitalized tissue Goals: Necrotic/devitalized tissue will be minimized in the wound bed Date Initiated: 02/27/2021 Target Resolution Date: 02/27/2021 Goal Status: Active Patient/caregiver will verbalize understanding of reason and process for debridement of necrotic tissue Date Initiated: 02/27/2021 Target Resolution Date: 02/27/2021 Goal Status: Active Interventions: Assess patient pain level pre-, during and post procedure and prior to discharge Provide education on necrotic tissue and debridement process Treatment Activities: Excisional debridement : 02/27/2021 Notes: Orientation to the Wound Care Program Nursing Diagnoses: Knowledge deficit related to the wound healing center program Goals: Patient/caregiver will verbalize understanding of the [place] [facility] Date Initiated: 02/27/2021 Target Resolution Date: 02/27/2021 Goal Status: Active Interventions: Provide education on orientation to the wound center Notes: Peripheral Neuropathy Nursing Diagnoses: Knowledge deficit related to disease process and management of peripheral neurovascular dysfunction Potential alteration in peripheral tissue perfusion (select prior to  confirmation of diagnosis) Goals: Patient/caregiver will verbalize understanding of disease process and disease management Date Initiated: 02/27/2021 Target Resolution Date: 02/27/2021 Goal Status: Active Interventions: Assess signs and symptoms of neuropathy upon admission and as needed Provide education on Management of Neuropathy and Related Ulcers Treatment Activities: NATHANEL, TALLMAN ([id number]) Patient referred for customized footwear/offloading : 02/27/2021 Notes: Wound/Skin Impairment Nursing Diagnoses: Impaired tissue integrity Knowledge deficit related to smoking impact on wound healing Knowledge deficit related to ulceration/compromised skin integrity Goals: Patient/caregiver will verbalize understanding of skin care regimen Date Initiated: 02/27/2021 Target Resolution Date: 02/27/2021 Goal Status: Active Ulcer/skin breakdown will have a volume reduction of 30% by week 4 Date Initiated: 02/27/2021 Target Resolution Date: 03/27/2021 Goal Status: Active Ulcer/skin breakdown will have a volume reduction of 50% by week 8 Date Initiated: 02/27/2021 Target Resolution Date: 04/24/2021 Goal Status: Active Interventions: Assess ulceration(s) every visit Treatment Activities: Skin care regimen initiated : 02/27/2021 Topical wound management initiated : 02/27/2021 Notes: Electronic Signature(s) Signed: 03/13/2021 4:20:44 PM By: Gretta Cool, BSN, RN, CWS, Kim RN, BSN Entered By: Gretta Cool, BSN, RN, CWS, Kim on 03/13/2021 10:18:07 [name] ([id number]) -------------------------------------------------------------------------------- Pain Assessment Details Patient Name: SAATHVIK, EVERY. Date of Service: 03/13/2021 9:30 AM Medical Record Number: [id number] Patient Account Number: [id number] Date of Birth/Sex: 1940/09/30 (81 y.o. M) Treating RN: Cornell Barman Primary Care Tyton Abdallah: Elsie Stain Other  Clinician: Referring Harkirat Orozco: Elsie Stain Treating Airen Stiehl/Extender: Skipper Cliche in Treatment: 2 Active Problems Location of Pain Severity and Description of Pain Patient Has Paino No Site Locations Pain Management and Medication Current Pain Management: Notes Patient has neuropathy and cannot feel pain in that area. Electronic Signature(s) Signed: 03/13/2021 4:20:44 PM By: Gretta Cool, BSN, RN, CWS, Kim RN, BSN Entered By: Gretta Cool, BSN, RN, CWS, Kim on 03/13/2021 10:04:09 [name] ([id number]) -------------------------------------------------------------------------------- Patient/Caregiver Education Details Patient Name: AARIV, MEDLOCK. Date of Service: 03/13/2021 9:30 AM Medical Record Number: [id number] Patient Account Number: [id number] Date of Birth/Gender: May 01, 1940 (81 y.o. M) Treating RN: Cornell Barman Primary Care Physician: Elsie Stain Other Clinician: Referring Physician: Elsie Stain Treating Physician/Extender: Skipper Cliche in Treatment: 2 Education Assessment Education Provided To: Patient Education Topics Provided Venous: Handouts: Controlling Swelling with Multilayered Compression Wraps Methods: Demonstration, Explain/Verbal Responses: State content correctly Electronic Signature(s) Signed: 03/13/2021 4:20:44 PM By: Gretta Cool, BSN, RN, CWS, Kim RN, BSN Entered By: Gretta Cool, BSN, RN, CWS, Kim on 03/13/2021 10:23:06 [name] ([id number]) -------------------------------------------------------------------------------- Wound Assessment Details Patient Name: JERMALE, CRASS. Date of Service: 03/13/2021 9:30 AM Medical Record Number: [id number] Patient Account Number: [id number] Date of Birth/Sex: 1941-01-09 (81 y.o. M) Treating RN: Cornell Barman Primary Care Barnes Florek: Elsie Stain Other Clinician: Referring Mayah Urquidi: Elsie Stain Treating Loria Lacina/Extender: Jeri Cos Weeks in Treatment: 2 Wound Status Wound Number: 1 Primary Etiology: Neuropathic Ulcer-Non Diabetic Wound Location: Left, Lateral Ankle Wound Status:  Open Wounding Event: Trauma Comorbid History: Cataracts, Neuropathy Date Acquired: 02/07/2021 Weeks Of Treatment: 2 Clustered Wound: No Photos Photo Uploaded By: Gretta Cool, BSN, RN, CWS, Kim on 03/13/2021 11:19:46 Wound Measurements Length: (cm) 1.4 Width: (cm) 1.5 Depth: (cm) 0.5 Area: (cm) 1.649 Volume: (cm) 0.825 % Reduction in Area: 2.8% % Reduction in Volume: 2.7% Undermining: Yes Starting Position (o'clock): 6 Ending Position (o'clock): 11 Maximum Distance: (cm) 0.3 Wound Description Classification: Full Thickness With Exposed Support Structures Wound Margin: Flat and Intact Exudate Amount: Large Exudate Type: Serous Exudate Color: amber Foul Odor After Cleansing: No Slough/Fibrino Yes Wound Bed Granulation Amount: Small (1-33%) Exposed Structure Necrotic Amount: Large (67-100%) Fascia Exposed: No Necrotic Quality: Adherent Slough Fat Layer (Subcutaneous Tissue) Exposed: Yes Tendon Exposed: Yes Muscle Exposed: Yes Necrosis of Muscle: No Joint Exposed: No Bone Exposed: No Treatment Notes Wound #1 (Ankle) Wound Laterality: Left, Lateral Cleanser TYCEN, DOCKTER ([id number]) Soap and Water Discharge Instruction: Gently cleanse wound with antibacterial soap, rinse and pat dry prior to dressing wounds Peri-Wound Care Topical Primary Dressing Prisma 4.34 (in) Discharge Instruction: Moisten w/normal saline or sterile water; Cover wound as directed. Do not remove from wound bed. Secondary Dressing Gauze Discharge Instruction: As directed: dry, moistened with saline or moistened with Dakins Solution Secured With Compression Wrap Profore Lite LF 3 Multilayer Compression Bandaging System Discharge Instruction: Apply 3 multi-layer wrap as prescribed. Compression Stockings Environmental education officer) Signed: 03/13/2021 4:20:44 PM By: Gretta Cool, BSN, RN, CWS, Kim RN, BSN Entered By: Gretta Cool, BSN, RN, CWS, Kim on 03/13/2021 10:10:26 [name]  ([id number]) -------------------------------------------------------------------------------- [place] Details Patient Name: YONATHAN, PERROW. Date of Service: 03/13/2021 9:30 AM Medical Record Number: [id number] Patient Account Number: [id number] Date of Birth/Sex: 02-Aug-1940 (81 y.o. M) Treating RN: Cornell Barman Primary Care Shanyah Gattuso: Elsie Stain Other Clinician: Referring Anelisse Jacobson: Elsie Stain Treating Mehki Klumpp/Extender: Skipper Cliche in Treatment: 2 Vital Signs Time Taken: 10:03 Temperature (F): 98.3 Height (in): 76 Pulse (bpm): 63 Weight (lbs): 200 Respiratory Rate (breaths/min): 16 Body Mass Index (BMI): 24.3 Blood  Pressure (mmHg): 105/62 Reference Range: 80 - 120 mg / dl Electronic Signature(s) Signed: 03/13/2021 4:20:44 PM By: Gretta Cool, BSN, RN, CWS, Kim RN, BSN Entered By: Gretta Cool, BSN, RN, CWS, Kim on 03/13/2021 10:03:29

## 2021-03-20 ENCOUNTER — Other Ambulatory Visit: Payer: Self-pay

## 2021-03-20 DIAGNOSIS — L97422 Non-pressure chronic ulcer of left heel and midfoot with fat layer exposed: Secondary | ICD-10-CM | POA: Diagnosis not present

## 2021-03-20 DIAGNOSIS — G9009 Other idiopathic peripheral autonomic neuropathy: Secondary | ICD-10-CM | POA: Diagnosis not present

## 2021-03-20 DIAGNOSIS — Z856 Personal history of leukemia: Secondary | ICD-10-CM | POA: Diagnosis not present

## 2021-03-20 NOTE — Progress Notes (Signed)
CARVER, MURAKAMI ([id number]) Visit Report for 03/20/2021 Arrival Information Details Patient Name: [name], [name]. Date of Service: 03/20/2021 3:15 PM Medical Record Number: [id number] Patient Account Number: [id number] Date of Birth/Sex: Jul 08, 1940 (81 y.o. M) Treating RN: Donnamarie Poag Primary Care Eppie Barhorst: Elsie Stain Other Clinician: Referring Seema Blum: Elsie Stain Treating Werner Labella/Extender: Skipper Cliche in Treatment: 3 Visit Information History Since Last Visit Added or deleted any medications: No Patient Arrived: Wheel Chair Had a fall or experienced change in No Arrival Time: 14:30 activities of daily living that may affect Accompanied By: daughter risk of falls: Transfer Assistance: None Hospitalized since last visit: No Patient Identification Verified: Yes Has Dressing in Place as Prescribed: Yes Secondary Verification Process Completed: Yes Pain Present Now: No Patient Requires Transmission-Based No Precautions: Patient Has Alerts: Yes Patient Alerts: Patient on Blood Thinner 81MG  x 2 NOT DIABETIC Electronic Signature(s) Signed: 03/20/2021 3:56:17 PM By: Donnamarie Poag Entered By: Donnamarie Poag on 03/20/2021 15:28:13 [name] ([id number]) -------------------------------------------------------------------------------- Compression Therapy Details Patient Name: [name]. Date of Service: 03/20/2021 3:15 PM Medical Record Number: [id number] Patient Account Number: [id number] Date of Birth/Sex: [birth year] (81 y.o. M) Treating RN: Donnamarie Poag Primary Care Cristina Mattern: Elsie Stain Other Clinician: Referring Undray Allman: Elsie Stain Treating Aadhira Heffernan/Extender: Skipper Cliche in Treatment: 3 Compression Therapy Performed for Wound Assessment: Wound #1 Left,Lateral Ankle Performed By: Clinician Donnamarie Poag, RN Compression Type: Three Layer Electronic Signature(s) Signed: 03/20/2021 3:56:17 PM By: Donnamarie Poag Entered By: Donnamarie Poag on  03/20/2021 15:29:00 [name] ([id number]) -------------------------------------------------------------------------------- Encounter Discharge Information Details Patient Name: [name]. Date of Service: 03/20/2021 3:15 PM Medical Record Number: [id number] Patient Account Number: [id number] Date of Birth/Sex: Feb 21, 1940 (81 y.o. M) Treating RN: Donnamarie Poag Primary Care Elizbeth Posa: Elsie Stain Other Clinician: Referring Orris Perin: Elsie Stain Treating Annora Guderian/Extender: Skipper Cliche in Treatment: 3 Encounter Discharge Information Items Discharge Condition: Stable Ambulatory Status: Wheelchair Discharge Destination: Home Transportation: Private Auto Accompanied By: daughter Schedule Follow-up Appointment: Yes Clinical Summary of Care: Electronic Signature(s) Signed: 03/20/2021 3:56:17 PM By: Donnamarie Poag Entered By: Donnamarie Poag on 03/20/2021 15:30:43 [name] ([id number]) -------------------------------------------------------------------------------- Wound Assessment Details Patient Name: [name]. Date of Service: 03/20/2021 3:15 PM Medical Record Number: [id number] Patient Account Number: [id number] Date of Birth/Sex: [birth year] (81 y.o. M) Treating RN: Donnamarie Poag Primary Care Letrice Pollok: Elsie Stain Other Clinician: Referring Jaelie Aguilera: Elsie Stain Treating Kathaleen Dudziak/Extender: Jeri Cos Weeks in Treatment: 3 Wound Status Wound Number: 1 Primary Etiology: Neuropathic Ulcer-Non Diabetic Wound Location: Left, Lateral Ankle Wound Status: Open Wounding Event: Trauma Comorbid History: Cataracts, Neuropathy Date Acquired: 02/07/2021 Weeks Of Treatment: 3 Clustered Wound: No Wound Measurements Length: (cm) 1.4 Width: (cm) 1.5 Depth: (cm) 0.5 Area: (cm) 1.649 Volume: (cm) 0.825 % Reduction in Area: 2.8% % Reduction in Volume: 2.7% Wound Description Classification: Full Thickness With Exposed Support Structures Wound Margin: Flat  and Intact Exudate Amount: Large Exudate Type: Serous Exudate Color: amber Foul Odor After Cleansing: No Slough/Fibrino Yes Wound Bed Granulation Amount: Small (1-33%) Exposed Structure Necrotic Amount: Large (67-100%) Fascia Exposed: No Necrotic Quality: Adherent Slough Fat Layer (Subcutaneous Tissue) Exposed: Yes Tendon Exposed: Yes Muscle Exposed: Yes Necrosis of Muscle: No Joint Exposed: No Bone Exposed: No Treatment Notes Wound #1 (Ankle) Wound Laterality: Left, Lateral Cleanser Soap and Water Discharge Instruction: Gently cleanse wound with antibacterial soap, rinse and pat dry prior to dressing wounds Peri-Wound Care Topical Primary Dressing Prisma 4.34 (in) Discharge Instruction: Moisten w/normal saline or sterile water; Cover wound as directed. Do  not remove from wound bed. Secondary Dressing Zetuvit Plus Silicone Non-bordered 5x5 (in/in) Secured With Compression Wrap Profore Lite LF 3 Multilayer Compression Bandaging System HOSAM, MCFETRIDGE ([id number]) Discharge Instruction: Apply 3 multi-layer wrap as prescribed. Compression Stockings Add-Ons Electronic Signature(s) Signed: 03/20/2021 3:56:17 PM By: Donnamarie Poag Entered ByDonnamarie Poag on 03/20/2021 15:28:45

## 2021-03-21 NOTE — Progress Notes (Signed)
REYNARD, CHRISTOFFERSEN ([id number]) Visit Report for 03/20/2021 Physician Orders Details Patient Name: NATHANEAL, SOMMERS. Date of Service: 03/20/2021 3:15 PM Medical Record Number: [id number] Patient Account Number: [id number] Date of Birth/Sex: September 21, 1940 (81 y.o. M) Treating RN: Donnamarie Poag Primary Care Provider: Elsie Stain Other Clinician: Referring Provider: Elsie Stain Treating Provider/Extender: Skipper Cliche in Treatment: 3 Verbal / Phone Orders: No Diagnosis Coding Follow-up Appointments Wound #1 Left,Lateral Ankle o Return Appointment in 1 week. o Nurse Visit as needed Bathing/ Shower/ Hygiene Wound #1 Left,Lateral Ankle o May shower with wound dressing protected with water repellent cover or cast protector. Anesthetic (Use 'Patient Medications' Section for Anesthetic Order Entry) o Lidocaine applied to wound bed Edema Control - Lymphedema / Segmental Compressive Device / Other o Elevate, Exercise Daily and Avoid Standing for Long Periods of Time. o Elevate legs to the level of the heart and pump ankles as often as possible o Elevate leg(s) parallel to the floor when sitting. Wound Treatment Wound #1 - Ankle Wound Laterality: Left, Lateral Cleanser: Soap and Water 1 x Per Week/15 Days Discharge Instructions: Gently cleanse wound with antibacterial soap, rinse and pat dry prior to dressing wounds Primary Dressing: Prisma 4.34 (in) 1 x Per Week/15 Days Discharge Instructions: Moisten w/normal saline or sterile water; Cover wound as directed. Do not remove from wound bed. Secondary Dressing: Zetuvit Plus Silicone Non-bordered 5x5 (in/in) 1 x Per Week/15 Days Compression Wrap: Profore Lite LF 3 Multilayer Compression Bandaging System 1 x Per Week/15 Days Discharge Instructions: Apply 3 multi-layer wrap as prescribed. Electronic Signature(s) Signed: 03/20/2021 3:56:17 PM By: Donnamarie Poag Signed: 03/21/2021 2:37:32 PM By: Worthy Keeler PA-C Entered By: Donnamarie Poag on 03/20/2021 15:30:17 [name] ([id number]) -------------------------------------------------------------------------------- [place] Details Patient Name: ERMAN, THUM. Date of Service: 03/20/2021 Medical Record Number: [id number] Patient Account Number: [id number] Date of Birth/Sex: Aug 24, 1940 (81 y.o. 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

## 2021-03-27 ENCOUNTER — Encounter: Payer: Medicare PPO | Admitting: Physician Assistant

## 2021-03-27 ENCOUNTER — Other Ambulatory Visit: Payer: Self-pay

## 2021-03-27 DIAGNOSIS — Z856 Personal history of leukemia: Secondary | ICD-10-CM | POA: Diagnosis not present

## 2021-03-27 DIAGNOSIS — L97422 Non-pressure chronic ulcer of left heel and midfoot with fat layer exposed: Secondary | ICD-10-CM | POA: Diagnosis not present

## 2021-03-27 DIAGNOSIS — L97322 Non-pressure chronic ulcer of left ankle with fat layer exposed: Secondary | ICD-10-CM | POA: Diagnosis not present

## 2021-03-27 DIAGNOSIS — G9009 Other idiopathic peripheral autonomic neuropathy: Secondary | ICD-10-CM | POA: Diagnosis not present

## 2021-03-27 NOTE — Progress Notes (Addendum)
[name] ([id number]) Visit Report for 03/27/2021 Chief Complaint Document Details Patient Name: [name], [name]. Date of Service: 03/27/2021 2:30 PM Medical Record Number: [id number] Patient Account Number: [id number] Date of Birth/Sex: August 30, 1940 (81 y.o. M) Treating RN: [name] Primary Care Provider: Elsie Morgan Other Clinician: Referring Provider: Elsie Morgan Treating Provider/Extender: [name] in Treatment: 4 Information Obtained from: Patient Chief Complaint Left LE Ulcer Electronic Signature(s) Signed: 03/27/2021 2:42:52 PM By: Michael Keeler PA-C Entered By: [name] on 03/27/2021 14:42:52 [name], [name] ([id number]) -------------------------------------------------------------------------------- HPI Details Patient Name: [name], [name]. Date of Service: 03/27/2021 2:30 PM Medical Record Number: [id number] Patient Account Number: [id number] Date of Birth/Sex: 1940/09/26 (81 y.o. M) Treating RN: [name] Primary Care Provider: Elsie Morgan Other Clinician: Referring Provider: Elsie Morgan Treating Provider/Extender: [name] in Treatment: 4 History of Present Illness HPI Description: 02/27/2021 upon evaluation today patient presents for initial evaluation here in the clinic sent concerning an issue on the left lateral heel which has been present currently for about 3 weeks that he notes. With that being said he did have an issue where he hit this area on a lawnmower over the summer 2022. Nonetheless the patient tells me that unfortunately though it did not seem to give him a lot of trouble immediately following this reopened more recently. He has not had any x-rays up to this time. 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 He does not have any other major medical problems such as high blood pressure otherwise and again does not have diabetes though he does have significant  neuropathy secondary to his other chronic conditions. Subsequently he did have a fairly good arterial test today here in the clinic measured at 1.03 but I am somewhat cautious with that due to the fact that his extremity is very cold nonetheless he seems to have pretty good blood flow of the wound when I am cleaning this today. We will keep an eye on this and if I need to send him for formal arterial testing I will do so. 03/13/2021 upon evaluation today patient appears to be doing well with regard to his wound. He has been tolerating the dressing changes without complication. Fortunately he does not show any signs of active infection at this time which is great news. No fevers, chills, nausea, vomiting, or diarrhea. With that being said his x-ray was negative for any signs of obvious osteomyelitis obviously an x-ray is not the best test for this but an MRI would be. Nonetheless I do not think that we need to jump straight to that at this point. 03/27/2021 upon evaluation today patient actually appears to be doing extremely well in regard to his wounds. He has been tolerating the dressing changes on the left lateral ankle. I am actually extremely pleased with how this appears and I think that he is making excellent progress. Fortunately I do not see any evidence of active infection at this time which is great news. Electronic Signature(s) Signed: 03/27/2021 3:09:51 PM By: Michael Keeler PA-C Entered By: [name] on 03/27/2021 15:09:51 [name], [name] ([id number]) -------------------------------------------------------------------------------- Physical Exam Details Patient Name: [name]. Date of Service: 03/27/2021 2:30 PM Medical Record Number: [id number] Patient Account Number: [id number] Date of Birth/Sex: 1940/11/24 (81 y.o. M) Treating RN: [name] Primary Care Provider: Elsie Morgan Other Clinician: Referring Provider: Elsie Morgan Treating Provider/Extender: [name] Weeks in Treatment: 4  Constitutional Well-nourished and well-hydrated in no acute distress. Respiratory normal breathing without difficulty. Psychiatric this patient is able to make decisions and demonstrates good insight into disease process. Alert and Oriented x 3. pleasant and cooperative. Notes Patient's wound bed showed evidence of good granulation and epithelization at this point. I do feel like that the granulation tissue growth is amazing. From 2 weeks ago to now this is significantly improved and overall I feel like that we are definitely on the right track. I discussed this with the patient and his son today as well. Pretty soon I think the entire base of the wound is can be covered with new granular tissue growth. Electronic Signature(s) Signed: 03/27/2021 3:10:19 PM By: Michael Keeler PA-C Entered By: [name] on 03/27/2021 15:10:19 [name] ([id number]) -------------------------------------------------------------------------------- Physician Orders Details Patient Name: [name]. Date of Service: 03/27/2021 2:30 PM Medical Record Number: [id number] Patient Account Number: [id number] Date of Birth/Sex: Feb 18, 1940 (81 y.o. 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 o Nurse Visit as needed Bathing/ Shower/ Hygiene Wound #1 Left,Lateral Ankle o May shower with wound dressing protected with water repellent cover or cast protector. o No tub bath. Anesthetic (Use 'Patient  Medications' Section for Anesthetic Order Entry) o Lidocaine applied to wound bed Edema Control - Lymphedema / Segmental Compressive Device / Other o Elevate, Exercise Daily and Avoid Standing for Long Periods of Time. o Elevate legs to the level of the heart and pump ankles as often as possible o Elevate leg(s) parallel to the floor when sitting. Additional Orders / Instructions o Follow Nutritious Diet and Increase Protein Intake Wound Treatment Wound #1 - Ankle Wound Laterality: Left, Lateral Cleanser: Soap and Water 1 x Per Week/15 Days Discharge Instructions: Gently cleanse wound with antibacterial soap, rinse and pat dry prior to dressing wounds Primary Dressing: Prisma 4.34 (in) 1 x Per Week/15 Days Discharge Instructions: Moisten w/normal saline or sterile water; Cover wound as directed. Do not remove from wound bed. Secondary Dressing: Zetuvit Plus Silicone Non-bordered 5x5 (in/in) 1 x Per Week/15 Days Compression Wrap: Profore Lite LF 3 Multilayer Compression Bandaging System 1 x Per Week/15 Days Discharge Instructions: Apply 3 multi-layer wrap as prescribed. Electronic Signature(s) Signed: 03/27/2021 3:45:38 PM By: [name] Signed: 03/28/2021 4:05:24 PM By: Michael Keeler PA-C Entered By: [name] on 03/27/2021 15:04:39 [name], [name] ([id number]) -------------------------------------------------------------------------------- Problem List Details Patient Name: [name], [name]. Date of Service: 03/27/2021 2:30 PM Medical Record Number: [id number] Patient Account Number: [id number] Date of Birth/Sex: 05-12-1940 (81 y.o. 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 Date of Service: 03/27/2021 2:30 PM Medical Record Number: [id number] Patient Account Number: [id number] Date of Birth/Sex: 03/13/1940 (81 y.o. M) Treating RN: [name] Primary Care Provider: Elsie Morgan Other Clinician: Referring Provider: Elsie Morgan Treating Provider/Extender: [name] in Treatment: 4 Subjective Chief Complaint Information obtained from Patient Left LE Ulcer History of Present Illness (HPI) 02/27/2021 upon evaluation today patient presents for initial evaluation here in the clinic sent concerning an issue on the left lateral heel which has been present currently for about 3 weeks that he notes. With that being said he did have an issue where he hit this area on a lawnmower over the summer 2022. Nonetheless the patient tells me that unfortunately though it did not seem to give him a lot of trouble immediately following this reopened more recently. He has not had any x-rays up to this time. 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 Subsequently he did have a fairly good arterial test today here in the clinic measured at 1.03 but I am somewhat cautious with that due to the fact that his extremity is very cold nonetheless he seems to have  pretty good blood flow of the wound when I am cleaning this today. We will keep an eye on this and if I need to send him for formal arterial testing I will do so. 03/13/2021 upon evaluation today patient appears to be doing well with regard to his wound. He has been tolerating the dressing changes without complication. Fortunately he does not show any signs of active infection at this time which is great news. No fevers, chills, nausea, vomiting, or diarrhea. With that being said his x-ray was negative for any signs of obvious osteomyelitis obviously an x-ray is not the best test for this but an MRI would be. Nonetheless I do not think that we need to jump straight to that at this point. 03/27/2021 upon evaluation today patient actually appears to be doing extremely well in regard to his wounds. He has been tolerating the dressing changes on the left lateral ankle. I am actually extremely pleased with how this appears and I think that he is making excellent progress. Fortunately I do not see any evidence of active infection at this time which is great news. Objective Constitutional Well-nourished and well-hydrated in no acute distress. Vitals Time Taken: 2:45 PM, Height: 76 in, Weight: 200 lbs, BMI: 24.3, Temperature: 98.2 F, Pulse: 74 bpm, Respiratory Rate: 16 breaths/min, Blood Pressure: 122/69 mmHg. Respiratory normal breathing without difficulty. Psychiatric this patient is able to make decisions and demonstrates good insight into disease process. Alert and Oriented x 3. pleasant and cooperative. General Notes: Patient's wound bed showed evidence of good granulation and epithelization at this point. I do feel like that the granulation tissue growth is amazing. From 2 weeks ago to now this is significantly improved and overall I feel like that we are definitely on the right track. I discussed this with the patient and his son today as well. Pretty soon I think the entire base of the wound is  can be covered with new granular tissue growth. Integumentary (Hair, Skin) Wound #1 status is Open. Original cause of wound was Trauma. The date acquired was: 02/07/2021. The  wound has been in treatment 4 weeks. The wound is located on the Left,Lateral Ankle. The wound measures 1cm length x 1cm width x 0.4cm depth; 0.785cm^2 area and 0.314cm^3 volume. There is muscle, tendon, and Fat Layer (Subcutaneous Tissue) exposed. There is no tunneling noted, however, there is undermining starting at 7:00 and ending at 10:00 with a maximum distance of 0.4cm. There is a large amount of serous drainage noted. The wound margin is flat and intact. There is large (67-100%) red, hyper - granulation within the wound bed. There is a small (1-33%) amount of necrotic tissue within the wound bed including Adherent Slough. 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 There was a Three Layer Compression Therapy Procedure by Michael Poag, RN. Post procedure Diagnosis Wound #1: Same as Pre-Procedure Plan Follow-up Appointments: Wound #1 Left,Lateral Ankle: Return Appointment in 1 week. Nurse Visit as needed Bathing/ Shower/ Hygiene: Wound #1 Left,Lateral Ankle: May shower with wound dressing protected with water repellent cover or cast protector. No tub bath. Anesthetic (Use 'Patient Medications' Section for Anesthetic Order Entry): Lidocaine applied to wound bed Edema Control - Lymphedema / Segmental Compressive Device / Other: Elevate, Exercise Daily and Avoid Standing for Long Periods of Time. Elevate legs to the level of the heart and pump ankles as often as possible Elevate leg(s) parallel to the floor when  sitting. Additional Orders / Instructions: Follow Nutritious Diet and Increase Protein Intake WOUND #1: - Ankle Wound Laterality: Left, Lateral Cleanser: Soap and Water 1 x Per Week/15 Days Discharge Instructions: Gently cleanse wound with antibacterial soap, rinse and pat dry prior to dressing wounds Primary Dressing: Prisma 4.34 (in) 1 x Per Week/15 Days Discharge Instructions: Moisten w/normal saline or sterile water; Cover wound as directed. Do not remove from wound bed. Secondary Dressing: Zetuvit Plus Silicone Non-bordered 5x5 (in/in) 1 x Per Week/15 Days Compression Wrap: Profore Lite LF 3 Multilayer Compression Bandaging System 1 x Per Week/15 Days Discharge Instructions: Apply 3 multi-layer wrap as prescribed. 1. Would recommend currently that we going continue with the wound care measures as before the patient is in agreement with plan this includes the use of the silver collagen dressing which I think is doing a good job. 2. I am also can recommend that we continue with the Zetuvit dressing to cover. 3. We will also continue with a 3 layer compression wrap which I think is doing an awesome job. We will see patient back for reevaluation in 1 week here in the clinic. If anything worsens or changes patient will contact our office for additional recommendations. Electronic Signature(s) Signed: 03/27/2021 3:10:46 PM By: Michael Keeler PA-C Entered By: [name] on 03/27/2021 15:10:46 [name] ([id number]) -------------------------------------------------------------------------------- SuperBill Details Patient Name: [name], [name]. Date of Service: 03/27/2021 Medical Record Number: [id number] Patient Account Number: [id number] Date of Birth/Sex: [birth year] (81 y.o. 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 Z85.6 Personal history of leukemia Facility Procedures CPT4 Code: [id number] Description: (Facility Use Only) [phone] - APPLY MULTLAY COMPRS LWR  LT LEG Modifier: Quantity: 1 Physician Procedures CPT4 Code: [id number] Description: 61443 - WC PHYS LEVEL 4 - EST PT Modifier: Quantity: 1 CPT4 Code: Description: ICD-10 Diagnosis Description 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

## 2021-03-27 NOTE — Progress Notes (Addendum)
DONNEL, VENUTO ([id number]) Visit Report for 03/27/2021 Arrival Information Details Patient Name: [name], [name]. Date of Service: 03/27/2021 2:30 PM Medical Record Number: [id number] Patient Account Number: [id number] Date of Birth/Sex: 01/17/1941 (81 y.o. M) Treating RN: Donnamarie Poag Primary Care Joycelyn Liska: Elsie Stain Other Clinician: Referring Elishia Kaczorowski: Elsie Stain Treating Cristianna Cyr/Extender: Skipper Cliche in Treatment: 4 Visit Information History Since Last Visit Added or deleted any medications: No Patient Arrived: Wheel Chair Had a fall or experienced change in No Arrival Time: 14:44 activities of daily living that may affect Accompanied By: son risk of falls: Transfer Assistance: [employer] Patient Lift Hospitalized since last visit: No Patient Identification Verified: Yes Has Dressing in Place as Prescribed: Yes Patient Requires Transmission-Based No Has Compression in Place as Prescribed: Yes Precautions: Pain Present Now: No Patient Has Alerts: Yes Patient Alerts: Patient on Blood Thinner 81MG x 2 NOT DIABETIC Electronic Signature(s) Signed: 03/27/2021 3:45:38 PM By: Donnamarie Poag Entered By: Donnamarie Poag on 03/27/2021 14:45:13 [name] ([id number]) -------------------------------------------------------------------------------- Compression Therapy Details Patient Name: [name]. Date of Service: 03/27/2021 2:30 PM Medical Record Number: [id number] Patient Account Number: [id number] Date of Birth/Sex: 02/17/1940 (81 y.o. M) Treating RN: Donnamarie Poag Primary Care Meade Hogeland: Elsie Stain Other Clinician: Referring Carnesha Maravilla: Elsie Stain Treating Edwardine Deschepper/Extender: Skipper Cliche in Treatment: 4 Compression Therapy Performed for Wound Assessment: Wound #1 Left,Lateral Ankle Performed By: Clinician Donnamarie Poag, RN Compression Type: Three Layer Post Procedure Diagnosis Same as Pre-procedure Electronic Signature(s) Signed: 03/27/2021  3:45:38 PM By: Donnamarie Poag Entered By: Donnamarie Poag on 03/27/2021 15:05:49 [name] ([id number]) -------------------------------------------------------------------------------- Encounter Discharge Information Details Patient Name: [name], [name]. Date of Service: 03/27/2021 2:30 PM Medical Record Number: [id number] Patient Account Number: [id number] Date of Birth/Sex: Mar 28, 1940 (81 y.o. M) Treating RN: Donnamarie Poag Primary Care Stalin Gruenberg: Elsie Stain Other Clinician: Referring Shaketha Jeon: Elsie Stain Treating Kenry Daubert/Extender: Skipper Cliche in Treatment: 4 Encounter Discharge Information Items Discharge Condition: Stable Ambulatory Status: Wheelchair Discharge Destination: Home Transportation: Private Auto Accompanied By: son Schedule Follow-up Appointment: Yes Clinical Summary of Care: Electronic Signature(s) Signed: 03/27/2021 3:45:38 PM By: Donnamarie Poag Entered By: Donnamarie Poag on 03/27/2021 15:07:09 [name] ([id number]) -------------------------------------------------------------------------------- Lower Extremity Assessment Details Patient Name: [name], [name]. Date of Service: 03/27/2021 2:30 PM Medical Record Number: [id number] Patient Account Number: [id number] Date of Birth/Sex: 12-Jun-1940 (81 y.o. M) Treating RN: Donnamarie Poag Primary Care Sanskriti Greenlaw: Elsie Stain Other Clinician: Referring Cyris Maalouf: Elsie Stain Treating Jerline Linzy/Extender: Jeri Cos Weeks in Treatment: 4 Edema Assessment Assessed: Shirlyn Goltz: Yes] [Right: No] Edema: [Left: Ye] [Right: s] Calf Left: Right: Point of Measurement: 36 cm From Medial Instep 35 cm Ankle Left: Right: Point of Measurement: 15 cm From Medial Instep 25 cm Electronic Signature(s) Signed: 04/03/2021 3:50:59 PM By: Donnamarie Poag Previous Signature: 03/27/2021 3:45:38 PM Version By: Donnamarie Poag Entered By: Donnamarie Poag on 04/03/2021 11:43:55 [name]  ([id number]) -------------------------------------------------------------------------------- Multi Wound Chart Details Patient Name: [name]. Date of Service: 03/27/2021 2:30 PM Medical Record Number: [id number] Patient Account Number: [id number] Date of Birth/Sex: 1940/10/01 (81 y.o. M) Treating RN: Donnamarie Poag Primary Care Nashya Garlington: Elsie Stain Other Clinician: Referring Ringo Sherod: Elsie Stain Treating Yalonda Sample/Extender: Skipper Cliche in Treatment: 4 Vital Signs Height(in): 76 Pulse(bpm): 85 Weight(lbs): 200 Blood Pressure(mmHg): 122/69 Body Mass Index(BMI): 24.3 Temperature(F): 98.2 Respiratory Rate(breaths/min): 16 Photos: [N/A:N/A] Wound Location: Left, Lateral Ankle N/A N/A Wounding Event: Trauma N/A N/A Primary Etiology: Neuropathic Ulcer-Non Diabetic N/A N/A Comorbid History: Cataracts, Neuropathy N/A N/A Date Acquired: 02/07/2021 N/A  N/A Weeks of Treatment: 4 N/A N/A Wound Status: Open N/A N/A Wound Recurrence: No N/A N/A Measurements L x W x D (cm) 1x1x0.4 N/A N/A Area (cm) : 0.785 N/A N/A Volume (cm) : 0.314 N/A N/A % Reduction in Area: 53.70% N/A N/A % Reduction in Volume: 63.00% N/A N/A Starting Position 1 (o'clock): 7 Ending Position 1 (o'clock): 10 Maximum Distance 1 (cm): 0.4 Undermining: Yes N/A N/A Classification: Full Thickness With Exposed N/A N/A Support Structures Exudate Amount: Large N/A N/A Exudate Type: Serous N/A N/A Exudate Color: amber N/A N/A Wound Margin: Flat and Intact N/A N/A Granulation Amount: Large (67-100%) N/A N/A Granulation Quality: Red, Hyper-granulation N/A N/A Necrotic Amount: Small (1-33%) N/A N/A Exposed Structures: Fat Layer (Subcutaneous Tissue): N/A N/A Yes Tendon: Yes Muscle: Yes Fascia: No Joint: No Bone: No Treatment Notes Electronic Signature(s) Signed: 03/27/2021 3:45:38 PM By: Donnamarie Poag Entered By: Donnamarie Poag on 03/27/2021 15:03:27 [name], [name] ([id number] Lotts, Minette Brine  ([id number]) -------------------------------------------------------------------------------- Multi-Disciplinary Care Plan Details Patient Name: [name], [name]. Date of Service: 03/27/2021 2:30 PM Medical Record Number: [id number] Patient Account Number: [id number] Date of Birth/Sex: March 05, 1940 (81 y.o. M) Treating RN: Donnamarie Poag Primary Care Thalia Turkington: Elsie Stain Other Clinician: Referring Raeana Blinn: Elsie Stain Treating Michie Molnar/Extender: Skipper Cliche in Treatment: 4 Active Inactive Necrotic Tissue Nursing Diagnoses: Impaired tissue integrity related to necrotic/devitalized tissue Knowledge deficit related to management of necrotic/devitalized tissue Goals: Necrotic/devitalized tissue will be minimized in the wound bed Date Initiated: 02/27/2021 Target Resolution Date: 02/27/2021 Goal Status: Active Patient/caregiver will verbalize understanding of reason and process for debridement of necrotic tissue Date Initiated: 02/27/2021 Date Inactivated: 03/27/2021 Target Resolution Date: 02/27/2021 Goal Status: Met Interventions: Assess patient pain level pre-, during and post procedure and prior to discharge Provide education on necrotic tissue and debridement process Treatment Activities: Excisional debridement : 02/27/2021 Notes: Wound/Skin Impairment Nursing Diagnoses: Impaired tissue integrity Knowledge deficit related to smoking impact on wound healing Knowledge deficit related to ulceration/compromised skin integrity Goals: Patient/caregiver will verbalize understanding of skin care regimen Date Initiated: 02/27/2021 Date Inactivated: 03/27/2021 Target Resolution Date: 02/27/2021 Goal Status: Met Ulcer/skin breakdown will have a volume reduction of 30% by week 4 Date Initiated: 02/27/2021 Target Resolution Date: 03/27/2021 Goal Status: Active Ulcer/skin breakdown will have a volume reduction of 50% by week 8 Date Initiated: 02/27/2021 Target Resolution Date:  04/24/2021 Goal Status: Active Interventions: Assess ulceration(s) every visit Treatment Activities: Skin care regimen initiated : 02/27/2021 Topical wound management initiated : 02/27/2021 Notes: Electronic Signature(s) Signed: 03/27/2021 3:45:38 PM By: Erroll Luna ([id number]) Entered By: Donnamarie Poag on 03/27/2021 15:03:03 [name], [name] ([id number]) -------------------------------------------------------------------------------- Pain Assessment Details Patient Name: [name]. Date of Service: 03/27/2021 2:30 PM Medical Record Number: [id number] Patient Account Number: [id number] Date of Birth/Sex: 26-Sep-1940 (81 y.o. M) Treating RN: Donnamarie Poag Primary Care Ellard Nan: Elsie Stain Other Clinician: Referring Niomie Englert: Elsie Stain Treating Byanka Landrus/Extender: Skipper Cliche in Treatment: 4 Active Problems Location of Pain Severity and Description of Pain Patient Has Paino No Site Locations Rate the pain. Current Pain Level: 0 Pain Management and Medication Current Pain Management: Electronic Signature(s) Signed: 03/27/2021 3:45:38 PM By: Donnamarie Poag Entered By: Donnamarie Poag on 03/27/2021 14:48:30 [name] ([id number]) -------------------------------------------------------------------------------- Patient/Caregiver Education Details Patient Name: [name]. Date of Service: 03/27/2021 2:30 PM Medical Record Number: [id number] Patient Account Number: [id number] Date of Birth/Gender: 1940-03-12 (81 y.o. M) Treating RN: Donnamarie Poag Primary Care Physician: Elsie Stain Other Clinician: Referring Physician: Elsie Stain Treating  Physician/Extender: Skipper Cliche in Treatment: 4 Education Assessment Education Provided To: Patient Education Topics Provided Wound Debridement: Wound/Skin Impairment: Electronic Signature(s) Signed: 03/27/2021 3:45:38 PM By: Donnamarie Poag Entered By: Donnamarie Poag on 03/27/2021 15:06:34 [name], [name] ([id number]) -------------------------------------------------------------------------------- Wound Assessment Details Patient Name: [name], [name]. Date of Service: 03/27/2021 2:30 PM Medical Record Number: [id number] Patient Account Number: [id number] Date of Birth/Sex: November 18, 1940 (81 y.o. M) Treating RN: Donnamarie Poag Primary Care Khalif Stender: Elsie Stain Other Clinician: Referring Askari Kinley: Elsie Stain Treating Shanyla Marconi/Extender: Jeri Cos Weeks in Treatment: 4 Wound Status Wound Number: 1 Primary Etiology: Neuropathic Ulcer-Non Diabetic Wound Location: Left, Lateral Ankle Wound Status: Open Wounding Event: Trauma Comorbid History: Cataracts, Neuropathy Date Acquired: 02/07/2021 Weeks Of Treatment: 4 Clustered Wound: No Photos Wound Measurements Length: (cm) 1 Width: (cm) 1 Depth: (cm) 0.4 Area: (cm) 0.785 Volume: (cm) 0.314 % Reduction in Area: 53.7% % Reduction in Volume: 63% Tunneling: No Undermining: Yes Starting Position (o'clock): 7 Ending Position (o'clock): 10 Maximum Distance: (cm) 0.4 Wound Description Classification: Full Thickness With Exposed Support Structures Wound Margin: Flat and Intact Exudate Amount: Large Exudate Type: Serous Exudate Color: amber Foul Odor After Cleansing: No Slough/Fibrino Yes Wound Bed Granulation Amount: Large (67-100%) Exposed Structure Granulation Quality: Red, Hyper-granulation Fascia Exposed: No Necrotic Amount: Small (1-33%) Fat Layer (Subcutaneous Tissue) Exposed: Yes Necrotic Quality: Adherent Slough Tendon Exposed: Yes Muscle Exposed: Yes Necrosis of Muscle: No Joint Exposed: No Bone Exposed: No Electronic Signature(s) Signed: 03/27/2021 3:45:38 PM By: Donnamarie Poag Entered By: Donnamarie Poag on 03/27/2021 14:56:03 [name] ([id number]) -------------------------------------------------------------------------------- [place] Details Patient Name: [name]. Date of Service: 03/27/2021  2:30 PM Medical Record Number: [id number] Patient Account Number: [id number] Date of Birth/Sex: 04/02/1940 (81 y.o. M) Treating RN: Donnamarie Poag Primary Care Willet Schleifer: Elsie Stain Other Clinician: Referring Tonilynn Bieker: Elsie Stain Treating Nikolaus Pienta/Extender: Skipper Cliche in Treatment: 4 Vital Signs Time Taken: 14:45 Temperature (F): 98.2 Height (in): 76 Pulse (bpm): 74 Weight (lbs): 200 Respiratory Rate (breaths/min): 16 Body Mass Index (BMI): 24.3 Blood Pressure (mmHg): 122/69 Reference Range: 80 - 120 mg / dl Electronic Signature(s) Signed: 03/27/2021 3:45:38 PM By: Donnamarie Poag Entered ByDonnamarie Poag on 03/27/2021 14:47:29

## 2021-04-03 ENCOUNTER — Encounter: Payer: Medicare PPO | Attending: Physician Assistant | Admitting: Physician Assistant

## 2021-04-03 ENCOUNTER — Other Ambulatory Visit: Payer: Self-pay

## 2021-04-03 DIAGNOSIS — L97422 Non-pressure chronic ulcer of left heel and midfoot with fat layer exposed: Secondary | ICD-10-CM | POA: Insufficient documentation

## 2021-04-03 DIAGNOSIS — L97325 Non-pressure chronic ulcer of left ankle with muscle involvement without evidence of necrosis: Secondary | ICD-10-CM | POA: Diagnosis not present

## 2021-04-03 DIAGNOSIS — Z856 Personal history of leukemia: Secondary | ICD-10-CM | POA: Insufficient documentation

## 2021-04-03 DIAGNOSIS — G9009 Other idiopathic peripheral autonomic neuropathy: Secondary | ICD-10-CM | POA: Insufficient documentation

## 2021-04-03 DIAGNOSIS — L97412 Non-pressure chronic ulcer of right heel and midfoot with fat layer exposed: Secondary | ICD-10-CM | POA: Diagnosis not present

## 2021-04-03 NOTE — Progress Notes (Addendum)
GRAISON, LEINBERGER ([id number]) Visit Report for 04/03/2021 Chief Complaint Document Details Patient Name: [name], [name]. Date of Service: 04/03/2021 11:30 AM Medical Record Number: [id number] Patient Account Number: [id number] Date of Birth/Sex: [birth year] (81 y.o. M) Treating RN: Donnamarie Poag Primary Care Provider: Elsie Stain Other Clinician: Referring Provider: Elsie Stain Treating Provider/Extender: Skipper Cliche in Treatment: 5 Information Obtained from: Patient Chief Complaint Left LE Ulcer Electronic Signature(s) Signed: 04/03/2021 11:36:02 AM By: Worthy Keeler PA-C Entered By: Worthy Keeler on 04/03/2021 11:36:02 CHARLEY, MISKE ([id number]) -------------------------------------------------------------------------------- HPI Details Patient Name: [name], [name]. Date of Service: 04/03/2021 11:30 AM Medical Record Number: [id number] Patient Account Number: [id number] Date of Birth/Sex: [birth year] (81 y.o. M) Treating RN: Donnamarie Poag Primary Care Provider: Elsie Stain Other Clinician: Referring Provider: Elsie Stain Treating Provider/Extender: Skipper Cliche in Treatment: 5 History of Present Illness HPI Description: 02/27/2021 upon evaluation today patient presents for initial evaluation here in the clinic sent concerning an issue on the left lateral heel which has been present currently for about 3 weeks that he notes. With that being said he did have an issue where he hit this area on a lawnmower over the summer 2022. Nonetheless the patient tells me that unfortunately though it did not seem to give him a lot of trouble immediately following this reopened more recently. He has not had any x-rays up to this time. 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 Subsequently he did have a fairly good arterial test today here in the clinic measured at 1.03 but I am somewhat cautious with that due to the fact that his extremity is very cold nonetheless he seems to have pretty good blood flow of the wound when I am cleaning this today. We will keep an eye on this Michael if I need to send him for formal arterial testing I will do so. 03/13/2021 upon evaluation today patient appears to be doing well with regard to his wound. He has been tolerating the dressing changes without complication. Fortunately he does not show any signs of active infection at this time which is great news. No fevers, chills, nausea, vomiting, or diarrhea. With that being said his x-ray was negative for any signs of obvious osteomyelitis obviously an x-ray is not the best test for this but an MRI would be. Nonetheless I do not think that we need to jump straight to that at this point. 03/27/2021 upon evaluation today patient actually appears to be doing extremely well in regard to his wounds. He has been tolerating the dressing changes on the left lateral ankle. I am actually extremely pleased with how this appears Michael I think that he is making excellent progress. Fortunately I do not see any evidence of active infection at this time which is great news. 04/03/2021 upon evaluation today patient appears to be doing well with regard to his wound. Has been tolerating the dressing changes without complication. Fortunately there is no sign of active infection locally or systemically which is great news. No fevers, chills, nausea, vomiting, or diarrhea. Electronic Signature(s) Signed: 04/03/2021 1:23:33 PM By: Worthy Keeler PA-C Entered By: Worthy Keeler on 04/03/2021 13:23:33 ZAQUAN, DUFFNER ([id number]) -------------------------------------------------------------------------------- Physical Exam Details Patient Name: [name], [name].  Date of  Service: 04/03/2021 11:30 AM Medical Record Number: [id number] Patient Account Number: [id number] Date of Birth/Sex: 01-Dec-1940 (81 y.o. M) Treating RN: Donnamarie Poag Primary Care Provider: Elsie Stain Other Clinician: Referring Provider: Elsie Stain Treating Provider/Extender: Skipper Cliche in Treatment: 5 Constitutional Well-nourished Michael well-hydrated in no acute distress. Respiratory normal breathing without difficulty. Psychiatric this patient is able to make decisions Michael demonstrates good insight into disease process. Alert Michael Oriented x 3. pleasant Michael cooperative. Notes Patient's wound bed again is showing signs of excellent improvement I am seeing much more granulation than what we have noticed in the past Michael I think we are getting very close to complete resolution. Fortunately I think that the patient is doing extremely well currently. Electronic Signature(s) Signed: 04/03/2021 1:23:49 PM By: Worthy Keeler PA-C Entered By: Worthy Keeler on 04/03/2021 13:23:49 KENNEDY, BOHANON ([id number]) -------------------------------------------------------------------------------- Physician Orders Details Patient Name: [name], [name]. Date of Service: 04/03/2021 11:30 AM Medical Record Number: [id number] Patient Account Number: [id number] Date of Birth/Sex: [birth year] (81 y.o. 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 o Nurse Visit as needed Bathing/ Shower/ Hygiene Wound #1 Left,Lateral Ankle o May  shower with wound dressing protected with water repellent cover or cast protector. o No tub bath. Anesthetic (Use 'Patient Medications' Section for Anesthetic Order Entry) o Lidocaine applied to wound bed Edema Control - Lymphedema / Segmental Compressive Device / Other o Elevate, Exercise Daily Michael Avoid Standing for Long Periods of Time. o Elevate legs to the level of the heart Michael pump ankles as often as possible o Elevate leg(s) parallel to the floor when sitting. Additional Orders / Instructions o Follow Nutritious Diet Michael Increase Protein Intake Wound Treatment Wound #1 - Ankle Wound Laterality: Left, Lateral Cleanser: Soap Michael Water 1 x Per Week/15 Days Discharge Instructions: Gently cleanse wound with antibacterial soap, rinse Michael pat dry prior to dressing wounds Primary Dressing: Prisma 4.34 (in) 1 x Per Week/15 Days Discharge Instructions: Moisten w/normal saline or sterile water; Cover wound as directed. Do not remove from wound bed. Secondary Dressing: Zetuvit Plus Silicone Non-bordered 5x5 (in/in) 1 x Per Week/15 Days Compression Wrap: Profore Lite LF 3 Multilayer Compression Bandaging System 1 x Per Week/15 Days Discharge Instructions: Apply 3 multi-layer wrap as prescribed. Electronic Signature(s) Signed: 04/03/2021 3:50:59 PM By: Donnamarie Poag Signed: 04/03/2021 5:22:07 PM By: Worthy Keeler PA-C Entered By: Donnamarie Poag on 04/03/2021 12:03:34 RAYSHAUN, NEEDLE ([id number]) -------------------------------------------------------------------------------- Problem List Details Patient Name: [name], [name]. Date of Service: 04/03/2021 11:30 AM Medical Record Number: [id number] Patient Account Number: [id number] Date of Birth/Sex: [birth year] (81 y.o. 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 Date of Service: 04/03/2021 11:30 AM Medical Record Number: [id number] Patient Account Number: [id number] Date of Birth/Sex: 01/27/1941 (81 y.o. M) Treating RN: Donnamarie Poag Primary Care Provider: Elsie Stain Other Clinician: Referring Provider: Elsie Stain Treating Provider/Extender: Skipper Cliche in Treatment: 5 Subjective Chief Complaint Information obtained from Patient Left LE Ulcer History of Present Illness (HPI) 02/27/2021 upon evaluation today patient presents for initial evaluation here in the clinic sent concerning an issue on the left lateral heel which has been present currently for about 3 weeks that he notes. With that being said he did have an issue where he hit this area on a lawnmower over the summer 2022. Nonetheless the patient tells me that unfortunately though it did not seem to give him a lot of trouble immediately following this reopened more recently. He has not had any x-rays up to this time. 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 He does not have any other major medical problems such as high blood pressure otherwise Michael again does not have diabetes though he does have significant neuropathy secondary to his other chronic conditions. Subsequently he did have a fairly good arterial test today here in the clinic measured at  1.03 but I am somewhat cautious with that due to the fact that his extremity is very cold nonetheless he seems to have pretty good blood flow of the wound when I am cleaning this today. We will keep an eye on this Michael if I need to send him for formal arterial testing I will do so. 03/13/2021 upon evaluation today patient appears to be doing well with regard to his wound. He has been tolerating the dressing changes without complication. Fortunately he does not show any signs of active infection at this time which is great news. No fevers, chills, nausea, vomiting, or diarrhea. With that being said his x-ray was negative for any signs of obvious osteomyelitis obviously an x-ray is not the best test for this but an MRI would be. Nonetheless I do not think that we need to jump straight to that at this point. 03/27/2021 upon evaluation today patient actually appears to be doing extremely well in regard to his wounds. He has been tolerating the dressing changes on the left lateral ankle. I am actually extremely pleased with how this appears Michael I think that he is making excellent progress. Fortunately I do not see any evidence of active infection at this time which is great news. 04/03/2021 upon evaluation today patient appears to be doing well with regard to his wound. Has been tolerating the dressing changes without complication. Fortunately there is no sign of active infection locally or systemically which is great news. No fevers, chills, nausea, vomiting, or diarrhea. Objective Constitutional Well-nourished Michael well-hydrated in no acute distress. Vitals Time Taken: 11:36 AM, Height: 76 in, Weight: 200 lbs, BMI: 24.3, Temperature: 98 F, Pulse: 69 bpm, Respiratory Rate: 16 breaths/min, Blood Pressure: 131/69 mmHg. Respiratory normal breathing without difficulty. Psychiatric this patient is able to make decisions Michael demonstrates good insight into disease process. Alert Michael Oriented x 3. pleasant Michael  cooperative. General Notes: Patient's wound bed again is showing signs of excellent improvement I am seeing much more granulation than what we have noticed in the past Michael I think we are getting very close to complete resolution. Fortunately I think that the  patient is doing extremely well currently. Integumentary (Hair, Skin) Wound #1 status is Open. Original cause of wound was Trauma. The date acquired was: 02/07/2021. The wound has been in treatment 5 weeks. The wound is located on the Left,Lateral Ankle. The wound measures 1cm length x 1cm width x 0.4cm depth; 0.785cm^2 area Michael 0.314cm^3 volume. There is tendon Michael Fat Layer (Subcutaneous Tissue) exposed. There is no tunneling or undermining noted. There is a large amount of serous drainage noted. The wound margin is flat Michael intact. There is large (67-100%) red, hyper - granulation within the wound bed. There is a small (1-33%) amount of necrotic tissue within the wound bed including Adherent Slough. 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 There was a Three Layer Compression Therapy Procedure by Donnamarie Poag, RN. Post procedure Diagnosis Wound #1: Same as Pre-Procedure Plan Follow-up Appointments: Wound #1 Left,Lateral Ankle: Return Appointment in 1 week. Nurse Visit as needed Bathing/ Shower/ Hygiene: Wound #1 Left,Lateral Ankle: May shower with wound dressing protected with water repellent cover or cast protector. No tub bath. Anesthetic (Use 'Patient Medications' Section for Anesthetic Order Entry): Lidocaine applied to wound bed Edema Control - Lymphedema / Segmental Compressive Device / Other: Elevate, Exercise Daily Michael  Avoid Standing for Long Periods of Time. Elevate legs to the level of the heart Michael pump ankles as often as possible Elevate leg(s) parallel to the floor when sitting. Additional Orders / Instructions: Follow Nutritious Diet Michael Increase Protein Intake WOUND #1: - Ankle Wound Laterality: Left, Lateral Cleanser: Soap Michael Water 1 x Per Week/15 Days Discharge Instructions: Gently cleanse wound with antibacterial soap, rinse Michael pat dry prior to dressing wounds Primary Dressing: Prisma 4.34 (in) 1 x Per Week/15 Days Discharge Instructions: Moisten w/normal saline or sterile water; Cover wound as directed. Do not remove from wound bed. Secondary Dressing: Zetuvit Plus Silicone Non-bordered 5x5 (in/in) 1 x Per Week/15 Days Compression Wrap: Profore Lite LF 3 Multilayer Compression Bandaging System 1 x Per Week/15 Days Discharge Instructions: Apply 3 multi-layer wrap as prescribed. 1. Would recommend that we continue with the silver collagen dressing I think this still can be the best way to go he is in agreement with plan. 2. Also can recommend that we continue with the Zetuvit dressing to cover. 3. We will continue with the 3 layer compression wrap which is doing an awesome job. We will see patient back for reevaluation in 1 week here in the clinic. If anything worsens or changes patient will contact our office for additional recommendations. Electronic Signature(s) Signed: 04/03/2021 1:24:22 PM By: Worthy Keeler PA-C Entered By: Worthy Keeler on 04/03/2021 13:24:22 LEVENT, KORNEGAY ([id number]) -------------------------------------------------------------------------------- SuperBill Details Patient Name: Morgan, [name]. Date of Service: 04/03/2021 Medical Record Number: [id number] Patient Account Number: [id number] Date of Birth/Sex: 1940-05-19 (81 y.o. 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 Z85.6 Personal history of leukemia Facility Procedures CPT4 Code: 05397673 Description: (Facility Use Only) 29581LT - APPLY MULTLAY COMPRS LWR LT LEG Modifier: Quantity: 1 Physician  Procedures CPT4 Code: 3832919 Description: 16606 - WC PHYS LEVEL 4 - EST PT Modifier: Quantity: 1 CPT4 Code: Description: ICD-10 Diagnosis Description 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

## 2021-04-03 NOTE — Progress Notes (Signed)
DEMARKO, ZEIMET ([id number]) Visit Report for 04/03/2021 Arrival Information Details Patient Name: [name], [name]. Date of Service: 04/03/2021 11:30 AM Medical Record Number: [id number] Patient Account Number: [id number] Date of Birth/Sex: Mar 27, 1940 (81 y.o. M) Treating RN: Donnamarie Poag Primary Care [place] Schweppe: Elsie Stain Other Clinician: Referring Farhiya Rosten: Elsie Stain Treating Daeron Carreno/Extender: Skipper Cliche in Treatment: 5 Visit Information History Since Last Visit Added or deleted any medications: No Patient Arrived: Wheel Chair Had a fall or experienced change in No Arrival Time: 11:35 activities of daily living that may affect Accompanied By: son risk of falls: Transfer Assistance: [employer] Patient Lift Hospitalized since last visit: No Patient Identification Verified: Yes Has Dressing in Place as Prescribed: Yes Secondary Verification Process Completed: Yes Has Compression in Place as Prescribed: Yes Patient Requires Transmission-Based No Pain Present Now: No Precautions: Patient Has Alerts: Yes Patient Alerts: Patient on Blood Thinner 81MG x 2 NOT DIABETIC Electronic Signature(s) Signed: 04/03/2021 3:50:59 PM By: Donnamarie Poag Entered By: Donnamarie Poag on 04/03/2021 11:35:59 [name] ([id number]) -------------------------------------------------------------------------------- Compression Therapy Details Patient Name: [name]. Date of Service: 04/03/2021 11:30 AM Medical Record Number: [id number] Patient Account Number: [id number] Date of Birth/Sex: 18-May-1940 (81 y.o. M) Treating RN: Donnamarie Poag Primary Care Raydan Schlabach: Elsie Stain Other Clinician: Referring Jadene Stemmer: Elsie Stain Treating Riddik Senna/Extender: Skipper Cliche in Treatment: 5 Compression Therapy Performed for Wound Assessment: Wound #1 Left,Lateral Ankle Performed By: Clinician Donnamarie Poag, RN Compression Type: Three Layer Post Procedure Diagnosis Same as  Pre-procedure Electronic Signature(s) Signed: 04/03/2021 3:50:59 PM By: Donnamarie Poag Entered By: Donnamarie Poag on 04/03/2021 11:46:33 [name] ([id number]) -------------------------------------------------------------------------------- Encounter Discharge Information Details Patient Name: [name], [name]. Date of Service: 04/03/2021 11:30 AM Medical Record Number: [id number] Patient Account Number: [id number] Date of Birth/Sex: 25-Aug-1940 (81 y.o. M) Treating RN: Donnamarie Poag Primary Care Benna Arno: Elsie Stain Other Clinician: Referring Yarden Hillis: Elsie Stain Treating Tylisa Alcivar/Extender: Skipper Cliche in Treatment: 5 Encounter Discharge Information Items Discharge Condition: Stable Ambulatory Status: Wheelchair Discharge Destination: Home Transportation: Private Auto Accompanied By: son Schedule Follow-up Appointment: Yes Clinical Summary of Care: Electronic Signature(s) Signed: 04/03/2021 3:50:59 PM By: Donnamarie Poag Entered By: Donnamarie Poag on 04/03/2021 12:04:46 [name] ([id number]) -------------------------------------------------------------------------------- Lower Extremity Assessment Details Patient Name: [name], [name]. Date of Service: 04/03/2021 11:30 AM Medical Record Number: [id number] Patient Account Number: [id number] Date of Birth/Sex: Jan 18, 1941 (81 y.o. M) Treating RN: Donnamarie Poag Primary Care Zykia Walla: Elsie Stain Other Clinician: Referring Meleane Selinger: Elsie Stain Treating Napoleon Monacelli/Extender: Jeri Cos Weeks in Treatment: 5 Edema Assessment Assessed: Shirlyn Goltz: Yes] [Right: No] [Left: Edema] [Right: :] Calf Left: Right: Point of Measurement: 36 cm From Medial Instep 35 cm Ankle Left: Right: Point of Measurement: 15 cm From Medial Instep 25 cm Electronic Signature(s) Signed: 04/03/2021 3:50:59 PM By: Donnamarie Poag Entered By: Donnamarie Poag on 04/03/2021 11:43:19 [name]  ([id number]) -------------------------------------------------------------------------------- Multi Wound Chart Details Patient Name: [name]. Date of Service: 04/03/2021 11:30 AM Medical Record Number: [id number] Patient Account Number: [id number] Date of Birth/Sex: Jan 17, 1941 (81 y.o. M) Treating RN: Donnamarie Poag Primary Care Donyel Castagnola: Elsie Stain Other Clinician: Referring Laverne Hursey: Elsie Stain Treating Ehsan Corvin/Extender: Skipper Cliche in Treatment: 5 Vital Signs Height(in): 76 Pulse(bpm): 54 Weight(lbs): 200 Blood Pressure(mmHg): 131/69 Body Mass Index(BMI): 24.3 Temperature(F): 98 Respiratory Rate(breaths/min): 16 Photos: [N/A:N/A] Wound Location: Left, Lateral Ankle N/A N/A Wounding Event: Trauma N/A N/A Primary Etiology: Neuropathic Ulcer-Non Diabetic N/A N/A Comorbid History: Cataracts, Neuropathy N/A N/A Date Acquired: 02/07/2021 N/A N/A Weeks of Treatment: 5  N/A N/A Wound Status: Open N/A N/A Wound Recurrence: No N/A N/A Measurements L x W x D (cm) 1x1x0.4 N/A N/A Area (cm) : 0.785 N/A N/A Volume (cm) : 0.314 N/A N/A % Reduction in Area: 53.70% N/A N/A % Reduction in Volume: 63.00% N/A N/A Classification: Full Thickness With Exposed N/A N/A Support Structures Exudate Amount: Large N/A N/A Exudate Type: Serous N/A N/A Exudate Color: amber N/A N/A Wound Margin: Flat and Intact N/A N/A Granulation Amount: Large (67-100%) N/A N/A Granulation Quality: Red, Hyper-granulation N/A N/A Necrotic Amount: Small (1-33%) N/A N/A Exposed Structures: Fat Layer (Subcutaneous Tissue): N/A N/A Yes Tendon: Yes Muscle: Yes Fascia: No Joint: No Bone: No Treatment Notes Electronic Signature(s) Signed: 04/03/2021 3:50:59 PM By: Donnamarie Poag Entered By: Donnamarie Poag on 04/03/2021 11:44:36 [name] ([id number]) -------------------------------------------------------------------------------- [place] Details Patient Name: [name], [name]. Date of Service: 04/03/2021 11:30 AM Medical Record Number: [id number] Patient Account Number: [id number] Date of Birth/Sex: [birth year] (81 y.o. M) Treating RN: Donnamarie Poag Primary Care Kashira Behunin: Elsie Stain Other Clinician: Referring Selassie Spatafore: Elsie Stain Treating Malaysha Arlen/Extender: Skipper Cliche in Treatment: 5 Active Inactive Wound/Skin Impairment Nursing Diagnoses: Impaired tissue integrity Knowledge deficit related to smoking impact on wound healing Knowledge deficit related to ulceration/compromised skin integrity Goals: Patient/caregiver will verbalize understanding of skin care regimen Date Initiated: 02/27/2021 Date Inactivated: 03/27/2021 Target Resolution Date: 02/27/2021 Goal Status: Met Ulcer/skin breakdown will have a volume reduction of 30% by week 4 Date Initiated: 02/27/2021 Target Resolution Date: 03/27/2021 Goal Status: Active Ulcer/skin breakdown will have a volume reduction of 50% by week 8 Date Initiated: 02/27/2021 Target Resolution Date: 04/24/2021 Goal Status: Active Interventions: Assess ulceration(s) every visit Treatment Activities: Skin care regimen initiated : 02/27/2021 Topical wound management initiated : 02/27/2021 Notes: Electronic Signature(s) Signed: 04/03/2021 3:50:59 PM By: Donnamarie Poag Entered By: Donnamarie Poag on 04/03/2021 11:44:25 [name] ([id number]) -------------------------------------------------------------------------------- Pain Assessment Details Patient Name: [name]. Date of Service: 04/03/2021 11:30 AM Medical Record Number: [id number] Patient Account Number: [id number] Date of Birth/Sex: November 27, 1940 (81 y.o. M) Treating RN: Donnamarie Poag Primary Care Jafari Mckillop: Elsie Stain Other Clinician: Referring Braelynn Lupton: Elsie Stain Treating Snow Peoples/Extender: Skipper Cliche in Treatment: 5 Active Problems Location of Pain Severity and Description of Pain Patient Has Paino No Site Locations Rate the  pain. Current Pain Level: 0 Pain Management and Medication Current Pain Management: Electronic Signature(s) Signed: 04/03/2021 3:50:59 PM By: Donnamarie Poag Entered By: Donnamarie Poag on 04/03/2021 11:40:33 [name] ([id number]) -------------------------------------------------------------------------------- Patient/Caregiver Education Details Patient Name: [name]. Date of Service: 04/03/2021 11:30 AM Medical Record Number: [id number] Patient Account Number: [id number] Date of Birth/Gender: 1940/12/22 (81 y.o. M) Treating RN: Donnamarie Poag Primary Care Physician: Elsie Stain Other Clinician: Referring Physician: Elsie Stain Treating Physician/Extender: Skipper Cliche in Treatment: 5 Education Assessment Education Provided To: Patient and Caregiver Education Topics Provided Wound/Skin Impairment: Electronic Signature(s) Signed: 04/03/2021 3:50:59 PM By: Donnamarie Poag Entered By: Donnamarie Poag on 04/03/2021 12:04:10 [name], [name] ([id number]) -------------------------------------------------------------------------------- Wound Assessment Details Patient Name: [name], [name]. Date of Service: 04/03/2021 11:30 AM Medical Record Number: [id number] Patient Account Number: [id number] Date of Birth/Sex: May 26, 1940 (81 y.o. M) Treating RN: Donnamarie Poag Primary Care Cotton Beckley: Elsie Stain Other Clinician: Referring Lyfe Reihl: Elsie Stain Treating Marchel Foote/Extender: Jeri Cos Weeks in Treatment: 5 Wound Status Wound Number: 1 Primary Etiology: Neuropathic Ulcer-Non Diabetic Wound Location: Left, Lateral Ankle Wound Status: Open Wounding Event: Trauma Comorbid History: Cataracts, Neuropathy Date Acquired: 02/07/2021 Weeks Of Treatment: 5 Clustered  Wound: No Photos Wound Measurements Length: (cm) 1 Width: (cm) 1 Depth: (cm) 0.4 Area: (cm) 0.785 Volume: (cm) 0.314 % Reduction in Area: 53.7% % Reduction in Volume: 63% Tunneling: No Undermining: No Wound  Description Classification: Full Thickness With Exposed Support Structures Wound Margin: Flat and Intact Exudate Amount: Large Exudate Type: Serous Exudate Color: amber Foul Odor After Cleansing: No Slough/Fibrino Yes Wound Bed Granulation Amount: Large (67-100%) Exposed Structure Granulation Quality: Red, Hyper-granulation Fascia Exposed: No Necrotic Amount: Small (1-33%) Fat Layer (Subcutaneous Tissue) Exposed: Yes Necrotic Quality: Adherent Slough Tendon Exposed: Yes Muscle Exposed: No Joint Exposed: No Bone Exposed: No Treatment Notes Wound #1 (Ankle) Wound Laterality: Left, Lateral Cleanser Soap and Water Discharge Instruction: Gently cleanse wound with antibacterial soap, rinse and pat dry prior to dressing wounds Peri-Wound Care [name], [name] ([id number]) Topical Primary Dressing Prisma 4.34 (in) Discharge Instruction: Moisten w/normal saline or sterile water; Cover wound as directed. Do not remove from wound bed. Secondary Dressing Zetuvit Plus Silicone Non-bordered 5x5 (in/in) Secured With Compression Wrap Profore Lite LF 3 Multilayer Compression Bandaging System Discharge Instruction: Apply 3 multi-layer wrap as prescribed. Compression Stockings Add-Ons Electronic Signature(s) Signed: 04/03/2021 3:50:59 PM By: Donnamarie Poag Entered By: Donnamarie Poag on 04/03/2021 12:02:52 [name], [name] ([id number]) -------------------------------------------------------------------------------- [place] Details Patient Name: [name]. Date of Service: 04/03/2021 11:30 AM Medical Record Number: [id number] Patient Account Number: [id number] Date of Birth/Sex: [birth year] (81 y.o. M) Treating RN: Donnamarie Poag Primary Care Lille Karim: Elsie Stain Other Clinician: Referring Sakari Alkhatib: Elsie Stain Treating Gavrielle Streck/Extender: Skipper Cliche in Treatment: 5 Vital Signs Time Taken: 11:36 Temperature (F): 98 Height (in): 76 Pulse (bpm): 69 Weight (lbs):  200 Respiratory Rate (breaths/min): 16 Body Mass Index (BMI): 24.3 Blood Pressure (mmHg): 131/69 Reference Range: 80 - 120 mg / dl Electronic Signature(s) Signed: 04/03/2021 3:50:59 PM By: Donnamarie Poag Entered ByDonnamarie Poag on 04/03/2021 11:36:46

## 2021-04-08 ENCOUNTER — Encounter: Payer: Medicare PPO | Admitting: Physician Assistant

## 2021-04-08 ENCOUNTER — Other Ambulatory Visit: Payer: Self-pay

## 2021-04-08 DIAGNOSIS — L97422 Non-pressure chronic ulcer of left heel and midfoot with fat layer exposed: Secondary | ICD-10-CM | POA: Diagnosis not present

## 2021-04-08 DIAGNOSIS — Z856 Personal history of leukemia: Secondary | ICD-10-CM | POA: Diagnosis not present

## 2021-04-08 DIAGNOSIS — G9009 Other idiopathic peripheral autonomic neuropathy: Secondary | ICD-10-CM | POA: Diagnosis not present

## 2021-04-08 DIAGNOSIS — L97412 Non-pressure chronic ulcer of right heel and midfoot with fat layer exposed: Secondary | ICD-10-CM | POA: Diagnosis not present

## 2021-04-08 NOTE — Progress Notes (Addendum)
SHYQUAN, STALLBAUMER ([id number]) Visit Report for 04/08/2021 Chief Complaint Document Details Patient Name: [name], SPELLMAN. Date of Service: 04/08/2021 11:15 AM Medical Record Number: [id number] Patient Account Number: [id number] Date of Birth/Sex: 05-04-1940 (81 y.o. M) Treating RN: Levora Dredge Primary Care Provider: Elsie Stain Other Clinician: Referring Provider: Elsie Stain Treating Provider/Extender: Skipper Cliche in Treatment: 5 Information Obtained from: Patient Chief Complaint Left LE Ulcer Electronic Signature(s) Signed: 04/08/2021 11:32:31 AM By: Worthy Keeler PA-C Entered By: Worthy Keeler on 04/08/2021 11:32:31 JAYSEAN, MANVILLE ([id number]) -------------------------------------------------------------------------------- Debridement Details Patient Name: [name]. Date of Service: 04/08/2021 11:15 AM Medical Record Number: [id number] Patient Account Number: [id number] Date of Birth/Sex: Aug 30, 1940 (81 y.o. M) Treating RN: Levora Dredge Primary Care Provider: Elsie Stain Other Clinician: Referring Provider: Elsie Stain Treating Provider/Extender: Skipper Cliche in Treatment: 5 Debridement Performed for Wound #2 Right Calcaneus Assessment: Performed By: Physician Tommie Sams., PA-C Debridement Type: Debridement Level of Consciousness (Pre- Awake and Alert procedure): Pre-procedure Verification/Time Out Yes - 12:15 Taken: Total Area Debrided (L x W): 0.5 (cm) x 0.9 (cm) = 0.45 (cm) Tissue and other material Viable, Non-Viable, Slough, Subcutaneous, Skin: Dermis , Skin: Epidermis, Slough debrided: Level: Skin/Subcutaneous Tissue Debridement Description: Excisional Instrument: Curette Bleeding: Minimum Hemostasis Achieved: Pressure Response to Treatment: Procedure was tolerated well Level of Consciousness (Post- Awake and Alert procedure): Post Debridement Measurements of Total Wound Length: (cm) 0.5 Width: (cm) 0.9 Depth: (cm)  0.1 Volume: (cm) 0.035 Character of Wound/Ulcer Post Debridement: Stable Post Procedure Diagnosis Same as Pre-procedure Electronic Signature(s) Signed: 04/08/2021 5:07:45 PM By: Levora Dredge Signed: 04/08/2021 7:37:05 PM By: Worthy Keeler PA-C Entered By: Levora Dredge on 04/08/2021 12:16:27 DAVON, ABDELAZIZ ([id number]) -------------------------------------------------------------------------------- HPI Details Patient Name: [name]. Date of Service: 04/08/2021 11:15 AM Medical Record Number: [id number] Patient Account Number: [id number] Date of Birth/Sex: January 15, 1941 (81 y.o. M) Treating RN: Levora Dredge Primary Care Provider: Elsie Stain Other Clinician: Referring Provider: Elsie Stain Treating Provider/Extender: Skipper Cliche in Treatment: 5 History of Present Illness HPI Description: 02/27/2021 upon evaluation today patient presents for initial evaluation here in the clinic sent concerning an issue on the left lateral heel which has been present currently for about 3 weeks that he notes. With that being said he did have an issue where he hit this area on a lawnmower over the summer 2022. Nonetheless the patient tells me that unfortunately though it did not seem to give him a lot of trouble immediately following this reopened more recently. He has not had any x-rays up to this time. 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 Subsequently he did have a fairly good arterial test today here in the clinic measured at 1.03 but I am somewhat cautious with that due to the fact that his extremity is very cold nonetheless he seems to have pretty good blood flow of the wound when I am cleaning this today. We will keep an eye on  this and if I need to send him for formal arterial testing I will do so. 03/13/2021 upon evaluation today patient appears to be doing well with regard to his wound. He has been tolerating the dressing changes without complication. Fortunately he does not show any signs of  active infection at this time which is great news. No fevers, chills, nausea, vomiting, or diarrhea. With that being said his x-ray was negative for any signs of obvious osteomyelitis obviously an x-ray is not the best test for this but an MRI would be. Nonetheless I do not think that we need to jump straight to that at this point. 03/27/2021 upon evaluation today patient actually appears to be doing extremely well in regard to his wounds. He has been tolerating the dressing changes on the left lateral ankle. I am actually extremely pleased with how this appears and I think that he is making excellent progress. Fortunately I do not see any evidence of active infection at this time which is great news. 04/03/2021 upon evaluation today patient appears to be doing well with regard to his wound. Has been tolerating the dressing changes without complication. Fortunately there is no sign of active infection locally or systemically which is great news. No fevers, chills, nausea, vomiting, or diarrhea. 04/08/2021 upon evaluation today patient appears to be doing poorly today in regard to his right heel which unfortunately has an open wound that was not present at the last evaluation. There does not appear to be any signs of active infection locally or systemically at this time which is great news. No fevers, chills, nausea, vomiting, or diarrhea. Electronic Signature(s) Signed: 04/08/2021 5:11:17 PM By: Worthy Keeler PA-C Entered By: Worthy Keeler on 04/08/2021 17:11:17 DIRK, VANAMAN ([id number]) -------------------------------------------------------------------------------- Physical Exam Details Patient Name: [name]. Date of  Service: 04/08/2021 11:15 AM Medical Record Number: [id number] Patient Account Number: [id number] Date of Birth/Sex: Jun 25, 1940 (81 y.o. M) Treating RN: Levora Dredge Primary Care Provider: Elsie Stain Other Clinician: Referring Provider: Elsie Stain Treating Provider/Extender: Jeri Cos Weeks in Treatment: 5 Constitutional Well-nourished and well-hydrated in no acute distress. Respiratory normal breathing without difficulty. Psychiatric this patient is able to make decisions and demonstrates good insight into disease process. Alert and Oriented x 3. pleasant and cooperative. Notes Upon inspection patient's wound bed showed evidence of good granulation and epithelization for the most part. I do believe he is making progress in regard to the left heel. In regard to the right heel this is unfortunate but nonetheless I did have to perform debridement this not a whole lot going on here that should keep this from healing appropriately I hope. Electronic Signature(s) Signed: 04/08/2021 5:11:39 PM By: Worthy Keeler PA-C Entered By: Worthy Keeler on 04/08/2021 17:11:39 OSTEN, JANEK ([id number]) -------------------------------------------------------------------------------- Physician Orders Details Patient Name: TIEGAN, TERPSTRA. Date of Service: 04/08/2021 11:15 AM Medical Record Number: [id number] Patient Account Number: [id number] Date of Birth/Sex: Apr 27, 1940 (81 y.o. 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 o Nurse Visit as needed Bathing/ Shower/ Hygiene Wound #1 Left,Lateral Ankle o May shower with wound dressing protected with water repellent cover or cast protector. o No tub bath. Anesthetic (Use 'Patient Medications' Section for Anesthetic Order Entry) o Lidocaine applied to wound bed Edema Control - Lymphedema / Segmental Compressive Device / Other o Elevate, Exercise Daily and Avoid Standing for Long Periods of Time. o Elevate legs  to the level of the heart and pump ankles as often as possible o Elevate leg(s) parallel to the floor when sitting. Additional Orders / Instructions o Follow Nutritious Diet and Increase Protein Intake Wound Treatment Wound #1 - Ankle Wound Laterality: Left, Lateral Cleanser: Soap and Water 1 x Per Week/15 Days Discharge Instructions: Gently cleanse wound with antibacterial soap, rinse and pat dry prior to dressing wounds Primary Dressing: Prisma 4.34 (in) 1 x Per Week/15 Days Discharge Instructions: Moisten w/normal saline or sterile water; Cover wound as directed. Do not remove from wound bed. Secondary Dressing: (NON-BORDER) Zetuvit Plus Silicone NON-BORDER 5x5 (in/in) 1 x Per Week/15 Days Compression Wrap: 3-LAYER WRAP - Profore Lite LF 3 Multilayer Compression Bandaging System 1 x Per Week/15 Days Discharge Instructions: Apply 3 multi-layer wrap as prescribed. Wound #2 - Calcaneus Wound Laterality: Right Cleanser: Soap and Water 3 x Per Week/15 Days Discharge Instructions: Gently cleanse wound with antibacterial soap, rinse and pat dry prior to dressing wounds Primary Dressing: Prisma 4.34 (in) (DME) (Generic) 3 x Per Week/15 Days Discharge Instructions: Moisten w/normal saline or sterile water; Cover wound as directed. Do not remove from wound bed. Secondary Dressing: (SILICONE BORDER) Zetuvit Plus SILICONE BORDER Dressing 4x4 (in/in) (DME) (Generic) 3 x Per Week/15 Days Discharge Instructions: Please do not put silicone  bordered dressings under wraps. Use non-bordered dressing only. DMITRI, PETTIGREW ([id number]) Electronic Signature(s) Signed: 04/08/2021 5:07:45 PM By: Levora Dredge Signed: 04/08/2021 7:37:05 PM By: Worthy Keeler PA-C Entered By: Levora Dredge on 04/08/2021 12:21:43 TRUST, LEH ([id number]) -------------------------------------------------------------------------------- Problem List Details Patient Name: ILAI, HILLER. Date of Service: 04/08/2021 11:15 AM Medical Record Number: [id number] Patient Account Number: [id number] Date of Birth/Sex: 12/08/1940 (81 y.o. 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 02/27/2021 No Yes L97.422 Non-pressure chronic ulcer of left heel and midfoot with fat layer 02/27/2021 No Yes exposed L97.412 Non-pressure chronic ulcer of right heel and midfoot with fat layer 04/08/2021 No Yes exposed Z85.6 Personal history of leukemia 02/27/2021 No Yes Inactive Problems Resolved Problems Electronic Signature(s) Signed: 04/08/2021 5:14:14 PM By: Worthy Keeler PA-C Previous Signature: 04/08/2021 11:32:10 AM Version By: Worthy Keeler PA-C Entered By: Worthy Keeler on 04/08/2021 17:14:14 [name] ([id number]) -------------------------------------------------------------------------------- Progress Note Details Patient Name: [name]. Date of Service: 04/08/2021 11:15 AM Medical Record Number: [id number] Patient Account Number: [id number] Date of Birth/Sex: 06-15-1940 (81 y.o. M) Treating RN: Levora Dredge Primary Care Provider: Elsie Stain Other Clinician: Referring Provider: Elsie Stain Treating Provider/Extender: Skipper Cliche in Treatment:  5 Subjective Chief Complaint Information obtained from Patient Left LE Ulcer History of Present Illness (HPI) 02/27/2021 upon evaluation today patient presents for initial evaluation here in the clinic sent concerning an issue on the left lateral heel which has been present currently for about 3 weeks that he notes. With that being said he did have an issue where he hit this area on a lawnmower over the summer 2022. Nonetheless the patient tells me that unfortunately though it did not seem to give him a lot of trouble immediately following this reopened more recently. He has not had any x-rays up to this time. 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 Subsequently he did have a fairly good arterial test today here in the clinic measured at 1.03 but I am somewhat cautious with that due to the fact that his extremity is very cold nonetheless he seems to have pretty good blood flow of the wound when I am cleaning this today. We will keep an eye on this and if I need to send him for formal arterial testing I will do so. 03/13/2021 upon evaluation today patient appears to be doing well with regard to his wound. He has been tolerating the dressing changes without complication. Fortunately he does not show any signs of active infection at this time which is great news. No fevers, chills, nausea, vomiting, or diarrhea. With that being said his x-ray was negative for any signs of obvious osteomyelitis obviously an x-ray is not the best test for this but an MRI would be. Nonetheless I do not think that we need to jump straight to that at this point. 03/27/2021 upon evaluation today patient actually appears to be doing extremely well in regard to his wounds. He has  been tolerating the dressing changes on the left lateral ankle. I am actually extremely pleased with how this appears and I think that he is making excellent progress. Fortunately I do not see any evidence of active infection at this time which is great news. 04/03/2021 upon evaluation today patient appears to be doing well with regard to his wound. Has been tolerating the dressing changes without complication. Fortunately there is no sign of active infection locally or systemically which is great news. No fevers, chills, nausea, vomiting, or diarrhea. 04/08/2021 upon evaluation today patient appears to be doing poorly today in regard to his right heel which unfortunately has an open wound that was not present at the last evaluation. There does not appear to be any signs of active infection locally or systemically at this time which is great news. No fevers, chills, nausea, vomiting, or diarrhea. Objective Constitutional Well-nourished and well-hydrated in no acute distress. Vitals Time Taken: 11:45 AM, Height: 76 in, Weight: 200 lbs, BMI: 24.3, Temperature: 98.1 F, Pulse: 78 bpm, Respiratory Rate: 18 breaths/min, Blood Pressure: 135/80 mmHg. Respiratory normal breathing without difficulty. Psychiatric this patient is able to make decisions and demonstrates good insight into disease process. Alert and Oriented x 3. pleasant and cooperative. General Notes: Upon inspection patient's wound bed showed evidence of good granulation and epithelization for the most part. I do believe he is making progress in regard to the left heel. In regard to the right heel this is unfortunate but nonetheless I did have to perform debridement this not a whole lot going on here that should keep this from healing appropriately I hope. Integumentary (Hair, Skin) Wound #1 status is Open. Original cause of wound was Trauma. The date acquired was: 02/07/2021. The wound has been in treatment 5 weeks. The wound is located on  the Left,Lateral Ankle. The wound measures 1.1cm length x 1.1cm width x 0.4cm depth; 0.95cm^2 area and 0.38cm^3 HALLIE, ISHIDA. ([id number]) volume. There is tendon and Fat Layer (Subcutaneous Tissue) exposed. There is no tunneling or undermining noted. There is a large amount of serous drainage noted. The wound margin is flat and intact. There is large (67-100%) red, hyper - granulation within the wound bed. There is a small (1-33%) amount of necrotic tissue within  the wound bed. Wound #2 status is Open. Original cause of wound was Gradually Appeared. The date acquired was: 04/05/2021. The wound is located on the Right Calcaneus. The wound measures 0.5cm length x 0.9cm width x 0.1cm depth; 0.353cm^2 area and 0.035cm^3 volume. There is Fat Layer (Subcutaneous Tissue) exposed. There is no tunneling or undermining noted. There is a medium amount of serosanguineous drainage noted. The wound margin is distinct with the outline attached to the wound base. There is medium (34-66%) pink, pale granulation within the wound bed. There is a medium (34-66%) amount of necrotic tissue within the wound bed including Adherent Slough. 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 There was a Excisional Skin/Subcutaneous Tissue Debridement with a total area of 0.45 sq cm performed by Tommie Sams., PA-C. With the following instrument(s): Curette to remove Viable and Non-Viable tissue/material. Material removed includes Subcutaneous Tissue, Slough, Skin: Dermis, and Skin: Epidermis. No specimens were taken. A time out was conducted at 12:15, prior to the start of the procedure. A Minimum  amount of bleeding was controlled with Pressure. The procedure was tolerated well. Post Debridement Measurements: 0.5cm length x 0.9cm width x 0.1cm depth; 0.035cm^3 volume. Character of Wound/Ulcer Post Debridement is stable. Post procedure Diagnosis Wound #2: Same as Pre-Procedure Plan Follow-up Appointments: Wound #1 Left,Lateral Ankle: Return Appointment in 1 week. Nurse Visit as needed Bathing/ Shower/ Hygiene: Wound #1 Left,Lateral Ankle: May shower with wound dressing protected with water repellent cover or cast protector. No tub bath. Anesthetic (Use 'Patient Medications' Section for Anesthetic Order Entry): Lidocaine applied to wound bed Edema Control - Lymphedema / Segmental Compressive Device / Other: Elevate, Exercise Daily and Avoid Standing for Long Periods of Time. Elevate legs to the level of the heart and pump ankles as often as possible Elevate leg(s) parallel to the floor when sitting. Additional Orders / Instructions: Follow Nutritious Diet and Increase Protein Intake WOUND #1: - Ankle Wound Laterality: Left, Lateral Cleanser: Soap and Water 1 x Per Week/15 Days Discharge Instructions: Gently cleanse wound with antibacterial soap, rinse and pat dry prior to dressing wounds Primary Dressing: Prisma 4.34 (in) 1 x Per Week/15 Days Discharge Instructions: Moisten w/normal saline or sterile water; Cover wound as directed. Do not remove from wound bed. Secondary Dressing: (NON-BORDER) Zetuvit Plus Silicone NON-BORDER 5x5 (in/in) 1 x Per Week/15 Days Compression Wrap: 3-LAYER WRAP - Profore Lite LF 3 Multilayer Compression Bandaging System 1 x Per Week/15 Days Discharge Instructions: Apply 3 multi-layer wrap as prescribed. WOUND #2: - Calcaneus Wound Laterality: Right Cleanser: Soap and Water 3 x Per Week/15 Days Discharge Instructions: Gently cleanse wound with antibacterial soap, rinse and pat dry prior to dressing wounds Primary Dressing: Prisma 4.34 (in) (DME)  (Generic) 3 x Per Week/15 Days Discharge Instructions: Moisten w/normal saline or sterile water; Cover wound as directed. Do not remove from wound bed. ELFEGO, GIAMMARINO ([id number]) Secondary Dressing: (SILICONE BORDER) Zetuvit Plus SILICONE BORDER Dressing 4x4 (in/in) (DME) (Generic) 3 x Per Week/15 Days Discharge Instructions: Please do not put silicone bordered dressings under wraps. Use non-bordered dressing only. 1. Would recommend currently that we going continue with the wound care measures as before and the patient is in agreement with plan. This  includes the use of the silver collagen dressing which I think is good to be good for both wound areas. On the right side begin using a border foam dressing to cover. 2. I am also can recommend Tubigrip on the right and on the left be using a 3 layer compression wrap. We will see patient back for reevaluation in 1 week here in the clinic. If anything worsens or changes patient will contact our office for additional recommendations. Electronic Signature(s) Signed: 04/08/2021 5:17:35 PM By: Worthy Keeler PA-C Previous Signature: 04/08/2021 5:12:08 PM Version By: Worthy Keeler PA-C Entered By: Worthy Keeler on 04/08/2021 17:17:35 [name] ([id number]) -------------------------------------------------------------------------------- SuperBill Details Patient Name: CORTLAN, DOLIN. Date of Service: 04/08/2021 Medical Record Number: [id number] Patient Account Number: [id number] Date of Birth/Sex: 05-07-1940 (81 y.o. 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 L97.412 Non-pressure chronic ulcer of right heel and  midfoot with fat layer exposed Z85.6 Personal history of leukemia Facility Procedures CPT4 Code: [id number] Description: 04888 - DEB SUBQ TISSUE 20 SQ CM/< Modifier: Quantity: 1 CPT4 Code: Description: ICD-10 Diagnosis Description L97.412 Non-pressure chronic ulcer of right heel and midfoot with fat layer ex Modifier: posed Quantity: Physician Procedures CPT4 Code: 9169450 Description: 38882 - WC PHYS LEVEL 4 - EST PT Modifier: 25 Quantity: 1 CPT4 Code: Description: ICD-10 Diagnosis Description 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 L97.422 Non-pressure chronic ulcer of left heel and midfoot with fat layer exp Z85.6 Personal history of leukemia L97.412 Non-pressure chronic ulcer of  right heel and midfoot with fat layer ex Modifier: osed posed Quantity: CPT4 Code: 8003491 Description: 11042 - WC PHYS SUBQ TISS 20 SQ CM Modifier: Quantity: 1 CPT4 Code: Description: ICD-10 Diagnosis Description L97.412 Non-pressure chronic ulcer of right heel and midfoot with fat layer ex Modifier: posed Quantity: Electronic Signature(s) Signed: 04/08/2021 5:17:56 PM By: Worthy Keeler PA-C Previous Signature: 04/08/2021 5:12:35 PM Version By: Worthy Keeler PA-C Entered By: Worthy Keeler on 04/08/2021 17:17:56

## 2021-04-08 NOTE — Progress Notes (Addendum)
AVERY, KLINGBEIL ([id number]) Visit Report for 04/08/2021 Arrival Information Details Patient Name: [name], [name]. Date of Service: 04/08/2021 11:15 AM Medical Record Number: [id number] Patient Account Number: [id number] Date of Birth/Sex: 1940/05/21 (81 y.o. M) Treating RN: Levora Dredge Primary Care Dustine Stickler: Elsie Stain Other Clinician: Referring Nilson Tabora: Elsie Stain Treating Annalucia Laino/Extender: Skipper Cliche in Treatment: 5 Visit Information History Since Last Visit Added or deleted any medications: No Patient Arrived: Wheel Chair Any new allergies or adverse reactions: No Arrival Time: 11:43 Hospitalized since last visit: No Accompanied By: son Has Dressing in Place as Prescribed: Yes Transfer Assistance: EasyPivot Patient Lift Pain Present Now: No Patient Identification Verified: Yes Secondary Verification Process Completed: Yes Patient Requires Transmission-Based No Precautions: Patient Has Alerts: Yes Patient Alerts: Patient on Blood Thinner 81MG x 2 NOT DIABETIC Electronic Signature(s) Signed: 04/08/2021 5:07:45 PM By: Levora Dredge Entered By: Levora Dredge on 04/08/2021 11:48:56 [name] ([id number]) -------------------------------------------------------------------------------- Clinic Level of Care Assessment Details Patient Name: [name], [name]. Date of Service: 04/08/2021 11:15 AM Medical Record Number: [id number] Patient Account Number: [id number] Date of Birth/Sex: December 03, 1940 (81 y.o. M) Treating RN: Levora Dredge Primary Care Jadakiss Barish: Elsie Stain Other Clinician: Referring Bibi Economos: Elsie Stain Treating Ramanda Paules/Extender: Skipper Cliche in Treatment: 5 Clinic Level of Care Assessment Items TOOL 1 Quantity Score _0  - Use when EandM and Procedure is performed on INITIAL visit 0 ASSESSMENTS - Nursing Assessment / Reassessment _1  - General Physical Exam (combine w/ comprehensive assessment (listed just below) when performed  on new 0 pt. evals) _2  - 0 Comprehensive Assessment (HX, ROS, Risk Assessments, Wounds Hx, etc.) ASSESSMENTS - Wound and Skin Assessment / Reassessment _3  - Dermatologic / Skin Assessment (not related to wound area) 0 ASSESSMENTS - Ostomy and/or Continence Assessment and Care _4  - Incontinence Assessment and Management 0 _5  - 0 Ostomy Care Assessment and Management (repouching, etc.) PROCESS - Coordination of Care _6  - Simple Patient / Family Education for ongoing care 0 _7  - 0 Complex (extensive) Patient / Family Education for ongoing care _8  - 0 Staff obtains Programmer, systems, Records, Test Results / Process Orders _9  - 0 Staff telephones HHA, Nursing Homes / Clarify orders / etc _10  - 0 Routine Transfer to another Facility (non-emergent condition) _11  - 0 Routine Hospital Admission (non-emergent condition) _12  - 0 New Admissions / Biomedical engineer / Ordering NPWT, Apligraf, etc. _13  - 0 Emergency Hospital Admission (emergent condition) PROCESS - Special Needs _14  - Pediatric / Minor Patient Management 0 _15  - 0 Isolation Patient Management _16  - 0 Hearing / Language / Visual special needs _17  - 0 Assessment of Community assistance (transportation, D/C planning, etc.) _18  - 0 Additional assistance / Altered mentation _19  - 0 Support Surface(s) Assessment (bed, cushion, seat, etc.) INTERVENTIONS - Miscellaneous _20  - External ear exam 0 _21  - 0 Patient Transfer (multiple staff / Civil Service fast streamer / Similar devices) _22  - 0 Simple Staple / Suture removal (25 or less) _23  - 0 Complex Staple / Suture removal (26 or more) _24  - 0 Hypo/Hyperglycemic Management (do not check if billed separately) _25  - 0 Ankle / Brachial Index (ABI) - do not check if billed separately Has the patient been seen at the hospital within the last three years: Yes Total Score: 0 Level Of Care: ____ [name] ([id number]) Electronic Signature(s) Signed: 04/08/2021 5:07:45 PM By: Levora Dredge Entered By:  Levora Dredge on 04/08/2021 12:51:20 [name] ([id number]) -------------------------------------------------------------------------------- Encounter Discharge Information Details Patient Name: [name]. Date of Service: 04/08/2021  11:15 AM Medical Record Number: [id number] Patient Account Number: [id number] Date of Birth/Sex: Jun 25, 1940 (80 y.o. M) Treating RN: Levora Dredge Primary Care Jahlon Baines: Elsie Stain Other Clinician: Referring Classie Weng: Elsie Stain Treating Gizella Belleville/Extender: Skipper Cliche in Treatment: 5 Encounter Discharge Information Items Post Procedure Vitals Discharge Condition: Stable Temperature (F): 98.1 Ambulatory Status: Wheelchair Pulse (bpm): 78 Discharge Destination: Home Respiratory Rate (breaths/min): 18 Transportation: Private Auto Blood Pressure (mmHg): 135/80 Accompanied By: son Schedule Follow-up Appointment: Yes Clinical Summary of Care: Electronic Signature(s) Signed: 04/08/2021 12:52:43 PM By: Levora Dredge Entered By: Levora Dredge on 04/08/2021 12:52:43 LITTLE, WINTON ([id number]) -------------------------------------------------------------------------------- Lower Extremity Assessment Details Patient Name: DMAURI, ROSENOW. Date of Service: 04/08/2021 11:15 AM Medical Record Number: [id number] Patient Account Number: [id number] Date of Birth/Sex: 1941/01/25 (81 y.o. M) Treating RN: Levora Dredge Primary Care Deanglo Hissong: Elsie Stain Other Clinician: Referring Mehek Grega: Elsie Stain Treating Yuma Blucher/Extender: Jeri Cos Weeks in Treatment: 5 Edema Assessment Assessed: [Left: No] [Right: No] Edema: [Left: Yes] [Right: Yes] Calf Left: Right: Point of Measurement: 36 cm From Medial Instep 35 cm 38.5 cm Ankle Left: Right: Point of Measurement: 15 cm From Medial Instep 26 cm 27.8 cm Vascular Assessment Pulses: Dorsalis Pedis Palpable: [Left:Yes] [Right:Yes] Electronic Signature(s) Signed: 04/08/2021  5:07:45 PM By: Levora Dredge Entered By: Levora Dredge on 04/08/2021 12:00:49 MONTERRIO, GERST ([id number]) -------------------------------------------------------------------------------- Multi Wound Chart Details Patient Name: [name]. Date of Service: 04/08/2021 11:15 AM Medical Record Number: [id number] Patient Account Number: [id number] Date of Birth/Sex: March 08, 1940 (81 y.o. M) Treating RN: Levora Dredge Primary Care Daveigh Batty: Elsie Stain Other Clinician: Referring Demika Langenderfer: Elsie Stain Treating Stancil Deisher/Extender: Skipper Cliche in Treatment: 5 Vital Signs Height(in): 76 Pulse(bpm): 78 Weight(lbs): 200 Blood Pressure(mmHg): 135/80 Body Mass Index(BMI): 24.3 Temperature(F): 98.1 Respiratory Rate(breaths/min): 18 Photos: [N/A:N/A] Wound Location: Left, Lateral Ankle Right Calcaneus N/A Wounding Event: Trauma Gradually Appeared N/A Primary Etiology: Neuropathic Ulcer-Non Diabetic To be determined N/A Comorbid History: Cataracts, Neuropathy Cataracts, Neuropathy N/A Date Acquired: 02/07/2021 04/05/2021 N/A Weeks of Treatment: 5 0 N/A Wound Status: Open Open N/A Wound Recurrence: No No N/A Measurements L x W x D (cm) 1.1x1.1x0.4 0.5x0.9x0.1 N/A Area (cm) : 0.95 0.353 N/A Volume (cm) : 0.38 0.035 N/A % Reduction in Area: 44.00% N/A N/A % Reduction in Volume: 55.20% N/A N/A Classification: Full Thickness With Exposed Full Thickness Without Exposed N/A Support Structures Support Structures Exudate Amount: Large Medium N/A Exudate Type: Serous Serosanguineous N/A Exudate Color: amber red, brown N/A Wound Margin: Flat and Intact N/A N/A Granulation Amount: Large (67-100%) Medium (34-66%) N/A Granulation Quality: Red, Hyper-granulation Pink, Pale N/A Necrotic Amount: Small (1-33%) Medium (34-66%) N/A Exposed Structures: Fat Layer (Subcutaneous Tissue): Fat Layer (Subcutaneous Tissue): N/A Yes Yes Tendon: Yes Fascia: No Muscle: No Joint: No Bone:  No Epithelialization: Small (1-33%) None N/A Debridement: N/A Debridement - Excisional N/A Pre-procedure Verification/Time N/A 12:15 N/A Out Taken: Tissue Debrided: N/A Subcutaneous, Slough N/A Level: N/A Skin/Subcutaneous Tissue N/A Debridement Area (sq cm): N/A 0.45 N/A Instrument: N/A Curette N/A Bleeding: N/A Minimum N/A Hemostasis Achieved: N/A Pressure N/A Debridement Treatment N/A Procedure was tolerated well N/A Response: N/A 0.5x0.9x0.1 N/A DAMIONE, ROBIDEAU ([id number]) Post Debridement Measurements L x W x D (cm) Post Debridement Volume: N/A 0.035 N/A (cm) Procedures Performed: N/A Debridement N/A Treatment Notes Electronic Signature(s) Signed: 04/08/2021 12:51:08 PM By: Levora Dredge Entered By: Levora Dredge on 04/08/2021 12:51:07 [name] ([id number]) -------------------------------------------------------------------------------- Multi-Disciplinary Care Plan Details Patient Name: THELMER, LEGLER. Date of Service: 04/08/2021 11:15 AM Medical Record  Number: [id number] Patient Account Number: [id number] Date of Birth/Sex: 04-01-1940 (80 y.o. M) Treating RN: Levora Dredge Primary Care Eliane Hammersmith: Elsie Stain Other Clinician: Referring Jany Buckwalter: Elsie Stain Treating Zameria Vogl/Extender: Skipper Cliche in Treatment: 5 Active Inactive Wound/Skin Impairment Nursing Diagnoses: Impaired tissue integrity Knowledge deficit related to smoking impact on wound healing Knowledge deficit related to ulceration/compromised skin integrity Goals: Patient/caregiver will verbalize understanding of skin care regimen Date Initiated: 02/27/2021 Date Inactivated: 03/27/2021 Target Resolution Date: 02/27/2021 Goal Status: Met Ulcer/skin breakdown will have a volume reduction of 30% by week 4 Date Initiated: 02/27/2021 Target Resolution Date: 03/27/2021 Goal Status: Active Ulcer/skin breakdown will have a volume reduction of 50% by week 8 Date Initiated:  02/27/2021 Target Resolution Date: 04/24/2021 Goal Status: Active Interventions: Assess ulceration(s) every visit Treatment Activities: Skin care regimen initiated : 02/27/2021 Topical wound management initiated : 02/27/2021 Notes: Electronic Signature(s) Signed: 04/08/2021 12:50:57 PM By: Levora Dredge Entered By: Levora Dredge on 04/08/2021 12:50:56 GWEN, EDLER ([id number]) -------------------------------------------------------------------------------- Pain Assessment Details Patient Name: [name]. Date of Service: 04/08/2021 11:15 AM Medical Record Number: [id number] Patient Account Number: [id number] Date of Birth/Sex: 1940-08-10 (81 y.o. M) Treating RN: Levora Dredge Primary Care Denika Krone: Elsie Stain Other Clinician: Referring Sherea Liptak: Elsie Stain Treating Lashaye Fisk/Extender: Skipper Cliche in Treatment: 5 Active Problems Location of Pain Severity and Description of Pain Patient Has Paino No Site Locations Rate the pain. Current Pain Level: 0 Pain Management and Medication Current Pain Management: Electronic Signature(s) Signed: 04/08/2021 5:07:45 PM By: Levora Dredge Entered By: Levora Dredge on 04/08/2021 11:49:25 CORTNEY, MCKINNEY ([id number]) -------------------------------------------------------------------------------- Patient/Caregiver Education Details Patient Name: YOUSAF, SAINATO. Date of Service: 04/08/2021 11:15 AM Medical Record Number: [id number] Patient Account Number: [id number] Date of Birth/Gender: 01-13-1941 (81 y.o. M) Treating RN: Levora Dredge Primary Care Physician: Elsie Stain Other Clinician: Referring Physician: Elsie Stain Treating Physician/Extender: Skipper Cliche in Treatment: 5 Education Assessment Education Provided To: Patient and Caregiver Education Topics Provided Wound/Skin Impairment: Handouts: Caring for Your Ulcer Methods: Explain/Verbal Responses: State content correctly Electronic  Signature(s) Signed: 04/08/2021 5:07:45 PM By: Levora Dredge Entered By: Levora Dredge on 04/08/2021 12:51:38 FLYNT, BREEZE ([id number]) -------------------------------------------------------------------------------- Wound Assessment Details Patient Name: COPPER, BASNETT. Date of Service: 04/08/2021 11:15 AM Medical Record Number: [id number] Patient Account Number: [id number] Date of Birth/Sex: [birth year] (81 y.o. M) Treating RN: Levora Dredge Primary Care Shad Ledvina: Elsie Stain Other Clinician: Referring Alisse Tuite: Elsie Stain Treating Yanina Knupp/Extender: Jeri Cos Weeks in Treatment: 5 Wound Status Wound Number: 1 Primary Etiology: Neuropathic Ulcer-Non Diabetic Wound Location: Left, Lateral Ankle Wound Status: Open Wounding Event: Trauma Comorbid History: Cataracts, Neuropathy Date Acquired: 02/07/2021 Weeks Of Treatment: 5 Clustered Wound: No Photos Wound Measurements Length: (cm) 1.1 Width: (cm) 1.1 Depth: (cm) 0.4 Area: (cm) 0.95 Volume: (cm) 0.38 % Reduction in Area: 44% % Reduction in Volume: 55.2% Epithelialization: Small (1-33%) Tunneling: No Undermining: No Wound Description Classification: Full Thickness With Exposed Support Structures Wound Margin: Flat and Intact Exudate Amount: Large Exudate Type: Serous Exudate Color: amber Foul Odor After Cleansing: No Slough/Fibrino Yes Wound Bed Granulation Amount: Large (67-100%) Exposed Structure Granulation Quality: Red, Hyper-granulation Fascia Exposed: No Necrotic Amount: Small (1-33%) Fat Layer (Subcutaneous Tissue) Exposed: Yes Tendon Exposed: Yes Muscle Exposed: No Joint Exposed: No Bone Exposed: No Treatment Notes Wound #1 (Ankle) Wound Laterality: Left, Lateral Cleanser Soap and Water Discharge Instruction: Gently cleanse wound with antibacterial soap, rinse and pat dry prior to dressing wounds Peri-Wound Care ALTAN, KRAAI ([id number]) Topical Primary Dressing Prisma 4.34  (  in) Discharge Instruction: Moisten w/normal saline or sterile water; Cover wound as directed. Do not remove from wound bed. Secondary Dressing (NON-BORDER) Zetuvit Plus Silicone NON-BORDER 5x5 (in/in) Secured With Compression Wrap 3-LAYER WRAP - Profore Lite LF 3 Multilayer Compression Bandaging System Discharge Instruction: Apply 3 multi-layer wrap as prescribed. Compression Stockings Add-Ons Electronic Signature(s) Signed: 04/08/2021 5:07:45 PM By: Levora Dredge Entered By: Levora Dredge on 04/08/2021 12:04:17 PETER, KEYWORTH ([id number]) -------------------------------------------------------------------------------- Wound Assessment Details Patient Name: ARICK, MARENO. Date of Service: 04/08/2021 11:15 AM Medical Record Number: [id number] Patient Account Number: [id number] Date of Birth/Sex: Sep 12, 1940 (81 y.o. M) Treating RN: Levora Dredge Primary Care Jack Mineau: Elsie Stain Other Clinician: Referring Vernell Back: Elsie Stain Treating Devonna Oboyle/Extender: Jeri Cos Weeks in Treatment: 5 Wound Status Wound Number: 2 Primary Etiology: Neuropathic Ulcer-Non Diabetic Wound Location: Right Calcaneus Wound Status: Open Wounding Event: Gradually Appeared Comorbid History: Cataracts, Neuropathy Date Acquired: 04/05/2021 Weeks Of Treatment: 0 Clustered Wound: No Photos Wound Measurements Length: (cm) 0.5 Width: (cm) 0.9 Depth: (cm) 0.1 Area: (cm) 0.353 Volume: (cm) 0.035 % Reduction in Area: 0% % Reduction in Volume: 0% Epithelialization: None Tunneling: No Undermining: No Wound Description Classification: Full Thickness Without Exposed Support Structures Wound Margin: Distinct, outline attached Exudate Amount: Medium Exudate Type: Serosanguineous Exudate Color: red, brown Foul Odor After Cleansing: No Slough/Fibrino Yes Wound Bed Granulation Amount: Medium (34-66%) Exposed Structure Granulation Quality: Pink, Pale Fat Layer (Subcutaneous Tissue) Exposed:  Yes Necrotic Amount: Medium (34-66%) Necrotic Quality: Adherent Slough Treatment Notes Wound #2 (Calcaneus) Wound Laterality: Right Cleanser Soap and Water Discharge Instruction: Gently cleanse wound with antibacterial soap, rinse and pat dry prior to dressing wounds Peri-Wound Care Topical MARCELO, ICKES ([id number]) Primary Dressing Prisma 4.34 (in) Discharge Instruction: Moisten w/normal saline or sterile water; Cover wound as directed. Do not remove from wound bed. Secondary Dressing (SILICONE BORDER) Zetuvit Plus SILICONE BORDER Dressing 4x4 (in/in) Discharge Instruction: Please do not put silicone bordered dressings under wraps. Use non-bordered dressing only. Secured With Compression Wrap Compression Stockings Add-Ons Electronic Signature(s) Signed: 04/08/2021 5:17:07 PM By: Worthy Keeler PA-C Signed: 04/09/2021 3:52:56 PM By: Levora Dredge Previous Signature: 04/08/2021 5:07:45 PM Version By: Levora Dredge Entered By: Worthy Keeler on 04/08/2021 17:17:07 GORO, WENRICK ([id number]) -------------------------------------------------------------------------------- Vitals Details Patient Name: ANUP, BRIGHAM. Date of Service: 04/08/2021 11:15 AM Medical Record Number: [id number] Patient Account Number: [id number] Date of Birth/Sex: 17-Jul-1940 (81 y.o. M) Treating RN: Levora Dredge Primary Care Zoelle Markus: Elsie Stain Other Clinician: Referring Perris Conwell: Elsie Stain Treating Shelbe Haglund/Extender: Skipper Cliche in Treatment: 5 Vital Signs Time Taken: 11:45 Temperature (F): 98.1 Height (in): 76 Pulse (bpm): 78 Weight (lbs): 200 Respiratory Rate (breaths/min): 18 Body Mass Index (BMI): 24.3 Blood Pressure (mmHg): 135/80 Reference Range: 80 - 120 mg / dl Electronic Signature(s) Signed: 04/08/2021 5:07:45 PM By: Levora Dredge Entered By: Levora Dredge on 04/08/2021 11:49:17

## 2021-04-17 ENCOUNTER — Other Ambulatory Visit: Payer: Self-pay

## 2021-04-17 ENCOUNTER — Encounter: Payer: Medicare PPO | Admitting: Physician Assistant

## 2021-04-17 DIAGNOSIS — L97312 Non-pressure chronic ulcer of right ankle with fat layer exposed: Secondary | ICD-10-CM | POA: Diagnosis not present

## 2021-04-17 DIAGNOSIS — L97412 Non-pressure chronic ulcer of right heel and midfoot with fat layer exposed: Secondary | ICD-10-CM | POA: Diagnosis not present

## 2021-04-17 DIAGNOSIS — L97422 Non-pressure chronic ulcer of left heel and midfoot with fat layer exposed: Secondary | ICD-10-CM | POA: Diagnosis not present

## 2021-04-17 DIAGNOSIS — Z856 Personal history of leukemia: Secondary | ICD-10-CM | POA: Diagnosis not present

## 2021-04-17 DIAGNOSIS — G9009 Other idiopathic peripheral autonomic neuropathy: Secondary | ICD-10-CM | POA: Diagnosis not present

## 2021-04-17 NOTE — Progress Notes (Signed)
[name], [name] ([id number]) ?Visit Report for 04/17/2021 ?Arrival Information Details ?Patient Name: [name], [name] ?Date of Service: 04/17/2021 11:30 AM ?Medical Record Number: [id number] ?Patient Account Number: [id number] ?Date of Birth/Sex: 06-26-1940 (81 y.o. M) ?Treating RN: Donnamarie Poag ?Primary Care Sakura Denis: Elsie Stain Other Clinician: ?Referring Arijana Narayan: Elsie Stain ?Treating Hilery Wintle/Extender: Jeri Cos ?Weeks in Treatment: 7 ?Visit Information History Since Last Visit ?Added or deleted any medications: No ?Patient Arrived: Wheel Chair ?Had a fall or experienced change in No ?Arrival Time: 11:39 ?activities of daily living that may affect ?Accompanied By: son ?risk of falls: ?Transfer Assistance: [employer] Patient Lift ?Hospitalized since last visit: No ?Patient Identification Verified: Yes ?Has Dressing in Place as Prescribed: Yes ?Secondary Verification Process Completed: Yes ?Has Compression in Place as Prescribed: Yes ?Patient Requires Transmission-Based No ?Pain Present Now: No ?Precautions: ?Patient Has Alerts: Yes ?Patient Alerts: Patient on Blood ?Thinner ?81MG x 2 ?NOT DIABETIC ?Electronic Signature(s) ?Signed: 04/17/2021 3:44:29 PM By: Donnamarie Poag ?Entered ByDonnamarie Poag on 04/17/2021 11:39:59 ?[name], [name] ([id number]) ?-------------------------------------------------------------------------------- ?Compression Therapy Details ?Patient Name: [name] ?Date of Service: 04/17/2021 11:30 AM ?Medical Record Number: [id number] ?Patient Account Number: [id number] ?Date of Birth/Sex: [birth year] (81 y.o. M) ?Treating RN: Donnamarie Poag ?Primary Care Lionell Matuszak: Elsie Stain Other Clinician: ?Referring Uno Esau: Elsie Stain ?Treating Cressida Milford/Extender: Jeri Cos ?Weeks in Treatment: 7 ?Compression Therapy Performed for Wound Assessment: Wound #1 Left,Lateral Ankle ?Performed By: Clinician Donnamarie Poag, RN ?Compression Type: Three Layer ?Post Procedure Diagnosis ?Same as  Pre-procedure ?Electronic Signature(s) ?Signed: 04/17/2021 3:18:28 PM By: Donnamarie Poag ?Entered ByDonnamarie Poag on 04/17/2021 15:18:28 ?[name], [name] ([id number]) ?-------------------------------------------------------------------------------- ?Encounter Discharge Information Details ?Patient Name: [name], [name] ?Date of Service: 04/17/2021 11:30 AM ?Medical Record Number: [id number] ?Patient Account Number: [id number] ?Date of Birth/Sex: January 27, 1941 (81 y.o. M) ?Treating RN: Donnamarie Poag ?Primary Care Savina Olshefski: Elsie Stain Other Clinician: ?Referring Theodoro Koval: Elsie Stain ?Treating Mckaylin Bastien/Extender: Jeri Cos ?Weeks in Treatment: 7 ?Encounter Discharge Information Items ?Discharge Condition: Stable ?Ambulatory Status: Wheelchair ?Discharge Destination: Home ?Transportation: Private Auto ?Accompanied By: son ?Schedule Follow-up Appointment: Yes ?Clinical Summary of Care: ?Electronic Signature(s) ?Signed: 04/17/2021 3:44:29 PM By: Donnamarie Poag ?Entered ByDonnamarie Poag on 04/17/2021 12:11:08 ?[name], [name] ([id number]) ?-------------------------------------------------------------------------------- ?Lower Extremity Assessment Details ?Patient Name: [name] ?Date of Service: 04/17/2021 11:30 AM ?Medical Record Number: [id number] ?Patient Account Number: [id number] ?Date of Birth/Sex: 1940-09-05 (81 y.o. M) ?Treating RN: Donnamarie Poag ?Primary Care Curry Seefeldt: Elsie Stain Other Clinician: ?Referring Beckett Maden: Elsie Stain ?Treating Brenten Janney/Extender: Jeri Cos ?Weeks in Treatment: 7 ?Edema Assessment ?Assessed: [Left: Yes] [Right: Yes] ?Edema: [Left: Yes] [Right: Yes] ?Calf ?Left: Right: ?Point of Measurement: 36 cm From Medial Instep 35 cm 35 cm ?Ankle ?Left: Right: ?Point of Measurement: 15 cm From Medial Instep 25 cm 26.5 cm ?Vascular Assessment ?Pulses: ?Dorsalis Pedis ?Doppler Audible: [Left:Yes] [Right:Yes] ?Electronic Signature(s) ?Signed: 04/17/2021 3:44:29 PM By: Donnamarie Poag ?Entered ByDonnamarie Poag on 04/17/2021 11:53:45 ?[name], [name] ([id number]) ?-------------------------------------------------------------------------------- ?Multi Wound Chart Details ?Patient Name: [name], [name] ?Date of Service: 04/17/2021 11:30 AM ?Medical Record Number: [id number] ?Patient Account Number: [id number] ?Date of Birth/Sex: March 25, 1940 (81 y.o. M) ?Treating RN: Donnamarie Poag ?Primary Care Coner Gibbard: Elsie Stain Other Clinician: ?Referring Jauan Wohl: Elsie Stain ?Treating Dilyn Smiles/Extender: Jeri Cos ?Weeks in Treatment: 7 ?Vital Signs ?Height(in): 76 ?Pulse(bpm): 75 ?Weight(lbs): 200 ?Blood Pressure(mmHg): 133/62 ?Body Mass Index(BMI): 24.3 ?Temperature(??F): 98.4 ?Respiratory Rate(breaths/min): 16 ?Photos: [N/A:N/A] ?Wound Location: Left, Lateral Ankle Right Calcaneus N/A ?Wounding Event: Trauma Gradually Appeared N/A ?Primary Etiology: Neuropathic Ulcer-Non Diabetic Neuropathic  Ulcer-Non Diabetic N/A ?Comorbid History: Cataracts, Neuropathy Cataracts, Neuropathy N/A ?Date Acquired: 02/07/2021 04/05/2021 N/A ?Weeks of Treatment: 7 1 N/A ?Wound Status: Open Open N/A ?Wound Recurrence: No No N/A ?Measurements L x W x D (cm) 0.8x1x0.3 0.7x0.9x0.1 N/A ?Area (cm?) : 0.628 0.495 N/A ?Volume (cm?) : 0.188 0.049 N/A ?% Reduction in Area: 63.00% -40.20% N/A ?% Reduction in Volume: 77.80% -40.00% N/A ?Classification: Full Thickness With Exposed Full Thickness Without Exposed N/A ?Support Structures Support Structures ?Exudate Amount: Large Medium N/A ?Exudate Type: Serous Serous N/A ?Exudate Color: amber amber N/A ?Wound Margin: Flat and Intact Distinct, outline attached N/A ?Granulation Amount: Large (67-100%) Medium (34-66%) N/A ?Granulation Quality: Red, Hyper-granulation Pink, Pale N/A ?Necrotic Amount: Small (1-33%) Medium (34-66%) N/A ?Exposed Structures: ?Fat Layer (Subcutaneous Tissue): ?Fat Layer (Subcutaneous Tissue): N/A ?Yes Yes ?Tendon: Yes ?Fascia: No ?Muscle: No ?Joint: No ?Bone: No ?Epithelialization:  Small (1-33%) None N/A ?Treatment Notes ?Electronic Signature(s) ?Signed: 04/17/2021 3:44:29 PM By: Donnamarie Poag ?Entered ByDonnamarie Poag on 04/17/2021 11:54:21 ?[name], [name] ([id number]) ?-------------------------------------------------------------------------------- ?Multi-Disciplinary Care Plan Details ?Patient Name: [name], [name] ?Date of Service: 04/17/2021 11:30 AM ?Medical Record Number: [id number] ?Patient Account Number: [id number] ?Date of Birth/Sex: [birth year] (81 y.o. M) ?Treating RN: Donnamarie Poag ?Primary Care Daquon Greenleaf: Elsie Stain Other Clinician: ?Referring Virginia Curl: Elsie Stain ?Treating Teeghan Hammer/Extender: Jeri Cos ?Weeks in Treatment: 7 ?Active Inactive ?Wound/Skin Impairment ?Nursing Diagnoses: ?Impaired tissue integrity ?Knowledge deficit related to smoking impact on wound healing ?Knowledge deficit related to ulceration/compromised skin integrity ?Goals: ?Patient/caregiver will verbalize understanding of skin care regimen ?Date Initiated: 02/27/2021 ?Date Inactivated: 03/27/2021 ?Target Resolution Date: 02/27/2021 ?Goal Status: Met ?Ulcer/skin breakdown will have a volume reduction of 30% by week 4 ?Date Initiated: 02/27/2021 ?Target Resolution Date: 03/27/2021 ?Goal Status: Active ?Ulcer/skin breakdown will have a volume reduction of 50% by week 8 ?Date Initiated: 02/27/2021 ?Target Resolution Date: 04/24/2021 ?Goal Status: Active ?Interventions: ?Assess ulceration(s) every visit ?Treatment Activities: ?Skin care regimen initiated : 02/27/2021 ?Topical wound management initiated : 02/27/2021 ?Notes: ?Electronic Signature(s) ?Signed: 04/17/2021 3:44:29 PM By: Donnamarie Poag ?Entered ByDonnamarie Poag on 04/17/2021 11:53:59 ?[name], [name] ([id number]) ?-------------------------------------------------------------------------------- ?Pain Assessment Details ?Patient Name: [name], [name] ?Date of Service: 04/17/2021 11:30 AM ?Medical Record Number: [id number] ?Patient Account Number: [id number] ?Date  of Birth/Sex: [birth year] (81 y.o. M) ?Treating RN: Donnamarie Poag ?Primary Care Aliani Caccavale: Elsie Stain Other Clinician: ?Referring Eriel Dunckel: Elsie Stain ?Treating Abigaile Rossie/Extender: Jeri Cos ?Weeks in Treatment: 7

## 2021-04-17 NOTE — Progress Notes (Addendum)
MAKENA, MCGRADY ([id number]) ?Visit Report for 04/17/2021 ?Chief Complaint Document Details ?Patient Name: [name], [name] ?Date of Service: 04/17/2021 11:30 AM ?Medical Record Number: [id number] ?Patient Account Number: [id number] ?Date of Birth/Sex: 12/28/1940 (81 y.o. M) ?Treating RN: Donnamarie Poag ?Primary Care Provider: Elsie Stain Other Clinician: ?Referring Provider: Elsie Stain ?Treating Provider/Extender: Jeri Cos ?Weeks in Treatment: 7 ?Information Obtained from: Patient ?Chief Complaint ?Left LE Ulcer ?Electronic Signature(s) ?Signed: 04/17/2021 11:54:59 AM By: Worthy Keeler PA-C ?Entered By: Worthy Keeler on 04/17/2021 11:54:58 ?[name], [name] ([id number]) ?-------------------------------------------------------------------------------- ?HPI Details ?Patient Name: [name], [name] ?Date of Service: 04/17/2021 11:30 AM ?Medical Record Number: [id number] ?Patient Account Number: [id number] ?Date of Birth/Sex: 01-15-1941 (81 y.o. M) ?Treating RN: Donnamarie Poag ?Primary Care Provider: Elsie Stain Other Clinician: ?Referring Provider: Elsie Stain ?Treating Provider/Extender: Jeri Cos ?Weeks in Treatment: 7 ?History of Present Illness ?HPI Description: 02/27/2021 upon evaluation today patient presents for initial evaluation here in the clinic sent concerning an issue on the left ?lateral heel which has been present currently for about 3 weeks that he notes. With that being said he did have an issue where he hit this area on ?a lawnmower over the summer 2022. Nonetheless the patient tells me that unfortunately though it did not seem to give him a lot of trouble ?immediately following this reopened more recently. He has not had any x-rays up to this time. 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 Subsequently he did have a fairly good ?arterial test today here in the clinic measured at 1.03 but I am somewhat cautious with that due to the fact that his extremity is very cold ?nonetheless he seems to have pretty good blood flow of the wound when I am cleaning this today. We will keep an eye on this and if I need to ?send him for formal arterial testing I will do so. ?03/13/2021 upon evaluation today patient appears to be doing well with regard to his wound. He has been tolerating the dressing changes without ?complication. Fortunately he does not show any signs of active infection at this time which is great news. No fevers, chills, nausea, vomiting, or ?diarrhea. With that being said his x-ray was negative for any signs of obvious osteomyelitis obviously an x-ray is not the best test for this but an ?MRI would be. Nonetheless I do not think that we need to jump straight to that at this point. ?03/27/2021 upon evaluation today patient actually appears to be doing extremely well in regard to his wounds. He has been tolerating the dressing ?changes on the left lateral ankle. I am actually extremely pleased with how this appears and I think that he is making excellent progress. ?Fortunately I do not see any evidence of active infection at this time which is great news. ?04/03/2021 upon evaluation today patient appears to be doing well with regard to his wound. Has been tolerating the dressing changes without ?complication. Fortunately there is no sign of active infection locally or systemically which is great news. No fevers, chills, nausea, vomiting, or ?diarrhea. ?04/08/2021 upon evaluation today patient appears to be doing poorly today in regard to his right heel which unfortunately has an open wound that ?was not present at the last evaluation. There  does not appear to be any signs of active infection locally or systemically at this time which is great ?news. No  fevers, chills, nausea, vomiting, or diarrhea. ?04/17/2021 upon evaluation patient appears to be doing well today in regard to his wound on both locations. I do feel like he is showing signs of ?improvement which is great news and overall I do feel like that were headed in the right direction reason silver collagen at both locations ?currently. ?Electronic Signature(s) ?Signed: 04/18/2021 7:54:35 AM By: Worthy Keeler PA-C ?Entered By: Worthy Keeler on 04/18/2021 07:54:34 ?[name], [name] ([id number]) ?-------------------------------------------------------------------------------- ?Physical Exam Details ?Patient Name: [name], [name] ?Date of Service: 04/17/2021 11:30 AM ?Medical Record Number: [id number] ?Patient Account Number: [id number] ?Date of Birth/Sex: January 09, 1941 (81 y.o. M) ?Treating RN: Donnamarie Poag ?Primary Care Provider: Elsie Stain Other Clinician: ?Referring Provider: Elsie Stain ?Treating Provider/Extender: Jeri Cos ?Weeks in Treatment: 7 ?Constitutional ?Well-nourished and well-hydrated in no acute distress. ?Respiratory ?normal breathing without difficulty. ?Psychiatric ?this patient is able to make decisions and demonstrates good insight into disease process. Alert and Oriented x 3. pleasant and cooperative. ?Notes ?Upon inspection patient's wound bed actually showed signs of good granulation and epithelization at this point. I do feel like he is doing quite well ?with the current wound care measures which is excellent news. This includes the use of the silver collagen which is I think still a good choice here. ?Electronic Signature(s) ?Signed: 04/18/2021 7:55:06 AM By: Worthy Keeler PA-C ?Entered By: Worthy Keeler on 04/18/2021 07:55:06 ?[name], [name] ([id number]) ?-------------------------------------------------------------------------------- ?Physician Orders Details ?Patient Name: [name], [name] ?Date of Service: 04/17/2021 11:30 AM ?Medical Record Number: [id number] ?Patient  Account Number: [id number] ?Date of Birth/Sex: 1940/02/25 (81 y.o. 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 ?o Nurse Visit as needed ?Bathing/ Shower/ Hygiene ?Wound #1 Left,Lateral Ankle ?o May shower with wound dressing protected with water repellent cover or cast protector. ?o No tub bath. ?Anesthetic (Use 'Patient Medications' Section for Anesthetic Order Entry) ?o Lidocaine applied to wound bed ?Edema Control - Lymphedema / Segmental Compressive Device / Other ?o Elevate, Exercise Daily and Avoid Standing for Long Periods of Time. ?o Elevate legs to the level of the heart and pump ankles as often as possible ?o Elevate leg(s) parallel to the floor when sitting. ?Additional Orders / Instructions ?o Follow Nutritious Diet and Increase Protein Intake ?Wound Treatment ?Wound #1 - Ankle Wound Laterality: Left, Lateral ?Cleanser: Soap and Water 1 x Per Week/15 Days ?Discharge Instructions: Gently cleanse wound with antibacterial soap, rinse and pat dry prior to dressing wounds ?Primary Dressing: Prisma 4.34 (in) 1 x Per Week/15 Days ?Discharge Instructions: Moisten w/normal saline or sterile water; Cover wound as directed. Do not remove from wound bed. ?Secondary Dressing: (NON-BORDER) Zetuvit Plus Silicone NON-BORDER 5x5 (in/in) 1 x Per Week/15 Days ?Compression Wrap: 3-LAYER WRAP - Profore Lite LF 3 Multilayer Compression Bandaging System 1  x Per Week/15 Days ?Discharge Instructions: Apply 3 multi-layer wrap as prescribed. ?Wound #2 - Calcaneus Wound Laterality: Right ?Cleanser: Soap and  Water 3 x Per Week/15 Days ?Discharge Instructions: Norva Karvonen

## 2021-04-24 ENCOUNTER — Encounter: Payer: Medicare PPO | Admitting: Physician Assistant

## 2021-04-24 ENCOUNTER — Other Ambulatory Visit: Payer: Self-pay

## 2021-04-24 DIAGNOSIS — L97412 Non-pressure chronic ulcer of right heel and midfoot with fat layer exposed: Secondary | ICD-10-CM | POA: Diagnosis not present

## 2021-04-24 DIAGNOSIS — L97325 Non-pressure chronic ulcer of left ankle with muscle involvement without evidence of necrosis: Secondary | ICD-10-CM | POA: Diagnosis not present

## 2021-04-24 DIAGNOSIS — Z856 Personal history of leukemia: Secondary | ICD-10-CM | POA: Diagnosis not present

## 2021-04-24 DIAGNOSIS — G9009 Other idiopathic peripheral autonomic neuropathy: Secondary | ICD-10-CM | POA: Diagnosis not present

## 2021-04-24 DIAGNOSIS — L97322 Non-pressure chronic ulcer of left ankle with fat layer exposed: Secondary | ICD-10-CM | POA: Diagnosis not present

## 2021-04-24 DIAGNOSIS — L97422 Non-pressure chronic ulcer of left heel and midfoot with fat layer exposed: Secondary | ICD-10-CM | POA: Diagnosis not present

## 2021-04-24 NOTE — Progress Notes (Signed)
[name] ([id number]) ?Visit Report for 04/24/2021 ?Arrival Information Details ?Patient Name: [name], [name] ?Date of Service: 04/24/2021 11:30 AM ?Medical Record Number: [id number] ?Patient Account Number: [id number] ?Date of Birth/Sex: 12/25/1940 (81 y.o. M) ?Treating RN: Donnamarie Poag ?Primary Care Godric Lavell: Elsie Stain Other Clinician: ?Referring Leilany Digeronimo: Elsie Stain ?Treating Jesse Nosbisch/Extender: Jeri Cos ?Weeks in Treatment: 8 ?Visit Information History Since Last Visit ?Added or deleted any medications: No ?Patient Arrived: Wheel Chair ?Had a fall or experienced change in No ?Arrival Time: 11:32 ?activities of daily living that may affect ?Accompanied By: son ?risk of falls: ?Transfer Assistance: [employer] Patient Lift ?Hospitalized since last visit: No ?Patient Identification Verified: Yes ?Has Dressing in Place as Prescribed: Yes ?Secondary Verification Process Completed: Yes ?Has Compression in Place as Prescribed: Yes ?Patient Requires Transmission-Based No ?Pain Present Now: No ?Precautions: ?Patient Has Alerts: Yes ?Patient Alerts: Patient on Blood ?Thinner ?81MG x 2 ?NOT DIABETIC ?Electronic Signature(s) ?Signed: 04/24/2021 4:16:55 PM By: Donnamarie Poag ?Entered ByDonnamarie Poag on 04/24/2021 11:34:48 ?NEIL, BRICKELL ([id number]) ?-------------------------------------------------------------------------------- ?Compression Therapy Details ?Patient Name: [name] ?Date of Service: 04/24/2021 11:30 AM ?Medical Record Number: [id number] ?Patient Account Number: [id number] ?Date of Birth/Sex: [birth year] (81 y.o. M) ?Treating RN: Donnamarie Poag ?Primary Care Roizy Harold: Elsie Stain Other Clinician: ?Referring Kammie Scioli: Elsie Stain ?Treating Cole Eastridge/Extender: Jeri Cos ?Weeks in Treatment: 8 ?Compression Therapy Performed for Wound Assessment: Wound #1 Left,Lateral Ankle ?Performed By: Clinician Donnamarie Poag, RN ?Compression Type: Three Layer ?Post Procedure Diagnosis ?Same as  Pre-procedure ?Electronic Signature(s) ?Signed: 04/24/2021 4:16:55 PM By: Donnamarie Poag ?Entered ByDonnamarie Poag on 04/24/2021 11:50:18 ?STRIDER, VALLANCE ([id number]) ?-------------------------------------------------------------------------------- ?Encounter Discharge Information Details ?Patient Name: [name] ?Date of Service: 04/24/2021 11:30 AM ?Medical Record Number: [id number] ?Patient Account Number: [id number] ?Date of Birth/Sex: Nov 24, 1940 (81 y.o. M) ?Treating RN: Donnamarie Poag ?Primary Care Azelie Noguera: Elsie Stain Other Clinician: ?Referring Jode Lippe: Elsie Stain ?Treating Jamika Sadek/Extender: Jeri Cos ?Weeks in Treatment: 8 ?Encounter Discharge Information Items ?Discharge Condition: Stable ?Ambulatory Status: Wheelchair ?Discharge Destination: Home ?Transportation: Private Auto ?Accompanied By: son ?Schedule Follow-up Appointment: Yes ?Clinical Summary of Care: ?Electronic Signature(s) ?Signed: 04/24/2021 4:16:55 PM By: Donnamarie Poag ?Entered ByDonnamarie Poag on 04/24/2021 11:54:57 ?ZIDAN, HELGET ([id number]) ?-------------------------------------------------------------------------------- ?Lower Extremity Assessment Details ?Patient Name: [name] ?Date of Service: 04/24/2021 11:30 AM ?Medical Record Number: [id number] ?Patient Account Number: [id number] ?Date of Birth/Sex: 10/17/1940 (81 y.o. M) ?Treating RN: Donnamarie Poag ?Primary Care Vue Pavon: Elsie Stain Other Clinician: ?Referring Jayke Caul: Elsie Stain ?Treating Basim Bartnik/Extender: Jeri Cos ?Weeks in Treatment: 8 ?Edema Assessment ?Assessed: [Left: Yes] [Right: Yes] ?Edema: [Left: Yes] [Right: Yes] ?Calf ?Left: Right: ?Point of Measurement: 36 cm From Medial Instep 34.5 cm 35 cm ?Ankle ?Left: Right: ?Point of Measurement: 15 cm From Medial Instep 25.3 cm 27.5 cm ?Vascular Assessment ?Pulses: ?Dorsalis Pedis ?Palpable: [Left:No] [Right:No] ?Notes ?bilateral pedal pitting edema ?Electronic Signature(s) ?Signed: 04/24/2021 4:16:55 PM  By: Donnamarie Poag ?Entered ByDonnamarie Poag on 04/24/2021 11:47:30 ?CHARLIES, RAYBURN ([id number]) ?-------------------------------------------------------------------------------- ?Multi Wound Chart Details ?Patient Name: [name] ?Date of Service: 04/24/2021 11:30 AM ?Medical Record Number: [id number] ?Patient Account Number: [id number] ?Date of Birth/Sex: 10/01/1940 (81 y.o. 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 N/A ?Comorbid History: Cataracts, Neuropathy Cataracts, Neuropathy N/A ?Date Acquired: 02/07/2021 04/05/2021 N/A ?Weeks of Treatment: 8 2 N/A ?Wound Status: Open Open N/A ?Wound Recurrence: No No N/A ?Measurements L x W x D (cm) 0.8x0.8x0.5 0.6x0.8x0.1 N/A ?Area (cm?) : 0.503 0.377 N/A ?Volume (cm?) : 0.251 0.038 N/A ?% Reduction in Area: 70.30% -6.80% N/A ?% Reduction in Volume: 70.40% -8.60% N/A ?Classification: Full Thickness With Exposed Full Thickness Without Exposed N/A ?Support Structures Support Structures ?Exudate Amount: Large Medium N/A ?Exudate Type: Serous Serous N/A ?Exudate Color: amber amber N/A ?Wound Margin: Flat and Intact Distinct, outline attached N/A ?Granulation Amount: Large (67-100%) Large (67-100%) N/A ?Granulation Quality: Red, Hyper-granulation Pink, Pale N/A ?Necrotic Amount: Small (1-33%) Small (1-33%) N/A ?Exposed Structures: ?Fat Layer (Subcutaneous Tissue): ?Fat Layer (Subcutaneous Tissue): N/A ?Yes Yes ?Tendon: Yes ?Fascia: No ?Muscle: No ?Joint:  No ?Bone: No ?Epithelialization: Small (1-33%) None N/A ?Treatment Notes ?Electronic Signature(s) ?Signed: 04/24/2021 4:16:55 PM By: Donnamarie Poag ?Entered ByDonnamarie Poag on 04/24/2021 11:49:55 ?EISA, NECAISE ([id number]) ?-------------------------------------------------------------------------------- ?Multi-Disciplinary Care Plan Details ?Patient Name: SHAMARCUS, HOHEISEL ?Date of Service: 04/24/2021 11:30 AM ?Medical Record Number: [id number] ?Patient Account Number: [id number] ?Date of Birth/Sex: 31-Jul-1940 (81 y.o. M) ?Treating RN: Donnamarie Poag ?Primary Care Macaela Presas: Elsie Stain Other Clinician: ?Referring Halcyon Heck: Elsie Stain ?Treating Caila Cirelli/Extender: Jeri Cos ?Weeks in Treatment: 8 ?Active Inactive ?Wound/Skin Impairment ?Nursing Diagnoses: ?Impaired tissue integrity ?Knowledge deficit related to smoking impact on wound healing ?Knowledge deficit related to ulceration/compromised skin integrity ?Goals: ?Patient/caregiver will verbalize understanding of skin care regimen ?Date Initiated: 02/27/2021 ?Date Inactivated: 03/27/2021 ?Target Resolution Date: 02/27/2021 ?Goal Status: Met ?Ulcer/skin breakdown will have a volume reduction of 30% by week 4 ?Date Initiated: 02/27/2021 ?Target Resolution Date: 03/27/2021 ?Goal Status: Active ?Ulcer/skin breakdown will have a volume reduction of 50% by week 8 ?Date Initiated: 02/27/2021 ?Target Resolution Date: 04/24/2021 ?Goal Status: Active ?Interventions: ?Assess ulceration(s) every visit ?Treatment Activities: ?Skin care regimen initiated : 02/27/2021 ?Topical wound management initiated : 02/27/2021 ?Notes: ?Electronic Signature(s) ?Signed: 04/24/2021 4:16:55 PM By: Donnamarie Poag ?Entered ByDonnamarie Poag on 04/24/2021 11:47:42 ?SHELIA, MAGALLON ([id number]) ?-------------------------------------------------------------------------------- ?Pain Assessment Details ?Patient Name: KEELON, ZURN ?Date of Service: 04/24/2021 11:30 AM ?Medical Record Number:  [id number] ?Patient Account Number: [id number] ?Date of Birth/Sex: Apr 01, 1940 (81 y.o. M) ?Treating RN: Donnamarie Poag ?Primary Care Mirna Sutcliffe: Elsie Stain Other Clinician: ?Referring Sarika Baldini: Elsie Stain ?Treating Pola Furno/Extender: Ky Barban

## 2021-04-26 NOTE — Progress Notes (Signed)
WYNDHAM, SANTILLI ([id number]) ?Visit Report for 04/24/2021 ?Chief Complaint Document Details ?Patient Name: AULTON, ROUTT ?Date of Service: 04/24/2021 11:30 AM ?Medical Record Number: [id number] ?Patient Account Number: [id number] ?Date of Birth/Sex: [birth year] (81 y.o. M) ?Treating RN: Donnamarie Poag ?Primary Care Provider: Elsie Stain Other Clinician: ?Referring Provider: Elsie Stain ?Treating Provider/Extender: Jeri Cos ?Weeks in Treatment: 8 ?Information Obtained from: Patient ?Chief Complaint ?Left LE Ulcer ?Electronic Signature(s) ?Signed: 04/24/2021 11:57:20 AM By: Worthy Keeler PA-C ?Entered By: Worthy Keeler on 04/24/2021 11:57:20 ?BOLESLAUS, HOLLOWAY ([id number]) ?-------------------------------------------------------------------------------- ?HPI Details ?Patient Name: MAYFIELD, SCHOENE ?Date of Service: 04/24/2021 11:30 AM ?Medical Record Number: [id number] ?Patient Account Number: [id number] ?Date of Birth/Sex: [birth year] (81 y.o. M) ?Treating RN: Donnamarie Poag ?Primary Care Provider: Elsie Stain Other Clinician: ?Referring Provider: Elsie Stain ?Treating Provider/Extender: Jeri Cos ?Weeks in Treatment: 8 ?History of Present Illness ?HPI Description: 02/27/2021 upon evaluation today patient presents for initial evaluation here in the clinic sent concerning an issue on the left ?lateral heel which has been present currently for about 3 weeks that he notes. With that being said he did have an issue where he hit this area on ?a lawnmower over the summer 2022. Nonetheless the patient tells me that unfortunately though it did not seem to give him a lot of trouble ?immediately following this reopened more recently. He has not had any x-rays up to this time. 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 Subsequently he did have a fairly good ?arterial test today here in the clinic measured at 1.03 but I am somewhat cautious with that due to the fact that his extremity is very cold ?nonetheless he seems to have pretty good blood flow of the wound when I am cleaning this today. We will keep an eye on this and if I need to ?send him for formal arterial testing I will do so. ?03/13/2021 upon evaluation today patient appears to be doing well with regard to his wound. He has been tolerating the dressing changes without ?complication. Fortunately he does not show any signs of active infection at this time which is great news. No fevers, chills, nausea, vomiting, or ?diarrhea. With that being said his x-ray was negative for any signs of obvious osteomyelitis obviously an x-ray is not the best test for this but an ?MRI would be. Nonetheless I do not think that we need to jump straight to that at this point. ?03/27/2021 upon evaluation today patient actually appears to be doing extremely well in regard to his wounds. He has been tolerating the dressing ?changes on the left lateral ankle. I am actually extremely pleased with how this appears and I think that he is making excellent progress. ?Fortunately I do not see any evidence of active infection at this time which is great news. ?04/03/2021 upon evaluation today patient appears to be doing well with regard to his wound. Has been tolerating the dressing changes without ?complication. Fortunately there is no sign of active infection locally or systemically which is great news. No fevers, chills, nausea, vomiting, or ?diarrhea. ?04/08/2021 upon evaluation today patient appears to be doing poorly today in regard to his right heel which unfortunately has an open wound that ?was not present at the last evaluation. There  does not appear to be any signs of active infection locally or systemically at this time which is great ?news. No  fevers, chills, nausea, vomiting, or diarrhea. ?04/17/2021 upon evaluation patient appears to be doing well today in regard to his wound on both locations. I do feel like he is showing signs of ?improvement which is great news and overall I do feel like that were headed in the right direction reason silver collagen at both locations ?currently. ?04/24/2021 upon evaluation today patient appears to be doing well with regard to his wounds. We did get a denial for the skin itself unfortunately ?but the good news is he is actually showing some signs of improvement already so this is probably not even going to become necessary. ?Electronic Signature(s) ?Signed: 04/24/2021 12:50:15 PM By: Worthy Keeler PA-C ?Entered By: Worthy Keeler on 04/24/2021 12:50:15 ?DENZELL, COLASANTI ([id number]) ?-------------------------------------------------------------------------------- ?Physical Exam Details ?Patient Name: ALIN, CHAVIRA ?Date of Service: 04/24/2021 11:30 AM ?Medical Record Number: [id number] ?Patient Account Number: [id number] ?Date of Birth/Sex: February 07, 1940 (81 y.o. M) ?Treating RN: Donnamarie Poag ?Primary Care Provider: Elsie Stain Other Clinician: ?Referring Provider: Elsie Stain ?Treating Provider/Extender: Jeri Cos ?Weeks in Treatment: 8 ?Constitutional ?Well-nourished and well-hydrated in no acute distress. ?Respiratory ?normal breathing without difficulty. ?Psychiatric ?this patient is able to make decisions and demonstrates good insight into disease process. Alert and Oriented x 3. pleasant and cooperative. ?Notes ?Upon inspection patient's wound bed actually showed signs of good granulation epithelization at both locations and overall I am extremely pleased ?with where we stand today. I do not see any signs of active infection locally nor systemically at this time. ?Electronic Signature(s) ?Signed: 04/24/2021 12:50:28 PM By: Worthy Keeler PA-C ?Entered By: Worthy Keeler on 04/24/2021 12:50:28 ?KAWHI, DIEBOLD ([id number]) ?-------------------------------------------------------------------------------- ?Physician Orders Details ?Patient Name: KAIDYN, JAVID ?Date of Service: 04/24/2021 11:30 AM ?Medical Record Number: [id number] ?Patient Account Number: [id number] ?Date of Birth/Sex: 08-02-1940 (81 y.o. M) ?Treating RN: Donnamarie Poag ?Primary Care Provider: Elsie Stain Other Clinician: ?Referring Provider: Elsie Stain ?Treating Provider/Extender: Jeri Cos ?Weeks in Treatment: 8 ?Verbal / Phone Orders: No ?Diagnosis Coding ?Follow-up Appointments ?Wound #1 Left,Lateral Ankle ?o Return Appointment in 1 week. ?o Nurse Visit as needed ?Bathing/ Shower/ Hygiene ?Wound #1 Left,Lateral Ankle ?o May shower with wound dressing protected with water repellent cover or cast protector. ?o No tub bath. ?Anesthetic (Use 'Patient Medications' Section for Anesthetic Order Entry) ?o Lidocaine applied to wound bed ?Edema Control - Lymphedema / Segmental Compressive Device / Other ?o Elevate, Exercise Daily and Avoid Standing for Long Periods of Time. ?o Elevate legs to the level of the heart and pump ankles as often as possible ?o Elevate leg(s) parallel to the floor when sitting. ?Additional Orders / Instructions ?o Follow Nutritious Diet and Increase Protein Intake ?Wound Treatment ?Wound #1 - Ankle Wound Laterality: Left, Lateral ?Cleanser: Soap and Water 1 x Per Week/15 Days ?Discharge Instructions: Gently cleanse wound with antibacterial soap, rinse and pat dry prior to dressing wounds ?Cleanser: Wound Cleanser 1 x Per Week/15 Days ?Discharge Instructions: Wash your hands with soap and water. Remove old dressing, discard into plastic bag and place into trash. ?Cleanse the wound with Wound Cleanser prior to applying a clean dressing using gauze sponges, not tissues or cotton balls. Do not ?scrub or use excessive force. Pat dry using gauze sponges, not tissue or cotton balls. ?Primary Dressing: Prisma 4.34  (in) 1 x Per Week/15 Days ?Discharge Instructions: Moisten w/normal saline or  sterile water; Cover wound as directed. Do not remove from wound bed. ?Secondary Dressing: (NON-BORDER) Zetuvit Plus Silicone NON-

## 2021-05-01 ENCOUNTER — Encounter: Payer: Medicare PPO | Admitting: Physician Assistant

## 2021-05-01 DIAGNOSIS — G9009 Other idiopathic peripheral autonomic neuropathy: Secondary | ICD-10-CM | POA: Diagnosis not present

## 2021-05-01 DIAGNOSIS — L97412 Non-pressure chronic ulcer of right heel and midfoot with fat layer exposed: Secondary | ICD-10-CM | POA: Diagnosis not present

## 2021-05-01 DIAGNOSIS — L97325 Non-pressure chronic ulcer of left ankle with muscle involvement without evidence of necrosis: Secondary | ICD-10-CM | POA: Diagnosis not present

## 2021-05-01 DIAGNOSIS — L97422 Non-pressure chronic ulcer of left heel and midfoot with fat layer exposed: Secondary | ICD-10-CM | POA: Diagnosis not present

## 2021-05-01 DIAGNOSIS — Z856 Personal history of leukemia: Secondary | ICD-10-CM | POA: Diagnosis not present

## 2021-05-01 NOTE — Progress Notes (Addendum)
KEADEN, GUNNOE ([id number]) ?Visit Report for 05/01/2021 ?Chief Complaint Document Details ?Patient Name: [name], [name] ?Date of Service: 05/01/2021 11:30 AM ?Medical Record Number: [id number] ?Patient Account Number: [id number] ?Date of Birth/Sex: 1940/11/27 (81 y.o. M) ?Treating RN: Donnamarie Poag ?Primary Care Provider: Elsie Stain Other Clinician: ?Referring Provider: Elsie Stain ?Treating Provider/Extender: Jeri Cos ?Weeks in Treatment: 9 ?Information Obtained from: Patient ?Chief Complaint ?Left LE Ulcer ?Electronic Signature(s) ?Signed: 05/01/2021 11:54:21 AM By: Worthy Keeler PA-C ?Entered By: Worthy Keeler on 05/01/2021 11:54:20 ?BRANCH, PACITTI ([id number]) ?-------------------------------------------------------------------------------- ?HPI Details ?Patient Name: [name] ?Date of Service: 05/01/2021 11:30 AM ?Medical Record Number: [id number] ?Patient Account Number: [id number] ?Date of Birth/Sex: [birth year] (81 y.o. M) ?Treating RN: Donnamarie Poag ?Primary Care Provider: Elsie Stain Other Clinician: ?Referring Provider: Elsie Stain ?Treating Provider/Extender: Jeri Cos ?Weeks in Treatment: 9 ?History of Present Illness ?HPI Description: 02/27/2021 upon evaluation today patient presents for initial evaluation here in the clinic sent concerning an issue on the left ?lateral heel which has been present currently for about 3 weeks that he notes. With that being said he did have an issue where he hit this area on ?a lawnmower over the summer 2022. Nonetheless the patient tells me that unfortunately though it did not seem to give him a lot of trouble ?immediately following this reopened more recently. He has not had any x-rays up to this time. 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 Subsequently he did have a fairly good ?arterial test today here in the clinic measured at 1.03 but I am somewhat cautious with that due to the fact that his extremity is very cold ?nonetheless he seems to have pretty good blood flow of the wound when I am cleaning this today. We will keep an eye on this and if I need to ?send him for formal arterial testing I will do so. ?03/13/2021 upon evaluation today patient appears to be doing well with regard to his wound. He has been tolerating the dressing changes without ?complication. Fortunately he does not show any signs of active infection at this time which is great news. No fevers, chills, nausea, vomiting, or ?diarrhea. With that being said his x-ray was negative for any signs of obvious osteomyelitis obviously an x-ray is not the best test for this but an ?MRI would be. Nonetheless I do not think that we need to jump straight to that at this point. ?03/27/2021 upon evaluation today patient actually appears to be doing extremely well in regard to his wounds. He has been tolerating the dressing ?changes on the left lateral ankle. I am actually extremely pleased with how this appears and I think that he is making excellent progress. ?Fortunately I do not see any evidence of active infection at this time which is great news. ?04/03/2021 upon evaluation today patient appears to be doing well with regard to his wound. Has been tolerating the dressing changes without ?complication. Fortunately there is no sign of active infection locally or systemically which is great news. No fevers, chills, nausea, vomiting, or ?diarrhea. ?04/08/2021 upon evaluation today patient appears to be doing poorly today in regard to his right heel which unfortunately has an open wound that ?was not present at the last evaluation. There  does not appear to be any signs of active infection locally or systemically at this time which is great ?news. No  fevers, chills, nausea, vomiting, or diarrhea. ?04/17/2021 upon evaluation patient appears to be doing well today in regard to his wound on both locations. I do feel like he is showing signs of ?improvement which is great news and overall I do feel like that were headed in the right direction reason silver collagen at both locations ?currently. ?04/24/2021 upon evaluation today patient appears to be doing well with regard to his wounds. We did get a denial for the skin itself unfortunately ?but the good news is he is actually showing some signs of improvement already so this is probably not even going to become necessary. ?05/01/2021 upon evaluation today patient's wounds are actually showing signs of excellent improvement. I am actually extremely pleased with ?where we stand today. I do not see any evidence of active infection locally nor systemically which is great news. No fevers, chills, nausea, ?vomiting, or diarrhea. ?Electronic Signature(s) ?Signed: 05/01/2021 1:22:38 PM By: Worthy Keeler PA-C ?Entered By: Worthy Keeler on 05/01/2021 13:22:37 ?GARL, SPEIGNER ([id number]) ?-------------------------------------------------------------------------------- ?Physical Exam Details ?Patient Name: [name] ?Date of Service: 05/01/2021 11:30 AM ?Medical Record Number: [id number] ?Patient Account Number: [id number] ?Date of Birth/Sex: 09/15/1940 (81 y.o. M) ?Treating RN: Donnamarie Poag ?Primary Care Provider: Elsie Stain Other Clinician: ?Referring Provider: Elsie Stain ?Treating Provider/Extender: Jeri Cos ?Weeks in Treatment: 9 ?Constitutional ?Well-nourished and well-hydrated in no acute distress. ?Respiratory ?normal breathing without difficulty. ?Psychiatric ?this patient is able to make decisions and demonstrates good insight into disease process. Alert and Oriented x 3. pleasant and cooperative. ?Notes ?Upon inspection patient's wound bed showed evidence of good granulation and epithelization at this  point. Overall I feel like the patient is making ?excellent progress and this is great news. I think that he is on the right track. I am hopeful we will get a be able to get this closed as quickly as ?possible obviously we were denied for the skin substitute which I think could have been of benefit for him to get this closed faster especially over ?top of the exposed structure but nonetheless we are making progress without that even. ?Electronic Signature(s) ?Signed: 05/01/2021 1:23:08 PM By: Worthy Keeler PA-C ?Entered By: Worthy Keeler on 05/01/2021 13:23:08 ?BENJAMIN, CASANAS ([id number]) ?-------------------------------------------------------------------------------- ?Physician Orders Details ?Patient Name: JAQUA, CHING ?Date of Service: 05/01/2021 11:30 AM ?Medical Record Number: [id number] ?Patient Account Number: [id number] ?Date of Birth/Sex: [birth year] (81 y.o. 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 ?o Nurse Visit as needed ?Bathing/ Shower/ Hygiene ?Wound #1 Left,Lateral Ankle ?o May shower with wound dressing protected with water repellent cover or cast protector. ?o No tub bath. ?Anesthetic (Use 'Patient Medications' Section for Anesthetic Order Entry) ?o Lidocaine applied to wound bed ?Edema Control - Lymphedema / Segmental Compressive Device / Other ?o Optional: One layer of unna paste to top of compression wrap (to act as an  anchor). - use ABD pad under unna paste ?  o Elevate, Exercise Daily and Avoid Standing for Long Periods of Time. ?o Elevate legs to the level of the heart and pump ankles as often as possible ?o Elev

## 2021-05-01 NOTE — Progress Notes (Signed)
[name] ([id number]) ?Visit Report for 05/01/2021 ?Arrival Information Details ?Patient Name: [name], [name] ?Date of Service: 05/01/2021 11:30 AM ?Medical Record Number: [id number] ?Patient Account Number: [id number] ?Date of Birth/Sex: Jun 15, 1940 (81 y.o. M) ?Treating RN: [name] ?Primary Care [name]: [name] Other Clinician: ?Referring [name]: [name] ?Treating [name]/Extender: [name] ?Weeks in Treatment: 9 ?Visit Information History Since Last Visit ?Added or deleted any medications: No ?Patient Arrived: Wheel Chair ?Had a fall or experienced change in No ?Arrival Time: 11:42 ?activities of daily living that may affect ?Accompanied By: daughter ?risk of falls: ?Transfer Assistance: [employer] Patient Lift ?Hospitalized since last visit: No ?Patient Identification Verified: Yes ?Has Dressing in Place as Prescribed: Yes ?Secondary Verification Process Completed: Yes ?Has Compression in Place as Prescribed: Yes ?Patient Requires Transmission-Based No ?Pain Present Now: No ?Precautions: ?Patient Has Alerts: Yes ?Patient Alerts: Patient on Blood ?Thinner ?81MG x 2 ?NOT DIABETIC ?Electronic Signature(s) ?Signed: 05/01/2021 1:26:53 PM By: [name] ?Entered ByDonnamarie Morgan on 05/01/2021 11:42:52 ?[name] ([id number]) ?-------------------------------------------------------------------------------- ?Compression Therapy Details ?Patient Name: [name], [name] ?Date of Service: 05/01/2021 11:30 AM ?Medical Record Number: [id number] ?Patient Account Number: [id number] ?Date of Birth/Sex: [birth year] (82 y.o. M) ?Treating RN: [name] ?Primary Care [name]: [name] Other Clinician: ?Referring Veola Cafaro: [name] ?Treating Jaiven Graveline/Extender: [name] ?Weeks in Treatment: 9 ?Compression Therapy Performed for Wound Assessment: Wound #1 Left,Lateral Ankle ?Performed By: Clinician Michael Poag, RN ?Compression Type: Three Layer ?Post Procedure Diagnosis ?Same as  Pre-procedure ?Electronic Signature(s) ?Signed: 05/01/2021 1:26:53 PM By: [name] ?Entered ByDonnamarie Morgan on 05/01/2021 12:04:48 ?[name], [name] ([id number]) ?-------------------------------------------------------------------------------- ?Encounter Discharge Information Details ?Patient Name: [name], [name] ?Date of Service: 05/01/2021 11:30 AM ?Medical Record Number: [id number] ?Patient Account Number: [id number] ?Date of Birth/Sex: 12-02-1940 (81 y.o. M) ?Treating RN: [name] ?Primary Care [name]: [name] Other Clinician: ?Referring Loraina Stauffer: [name] ?Treating Brandyce Dimario/Extender: [name] ?Weeks in Treatment: 9 ?Encounter Discharge Information Items ?Discharge Condition: Stable ?Ambulatory Status: Wheelchair ?Discharge Destination: Home ?Transportation: Private Auto ?Accompanied By: daughter ?Schedule Follow-up Appointment: Yes ?Clinical Summary of Care: ?Electronic Signature(s) ?Signed: 05/01/2021 1:26:53 PM By: [name] ?Entered ByDonnamarie Morgan on 05/01/2021 12:08:59 ?[name], [name] ([id number]) ?-------------------------------------------------------------------------------- ?Lower Extremity Assessment Details ?Patient Name: [name], [name] ?Date of Service: 05/01/2021 11:30 AM ?Medical Record Number: [id number] ?Patient Account Number: [id number] ?Date of Birth/Sex: April 18, 1940 (81 y.o. M) ?Treating RN: [name] ?Primary Care [name]: [name] Other Clinician: ?Referring [name]: [name] ?Treating Selisa Tensley/Extender: [name] ?Weeks in Treatment: 9 ?Edema Assessment ?Assessed: [Left: Yes] [Right: Yes] ?Edema: [Left: Ye] [Right: s] ?Calf ?Left: Right: ?Point of Measurement: 36 cm From Medial Instep 34.5 cm 35 cm ?Ankle ?Left: Right: ?Point of Measurement: 15 cm From Medial Instep 25 cm 27.5 cm ?Electronic Signature(s) ?Signed: 05/01/2021 1:26:53 PM By: [name] ?Entered ByDonnamarie Morgan on 05/01/2021 12:03:57 ?[name], [name]  ([id number]) ?-------------------------------------------------------------------------------- ?Multi Wound Chart Details ?Patient Name: [name], [name] ?Date of Service: 05/01/2021 11:30 AM ?Medical Record Number: [id number] ?Patient Account Number: [id number] ?Date of Birth/Sex: [birth year] (81 y.o. M) ?Treating RN: [name] ?Primary Care [name]: [name] Other Clinician: ?Referring [name]: [name] ?Treating [name]/Extender: [name] ?Weeks in Treatment: 9 ?Vital Signs ?Height(in): 76 ?Pulse(bpm): 78 ?Weight(lbs): 200 ?Blood Pressure(mmHg): 125/79 ?Body Mass Index(BMI): 24.3 ?Temperature(??F): 97.9 ?Respiratory Rate(breaths/min): 16 ?Photos: [N/A:N/A] ?Wound Location: Left, Lateral Ankle Right Calcaneus N/A ?Wounding Event: Trauma Gradually Appeared N/A ?Primary Etiology: Neuropathic Ulcer-Non Diabetic Neuropathic Ulcer-Non Diabetic N/A ?Comorbid History: Cataracts, Neuropathy Cataracts, Neuropathy  N/A ?Date Acquired: 02/07/2021 04/05/2021 N/A ?Weeks of Treatment: 9 3 N/A ?Wound Status: Open Open N/A ?Wound Recurrence: No No N/A ?Measurements L x W x D (cm) 0.5x0.5x0.4 0.5x0.6x0.1 N/A ?Area (cm?) : 0.196 0.236 N/A ?Volume (cm?) : 0.079 0.024 N/A ?% Reduction in Area: 88.40% 33.10% N/A ?% Reduction in Volume: 90.70% 31.40% N/A ?Classification: Full Thickness With Exposed Full Thickness Without Exposed N/A ?Support Structures Support Structures ?Exudate Amount: Large Medium N/A ?Exudate Type: Serous Serous N/A ?Exudate Color: amber amber N/A ?Wound Margin: Flat and Intact Distinct, outline attached N/A ?Granulation Amount: Large (67-100%) Large (67-100%) N/A ?Granulation Quality: Red, Hyper-granulation Pink, Pale N/A ?Necrotic Amount: Small (1-33%) Small (1-33%) N/A ?Exposed Structures: ?Fat Layer (Subcutaneous Tissue): ?Fat Layer (Subcutaneous Tissue): N/A ?Yes Yes ?Tendon: Yes ?Fascia: No ?Muscle: No ?Joint: No ?Bone: No ?Epithelialization: Small (1-33%) None N/A ?Treatment Notes ?Electronic  Signature(s) ?Signed: 05/01/2021 1:26:53 PM By: [name] ?Entered ByDonnamarie Morgan on 05/01/2021 12:04:26 ?[name], [name] ([id number]) ?-------------------------------------------------------------------------------- ?Multi-Disciplinary Care Plan Details ?Patient Name: [name], [name] ?Date of Service: 05/01/2021 11:30 AM ?Medical Record Number: [id number] ?Patient Account Number: [id number] ?Date of Birth/Sex: 1940/02/24 (81 y.o. M) ?Treating RN: [name] ?Primary Care Aidan Caloca: [name] Other Clinician: ?Referring Rhodesia Stanger: [name] ?Treating Shakiyah Cirilo/Extender: [name] ?Weeks in Treatment: 9 ?Active Inactive ?Wound/Skin Impairment ?Nursing Diagnoses: ?Impaired tissue integrity ?Knowledge deficit related to smoking impact on wound healing ?Knowledge deficit related to ulceration/compromised skin integrity ?Goals: ?Patient/caregiver will verbalize understanding of skin care regimen ?Date Initiated: 02/27/2021 ?Date Inactivated: 03/27/2021 ?Target Resolution Date: 02/27/2021 ?Goal Status: Met ?Ulcer/skin breakdown will have a volume reduction of 30% by week 4 ?Date Initiated: 02/27/2021 ?Target Resolution Date: 03/27/2021 ?Goal Status: Active ?Ulcer/skin breakdown will have a volume reduction of 50% by week 8 ?Date Initiated: 02/27/2021 ?Target Resolution Date: 04/24/2021 ?Goal Status: Active ?Interventions: ?Assess ulceration(s) every visit ?Treatment Activities: ?Skin care regimen initiated : 02/27/2021 ?Topical wound management initiated : 02/27/2021 ?Notes: ?Electronic Signature(s) ?Signed: 05/01/2021 1:26:53 PM By: [name] ?Entered ByDonnamarie Morgan on 05/01/2021 12:04:08 ?[name], [name] ([id number]) ?-------------------------------------------------------------------------------- ?Pain Assessment Details ?Patient Name: [name], [name] ?Date of Service: 05/01/2021 11:30 AM ?Medical Record Number: [id number] ?Patient Account Number: [id number] ?Date of Birth/Sex: 12/09/1940 (81 y.o. M) ?Treating RN:  [name] ?Primary Care Haddy Mullinax: [name] Other Clinician: ?Referring Shamya Macfadden: [name] ?Treating Lucas Winograd/Extender: [name] ?Weeks in Treatment: 9 ?Active Problems ?Location of Pain Severity and Description of Pain ?Patient Lamonte Sakai

## 2021-05-08 ENCOUNTER — Encounter: Payer: Medicare PPO | Attending: Physician Assistant | Admitting: Physician Assistant

## 2021-05-08 ENCOUNTER — Encounter: Payer: Medicare PPO | Admitting: Physician Assistant

## 2021-05-08 DIAGNOSIS — Z856 Personal history of leukemia: Secondary | ICD-10-CM | POA: Insufficient documentation

## 2021-05-08 DIAGNOSIS — G9009 Other idiopathic peripheral autonomic neuropathy: Secondary | ICD-10-CM | POA: Insufficient documentation

## 2021-05-08 DIAGNOSIS — L97322 Non-pressure chronic ulcer of left ankle with fat layer exposed: Secondary | ICD-10-CM | POA: Diagnosis not present

## 2021-05-08 DIAGNOSIS — L97422 Non-pressure chronic ulcer of left heel and midfoot with fat layer exposed: Secondary | ICD-10-CM | POA: Diagnosis not present

## 2021-05-08 DIAGNOSIS — L97412 Non-pressure chronic ulcer of right heel and midfoot with fat layer exposed: Secondary | ICD-10-CM | POA: Insufficient documentation

## 2021-05-08 NOTE — Progress Notes (Signed)
[name] ([id number]) ?Visit Report for 05/08/2021 ?Arrival Information Details ?Patient Name: [name], [name] ?Date of Service: 05/08/2021 11:30 AM ?Medical Record Number: [id number] ?Patient Account Number: [id number] ?Date of Birth/Sex: [birth year] (81 y.o. M) ?Treating RN: Donnamarie Poag ?Primary Care Ariyona Eid: Elsie Stain Other Clinician: ?Referring Khyri Hinzman: Elsie Stain ?Treating Dayna Alia/Extender: Jeri Cos ?Weeks in Treatment: 10 ?Visit Information History Since Last Visit ?Added or deleted any medications: No ?Patient Arrived: Wheel Chair ?Had a fall or experienced change in No ?Arrival Time: 11:44 ?activities of daily living that may affect ?Accompanied By: daughter ?risk of falls: ?Transfer Assistance: Manual ?Hospitalized since last visit: No ?Patient Identification Verified: Yes ?Has Dressing in Place as Prescribed: Yes ?Secondary Verification Process Completed: Yes ?Has Compression in Place as Prescribed: Yes ?Patient Requires Transmission-Based No ?Pain Present Now: No ?Precautions: ?Patient Has Alerts: Yes ?Patient Alerts: Patient on Blood ?Thinner ?81MG x 2 ?NOT DIABETIC ?Electronic Signature(s) ?Signed: 05/08/2021 4:38:12 PM By: Donnamarie Poag ?Entered ByDonnamarie Poag on 05/08/2021 11:46:37 ?[name], [name] ([id number]) ?-------------------------------------------------------------------------------- ?Compression Therapy Details ?Patient Name: [name] ?Date of Service: 05/08/2021 11:30 AM ?Medical Record Number: [id number] ?Patient Account Number: [id number] ?Date of Birth/Sex: April 25, 1940 (81 y.o. M) ?Treating RN: Donnamarie Poag ?Primary Care Dianna Ewald: Elsie Stain Other Clinician: ?Referring Mi Balla: Elsie Stain ?Treating Vercie Pokorny/Extender: Jeri Cos ?Weeks in Treatment: 10 ?Compression Therapy Performed for Wound Assessment: Wound #1 Left,Lateral Ankle ?Performed By: Clinician Donnamarie Poag, RN ?Compression Type: Three Layer ?Post Procedure Diagnosis ?Same as Pre-procedure ?Electronic  Signature(s) ?Signed: 05/08/2021 4:38:12 PM By: Donnamarie Poag ?Entered ByDonnamarie Poag on 05/08/2021 12:01:47 ?[name], [name] ([id number]) ?-------------------------------------------------------------------------------- ?Encounter Discharge Information Details ?Patient Name: [name], [name] ?Date of Service: 05/08/2021 11:30 AM ?Medical Record Number: [id number] ?Patient Account Number: [id number] ?Date of Birth/Sex: 1940-11-11 (81 y.o. M) ?Treating RN: Donnamarie Poag ?Primary Care Jaxon Flatt: Elsie Stain Other Clinician: ?Referring Orian Amberg: Elsie Stain ?Treating Pheonix Clinkscale/Extender: Jeri Cos ?Weeks in Treatment: 10 ?Encounter Discharge Information Items Post Procedure Vitals ?Discharge Condition: Stable ?Temperature (?F): 98.2 ?Ambulatory Status: Wheelchair ?Pulse (bpm): 72 ?Discharge Destination: Home ?Respiratory Rate (breaths/min): 16 ?Transportation: Private Auto ?Blood Pressure (mmHg): 121/69 ?Accompanied By: daughter ?Schedule Follow-up Appointment: Yes ?Clinical Summary of Care: ?Electronic Signature(s) ?Signed: 05/08/2021 4:38:12 PM By: Donnamarie Poag ?Entered ByDonnamarie Poag on 05/08/2021 12:15:34 ?[name], [name] ([id number]) ?-------------------------------------------------------------------------------- ?Lower Extremity Assessment Details ?Patient Name: [name] ?Date of Service: 05/08/2021 11:30 AM ?Medical Record Number: [id number] ?Patient Account Number: [id number] ?Date of Birth/Sex: 1940/12/20 (81 y.o. M) ?Treating RN: Donnamarie Poag ?Primary Care Chung Chagoya: Elsie Stain Other Clinician: ?Referring Saket Hellstrom: Elsie Stain ?Treating [place] Pettengill/Extender: Jeri Cos ?Weeks in Treatment: 10 ?Edema Assessment ?Assessed: [Left: Yes] [Right: Yes] ?Edema: [Left: Yes] [Right: Yes] ?Calf ?Left: Right: ?Point of Measurement: 36 cm From Medial Instep ?Ankle ?Left: Right: ?Point of Measurement: 15 cm From Medial Instep 24.5 cm 28 cm ?Electronic Signature(s) ?Signed: 05/08/2021 4:38:12 PM By: Donnamarie Poag ?Entered  ByDonnamarie Poag on 05/08/2021 11:57:19 ?[name], [name] ([id number]) ?-------------------------------------------------------------------------------- ?Multi Wound Chart Details ?Patient Name: [name], [name] ?Date of Service: 05/08/2021 11:30 AM ?Medical Record Number: [id number] ?Patient Account Number: [id number] ?Date of Birth/Sex: 08-24-1940 (81 y.o. 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Exudate Color: red, brown amber N/A ?Wound Margin: Flat and Intact Distinct, outline attached N/A ?Granulation Amount: Large (67-100%) Large (67-100%) N/A ?Granulation Quality: Red, Hyper-granulation Pink, Pale N/A ?Necrotic Amount: Small (1-33%) Small (1-33%) N/A ?Exposed Structures: ?Fat Layer (Subcutaneous Tissue): ?Fat Layer (Subcutaneous Tissue): N/A ?Yes Yes ?Fascia: No ?Tendon: No ?Muscle: No ?Joint: No ?Bone:  No ?Epithelialization: Small (1-33%) None N/A ?Treatment Notes ?Electronic Signature(s) ?Signed: 05/08/2021 4:38:12 PM By: Donnamarie Poag ?Entered ByDonnamarie Poag on 05/08/2021 11:57:51 ?[name], [name] ([id number]) ?-------------------------------------------------------------------------------- ?Multi-Disciplinary Care Plan Details ?Patient Name: [name], [name] ?Date of Service: 05/08/2021 11:30 AM ?Medical Record Number: [id number] ?Patient Account Number: [id number] ?Date of Birth/Sex: 1941-01-08 (81 y.o. M) ?Treating RN: Donnamarie Poag ?Primary Care Telicia Hodgkiss: Elsie Stain Other Clinician: ?Referring Mario Coronado: Elsie Stain ?Treating Herman Fiero/Extender: Jeri Cos ?Weeks in Treatment: 10 ?Active Inactive ?Wound/Skin Impairment ?Nursing Diagnoses: ?Impaired tissue integrity ?Knowledge deficit related to smoking impact on wound healing ?Knowledge deficit related to ulceration/compromised skin integrity ?Goals: ?Patient/caregiver will verbalize understanding of skin care regimen ?Date Initiated: 02/27/2021 ?Date Inactivated: 03/27/2021 ?Target Resolution Date: 02/27/2021 ?Goal Status: Met ?Ulcer/skin breakdown will have a volume reduction of 30% by week 4 ?Date Initiated: 02/27/2021 ?Date Inactivated: 05/08/2021 ?Target Resolution Date: 03/27/2021 ?Goal Status: Met ?Ulcer/skin breakdown will have a volume reduction of 50% by week 8 ?Date Initiated: 02/27/2021 ?Target Resolution Date: 04/24/2021 ?Goal Status: Active ?Interventions: ?Assess ulceration(s) every visit ?Treatment Activities: ?Skin care regimen initiated : 02/27/2021 ?Topical wound management initiated : 02/27/2021 ?Notes: ?Electronic Signature(s) ?Signed: 05/08/2021 4:38:12 PM By: Donnamarie Poag ?Entered ByDonnamarie Poag on 05/08/2021 11:57:36 ?[name], [name] ([id number]) ?-------------------------------------------------------------------------------- ?Pain Assessment Details ?Patient Name: [name], [name] ?Date of Service: 05/08/2021 11:30 AM ?Medical Record Number:  [id number] ?Patient Account Number: [id number] ?Date of Birth/Sex: Sep 16, 1940 (81 y.o. M) ?Treating RN: Donnamarie Poag ?Primary Care Zollie Clemence: Elsie Stain Other Clinician: ?Referring Saramarie Stinger: Elsie Stain ?Treatin

## 2021-05-08 NOTE — Progress Notes (Addendum)
ZAYAAN, KOZAK ([id number]) ?Visit Report for 05/08/2021 ?Chief Complaint Document Details ?Patient Name: [name], [name] ?Date of Service: 05/08/2021 11:30 AM ?Medical Record Number: [id number] ?Patient Account Number: [id number] ?Date of Birth/Sex: [birth year] (81 y.o. M) ?Treating RN: Donnamarie Poag ?Primary Care Provider: Elsie Stain Other Clinician: ?Referring Provider: Elsie Stain ?Treating Provider/Extender: Jeri Cos ?Weeks in Treatment: 10 ?Information Obtained from: Patient ?Chief Complaint ?Left LE Ulcer ?Electronic Signature(s) ?Signed: 05/08/2021 11:54:39 AM By: Worthy Keeler PA-C ?Entered By: Worthy Keeler on 05/08/2021 11:54:38 ?WILBER, FINI ([id number]) ?-------------------------------------------------------------------------------- ?Debridement Details ?Patient Name: [name] ?Date of Service: 05/08/2021 11:30 AM ?Medical Record Number: [id number] ?Patient Account Number: [id number] ?Date of Birth/Sex: 05-13-1940 (81 y.o. M) ?Treating RN: Donnamarie Poag ?Primary Care Provider: Elsie Stain Other Clinician: ?Referring Provider: Elsie Stain ?Treating Provider/Extender: Jeri Cos ?Weeks in Treatment: 10 ?Debridement Performed for ?Wound #1 Left,Lateral Ankle ?Assessment: ?Performed By: Physician Tommie Sams., PA-C ?Debridement Type: Debridement ?Level of Consciousness (Pre- ?Awake and Alert ?procedure): ?Pre-procedure Verification/Time Out ?Yes - 12:01 ?Taken: ?Start Time: 12:02 ?Pain Control: Lidocaine ?Total Area Debrided (L x W): 0.4 (cm) x 0.4 (cm) = 0.16 (cm?) ?Tissue and other material ?Subcutaneous, Skin: Dermis ?debrided: ?Level: Skin/Subcutaneous Tissue ?Debridement Description: Excisional ?Instrument: Curette ?Bleeding: None ?Response to Treatment: Procedure was tolerated well ?Level of Consciousness (Post- ?Awake and Alert ?procedure): ?Post Debridement Measurements of Total Wound ?Length: (cm) 0.3 ?Width: (cm) 0.3 ?Depth: (cm) 0.2 ?Volume: (cm?) 0.014 ?Character of  Wound/Ulcer Post Debridement: Improved ?Post Procedure Diagnosis ?Same as Pre-procedure ?Electronic Signature(s) ?Signed: 05/08/2021 4:38:12 PM By: Donnamarie Poag ?Signed: 05/09/2021 5:15:24 PM By: Worthy Keeler PA-C ?Entered ByDonnamarie Poag on 05/08/2021 12:04:17 ?[name] ([id number]) ?-------------------------------------------------------------------------------- ?HPI Details ?Patient Name: [name] ?Date of Service: 05/08/2021 11:30 AM ?Medical Record Number: [id number] ?Patient Account Number: [id number] ?Date of Birth/Sex: Mar 07, 1940 (81 y.o. M) ?Treating RN: Donnamarie Poag ?Primary Care Provider: Elsie Stain Other Clinician: ?Referring Provider: Elsie Stain ?Treating Provider/Extender: Jeri Cos ?Weeks in Treatment: 10 ?History of Present Illness ?HPI Description: 02/27/2021 upon evaluation today patient presents for initial evaluation here in the clinic sent concerning an issue on the left ?lateral heel which has been present currently for about 3 weeks that he notes. With that being said he did have an issue where he hit this area on ?a lawnmower over the summer 2022. Nonetheless the patient tells me that unfortunately though it did not seem to give him a lot of trouble ?immediately following this reopened more recently. He has not had any x-rays up to this time. 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 Subsequently he did have a fairly good ?arterial test today here in the clinic measured at 1.03 but I am somewhat cautious with that due to the fact that his extremity is very cold ?nonetheless he seems to have pretty good blood flow of the wound when I am cleaning this today. We will keep an eye on this and if I need to ?send him for formal  arterial testing I will do so. ?03/13/2021 upon evaluation today patient appears to be doing well with regard to his wound. He has been tolerating the dressing changes without ?complication. Fortunately he does not show any signs of active infection at this  time which is great news. No fevers, chills, nausea, vomiting, or ?diarrhea. With that being said his x-ray was negative for any signs of obvious osteomyelitis obviously an x-ray is not the best test for this but an ?MRI would be. Nonetheless I do not think that we need to jump straight to that at this point. ?03/27/2021 upon evaluation today patient actually appears to be doing extremely well in regard to his wounds. He has been tolerating the dressing ?changes on the left lateral ankle. I am actually extremely pleased with how this appears and I think that he is making excellent progress. ?Fortunately I do not see any evidence of active infection at this time which is great news. ?04/03/2021 upon evaluation today patient appears to be doing well with regard to his wound. Has been tolerating the dressing changes without ?complication. Fortunately there is no sign of active infection locally or systemically which is great news. No fevers, chills, nausea, vomiting, or ?diarrhea. ?04/08/2021 upon evaluation today patient appears to be doing poorly today in regard to his right heel which unfortunately has an open wound that ?was not present at the last evaluation. There does not appear to be any signs of active infection locally or systemically at this time which is great ?news. No fevers, chills, nausea, vomiting, or diarrhea. ?04/17/2021 upon evaluation patient appears to be doing well today in regard to his wound on both locations. I do feel like he is showing signs of ?improvement which is great news and overall I do feel like that were headed in the right direction reason silver collagen at both locations ?currently. ?04/24/2021 upon evaluation today patient appears to  be doing well with regard to his wounds. We did get a denial for the skin itself unfortunately ?but the good news is he is actually showing some signs of improvement already so this is probably not even going to become necessary. ?05/01/2021 upon evaluation today patient's wounds are actually showing signs of excellent improvement. I am actually extremely pleased with ?where we stand today. I do not see any evidence of active infection locally nor systemically which is great news. No fevers, chills, nausea, ?vomiting, or diarrhea. ?05-08-2021 upon evaluation today patient appears to be doing well with regard to his wounds. Both are showing signs of improvement which is ?great news. Overall I am extremely pleased with where we stand today. There does not appear to be any signs of active infection locally nor ?systemically at this time ?Electronic Signature(s) ?Signed: 05/08/2021 1:12:26 PM By: Worthy Keeler PA-C ?Entered By: Worthy Keeler on 05/08/2021 13:12:26 ?TERY, HOEGER ([id number]) ?-------------------------------------------------------------------------------- ?Physical Exam Details ?Patient Name: ALTUS, ZAINO ?Date of Service: 05/08/2021 11:30 AM ?Medical Record Number: [id number] ?Patient Account Number: [id number] ?Date of Birth/Sex: 1940/08/18 (81 y.o. M) ?Treating RN: Donnamarie Poag ?Primary Care Provider: Elsie Stain Other Clinician: ?Referring Provider: Elsie Stain ?Treating Provider/Extender: Jeri Cos ?Weeks in Treatment: 10 ?Constitutional ?Well-nourished and well-hydrated in no acute distress. ?Respiratory ?normal breathing without difficulty. ?Psychiatric ?this patient is able to make decisions and demonstrates good insight into disease process. Alert and Oriented x 3. pleasant and cooperative. ?Notes ?Upon inspection patient's wounds are showing signs of excellent improvement. I am actually extremely pleased with where we stand and I think ?the patient is making excellent progress here.  In fact I do not think there is any detriment to him having gotten on his mower and done the lawn ?over this past week. It seems like he had no issues  in that regard. ?Electronic Signature(s) ?Signed: 05/08/2021 1:

## 2021-05-09 ENCOUNTER — Encounter: Payer: Medicare PPO | Admitting: Physician Assistant

## 2021-05-15 ENCOUNTER — Encounter: Payer: Medicare PPO | Admitting: Physician Assistant

## 2021-05-15 DIAGNOSIS — G9009 Other idiopathic peripheral autonomic neuropathy: Secondary | ICD-10-CM | POA: Diagnosis not present

## 2021-05-15 DIAGNOSIS — L97322 Non-pressure chronic ulcer of left ankle with fat layer exposed: Secondary | ICD-10-CM | POA: Diagnosis not present

## 2021-05-15 DIAGNOSIS — Z856 Personal history of leukemia: Secondary | ICD-10-CM | POA: Diagnosis not present

## 2021-05-15 DIAGNOSIS — L97412 Non-pressure chronic ulcer of right heel and midfoot with fat layer exposed: Secondary | ICD-10-CM | POA: Diagnosis not present

## 2021-05-15 DIAGNOSIS — L97422 Non-pressure chronic ulcer of left heel and midfoot with fat layer exposed: Secondary | ICD-10-CM | POA: Diagnosis not present

## 2021-05-15 NOTE — Progress Notes (Addendum)
[name], [name] ([id number]) ?Visit Report for 05/15/2021 ?Arrival Information Details ?Patient Name: [name], [name] ?Date of Service: 05/15/2021 11:30 AM ?Medical Record Number: [id number] ?Patient Account Number: [id number] ?Date of Birth/Sex: January 21, 1941 (81 y.o. M) ?Treating RN: Donnamarie Poag ?Primary Care Eldridge Marcott: Elsie Stain Other Clinician: ?Referring Marven Veley: Elsie Stain ?Treating Ianna Salmela/Extender: Jeri Cos ?Weeks in Treatment: 11 ?Visit Information History Since Last Visit ?Added or deleted any medications: No ?Patient Arrived: Wheel Chair ?Had a fall or experienced change in No ?Arrival Time: 11:30 ?activities of daily living that may affect ?Accompanied By: son ?risk of falls: ?Transfer Assistance: [employer] Patient Lift ?Hospitalized since last visit: No ?Patient Identification Verified: Yes ?Has Dressing in Place as Prescribed: Yes ?Secondary Verification Process Completed: Yes ?Has Compression in Place as Prescribed: Yes ?Patient Requires Transmission-Based No ?Pain Present Now: No ?Precautions: ?Patient Has Alerts: Yes ?Patient Alerts: Patient on Blood ?Thinner ?81MG x 2 ?NOT DIABETIC ?Electronic Signature(s) ?Signed: 05/15/2021 4:01:40 PM By: Donnamarie Poag ?Entered ByDonnamarie Poag on 05/15/2021 11:31:10 ?BARTLEY, VUOLO ([id number]) ?-------------------------------------------------------------------------------- ?Compression Therapy Details ?Patient Name: [name], [name] ?Date of Service: 05/15/2021 11:30 AM ?Medical Record Number: [id number] ?Patient Account Number: [id number] ?Date of Birth/Sex: 10-10-1940 (81 y.o. M) ?Treating RN: Donnamarie Poag ?Primary Care Reeda Soohoo: Elsie Stain Other Clinician: ?Referring Carthel Castille: Elsie Stain ?Treating Tobie Perdue/Extender: Jeri Cos ?Weeks in Treatment: 11 ?Compression Therapy Performed for Wound Assessment: Wound #1 Left,Lateral Ankle ?Performed By: Clinician Donnamarie Poag, RN ?Compression Type: Three Layer ?Post Procedure Diagnosis ?Same as  Pre-procedure ?Electronic Signature(s) ?Signed: 05/15/2021 4:01:40 PM By: Donnamarie Poag ?Entered ByDonnamarie Poag on 05/15/2021 11:44:20 ?[name], [name] ([id number]) ?-------------------------------------------------------------------------------- ?Encounter Discharge Information Details ?Patient Name: [name], [name] ?Date of Service: 05/15/2021 11:30 AM ?Medical Record Number: [id number] ?Patient Account Number: [id number] ?Date of Birth/Sex: [birth year] (81 y.o. M) ?Treating RN: Donnamarie Poag ?Primary Care Devetta Hagenow: Elsie Stain Other Clinician: ?Referring Berk Pilot: Elsie Stain ?Treating Malin Cervini/Extender: Jeri Cos ?Weeks in Treatment: 11 ?Encounter Discharge Information Items ?Discharge Condition: Stable ?Ambulatory Status: Wheelchair ?Discharge Destination: Home ?Transportation: Private Auto ?Accompanied By: son ?Schedule Follow-up Appointment: Yes ?Clinical Summary of Care: ?Electronic Signature(s) ?Signed: 05/15/2021 4:01:40 PM By: Donnamarie Poag ?Entered ByDonnamarie Poag on 05/15/2021 12:00:58 ?[name], [name] ([id number]) ?-------------------------------------------------------------------------------- ?Lower Extremity Assessment Details ?Patient Name: [name], [name] ?Date of Service: 05/15/2021 11:30 AM ?Medical Record Number: [id number] ?Patient Account Number: [id number] ?Date of Birth/Sex: 01-29-1941 (81 y.o. M) ?Treating RN: Donnamarie Poag ?Primary Care Shell Yandow: Elsie Stain Other Clinician: ?Referring Anyely Cunning: Elsie Stain ?Treating Deanza Upperman/Extender: Jeri Cos ?Weeks in Treatment: 11 ?Edema Assessment ?Assessed: [Left: Yes] [Right: Yes] ?[Left: Edema] [Right: :] ?Ankle ?Left: Right: ?Point of Measurement: 15 cm From Medial Instep 24.6 cm 26 cm ?Electronic Signature(s) ?Signed: 05/15/2021 4:01:40 PM By: Donnamarie Poag ?Entered ByDonnamarie Poag on 05/15/2021 11:43:06 ?[name], [name] ([id number]) ?-------------------------------------------------------------------------------- ?Multi Wound Chart  Details ?Patient Name: [name], [name] ?Date of Service: 05/15/2021 11:30 AM ?Medical Record Number: [id number] ?Patient Account Number: [id number] ?Date of Birth/Sex: 01-May-1940 (81 y.o. 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 N/A ?Comorbid History: Cataracts, Neuropathy Cataracts, Neuropathy N/A ?Date Acquired: 02/07/2021 04/05/2021 N/A ?Weeks of Treatment: 11 5 N/A ?Wound Status: Open Open  N/A ?Wound Recurrence: No No N/A ?Measurements L x W x D (cm) 0.3x0.3x0.2 0.4x0.6x0.1 N/A ?Area (cm?) : 0.071 0.188 N/A ?Volume (cm?) : 0.014 0.019 N/A ?% Reduction in Area: 95.80% 46.70% N/A ?% Reduction in Volume: 98.30% 45.70% N/A ?Classification: Full Thickness With Exposed Full Thickness Without Exposed N/A ?Support Structures Support Structures ?Exudate Amount: Medium Medium N/A ?Exudate Type: Serosanguineous Serous N/A ?Exudate Color: red, brown amber N/A ?Wound Margin: Flat and Intact Distinct, outline attached N/A ?Granulation Amount: Large (67-100%) Large (67-100%) N/A ?Granulation Quality: Red, Hyper-granulation Pink, Pale N/A ?Necrotic Amount: Small (1-33%) Small (1-33%) N/A ?Exposed Structures: ?Fat Layer (Subcutaneous Tissue): ?Fat Layer (Subcutaneous Tissue): N/A ?Yes Yes ?Fascia: No ?Tendon: No ?Muscle: No ?Joint: No ?Bone: No ?Epithelialization: Small (1-33%) None N/A ?Treatment Notes ?Electronic Signature(s) ?Signed: 05/15/2021 4:01:40 PM By: Donnamarie Poag ?Entered ByDonnamarie Poag on 05/15/2021  11:43:53 ?[name], [name] ([id number]) ?-------------------------------------------------------------------------------- ?Multi-Disciplinary Care Plan Details ?Patient Name: [name], HAVEY ?Date of Service: 05/15/2021 11:30 AM ?Medical Record Number: [id number] ?Patient Account Number: [id number] ?Date of Birth/Sex: 04-Jul-1940 (81 y.o. M) ?Treating RN: Donnamarie Poag ?Primary Care Jalexus Brett: Elsie Stain Other Clinician: ?Referring Jeriyah Granlund: Elsie Stain ?Treating Genni Buske/Extender: Jeri Cos ?Weeks in Treatment: 11 ?Active Inactive ?Wound/Skin Impairment ?Nursing Diagnoses: ?Impaired tissue integrity ?Knowledge deficit related to smoking impact on wound healing ?Knowledge deficit related to ulceration/compromised skin integrity ?Goals: ?Patient/caregiver will verbalize understanding of skin care regimen ?Date Initiated: 02/27/2021 ?Date Inactivated: 03/27/2021 ?Target Resolution Date: 02/27/2021 ?Goal Status: Met ?Ulcer/skin breakdown will [name] a volume reduction of 30% by week 4 ?Date Initiated: 02/27/2021 ?Date Inactivated: 05/08/2021 ?Target Resolution Date: 03/27/2021 ?Goal Status: Met ?Ulcer/skin breakdown will [name] a volume reduction of 50% by week 8 ?Date Initiated: 02/27/2021 ?Target Resolution Date: 04/24/2021 ?Goal Status: Active ?Interventions: ?Assess ulceration(s) every visit ?Treatment Activities: ?Skin care regimen initiated : 02/27/2021 ?Topical wound management initiated : 02/27/2021 ?Notes: ?Electronic Signature(s) ?Signed: 05/15/2021 4:01:40 PM By: Donnamarie Poag ?Entered ByDonnamarie Poag on 05/15/2021 11:43:19 ?ODEAN, FESTER ([id number]) ?-------------------------------------------------------------------------------- ?Pain Assessment Details ?Patient Name: GOEBEL, HELLUMS ?Date of Service: 05/15/2021 11:30 AM ?Medical Record Number: [id number] ?Patient Account Number: [id number] ?Date of Birth/Sex: 1940/02/08 (81 y.o. M) ?Treating RN: Donnamarie Poag ?Primary Care Samara Stankowski: Elsie Stain Other  Clinician: ?Referring Eyad Rochford: Elsie Stain ?Treating Floreine Kingdon/Extender: Jeri Cos ?Weeks in Treatment: 11 ?Active Problems ?Location of Pain Severity and Description of Pain ?Patient Has Paino No ?Site Locations ?Rate the pain. ?Curre

## 2021-05-15 NOTE — Progress Notes (Addendum)
KAIRYN, OLMEDA ([id number]) ?Visit Report for 05/15/2021 ?Chief Complaint Document Details ?Patient Name: [name], [name] ?Date of Service: 05/15/2021 11:30 AM ?Medical Record Number: [id number] ?Patient Account Number: [id number] ?Date of Birth/Sex: [birth year] (81 y.o. M) ?Treating RN: Donnamarie Poag ?Primary Care Provider: Elsie Stain Other Clinician: ?Referring Provider: Elsie Stain ?Treating Provider/Extender: Jeri Cos ?Weeks in Treatment: 11 ?Information Obtained from: Patient ?Chief Complaint ?Left LE Ulcer ?Electronic Signature(s) ?Signed: 05/22/2021 3:51:41 PM By: Worthy Keeler PA-C ?Entered By: Worthy Keeler on 05/22/2021 15:51:41 ?[name], [name] ([id number]) ?-------------------------------------------------------------------------------- ?HPI Details ?Patient Name: [name], [name] ?Date of Service: 05/15/2021 11:30 AM ?Medical Record Number: [id number] ?Patient Account Number: [id number] ?Date of Birth/Sex: 02-Aug-1940 (81 y.o. M) ?Treating RN: Donnamarie Poag ?Primary Care Provider: Elsie Stain Other Clinician: ?Referring Provider: Elsie Stain ?Treating Provider/Extender: Jeri Cos ?Weeks in Treatment: 11 ?History of Present Illness ?HPI Description: 02/27/2021 upon evaluation today patient presents for initial evaluation here in the clinic sent concerning an issue on the left ?lateral heel which has been present currently for about 3 weeks that he notes. With that being said he did have an issue where he hit this area on ?a lawnmower over the summer 2022. Nonetheless the patient tells me that unfortunately though it did not seem to give him a lot of trouble ?immediately following this reopened more recently. He has not had any x-rays up to this time. 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 Subsequently he did have a fairly good ?arterial test today here in the clinic measured at 1.03 but I am somewhat cautious with that due to the fact that his extremity is very cold ?nonetheless he seems to have pretty good blood flow of the wound when I am cleaning this today. We will keep an eye on this and if I need to ?send him for formal arterial testing I will do so. ?03/13/2021 upon evaluation today patient appears to be doing well with regard to his wound. He has been tolerating the dressing changes without ?complication. Fortunately he does not show any signs of active infection at this time which is great news. No fevers, chills, nausea, vomiting, or ?diarrhea. With that being said his x-ray was negative for any signs of obvious osteomyelitis obviously an x-ray is not the best test for this but an ?MRI would be. Nonetheless I do not think that we need to jump straight to that at this point. ?03/27/2021 upon evaluation today patient actually appears to be doing extremely well in regard to his wounds. He has been tolerating the dressing ?changes on the left lateral ankle. I am actually extremely pleased with how this appears and I think that he is making excellent progress. ?Fortunately I do not see any evidence of active infection at this time which is great news. ?04/03/2021 upon evaluation today patient appears to be doing well with regard to his wound. Has been tolerating the dressing changes without ?complication. Fortunately there is no sign of active infection locally or systemically which is great news. No fevers, chills, nausea, vomiting, or ?diarrhea. ?04/08/2021 upon evaluation today patient appears to be doing poorly today in regard to his right heel which unfortunately has an open wound that ?was not present at the last evaluation. There  does not appear to be any signs of active infection locally or systemically at this time which is great ?news. No  fevers, chills, nausea, vomiting, or diarrhea. ?04/17/2021 upon evaluation patient appears to be doing well today in regard to his wound on both locations. I do feel like he is showing signs of ?improvement which is great news and overall I do feel like that were headed in the right direction reason silver collagen at both locations ?currently. ?04/24/2021 upon evaluation today patient appears to be doing well with regard to his wounds. We did get a denial for the skin itself unfortunately ?but the good news is he is actually showing some signs of improvement already so this is probably not even going to become necessary. ?05/01/2021 upon evaluation today patient's wounds are actually showing signs of excellent improvement. I am actually extremely pleased with ?where we stand today. I do not see any evidence of active infection locally nor systemically which is great news. No fevers, chills, nausea, ?vomiting, or diarrhea. ?05-08-2021 upon evaluation today patient appears to be doing well with regard to his wounds. Both are showing signs of improvement which is ?great news. Overall I am extremely pleased with where we stand today. There does not appear to be any signs of active infection locally nor ?systemically at this time ?05-22-2021 upon evaluation today patient appears to be doing well currently in regard to his wounds. They are actually showing signs of significant ?improvement which is great news both on the heel as well as on the ankle. I am happy with where things stand unfortunately he was not ?approved for the skin substitute but nonetheless I think that were still continuing to make progress here which is exactly what we are looking for. ?My hope is he will continue to show signs of improvement week by week till closure. ?Electronic Signature(s) ?Signed: 05/22/2021 3:51:47 PM By: Worthy Keeler PA-C ?Entered By: Worthy Keeler on 05/22/2021 15:51:47 ?[name], [name]  ([id number]) ?-------------------------------------------------------------------------------- ?Physical Exam Details ?Patient Name: [name], [name] ?Date of Service: 05/15/2021 11:30 AM ?Medical Record Number: [id number] ?Patient Account Number: [id number] ?Date of Birth/Sex: 07/05/1940 (81 y.o. M) ?Treating RN: Donnamarie Poag ?Primary Care Provider: Elsie Stain Other Clinician: ?Referring Provider: Elsie Stain ?Treating Provider/Extender: Jeri Cos ?Weeks in Treatment: 11 ?Constitutional ?Well-nourished and well-hydrated in no acute distress. ?Respiratory ?normal breathing without difficulty. ?Psychiatric ?this patient is able to make decisions and demonstrates good insight into disease process. Alert and Oriented x 3. pleasant and cooperative. ?Notes ?Upon inspection patient's wound bed actually showed signs of good granulation epithelization at this point. Fortunately I do not see any evidence ?of active infection locally nor systemically which is great news and overall very pleased with where we stand. ?Electronic Signature(s) ?Signed: 05/22/2021 3:52:03 PM By: Worthy Keeler PA-C ?Entered By: Worthy Keeler on 05/22/2021 15:52:03 ?[name], [name] ([id number]) ?-------------------------------------------------------------------------------- ?Physician Orders Details ?Patient Name: [name], [name] ?Date of Service: 05/15/2021 11:30 AM ?Medical Record Number: [id number] ?Patient Account Number: [id number] ?Date of Birth/Sex: [birth year] (81 y.o. M) ?Treating RN: Donnamarie Poag ?Primary Care Provider: Elsie Stain Other Clinician: ?Referring Provider: Elsie Stain ?Treating Provider/Extender: Jeri Cos ?Weeks in Treatment: 11 ?Verbal / Phone Orders: No ?Diagnosis Coding ?Follow-up Appointments ?Wound #1 Left,Lateral Ankle ?o Return Appointment in 1 week. ?o Nurse Visit as needed ?Bathing/ Shower/ Hygiene ?Wound #1 Left,Lateral Ankle ?o May shower with wound dressing protected with water repellent cover  or cast protector. ?o No tub bath. ?Anesthetic (Use 'Patient  Medications' Section for Anesthetic Order Entry) ?o Lidocaine applied to wound bed ?Edema Control - Lymphedema / Segmental Compressive Device / Other ?o Optional: One layer of unn

## 2021-05-21 ENCOUNTER — Telehealth: Payer: Self-pay | Admitting: Hematology and Oncology

## 2021-05-21 NOTE — Telephone Encounter (Signed)
Per 4/19 called and spoke to pt spouse.  She stated they were dealing with a death in the family and will call back when they can to reschedule  ?

## 2021-05-22 ENCOUNTER — Inpatient Hospital Stay: Payer: Medicare PPO

## 2021-05-22 ENCOUNTER — Encounter: Payer: Medicare PPO | Admitting: Physician Assistant

## 2021-05-22 ENCOUNTER — Inpatient Hospital Stay: Payer: Medicare PPO | Admitting: Hematology and Oncology

## 2021-05-23 ENCOUNTER — Encounter: Payer: Medicare PPO | Admitting: Physician Assistant

## 2021-05-23 DIAGNOSIS — G9009 Other idiopathic peripheral autonomic neuropathy: Secondary | ICD-10-CM | POA: Diagnosis not present

## 2021-05-23 DIAGNOSIS — L97412 Non-pressure chronic ulcer of right heel and midfoot with fat layer exposed: Secondary | ICD-10-CM | POA: Diagnosis not present

## 2021-05-23 DIAGNOSIS — L97422 Non-pressure chronic ulcer of left heel and midfoot with fat layer exposed: Secondary | ICD-10-CM | POA: Diagnosis not present

## 2021-05-23 DIAGNOSIS — Z856 Personal history of leukemia: Secondary | ICD-10-CM | POA: Diagnosis not present

## 2021-05-23 DIAGNOSIS — L97328 Non-pressure chronic ulcer of left ankle with other specified severity: Secondary | ICD-10-CM | POA: Diagnosis not present

## 2021-05-23 DIAGNOSIS — G6289 Other specified polyneuropathies: Secondary | ICD-10-CM | POA: Diagnosis not present

## 2021-05-23 NOTE — Progress Notes (Signed)
QUINCEY, QUESINBERRY ([id number]) ?Visit Report for 05/23/2021 ?Arrival Information Details ?Patient Name: [name], [name] ?Date of Service: 05/23/2021 3:15 PM ?Medical Record Number: [id number] ?Patient Account Number: [id number] ?Date of Birth/Sex: 1940-12-24 (81 y.o. M) ?Treating RN: Cornell Barman ?Primary Care Mace Weinberg: Elsie Stain Other Clinician: ?Referring Apolonio Cutting: Elsie Stain ?Treating Braniyah Besse/Extender: Jeri Cos ?Weeks in Treatment: 12 ?Visit Information History Since Last Visit ?Added or deleted any medications: No ?Patient Arrived: Wheel Chair ?Has Dressing in Place as Prescribed: Yes ?Arrival Time: 15:17 ?Has Compression in Place as Prescribed: Yes ?Accompanied By: son ?Pain Present Now: No ?Transfer Assistance: Manual ?Patient Identification Verified: Yes ?Secondary Verification Process Completed: Yes ?Patient Requires Transmission-Based No ?Precautions: ?Patient Has Alerts: Yes ?Patient Alerts: Patient on Blood ?Thinner ?81MG x 2 ?NOT DIABETIC ?Electronic Signature(s) ?Signed: 05/23/2021 4:32:16 PM By: Gretta Cool, BSN, RN, CWS, Kim RN, BSN ?Entered By: Gretta Cool, BSN, RN, CWS, Kim on 05/23/2021 15:20:32 ?AZAN, MANERI ([id number]) ?-------------------------------------------------------------------------------- ?Compression Therapy Details ?Patient Name: [name], [name] ?Date of Service: 05/23/2021 3:15 PM ?Medical Record Number: [id number] ?Patient Account Number: [id number] ?Date of Birth/Sex: 01-Apr-1940 (81 y.o. M) ?Treating RN: Cornell Barman ?Primary Care Toddy Boyd: Elsie Stain Other Clinician: ?Referring Mustafa Potts: Elsie Stain ?Treating Jordanne Elsbury/Extender: Jeri Cos ?Weeks in Treatment: 12 ?Compression Therapy Performed for Wound Assessment: Wound #1 Left,Lateral Ankle ?Performed By: Clinician Cornell Barman, RN ?Compression Type: Three Layer ?Pre Treatment ABI: 1 ?Post Procedure Diagnosis ?Same as Pre-procedure ?Electronic Signature(s) ?Signed: 05/23/2021 4:32:16 PM By: Gretta Cool, BSN, RN, CWS, Kim RN,  BSN ?Entered By: Gretta Cool, BSN, RN, CWS, Kim on 05/23/2021 15:51:08 ?COLLEEN, DONAHOE ([id number]) ?-------------------------------------------------------------------------------- ?Encounter Discharge Information Details ?Patient Name: [name], [name] ?Date of Service: 05/23/2021 3:15 PM ?Medical Record Number: [id number] ?Patient Account Number: [id number] ?Date of Birth/Sex: March 07, 1940 (81 y.o. M) ?Treating RN: Cornell Barman ?Primary Care Garek Schuneman: Elsie Stain Other Clinician: ?Referring Franny Selvage: Elsie Stain ?Treating Keneisha Heckart/Extender: Jeri Cos ?Weeks in Treatment: 12 ?Encounter Discharge Information Items ?Discharge Condition: Stable ?Ambulatory Status: Wheelchair ?Discharge Destination: Home ?Transportation: Private Auto ?Accompanied By: self ?Schedule Follow-up Appointment: Yes ?Clinical Summary of Care: ?Electronic Signature(s) ?Signed: 05/23/2021 4:32:16 PM By: Gretta Cool, BSN, RN, CWS, Kim RN, BSN ?Entered By: Gretta Cool, BSN, RN, CWS, Kim on 05/23/2021 16:15:19 ?ARTEMIO, DOBIE ([id number]) ?-------------------------------------------------------------------------------- ?Lower Extremity Assessment Details ?Patient Name: [name], [name] ?Date of Service: 05/23/2021 3:15 PM ?Medical Record Number: [id number] ?Patient Account Number: [id number] ?Date of Birth/Sex: [birth year] (81 y.o. M) ?Treating RN: Cornell Barman ?Primary Care Michial Disney: Elsie Stain Other Clinician: ?Referring Zachrey Deutscher: Elsie Stain ?Treating Sharmon Cheramie/Extender: Jeri Cos ?Weeks in Treatment: 12 ?Vascular Assessment ?Pulses: ?Dorsalis Pedis ?Palpable: [Left:Yes] [Right:Yes] ?Electronic Signature(s) ?Signed: 05/23/2021 4:32:16 PM By: Gretta Cool, BSN, RN, CWS, Kim RN, BSN ?Entered By: Gretta Cool, BSN, RN, CWS, Kim on 05/23/2021 15:33:06 ?SHAHAN, STARKS ([id number]) ?-------------------------------------------------------------------------------- ?Multi Wound Chart Details ?Patient Name: [name], [name] ?Date of Service: 05/23/2021 3:15 PM ?Medical Record  Number: [id number] ?Patient Account Number: [id number] ?Date of Birth/Sex: [birth year] (81 y.o. 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 N/A ?Date Acquired: 02/07/2021 04/05/2021 N/A ?Weeks of Treatment: 12 6 N/A ?Wound Status: Open Open N/A ?Wound Recurrence: No No N/A ?Measurements L x W x D (cm) 0.4x0.5x0.4  0.5x0.7x0.2 N/A ?Area (cm?) : 0.157 0.275 N/A ?Volume (cm?) : 0.063 0.055 N/A ?% Reduction in Area: 90.70% 22.10% N/A ?% Reduction in Volume: 92.60% -57.10% N/A ?Classification: Full Thickness With Exposed Full Thickness Without Exposed N/A ?Support Structures Support Structures ?Exudate Amount: Medium Medium N/A ?Exudate Type: Serosanguineous Serous N/A ?Exudate Color: red, brown amber N/A ?Treatment Notes ?Electronic Signature(s) ?Signed: 05/23/2021 4:32:16 PM By: Gretta Cool, BSN, RN, CWS, Kim RN, BSN ?Entered By: Gretta Cool, BSN, RN, CWS, Kim on 05/23/2021 15:50:44 ?[name], [name] ([id number]) ?-------------------------------------------------------------------------------- ?Multi-Disciplinary Care Plan Details ?Patient Name: [name], [name] ?Date of Service: 05/23/2021 3:15 PM ?Medical Record Number: [id number] ?Patient Account Number: [id number] ?Date of Birth/Sex: 19-Jul-1940 (81 y.o. M) ?Treating RN: Cornell Barman ?Primary Care Tamlyn Sides: Elsie Stain Other Clinician: ?Referring Lynnix Schoneman: Elsie Stain ?Treating Breanah Faddis/Extender: Jeri Cos ?Weeks in Treatment: 12 ?Active Inactive ?Wound/Skin  Impairment ?Nursing Diagnoses: ?Impaired tissue integrity ?Knowledge deficit related to smoking impact on wound healing ?Knowledge deficit related to ulceration/compromised skin integrity ?Goals: ?Patient/caregiver will verbalize understanding of skin care regimen ?Date Initiated: 02/27/2021 ?Date Inactivated: 03/27/2021 ?Target Resolution Date: 02/27/2021 ?Goal Status: Met ?Ulcer/skin breakdown will have a volume reduction of 30% by week 4 ?Date Initiated: 02/27/2021 ?Date Inactivated: 05/08/2021 ?Target Resolution Date: 03/27/2021 ?Goal Status: Met ?Ulcer/skin breakdown will have a volume reduction of 50% by week 8 ?Date Initiated: 02/27/2021 ?Target Resolution Date: 04/24/2021 ?Goal Status: Active ?Interventions: ?Assess ulceration(s) every visit ?Treatment Activities: ?Skin care regimen initiated : 02/27/2021 ?Topical wound management initiated : 02/27/2021 ?Notes: ?Electronic Signature(s) ?Signed: 05/23/2021 4:32:16 PM By: Gretta Cool, BSN, RN, CWS, Kim RN, BSN ?Entered By: Gretta Cool, BSN, RN, CWS, Kim on 05/23/2021 15:50:33 ?[name], [name] ([id number]) ?-------------------------------------------------------------------------------- ?Pain Assessment Details ?Patient Name: [name], [name] ?Date of Service: 05/23/2021 3:15 PM ?Medical Record Number: [id number] ?Patient Account Number: [id number] ?Date of Birth/Sex: September 23, 1940 (81 y.o. M) ?Treating RN: Cornell Barman ?Primary Care Sharlyne Koeneman: Elsie Stain Other Clinician: ?Referring Breelyn Icard: Elsie Stain ?Treating Jabin Tapp/Extender: Jeri Cos ?Weeks in Treatment: 12 ?Active Problems ?Location of Pain Severity and Description of Pain ?Patient Has Paino No ?Site Locations ?Pain Management and Medication ?Current Pain Management: ?Electronic Signature(s) ?Signed: 05/23/2021 4:32:16 PM By: Gretta Cool, BSN, RN, CWS, Kim RN, BSN ?Entered By: Gretta Cool, BSN, RN, CWS, Kim on 05/23/2021 15:21:12 ?[name], [name]  ([id number]) ?-------------------------------------------------------------------------------- ?Patient/Caregiver Education Details ?Patient Name: [name], DILLINGER ?Date of Service: 05/23/2021 3:15 PM ?Medical Record Number: [id number] ?Patient Account Number: [id number] ?Date of Birth/Gender: 04-14-1940 (81 y.o.

## 2021-05-23 NOTE — Progress Notes (Addendum)
[name], [name] ([id number]) ?Visit Report for 05/23/2021 ?Chief Complaint Document Details ?Patient Name: [name], [name] ?Date of Service: 05/23/2021 3:15 PM ?Medical Record Number: [id number] ?Patient Account Number: [id number] ?Date of Birth/Sex: 1941-01-28 (81 y.o. M) ?Treating RN: Cornell Barman ?Primary Care Provider: Elsie Stain Other Clinician: ?Referring Provider: Elsie Stain ?Treating Provider/Extender: Jeri Cos ?Weeks in Treatment: 12 ?Information Obtained from: Patient ?Chief Complaint ?Left LE Ulcer ?Electronic Signature(s) ?Signed: 05/23/2021 3:44:13 PM By: Worthy Keeler PA-C ?Entered By: Worthy Keeler on 05/23/2021 15:44:13 ?[name], [name] ([id number]) ?-------------------------------------------------------------------------------- ?HPI Details ?Patient Name: [name], [name] ?Date of Service: 05/23/2021 3:15 PM ?Medical Record Number: [id number] ?Patient Account Number: [id number] ?Date of Birth/Sex: [birth year] (81 y.o. M) ?Treating RN: Cornell Barman ?Primary Care Provider: Elsie Stain Other Clinician: ?Referring Provider: Elsie Stain ?Treating Provider/Extender: Jeri Cos ?Weeks in Treatment: 12 ?History of Present Illness ?HPI Description: 02/27/2021 upon evaluation today patient presents for initial evaluation here in the clinic sent concerning an issue on the left ?lateral heel which has been present currently for about 3 weeks that he notes. With that being said he did have an issue where he hit this area on ?a lawnmower over the summer 2022. Nonetheless the patient tells me that unfortunately though it did not seem to give him a lot of trouble ?immediately following this reopened more recently. He has not had any x-rays up to this time. 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 Subsequently he did have a fairly good ?arterial test today here in the clinic measured at 1.03 but I am somewhat cautious with that due to the fact that his extremity is very cold ?nonetheless he seems to have pretty good blood flow of the wound when I am cleaning this today. We will keep an eye on this and if I need to ?send him for formal arterial testing I will do so. ?03/13/2021 upon evaluation today patient appears to be doing well with regard to his wound. He has been tolerating the dressing changes without ?complication. Fortunately he does not show any signs of active infection at this time which is great news. No fevers, chills, nausea, vomiting, or ?diarrhea. With that being said his x-ray was negative for any signs of obvious osteomyelitis obviously an x-ray is not the best test for this but an ?MRI would be. Nonetheless I do not think that we need to jump straight to that at this point. ?03/27/2021 upon evaluation today patient actually appears to be doing extremely well in regard to his wounds. He has been tolerating the dressing ?changes on the left lateral ankle. I am actually extremely pleased with how this appears and I think that he is making excellent progress. ?Fortunately I do not see any evidence of active infection at this time which is great news. ?04/03/2021 upon evaluation today patient appears to be doing well with regard to his wound. Has been tolerating the dressing changes without ?complication. Fortunately there is no sign of active infection locally or systemically which is great news. No fevers, chills, nausea, vomiting, or ?diarrhea. ?04/08/2021 upon evaluation today patient appears to be doing poorly today in regard to his right heel which unfortunately has an open wound that ?was not present at the last evaluation. There  does not appear to be any signs of active infection locally or systemically at this time which is great ?news. No  fevers, chills, nausea, vomiting, or diarrhea. ?04/17/2021 upon evaluation patient appears to be doing well today in regard to his wound on both locations. I do feel like he is showing signs of ?improvement which is great news and overall I do feel like that were headed in the right direction reason silver collagen at both locations ?currently. ?04/24/2021 upon evaluation today patient appears to be doing well with regard to his wounds. We did get a denial for the skin itself unfortunately ?but the good news is he is actually showing some signs of improvement already so this is probably not even going to become necessary. ?05/01/2021 upon evaluation today patient's wounds are actually showing signs of excellent improvement. I am actually extremely pleased with ?where we stand today. I do not see any evidence of active infection locally nor systemically which is great news. No fevers, chills, nausea, ?vomiting, or diarrhea. ?05-08-2021 upon evaluation today patient appears to be doing well with regard to his wounds. Both are showing signs of improvement which is ?great news. Overall I am extremely pleased with where we stand today. There does not appear to be any signs of active infection locally nor ?systemically at this time ?05-22-2021 upon evaluation today patient appears to be doing well currently in regard to his wounds. They are actually showing signs of significant ?improvement which is great news both on the heel as well as on the ankle. I am happy with where things stand unfortunately he was not ?approved for the skin substitute but nonetheless I think that were still continuing to make progress here which is exactly what we are looking for. ?My hope is he will continue to show signs of improvement week by week till closure. ?05-23-2021 upon evaluation today patient appears to be doing well with regard to his wound. He has been tolerating the dressing changes without ?complication. Fortunately I do not see any  evidence of active infection at this time. No fevers, chills, nausea, vomiting, or diarrhea. ?Electronic Signature(s) ?Signed: 05/23/2021 4:37:11 PM By: Worthy Keeler PA-C ?Entered By: Worthy Keeler on 05/23/2021 16:37:11 ?[name], [name] ([id number]) ?-------------------------------------------------------------------------------- ?Physical Exam Details ?Patient Name: [name], [name] ?Date of Service: 05/23/2021 3:15 PM ?Medical Record Number: [id number] ?Patient Account Number: [id number] ?Date of Birth/Sex: [birth year] (81 y.o. M) ?Treating RN: Cornell Barman ?Primary Care Provider: Elsie Stain Other Clinician: ?Referring Provider: Elsie Stain ?Treating Provider/Extender: Jeri Cos ?Weeks in Treatment: 12 ?Constitutional ?Well-nourished and well-hydrated in no acute distress. ?Respiratory ?normal breathing without difficulty. ?Psychiatric ?this patient is able to make decisions and demonstrates good insight into disease process. Alert and Oriented x 3. pleasant and cooperative. ?Notes ?Patient's wounds are both showing signs of excellent improvement. I fortunately do not see any signs of infection locally or systemically which is ?great and overall I think that he is headed in the right direction. He has been using collagen currently. ?Electronic Signature(s) ?Signed: 05/23/2021 4:37:46 PM By: Worthy Keeler PA-C ?Previous Signature: 05/23/2021 4:37:34 PM Version By: Worthy Keeler PA-C ?Entered By: Worthy Keeler on 05/23/2021 16:37:46 ?[name], [name] ([id number]) ?-------------------------------------------------------------------------------- ?Physician Orders Details ?Patient Name: [name], [name] ?Date of Service: 05/23/2021 3:15 PM ?Medical Record Number: [id number] ?Patient Account Number: [id number] ?Date of Birth/Sex: 1940-06-22 (81 y.o. 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

## 2021-05-27 ENCOUNTER — Telehealth: Payer: Self-pay | Admitting: Hematology and Oncology

## 2021-05-27 NOTE — Telephone Encounter (Signed)
Per 4/25 in basket called and spoke to pt daughter and she confirmed appointment.  Pt daughter asked that her number be the number 1 number we call.  Change was made  ?

## 2021-05-29 ENCOUNTER — Encounter: Payer: Medicare PPO | Admitting: Internal Medicine

## 2021-05-29 DIAGNOSIS — L97422 Non-pressure chronic ulcer of left heel and midfoot with fat layer exposed: Secondary | ICD-10-CM | POA: Diagnosis not present

## 2021-05-29 DIAGNOSIS — Z856 Personal history of leukemia: Secondary | ICD-10-CM | POA: Diagnosis not present

## 2021-05-29 DIAGNOSIS — L97412 Non-pressure chronic ulcer of right heel and midfoot with fat layer exposed: Secondary | ICD-10-CM | POA: Diagnosis not present

## 2021-05-29 DIAGNOSIS — L97322 Non-pressure chronic ulcer of left ankle with fat layer exposed: Secondary | ICD-10-CM | POA: Diagnosis not present

## 2021-05-29 DIAGNOSIS — G9009 Other idiopathic peripheral autonomic neuropathy: Secondary | ICD-10-CM | POA: Diagnosis not present

## 2021-05-29 NOTE — Progress Notes (Signed)
[name], [name] ([id number]) ?Visit Report for 05/29/2021 ?HPI Details ?Patient Name: [name], [name] ?Date of Service: 05/29/2021 11:30 AM ?Medical Record Number: [id number] ?Patient Account Number: [id number] ?Date of Birth/Sex: 1940/07/24 (81 y.o. M) ?Treating RN: Donnamarie Poag ?Primary Care Provider: Elsie Stain Other Clinician: ?Referring Provider: Elsie Stain ?Treating Provider/Extender: Ricard Dillon ?Weeks in Treatment: 13 ?History of Present Illness ?HPI Description: 02/27/2021 upon evaluation today patient presents for initial evaluation here in the clinic sent concerning an issue on the left ?lateral heel which has been present currently for about 3 weeks that he notes. With that being said he did have an issue where he hit this area on ?a lawnmower over the summer 2022. Nonetheless the patient tells me that unfortunately though it did not seem to give him a lot of trouble ?immediately following this reopened more recently. He has not had any x-rays up to this time. 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 Subsequently he did have a fairly good ?arterial test today here in the clinic measured at 1.03 but I am somewhat cautious with that due to the fact that his extremity is very cold ?nonetheless he seems to have pretty good blood flow of the wound when I am cleaning this today. We will keep an eye on this and if I need to ?send him for formal arterial testing I will do so. ?03/13/2021 upon evaluation today patient appears to be doing well with regard to his wound. He has been tolerating the dressing changes without ?complication. Fortunately he does not show any signs of active infection at this time which is great news. No fevers, chills, nausea,  vomiting, or ?diarrhea. With that being said his x-ray was negative for any signs of obvious osteomyelitis obviously an x-ray is not the best test for this but an ?MRI would be. Nonetheless I do not think that we need to jump straight to that at this point. ?03/27/2021 upon evaluation today patient actually appears to be doing extremely well in regard to his wounds. He has been tolerating the dressing ?changes on the left lateral ankle. I am actually extremely pleased with how this appears and I think that he is making excellent progress. ?Fortunately I do not see any evidence of active infection at this time which is great news. ?04/03/2021 upon evaluation today patient appears to be doing well with regard to his wound. Has been tolerating the dressing changes without ?complication. Fortunately there is no sign of active infection locally or systemically which is great news. No fevers, chills, nausea, vomiting, or ?diarrhea. ?04/08/2021 upon evaluation today patient appears to be doing poorly today in regard to his right heel which unfortunately has an open wound that ?was not present at the last evaluation. There does not appear to be any signs of active infection locally or systemically at this time which is great ?news. No fevers, chills, nausea, vomiting, or diarrhea. ?04/17/2021 upon evaluation patient appears to be doing well today in regard to his wound on both locations. I do feel like he is showing signs of ?improvement which is great news and overall I do feel like that were headed in the right direction reason silver collagen at both locations ?currently. ?04/24/2021 upon evaluation today patient appears  to be doing well with regard to his wounds. We did get a denial for the skin itself unfortunately ?but the good news is he is actually showing some signs of improvement already so this is probably not even going to become necessary. ?05/01/2021 upon evaluation today patient's wounds are actually showing signs of  excellent improvement. I am actually extremely pleased with ?where we stand today. I do not see any evidence of active infection locally nor systemically which is great news. No fevers, chills, nausea, ?vomiting, or diarrhea. ?05-08-2021 upon evaluation today patient appears to be doing well with regard to his wounds. Both are showing signs of improvement which is ?great news. Overall I am extremely pleased with where we stand today. There does not appear to be any signs of active infection locally nor ?systemically at this time ?05-22-2021 upon evaluation today patient appears to be doing well currently in regard to his wounds. They are actually showing signs of significant ?improvement which is great news both on the heel as well as on the ankle. I am happy with where things stand unfortunately he was not ?approved for the skin substitute but nonetheless I think that were still continuing to make progress here which is exactly what we are looking for. ?My hope is he will continue to show signs of improvement week by week till closure. ?05-23-2021 upon evaluation today patient appears to be doing well with regard to his wound. He has been tolerating the dressing changes without ?complication. Fortunately I do not see any evidence of active infection at this time. No fevers, chills, nausea, vomiting, or diarrhea. ?4/23; right posterior heel and left lateral malleolus. Using collagen. The left is being wrapped 3 layer on the right changed by his son. Both ?wounds are doing very well ?Electronic Signature(s) ?Signed: 05/29/2021 4:18:52 PM By: Linton Ham MD ?Entered By: Linton Ham on 05/29/2021 11:53:31 ?[name], [name] ([id number]) ?-------------------------------------------------------------------------------- ?Physical Exam Details ?Patient Name: [name], [name] ?Date of Service: 05/29/2021 11:30 AM ?Medical Record Number: [id number] ?Patient Account Number: [id number] ?Date of Birth/Sex: 13-Aug-1940 (81 y.o.  M) ?Treating RN: Donnamarie Poag ?Primary Care Provider: Elsie Stain Other Clinician: ?Referring Provider: Elsie Stain ?Treating Provider/Extender: Ricard Dillon ?Weeks in Treatment: 13 ?Constitutional ?Sitting or standing Blood Pressure is within target range for patient.. Pulse regular and within target range for patient.Marland Kitchen Respirations regular, non- ?labored and within target range.. Temperature is normal and within the target range for the patient.Marland Kitchen appears in no distress. ?Cardiovascular ?Pedal pulses reduced but palpable. ?Notes ?Wound exam; both wounded areas right posterior calcaneus and left lateral malleolus have really healthy looking granulation and better ?measurements per our intake nurse. No evidence of surrounding infection ?Electronic Signature(s) ?Signed: 05/29/2021 4:18:52 PM By: Linton Ham MD ?Entered By: Linton Ham on 05/29/2021 11:55:05 ?[name], [name] ([id number]) ?-------------------------------------------------------------------------------- ?Physician Orders Details ?Patient Name: [name], [name] ?Date of Service: 05/29/2021 11:30 AM ?Medical Record Number: [id number] ?Patient Account Number: [id number] ?Date of Birth/Sex: 21-May-1940 (81 y.o. M) ?Treating RN: Donnamarie Poag ?Primary Care Provider: Elsie Stain Other Clinician: ?Referring Provider: Elsie Stain ?Treating Provider/Extender: Ricard Dillon ?Weeks in Treatment: 13 ?Verbal / Phone Orders: No ?Diagnosis Coding ?Follow-up Appointments ?Wound #1 Left,Lateral Ankle ?o Return Appointment in 1 week. ?o Nurse Visit as needed ?Bathing/ Shower/ Hygiene ?Wound #1 Left,Lateral Ankle ?o May shower with wound dressing protected with water repellent cover or cast protector. ?o No tub bath. ?Anesthetic (Use 'Patient Medications' Section for Anesthetic Order Entry) ?o Lidocaine applied to wound bed ?  Edema Control - Lymphedema / Segmental Compressive Device / Other ?o Elevate, Exercise Daily and Avoid Standing for  Long Periods of Time. ?o Elevate legs to the level of the heart and pump ankles as often as possible ?o Elevate leg(s) parallel to the floor when sitting. ?o DO YOUR BEST to sleep in the bed at nigh

## 2021-05-29 NOTE — Progress Notes (Signed)
WISDOM, RICKEY ([id number]) ?Visit Report for 05/29/2021 ?Arrival Information Details ?Patient Name: [name], [name] ?Date of Service: 05/29/2021 11:30 AM ?Medical Record Number: [id number] ?Patient Account Number: [id number] ?Date of Birth/Sex: [birth year] (81 y.o. M) ?Treating RN: Donnamarie Poag ?Primary Care Haillee Johann: Elsie Stain Other Clinician: ?Referring Festus Pursel: Elsie Stain ?Treating Aloysuis Ribaudo/Extender: Ricard Dillon ?Weeks in Treatment: 13 ?Visit Information History Since Last Visit ?Added or deleted any medications: No ?Patient Arrived: Wheel Chair ?Had a fall or experienced change in No ?Arrival Time: 11:33 ?activities of daily living that may affect ?Accompanied By: son ?risk of falls: ?Transfer Assistance: [employer] Patient Lift ?Hospitalized since last visit: No ?Patient Identification Verified: Yes ?Has Dressing in Place as Prescribed: Yes ?Secondary Verification Process Completed: Yes ?Has Compression in Place as Prescribed: Yes ?Patient Requires Transmission-Based No ?Pain Present Now: No ?Precautions: ?Patient Has Alerts: Yes ?Patient Alerts: Patient on Blood ?Thinner ?'81MG'$  x 2 ?NOT DIABETIC ?Electronic Signature(s) ?Signed: 05/29/2021 4:31:34 PM By: Donnamarie Poag ?Entered ByDonnamarie Poag on 05/29/2021 11:33:42 ?MARKEESE, BOYAJIAN ([id number]) ?-------------------------------------------------------------------------------- ?Compression Therapy Details ?Patient Name: [name], [name] ?Date of Service: 05/29/2021 11:30 AM ?Medical Record Number: [id number] ?Patient Account Number: [id number] ?Date of Birth/Sex: May 25, 1940 (81 y.o. M) ?Treating RN: Donnamarie Poag ?Primary Care Sheleen Conchas: Elsie Stain Other Clinician: ?Referring Carlo Guevarra: Elsie Stain ?Treating Loreal Schuessler/Extender: Ricard Dillon ?Weeks in Treatment: 13 ?Compression Therapy Performed for Wound Assessment: Wound #1 Left,Lateral Ankle ?Performed By: Clinician Donnamarie Poag, RN ?Compression Type: Three Layer ?Post Procedure Diagnosis ?Same as  Pre-procedure ?Electronic Signature(s) ?Signed: 05/29/2021 4:31:34 PM By: Donnamarie Poag ?Entered ByDonnamarie Poag on 05/29/2021 11:46:04 ?BRYOR, RAMI ([id number]) ?-------------------------------------------------------------------------------- ?Encounter Discharge Information Details ?Patient Name: [name], [name] ?Date of Service: 05/29/2021 11:30 AM ?Medical Record Number: [id number] ?Patient Account Number: [id number] ?Date of Birth/Sex: 1940-05-28 (81 y.o. M) ?Treating RN: Donnamarie Poag ?Primary Care Terry Abila: Elsie Stain Other Clinician: ?Referring Stony Stegmann: Elsie Stain ?Treating Jerone Cudmore/Extender: Ricard Dillon ?Weeks in Treatment: 13 ?Encounter Discharge Information Items ?Discharge Condition: Stable ?Ambulatory Status: Wheelchair ?Discharge Destination: Home ?Transportation: Private Auto ?Accompanied By: son ?Schedule Follow-up Appointment: Yes ?Clinical Summary of Care: ?Electronic Signature(s) ?Signed: 05/29/2021 4:31:34 PM By: Donnamarie Poag ?Entered ByDonnamarie Poag on 05/29/2021 11:49:22 ?PINCHAS, REITHER ([id number]) ?-------------------------------------------------------------------------------- ?Lower Extremity Assessment Details ?Patient Name: [name], [name] ?Date of Service: 05/29/2021 11:30 AM ?Medical Record Number: [id number] ?Patient Account Number: [id number] ?Date of Birth/Sex: [birth year] (81 y.o. M) ?Treating RN: Donnamarie Poag ?Primary Care Keishawn Rajewski: Elsie Stain Other Clinician: ?Referring Keland Peyton: Elsie Stain ?Treating Javaris Wigington/Extender: Ricard Dillon ?Weeks in Treatment: 13 ?Edema Assessment ?Assessed: [Left: Yes] [Right: Yes] ?[Left: Edema] [Right: :] ?Notes ?noted bilateral pedal edema and not lower legs ?Electronic Signature(s) ?Signed: 05/29/2021 4:31:34 PM By: Donnamarie Poag ?Entered ByDonnamarie Poag on 05/29/2021 11:43:37 ?ZIMRI, BRENNEN ([id number]) ?-------------------------------------------------------------------------------- ?Multi Wound Chart Details ?Patient Name:  [name], [name] ?Date of Service: 05/29/2021 11:30 AM ?Medical Record Number: [id number] ?Patient Account Number: [id number] ?Date of Birth/Sex: [birth year] (81 y.o. M) ?Treating RN: Donnamarie Poag ?Primary Care Citlalic Norlander: Elsie Stain Other Clinician: ?Referring Taiyo Kozma: Elsie Stain ?Treating Rosmarie Esquibel/Extender: Ricard Dillon ?Weeks in Treatment: 13 ?Vital Signs ?Height(in): 76 ?Pulse(bpm): 68 ?Weight(lbs): 200 ?Blood Pressure(mmHg): 144/74 ?Body Mass Index(BMI): 24.3 ?Temperature(??F): 98 ?Respiratory Rate(breaths/min): 16 ?Photos: [N/A:N/A] ?Wound Location: Left, Lateral Ankle Right Calcaneus N/A ?Wounding Event: Trauma Gradually Appeared N/A ?Primary Etiology: Neuropathic Ulcer-Non Diabetic Neuropathic Ulcer-Non Diabetic N/A ?Comorbid History: Cataracts, Neuropathy Cataracts, Neuropathy N/A ?Date Acquired: 02/07/2021 04/05/2021 N/A ?Weeks of Treatment: 13 7 N/A ?Wound Status: Open Open N/A ?  Wound Recurrence: No No N/A ?Measurements L x W x D (cm) 0.2x0.2x0.2 0.4x0.4x0.1 N/A ?Area (cm?) : 0.031 0.126 N/A ?Volume (cm?) : 0.006 0.013 N/A ?% Reduction in Area: 98.20% 64.30% N/A ?% Reduction in Volume: 99.30% 62.90% N/A ?Classification: Full Thickness With Exposed Full Thickness Without Exposed N/A ?Support Structures Support Structures ?Exudate Amount: Medium Medium N/A ?Exudate Type: Serosanguineous Serous N/A ?Exudate Color: red, brown amber N/A ?Granulation Amount: Large (67-100%) Large (67-100%) N/A ?Granulation Quality: Pink Red, Pink N/A ?Necrotic Amount: Small (1-33%) Small (1-33%) N/A ?Exposed Structures: ?Fat Layer (Subcutaneous Tissue): ?Fat Layer (Subcutaneous Tissue): N/A ?Yes Yes ?Fascia: No ?Fascia: No ?Tendon: No ?Tendon: No ?Muscle: No ?Muscle: No ?Joint: No ?Joint: No ?Bone: No ?Bone: No ?Treatment Notes ?Electronic Signature(s) ?Signed: 05/29/2021 4:31:34 PM By: Donnamarie Poag ?Entered ByDonnamarie Poag on 05/29/2021 11:45:40 ?[name], [name]  ([id number]) ?-------------------------------------------------------------------------------- ?Multi-Disciplinary Care Plan Details ?Patient Name: [name], [name] ?Date of Service: 05/29/2021 11:30 AM ?Medical Record Number: [id number] ?Patient Account Number: [id number] ?Date of Birth/Sex: 1940-05-21 (81 y.o. M) ?Treating RN: Donnamarie Poag ?Primary Care Isabella Ida: Elsie Stain Other Clinician: ?Referring Kirtan Sada: Elsie Stain ?Treating Josalin Carneiro/Extender: Ricard Dillon ?Weeks in Treatment: 13 ?Active Inactive ?Electronic Signature(s) ?Signed: 05/29/2021 4:31:34 PM By: Donnamarie Poag ?Entered ByDonnamarie Poag on 05/29/2021 11:45:33 ?[name], [name] ([id number]) ?-------------------------------------------------------------------------------- ?Pain Assessment Details ?Patient Name: [name], [name] ?Date of Service: 05/29/2021 11:30 AM ?Medical Record Number: [id number] ?Patient Account Number: [id number] ?Date of Birth/Sex: Apr 28, 1940 (81 y.o. M) ?Treating RN: Donnamarie Poag ?Primary Care Emylie Amster: Elsie Stain Other Clinician: ?Referring Korrine Sicard: Elsie Stain ?Treating Urias Sheek/Extender: Ricard Dillon ?Weeks in Treatment: 13 ?Active Problems ?Location of Pain Severity and Description of Pain ?Patient Has Paino No ?Site Locations ?Rate the pain. ?Current Pain Level: 0 ?Pain Management and Medication ?Current Pain Management: ?Electronic Signature(s) ?Signed: 05/29/2021 4:31:34 PM By: Donnamarie Poag ?Entered ByDonnamarie Poag on 05/29/2021 11:36:12 ?[name], [name] ([id number]) ?-------------------------------------------------------------------------------- ?Patient/Caregiver Education Details ?Patient Name: [name], [name] ?Date of Service: 05/29/2021 11:30 AM ?Medical Record Number: [id number] ?Patient Account Number: [id number] ?Date of Birth/Gender: 1940-05-02 (81 y.o. M) ?Treating RN: Donnamarie Poag ?Primary Care Physician: Elsie Stain Other Clinician: ?Referring Physician: Elsie Stain ?Treating  Physician/Extender: Ricard Dillon ?Weeks in Treatment: 13 ?Education Assessment ?Education Provided To: ?Patient and Caregiver ?Education Topics Provided ?Basic Hygiene: ?Nutrition: ?Offloading: ?Pressure: ?Venous: ?Wound/Skin Impairment: ?Electronic Signature(s) ?Signed: 4/2

## 2021-06-03 ENCOUNTER — Inpatient Hospital Stay: Payer: Medicare PPO | Attending: Hematology and Oncology

## 2021-06-03 ENCOUNTER — Other Ambulatory Visit: Payer: Self-pay

## 2021-06-03 ENCOUNTER — Encounter: Payer: Self-pay | Admitting: Hematology and Oncology

## 2021-06-03 ENCOUNTER — Inpatient Hospital Stay: Payer: Medicare PPO | Admitting: Hematology and Oncology

## 2021-06-03 DIAGNOSIS — Z7982 Long term (current) use of aspirin: Secondary | ICD-10-CM | POA: Insufficient documentation

## 2021-06-03 DIAGNOSIS — R600 Localized edema: Secondary | ICD-10-CM

## 2021-06-03 DIAGNOSIS — T380X5A Adverse effect of glucocorticoids and synthetic analogues, initial encounter: Secondary | ICD-10-CM | POA: Insufficient documentation

## 2021-06-03 DIAGNOSIS — D539 Nutritional anemia, unspecified: Secondary | ICD-10-CM | POA: Diagnosis not present

## 2021-06-03 DIAGNOSIS — G6181 Chronic inflammatory demyelinating polyneuritis: Secondary | ICD-10-CM

## 2021-06-03 DIAGNOSIS — Z7952 Long term (current) use of systemic steroids: Secondary | ICD-10-CM | POA: Diagnosis not present

## 2021-06-03 DIAGNOSIS — R634 Abnormal weight loss: Secondary | ICD-10-CM | POA: Insufficient documentation

## 2021-06-03 DIAGNOSIS — C91Z0 Other lymphoid leukemia not having achieved remission: Secondary | ICD-10-CM

## 2021-06-03 DIAGNOSIS — G720 Drug-induced myopathy: Secondary | ICD-10-CM | POA: Diagnosis not present

## 2021-06-03 DIAGNOSIS — Z79899 Other long term (current) drug therapy: Secondary | ICD-10-CM | POA: Diagnosis not present

## 2021-06-03 DIAGNOSIS — S81809A Unspecified open wound, unspecified lower leg, initial encounter: Secondary | ICD-10-CM

## 2021-06-03 DIAGNOSIS — D472 Monoclonal gammopathy: Secondary | ICD-10-CM

## 2021-06-03 LAB — COMPREHENSIVE METABOLIC PANEL
ALT: 43 U/L (ref 0–44)
AST: 30 U/L (ref 15–41)
Albumin: 3.5 g/dL (ref 3.5–5.0)
Alkaline Phosphatase: 97 U/L (ref 38–126)
Anion gap: 4 — ABNORMAL LOW (ref 5–15)
BUN: 20 mg/dL (ref 8–23)
CO2: 28 mmol/L (ref 22–32)
Calcium: 8.6 mg/dL — ABNORMAL LOW (ref 8.9–10.3)
Chloride: 109 mmol/L (ref 98–111)
Creatinine, Ser: 1.07 mg/dL (ref 0.61–1.24)
GFR, Estimated: >60 mL/min (ref >60)
Glucose, Bld: 98 mg/dL (ref 70–99)
Potassium: 3.8 mmol/L (ref 3.5–5.1)
Sodium: 141 mmol/L (ref 135–145)
Total Bilirubin: 1 mg/dL (ref 0.3–1.2)
Total Protein: 6.2 g/dL — ABNORMAL LOW (ref 6.5–8.1)

## 2021-06-03 LAB — CBC WITH DIFFERENTIAL/PLATELET
Abs Immature Granulocytes: 0.01 10*3/uL (ref 0.00–0.07)
Basophils Absolute: 0 10*3/uL (ref 0.0–0.1)
Basophils Relative: 1 %
Eosinophils Absolute: 0.1 10*3/uL (ref 0.0–0.5)
Eosinophils Relative: 3 %
HCT: 27.9 % — ABNORMAL LOW (ref 39.0–52.0)
Hemoglobin: 9.2 g/dL — ABNORMAL LOW (ref 13.0–17.0)
Immature Granulocytes: 0 %
Lymphocytes Relative: 71 %
Lymphs Abs: 2.9 10*3/uL (ref 0.7–4.0)
MCH: 39.1 pg — ABNORMAL HIGH (ref 26.0–34.0)
MCHC: 33 g/dL (ref 30.0–36.0)
MCV: 118.7 fL — ABNORMAL HIGH (ref 80.0–100.0)
Monocytes Absolute: 0.3 10*3/uL (ref 0.1–1.0)
Monocytes Relative: 7 %
Neutro Abs: 0.7 10*3/uL — ABNORMAL LOW (ref 1.7–7.7)
Neutrophils Relative %: 18 %
Platelets: 218 10*3/uL (ref 150–400)
RBC: 2.35 MIL/uL — ABNORMAL LOW (ref 4.22–5.81)
RDW: 16.2 % — ABNORMAL HIGH (ref 11.5–15.5)
WBC: 4 10*3/uL (ref 4.0–10.5)
nRBC: 0 % (ref 0.0–0.2)

## 2021-06-03 MED ORDER — PREDNISONE 5 MG PO TABS: ORAL_TABLET | ORAL | 1 refills | Status: DC

## 2021-06-03 MED ORDER — GABAPENTIN 300 MG PO CAPS: 600.0000 mg | ORAL_CAPSULE | Freq: Two times a day (BID) | ORAL | 3 refills | Status: DC

## 2021-06-03 MED ORDER — METHOTREXATE 2.5 MG PO TABS: 7.5000 mg | ORAL_TABLET | ORAL | 11 refills | Status: DC

## 2021-06-03 NOTE — Assessment & Plan Note (Signed)
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

## 2021-06-03 NOTE — Assessment & Plan Note (Signed)
He had chronic nonhealing wound bilaterally and is getting weekly wound care visits ?According to the patient and family, it is slowly healing ?I recommend reducing the dose of methotrexate as above that would hopefully improve his chances of rapid wound healing ?

## 2021-06-03 NOTE — Assessment & Plan Note (Signed)
The patient is stable on current dose of prednisone ?I renewed his prescription today ?

## 2021-06-03 NOTE — Assessment & Plan Note (Signed)
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

## 2021-06-03 NOTE — Assessment & Plan Note (Signed)
Due to recent weight loss and chronic nonhealing wound, I recommend reducing the dose of methotrexate from 7.5 mg weekly to 5 mg weekly ?He has appointment to see his primary care doctor in a few months ?He will continue current dose of prednisone ?I will see him again in 6 months for further follow up ?

## 2021-06-03 NOTE — Assessment & Plan Note (Signed)
He has slight worsening anemia due to recent weight loss and nonhealing wound ?As above, recommend reduction of dose of methotrexate ?

## 2021-06-03 NOTE — Progress Notes (Signed)
[facility] ?OFFICE PROGRESS NOTE ? ?Patient Care Team: ?Tonia Ghent, MD as PCP - General (Family Medicine) ?Estill Cotta, MD as Referring Physician (Ophthalmology) ? 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 ?Bilateral edema of lower extremity ?I suspect he has chronic edema due to fluid retention and poor mobility, along with poor circulation ?This predisposed him to chronic nonhealing wound ?He will continue wound care at wound care center ? ?Non-healing wound of lower extremity ?He had chronic nonhealing wound bilaterally and is getting weekly wound care visits ?According to the patient and family, it is slowly healing ?I recommend reducing the dose of methotrexate as above that would hopefully improve his chances of rapid wound healing ? ?Macrocytic anemia ?He has slight worsening anemia due to recent weight loss and nonhealing wound ?As above, recommend reduction of dose of methotrexate ? ?Steroid-induced myopathy ?He has signs of steroid myopathy as well as noticeable weight loss ?We discussed higher protein intake and graduated exercise as tolerated ? ?No orders of the defined types were placed in this encounter. ? ? ?All questions were answered. The patient knows to call the clinic with any problems, questions or concerns. ?The total time spent in the appointment was 40 minutes encounter with patients including review of chart and various tests results, discussions about plan of care and coordination of care  plan ?  ?Michael Lark, MD ?06/03/2021 10:29 AM ? ?INTERVAL HISTORY: ?Please see below for problem oriented charting. ?he returns for treatment follow-up -with his daughter, on chronic methotrexate for LGL and prednisone for CIDP ?Since last time I saw him, he has lost a lot of weight ?His appetite is reduced ?He has chronic bilateral nonhealing wound on both extremities since January and has been going to wound care center on a regular basis ?No recent falls ?He had an infection several months ago but nothing recently ?He sleeps intermittently during daytime but did not feel strongly that could be related to sedative side effects of gabapentin ?The patient denies any recent signs or symptoms of bleeding such as spontaneous epistaxis, hematuria or hematochezia. ? ? ?REVIEW OF SYSTEMS:   ?Constitutional: Denies fevers, chills  ?Eyes: Denies blurriness of vision ?Ears, nose, mouth, throat, and face: Denies mucositis or sore throat ?Respiratory: Denies cough, dyspnea or wheezes ?Cardiovascular: Denies palpitation, chest discomfort  ?Gastrointestinal:  Denies nausea, heartburn or change in bowel habits ?Skin: Denies abnormal skin rashes ?Lymphatics: Denies new lymphadenopathy or easy bruising ?Behavioral/Psych: Mood is stable, no new changes  ?All other systems were reviewed with the patient and are negative. ? ?I have reviewed the past medical history, past surgical history, social history and family history with the patient and they are unchanged from previous note. ? ?ALLERGIES:  is allergic to sulfa antibiotics and sulfonamide derivatives. ? ?MEDICATIONS:  ?Current Outpatient Medications  ?Medication Sig Dispense Refill  ? acetaminophen (TYLENOL) 325 MG tablet Take 650 mg by mouth every 6 (six) hours as needed.    ? aspirin EC 81 MG tablet Take 162 mg by mouth  daily.    ? CALCIUM CARB-CHOLECALCIFEROL PO Take 1,250 mg by mouth daily.    ? Cholecalciferol (VITAMIN D3) 25 MCG (1000 UT) CAPS Take 2 capsules by mouth daily.     ? Cyanocobalamin (VITAMIN B-12) 500 MCG SUBL Place 1 tablet under the tongue daily.     ? econazole nitrate 1 % cream Apply 1 application topically daily as needed.    ? finasteride (PROSCAR) 5 MG tablet Take 1 tablet (5 mg total) by mouth daily. 90 tablet 3  ? folic acid (FOLVITE) 588 MCG tablet Take 400 mcg by mouth daily.    ? gabapentin (NEURONTIN) 300 MG capsule Take 2 capsules (600 mg total) by mouth 2 (two) times daily. 360 capsule 3  ? methotrexate (RHEUMATREX) 2.5 MG tablet Take 3 tablets (7.5 mg total) by mouth once a week. Caution:Chemotherapy. Protect from light. 12 tablet 11  ? Multiple Vitamin (MULTIVITAMIN) tablet Take 1 tablet by mouth daily.      ? predniSONE (DELTASONE) 5 MG tablet TAKE 1 TABLET BY MOUTH ON MONDAYS, WEDS, AND FRIDAYS, AND TAKE 2 TABS OTHER DAYS OF THE WEEK 90 tablet 1  ? timolol (TIMOPTIC) 0.5 % ophthalmic solution Place 1 drop into both eyes at bedtime. One drop in each eye at bedtime.    ? triamcinolone (KENALOG) 0.025 % cream Apply 1 application topically as needed.    ? ?No current facility-administered medications for this visit.  ? ? 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 T-cell receptor rearrangement study is still pending ? ?  ?12/19/2015 -  Chemotherapy  ? The patient was started on 10 mg once a week methotrexate. The dose is increased to 12.5 mg starting 01/16/16 ? ? ?  ? ? ?PHYSICAL EXAMINATION: ?ECOG PERFORMANCE STATUS: 2 - Symptomatic, <50% confined to bed ? ?Vitals:  ? 06/03/21 0921  ?BP: 105/63  ?Pulse: 64  ?Resp: 18  ?Temp: 98.4 ?F (36.9 ?C)  ?SpO2: 100%  ? ?Filed Weights  ? 06/03/21 0921  ?Weight: 190 lb 1.6 oz (86.2 kg)  ? ? ?GENERAL:alert, no distress and comfortable.  Noted muscle wasting ?HEART: He has moderate bilateral lower extremity edema ?NEURO: alert & oriented x 3 with fluent speech ?LABORATORY DATA:  ?I have  reviewed the data as listed ?   ?Component Value Date/Time  ? NA 141 06/03/2021 0900  ? NA 138 12/15/2016 1319  ? K 3.8 06/03/2021 0900  ? K 4.1 12/15/2016 1319  ? CL 109 06/03/2021 0900  ? CO2 28 06/03/2021 0900  ? CO2 24 12/15/2016 1319  ? GLUCOSE 98 06/03/2021 0900  ? GLUCOSE 123 12/15/2016 1319  ? BUN 20 06/03/2021 0900  ? BUN 13.7 12/15/2016 1319  ? CREATININE 1.07 06/03/2021 0900  ? CREATININE 1.2 12/15/2016 1319  ? CALCIUM 8.6 (L) 06/03/2021 0900  ? CALCIUM 8.8 12/15/2016 1319  ? PROT 6.2 (L) 06/03/2021 0900  ? PROT 6.5 12/15/2016 1319  ? PROT 6.2 12/15/2016 1319  ? ALBUMIN 3.5 06/03/2021 0900  ? ALBUMIN 3.5 12/15/2016 1319  ? AST 30 06/03/2021 0900  ? AST 66 (H) 12/15/2016 1319  ? ALT 43 06/03/2021 0900  ? ALT 90 (H) 12/15/2016 1319  ? ALKPHOS 97 06/03/2021 0900  ? ALKPHOS 116 12/15/2016 1319  ? BILITOT 1.0 06/03/2021 0900  ? BILITOT 1.13 12/15/2016 1319  ? GFRNONAA >60 06/03/2021 0900  ? GFRAA >60 06/27/2019 0857  ? ? ?  No results found for: SPEP, UPEP ? ?Lab Results  ?Component Value Date  ? WBC 4.0 06/03/2021  ? NEUTROABS 0.7 (L) 06/03/2021  ? HGB 9.2 (L) 06/03/2021  ? HCT 27.9 (L) 06/03/2021  ? MCV 118.7 (H) 06/03/2021  ? PLT 218 06/03/2021  ? ? ?  Chemistry   ?   ?Component Value Date/Time  ? NA 141 06/03/2021 0900  ? NA 138 12/15/2016 1319  ? K 3.8 06/03/2021 0900  ? K 4.1 12/15/2016 1319  ? CL 109 06/03/2021 0900  ? CO2 28 06/03/2021 0900  ? CO2 24 12/15/2016 1319  ? BUN 20 06/03/2021 0900  ? BUN 13.7 12/15/2016 1319  ? CREATININE 1.07 06/03/2021 0900  ? CREATININE 1.2 12/15/2016 1319  ?    ?Component Value Date/Time  ? CALCIUM 8.6 (L) 06/03/2021 0900  ? CALCIUM 8.8 12/15/2016 1319  ? ALKPHOS 97 06/03/2021 0900  ? ALKPHOS 116 12/15/2016 1319  ? AST 30 06/03/2021 0900  ? AST 66 (H) 12/15/2016 1319  ? ALT 43 06/03/2021 0900  ? ALT 90 (H) 12/15/2016 1319  ? BILITOT 1.0 06/03/2021 0900  ? BILITOT 1.13 12/15/2016 1319  ?  ? ? ?

## 2021-06-04 LAB — KAPPA/LAMBDA LIGHT CHAINS
Kappa free light chain: 37.7 mg/L — ABNORMAL HIGH (ref 3.3–19.4)
Kappa, lambda light chain ratio: 5.31 — ABNORMAL HIGH (ref 0.26–1.65)
Lambda free light chains: 7.1 mg/L (ref 5.7–26.3)

## 2021-06-05 ENCOUNTER — Encounter: Payer: Medicare PPO | Attending: Physician Assistant | Admitting: Physician Assistant

## 2021-06-05 DIAGNOSIS — L97412 Non-pressure chronic ulcer of right heel and midfoot with fat layer exposed: Secondary | ICD-10-CM | POA: Diagnosis not present

## 2021-06-05 DIAGNOSIS — Z856 Personal history of leukemia: Secondary | ICD-10-CM | POA: Insufficient documentation

## 2021-06-05 DIAGNOSIS — L97422 Non-pressure chronic ulcer of left heel and midfoot with fat layer exposed: Secondary | ICD-10-CM | POA: Insufficient documentation

## 2021-06-05 DIAGNOSIS — G9009 Other idiopathic peripheral autonomic neuropathy: Secondary | ICD-10-CM | POA: Insufficient documentation

## 2021-06-05 DIAGNOSIS — L97322 Non-pressure chronic ulcer of left ankle with fat layer exposed: Secondary | ICD-10-CM | POA: Diagnosis not present

## 2021-06-05 NOTE — Progress Notes (Addendum)
RODRIGO, MCGRANAHAN ([id number]) ?Visit Report for 06/05/2021 ?Chief Complaint Document Details ?Patient Name: QUILL, GRINDER ?Date of Service: 06/05/2021 3:15 PM ?Medical Record Number: [id number] ?Patient Account Number: [id number] ?Date of Birth/Sex: 10-12-1940 (81 y.o. M) ?Treating RN: Donnamarie Poag ?Primary Care Provider: Elsie Stain Other Clinician: ?Referring Provider: Elsie Stain ?Treating Provider/Extender: Jeri Cos ?Weeks in Treatment: 14 ?Information Obtained from: Patient ?Chief Complaint ?Left LE Ulcer ?Electronic Signature(s) ?Signed: 06/05/2021 3:27:35 PM By: Worthy Keeler PA-C ?Entered By: Worthy Keeler on 06/05/2021 15:27:35 ?HAYDON, DORRIS ([id number]) ?-------------------------------------------------------------------------------- ?HPI Details ?Patient Name: ABU, HEAVIN ?Date of Service: 06/05/2021 3:15 PM ?Medical Record Number: [id number] ?Patient Account Number: [id number] ?Date of Birth/Sex: [birth year] (81 y.o. M) ?Treating RN: Donnamarie Poag ?Primary Care Provider: Elsie Stain Other Clinician: ?Referring Provider: Elsie Stain ?Treating Provider/Extender: Jeri Cos ?Weeks in Treatment: 14 ?History of Present Illness ?HPI Description: 02/27/2021 upon evaluation today patient presents for initial evaluation here in the clinic sent concerning an issue on the left ?lateral heel which has been present currently for about 3 weeks that he notes. With that being said he did have an issue where he hit this area on ?a lawnmower over the summer 2022. Nonetheless the patient tells me that unfortunately though it did not seem to give him a lot of trouble ?immediately following this reopened more recently. He has not had any x-rays up to this time. 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 He does not have any other major medical problems such as high blood pressure otherwise and again does not ?have diabetes though he does have significant neuropathy  secondary to his other chronic conditions. Subsequently he did have a fairly good ?arterial test today here in the clinic measured at 1.03 but I am somewhat cautious with that due to the fact that his extremity is very cold ?nonetheless he seems to have pretty good blood flow of the wound when I am cleaning this today. We will keep an eye on this and if I need to ?send him for formal arterial testing I will do so. ?03/13/2021 upon evaluation today patient appears to be doing well with regard to his wound. He has been tolerating the dressing changes without ?complication. Fortunately he does not show any signs of active infection at this time which is great news. No fevers, chills, nausea, vomiting, or ?diarrhea. With that being said his x-ray was negative for any signs of obvious osteomyelitis obviously an x-ray is not the best test for this but an ?MRI would be. Nonetheless I do not think that we need to jump straight to that at this point. ?03/27/2021 upon evaluation today patient actually appears to be doing extremely well in regard to his wounds. He has been tolerating the dressing ?changes on the left lateral ankle. I am actually extremely pleased with how this appears and I think that he is making excellent progress. ?Fortunately I do not see any evidence of active infection at this time which is great news. ?04/03/2021 upon evaluation today patient appears to be doing well with regard to his wound. Has been tolerating the dressing changes without ?complication. Fortunately there is no sign of active infection locally or systemically which is great news. No fevers, chills, nausea, vomiting, or ?diarrhea. ?04/08/2021 upon evaluation today patient appears to be doing poorly today in regard to his right heel which unfortunately has an open wound that ?was not present at the last evaluation. There  does not appear to be any signs of active infection locally or systemically at this time which is great ?news. No fevers,  chills, nausea, vomiting, or diarrhea. ?04/17/2021 upon evaluation patient appears to be doing well today in regard to his wound on both locations. I do feel like he is showing signs of ?improvement which is great news and overall I do feel like that were headed in the right direction reason silver collagen at both locations ?currently. ?04/24/2021 upon evaluation today patient appears to be doing well with regard to his wounds. We did get a denial for the skin itself unfortunately ?but the good news is he is actually showing some signs of improvement already so this is probably not even going to become necessary. ?05/01/2021 upon evaluation today patient's wounds are actually showing signs of excellent improvement. I am actually extremely pleased with ?where we stand today. I do not see any evidence of active infection locally nor systemically which is great news. No fevers, chills, nausea, ?vomiting, or diarrhea. ?05-08-2021 upon evaluation today patient appears to be doing well with regard to his wounds. Both are showing signs of improvement which is ?great news. Overall I am extremely pleased with where we stand today. There does not appear to be any signs of active infection locally nor ?systemically at this time ?05-22-2021 upon evaluation today patient appears to be doing well currently in regard to his wounds. They are actually showing signs of significant ?improvement which is great news both on the heel as well as on the ankle. I am happy with where things stand unfortunately he was not ?approved for the skin substitute but nonetheless I think that were still continuing to make progress here which is exactly what we are looking for. ?My hope is he will continue to show signs of improvement week by week till closure. ?05-23-2021 upon evaluation today patient appears to be doing well with regard to his wound. He has been tolerating the dressing changes without ?complication. Fortunately I do not see any evidence of  active infection at this time. No fevers, chills, nausea, vomiting, or diarrhea. ?4/23; right posterior heel and left lateral malleolus. Using collagen. The left is being wrapped 3 layer on the right changed by his son. Both ?wounds are doing very well ?06-05-2021 upon evaluation today patient appears to be doing well with regard to his wounds. Both are showing signs of improvement this is great ?news. Fortunately I do not see any evidence of active infection locally nor systemically which is great news. No fevers, chills, nausea, vomiting, or ?diarrhea. ?Electronic Signature(s) ?Signed: 06/05/2021 4:15:03 PM By: Worthy Keeler PA-C ?Entered By: Worthy Keeler on 06/05/2021 16:15:02 ?EYTAN, CARRIGAN ([id number]) ?-------------------------------------------------------------------------------- ?Physical Exam Details ?Patient Name: PERCIVAL, GLASHEEN ?Date of Service: 06/05/2021 3:15 PM ?Medical Record Number: [id number] ?Patient Account Number: [id number] ?Date of Birth/Sex: 08-20-1940 (81 y.o. M) ?Treating RN: Donnamarie Poag ?Primary Care Provider: Elsie Stain Other Clinician: ?Referring Provider: Elsie Stain ?Treating Provider/Extender: Jeri Cos ?Weeks in Treatment: 14 ?Constitutional ?Well-nourished and well-hydrated in no acute distress. ?Respiratory ?normal breathing without difficulty. ?Psychiatric ?this patient is able to make decisions and demonstrates good insight into disease process. Alert and Oriented x 3. pleasant and cooperative. ?Notes ?Upon inspection patient's wound bed showed evidence of good granulation and epithelization at this point. Fortunately I do not see any evidence ?of active infection locally or systemically which is great news and overall I feel like we are on the right track here. No fevers,  chills, nausea, ?vomiting, or diarrhea. ?Electronic Signature(s) ?Signed: 06/05/2021 4:15:20 PM By: Worthy Keeler PA-C ?Entered By: Worthy Keeler on 06/05/2021 16:15:20 ?KAYD, LAUNER  ([id number]) ?-------------------------------------------------------------------------------- ?Physician Orders Details ?Patient Name: JAKSEN, FIORELLA ?Date of Service: 06/05/2021 3:15 PM ?Medical Record Number:

## 2021-06-05 NOTE — Progress Notes (Signed)
LONNY, EISEN ([id number]) ?Visit Report for 06/05/2021 ?Arrival Information Details ?Patient Name: [name], [name] ?Date of Service: 06/05/2021 3:15 PM ?Medical Record Number: [id number] ?Patient Account Number: [id number] ?Date of Birth/Sex: 11-20-1940 (81 y.o. M) ?Treating RN: Donnamarie Poag ?Primary Care Lacresia Darwish: Elsie Stain Other Clinician: ?Referring Keymarion Bearman: Elsie Stain ?Treating Shilee Biggs/Extender: Jeri Cos ?Weeks in Treatment: 14 ?Visit Information History Since Last Visit ?Added or deleted any medications: No ?Patient Arrived: Wheel Chair ?Had a fall or experienced change in No ?Arrival Time: 15:20 ?activities of daily living that may affect ?Accompanied By: son ?risk of falls: ?Transfer Assistance: EasyPivot Patient Lift ?Hospitalized since last visit: No ?Patient Identification Verified: Yes ?Has Dressing in Place as Prescribed: Yes ?Secondary Verification Process Completed: Yes ?Has Compression in Place as Prescribed: Yes ?Patient Requires Transmission-Based No ?Pain Present Now: No ?Precautions: ?Patient Has Alerts: Yes ?Patient Alerts: Patient on Blood ?Thinner ?'81MG'$  x 2 ?NOT DIABETIC ?Electronic Signature(s) ?Signed: 06/05/2021 4:00:30 PM By: Donnamarie Poag ?Entered ByDonnamarie Poag on 06/05/2021 15:21:13 ?[name], [name] ([id number]) ?-------------------------------------------------------------------------------- ?Clinic Level of Care Assessment Details ?Patient Name: [name], [name] ?Date of Service: 06/05/2021 3:15 PM ?Medical Record Number: [id number] ?Patient Account Number: [id number] ?Date of Birth/Sex: 10-26-1940 (81 y.o. M) ?Treating RN: Donnamarie Poag ?Primary Care Abron Neddo: Elsie Stain Other Clinician: ?Referring Seung Nidiffer: Elsie Stain ?Treating Maelyn Berrey/Extender: Jeri Cos ?Weeks in Treatment: 14 ?Clinic Level of Care Assessment Items ?TOOL 4 Quantity Score ?'[]'$  - Use when only an EandM is performed on FOLLOW-UP visit 0 ?ASSESSMENTS - Nursing Assessment / Reassessment ?'[]'$  - Reassessment  of Co-morbidities (includes updates in patient status) 0 ?'[]'$  - 0 ?Reassessment of Adherence to Treatment Plan ?ASSESSMENTS - Wound and Skin Assessment / Reassessment ?'[]'$  - Simple Wound Assessment / Reassessment - one wound 0 ?X- 2 5 ?Complex Wound Assessment / Reassessment - multiple wounds ?'[]'$  - 0 ?Dermatologic / Skin Assessment (not related to wound area) ?ASSESSMENTS - Focused Assessment ?'[]'$  - Circumferential Edema Measurements - multi extremities 0 ?'[]'$  - 0 ?Nutritional Assessment / Counseling / Intervention ?'[]'$  - 0 ?Lower Extremity Assessment (monofilament, tuning fork, pulses) ?'[]'$  - 0 ?Peripheral Arterial Disease Assessment (using hand held doppler) ?ASSESSMENTS - Ostomy and/or Continence Assessment and Care ?'[]'$  - Incontinence Assessment and Management 0 ?'[]'$  - 0 ?Ostomy Care Assessment and Management (repouching, etc.) ?PROCESS - Coordination of Care ?X - Simple Patient / Family Education for ongoing care 1 15 ?'[]'$  - 0 ?Complex (extensive) Patient / Family Education for ongoing care ?'[]'$  - 0 ?Staff obtains Consents, Records, Test Results / Process Orders ?'[]'$  - 0 ?Staff telephones HHA, Nursing Homes / Clarify orders / etc ?'[]'$  - 0 ?Routine Transfer to another Facility (non-emergent condition) ?'[]'$  - 0 ?Routine Hospital Admission (non-emergent condition) ?'[]'$  - 0 ?New Admissions / Biomedical engineer / Ordering NPWT, Apligraf, etc. ?'[]'$  - 0 ?Emergency Hospital Admission (emergent condition) ?X- 1 10 ?Simple Discharge Coordination ?'[]'$  - 0 ?Complex (extensive) Discharge Coordination ?PROCESS - Special Needs ?'[]'$  - Pediatric / Minor Patient Management 0 ?'[]'$  - 0 ?Isolation Patient Management ?'[]'$  - 0 ?Hearing / Language / Visual special needs ?'[]'$  - 0 ?Assessment of Community assistance (transportation, D/C planning, etc.) ?'[]'$  - 0 ?Additional assistance / Altered mentation ?'[]'$  - 0 ?Support Surface(s) Assessment (bed, cushion, seat, etc.) ?INTERVENTIONS - Wound Cleansing / Measurement ?[name], [name] ([id number]) ?'[]'$  -  0 ?Simple Wound Cleansing - one wound ?X- 2 5 ?Complex Wound Cleansing - multiple wounds ?X- 1 5 ?Wound Imaging (photographs - any number of wounds) ?'[]'$  - 0 ?Wound  Tracing (instead of photographs) ?'[]'$  - 0 ?Simple Wound Measurement - one wound ?X- 2 5 ?Complex Wound Measurement - multiple wounds ?INTERVENTIONS - Wound Dressings ?X - Small Wound Dressing one or multiple wounds 2 10 ?'[]'$  - 0 ?Medium Wound Dressing one or multiple wounds ?'[]'$  - 0 ?Large Wound Dressing one or multiple wounds ?X- 1 5 ?Application of Medications - topical ?'[]'$  - 0 ?Application of Medications - injection ?INTERVENTIONS - Miscellaneous ?'[]'$  - External ear exam 0 ?'[]'$  - 0 ?Specimen Collection (cultures, biopsies, blood, body fluids, etc.) ?'[]'$  - 0 ?Specimen(s) / Culture(s) sent or taken to Lab for analysis ?'[]'$  - 0 ?Patient Transfer (multiple staff / Civil Service fast streamer / Similar devices) ?'[]'$  - 0 ?Simple Staple / Suture removal (25 or less) ?'[]'$  - 0 ?Complex Staple / Suture removal (26 or more) ?'[]'$  - 0 ?Hypo / Hyperglycemic Management (close monitor of Blood Glucose) ?'[]'$  - 0 ?Ankle / Brachial Index (ABI) - do not check if billed separately ?X- 1 5 ?Vital Signs ?Has the patient been seen at the hospital within the last three years: Yes ?Total Score: 90 ?Level Of Care: New/Established - Level ?3 ?Electronic Signature(s) ?Signed: 06/05/2021 4:00:30 PM By: Donnamarie Poag ?Entered ByDonnamarie Poag on 06/05/2021 15:39:08 ?[name], [name] ([id number]) ?-------------------------------------------------------------------------------- ?Encounter Discharge Information Details ?Patient Name: [name], [name] ?Date of Service: 06/05/2021 3:15 PM ?Medical Record Number: [id number] ?Patient Account Number: [id number] ?Date of Birth/Sex: May 15, 1940 (81 y.o. M) ?Treating RN: Donnamarie Poag ?Primary Care Auston Halfmann: Elsie Stain Other Clinician: ?Referring Scharlene Catalina: Elsie Stain ?Treating Shirlyn Savin/Extender: Jeri Cos ?Weeks in Treatment: 14 ?Encounter Discharge Information  Items ?Discharge Condition: Stable ?Ambulatory Status: Wheelchair ?Discharge Destination: Home ?Transportation: Private Auto ?Accompanied By: son ?Schedule Follow-up Appointment: Yes ?Clinical Summary of Care: ?Electronic Signature(s) ?Signed: 06/05/2021 4:00:30 PM By: Donnamarie Poag ?Entered ByDonnamarie Poag on 06/05/2021 15:40:01 ?[name], [name] ([id number]) ?-------------------------------------------------------------------------------- ?Lower Extremity Assessment Details ?Patient Name: [name], [name] ?Date of Service: 06/05/2021 3:15 PM ?Medical Record Number: [id number] ?Patient Account Number: [id number] ?Date of Birth/Sex: 1940-08-23 (81 y.o. M) ?Treating RN: Donnamarie Poag ?Primary Care Shenetta Schnackenberg: Elsie Stain Other Clinician: ?Referring Miu Chiong: Elsie Stain ?Treating Marylouise Mallet/Extender: Jeri Cos ?Weeks in Treatment: 14 ?Edema Assessment ?Assessed: [Left: Yes] [Right: Yes] ?Edema: [Left: No] [Right: No] ?Electronic Signature(s) ?Signed: 06/05/2021 4:00:30 PM By: Donnamarie Poag ?Entered ByDonnamarie Poag on 06/05/2021 15:30:38 ?JEFFRY, [name] ([id number]) ?-------------------------------------------------------------------------------- ?Multi Wound Chart Details ?Patient Name: [name], [name] ?Date of Service: 06/05/2021 3:15 PM ?Medical Record Number: [id number] ?Patient Account Number: [id number] ?Date of Birth/Sex: 09/27/1940 (81 y.o. 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

## 2021-06-06 LAB — MULTIPLE MYELOMA PANEL, SERUM
Albumin SerPl Elph-Mcnc: 3.2 g/dL (ref 2.9–4.4)
Albumin/Glob SerPl: 1.2 (ref 0.7–1.7)
Alpha 1: 0.2 g/dL (ref 0.0–0.4)
Alpha2 Glob SerPl Elph-Mcnc: 0.5 g/dL (ref 0.4–1.0)
B-Globulin SerPl Elph-Mcnc: 0.6 g/dL — ABNORMAL LOW (ref 0.7–1.3)
Gamma Glob SerPl Elph-Mcnc: 1.6 g/dL (ref 0.4–1.8)
Globulin, Total: 2.9 g/dL (ref 2.2–3.9)
IgA: 18 mg/dL — ABNORMAL LOW (ref 61–437)
IgG (Immunoglobin G), Serum: 1633 mg/dL — ABNORMAL HIGH (ref 603–1613)
IgM (Immunoglobulin M), Srm: 13 mg/dL — ABNORMAL LOW (ref 15–143)
M Protein SerPl Elph-Mcnc: 1.2 g/dL — ABNORMAL HIGH
Total Protein ELP: 6.1 g/dL (ref 6.0–8.5)

## 2021-06-09 ENCOUNTER — Other Ambulatory Visit: Payer: Self-pay | Admitting: Hematology and Oncology

## 2021-06-10 ENCOUNTER — Telehealth: Payer: Self-pay

## 2021-06-10 ENCOUNTER — Other Ambulatory Visit: Payer: Self-pay | Admitting: Hematology and Oncology

## 2021-06-10 MED ORDER — METHOTREXATE 2.5 MG PO TABS: 5.0000 mg | ORAL_TABLET | ORAL | 11 refills | Status: DC

## 2021-06-10 NOTE — Telephone Encounter (Signed)
Returned call to daughter and told Dr. Alvy Bimler sent the methotrexate Rx to the pharmacy. Daughter verbalized understanding.           ?

## 2021-06-12 ENCOUNTER — Encounter: Payer: Medicare PPO | Admitting: Physician Assistant

## 2021-06-12 DIAGNOSIS — L97422 Non-pressure chronic ulcer of left heel and midfoot with fat layer exposed: Secondary | ICD-10-CM | POA: Diagnosis not present

## 2021-06-12 DIAGNOSIS — Z856 Personal history of leukemia: Secondary | ICD-10-CM | POA: Diagnosis not present

## 2021-06-12 DIAGNOSIS — G9009 Other idiopathic peripheral autonomic neuropathy: Secondary | ICD-10-CM | POA: Diagnosis not present

## 2021-06-12 DIAGNOSIS — L97322 Non-pressure chronic ulcer of left ankle with fat layer exposed: Secondary | ICD-10-CM | POA: Diagnosis not present

## 2021-06-12 DIAGNOSIS — L97412 Non-pressure chronic ulcer of right heel and midfoot with fat layer exposed: Secondary | ICD-10-CM | POA: Diagnosis not present

## 2021-06-12 NOTE — Progress Notes (Signed)
[name] ([id number]) ?Visit Report for 06/12/2021 ?Arrival Information Details ?Patient Name: [name], [name] ?Date of Service: 06/12/2021 11:30 AM ?Medical Record Number: [id number] ?Patient Account Number: [id number] ?Date of Birth/Sex: 04-15-1940 (81 y.o. M) ?Treating RN: Donnamarie Poag ?Primary Care Issis Lindseth: Elsie Stain Other Clinician: ?Referring Delorean Knutzen: Elsie Stain ?Treating Leeah Politano/Extender: Jeri Cos ?Weeks in Treatment: 15 ?Visit Information History Since Last Visit ?Added or deleted any medications: No ?Patient Arrived: Wheel Chair ?Had a fall or experienced change in No ?Arrival Time: 11:31 ?activities of daily living that may affect ?Accompanied By: son ?risk of falls: ?Transfer Assistance: Easy[employer] Patient Lift ?Hospitalized since last visit: No ?Patient Identification Verified: Yes ?Has Dressing in Place as Prescribed: Yes ?Secondary Verification Process Completed: Yes ?Has Compression in Place as Prescribed: Yes ?Patient Requires Transmission-Based No ?Pain Present Now: No ?Precautions: ?Patient Has Alerts: Yes ?Patient Alerts: Patient on Blood ?Thinner ?'81MG'$  x 2 ?NOT DIABETIC ?Electronic Signature(s) ?Signed: 06/12/2021 3:52:15 PM By: Donnamarie Poag ?Entered ByDonnamarie Poag on 06/12/2021 11:32:13 ?[name], [name] ([id number]) ?-------------------------------------------------------------------------------- ?Clinic Level of Care Assessment Details ?Patient Name: [name] ?Date of Service: 06/12/2021 11:30 AM ?Medical Record Number: [id number] ?Patient Account Number: [id number] ?Date of Birth/Sex: 1940-06-22 (81 y.o. M) ?Treating RN: Donnamarie Poag ?Primary Care Paulene Tayag: Elsie Stain Other Clinician: ?Referring Divante Kotch: Elsie Stain ?Treating Latoiya Maradiaga/Extender: Jeri Cos ?Weeks in Treatment: 15 ?Clinic Level of Care Assessment Items ?TOOL 4 Quantity Score ?'[]'$  - Use when only an EandM is performed on FOLLOW-UP visit 0 ?ASSESSMENTS - Nursing Assessment / Reassessment ?'[]'$  -  Reassessment of Co-morbidities (includes updates in patient status) 0 ?'[]'$  - 0 ?Reassessment of Adherence to Treatment Plan ?ASSESSMENTS - Wound and Skin Assessment / Reassessment ?'[]'$  - Simple Wound Assessment / Reassessment - one wound 0 ?X- 2 5 ?Complex Wound Assessment / Reassessment - multiple wounds ?'[]'$  - 0 ?Dermatologic / Skin Assessment (not related to wound area) ?ASSESSMENTS - Focused Assessment ?'[]'$  - Circumferential Edema Measurements - multi extremities 0 ?'[]'$  - 0 ?Nutritional Assessment / Counseling / Intervention ?'[]'$  - 0 ?Lower Extremity Assessment (monofilament, tuning fork, pulses) ?'[]'$  - 0 ?Peripheral Arterial Disease Assessment (using hand held doppler) ?ASSESSMENTS - Ostomy and/or Continence Assessment and Care ?'[]'$  - Incontinence Assessment and Management 0 ?'[]'$  - 0 ?Ostomy Care Assessment and Management (repouching, etc.) ?PROCESS - Coordination of Care ?X - Simple Patient / Family Education for ongoing care 1 15 ?'[]'$  - 0 ?Complex (extensive) Patient / Family Education for ongoing care ?'[]'$  - 0 ?Staff obtains Consents, Records, Test Results / Process Orders ?'[]'$  - 0 ?Staff telephones HHA, Nursing Homes / Clarify orders / etc ?'[]'$  - 0 ?Routine Transfer to another Facility (non-emergent condition) ?'[]'$  - 0 ?Routine Hospital Admission (non-emergent condition) ?'[]'$  - 0 ?New Admissions / Biomedical engineer / Ordering NPWT, Apligraf, etc. ?'[]'$  - 0 ?Emergency Hospital Admission (emergent condition) ?X- 1 10 ?Simple Discharge Coordination ?'[]'$  - 0 ?Complex (extensive) Discharge Coordination ?PROCESS - Special Needs ?'[]'$  - Pediatric / Minor Patient Management 0 ?'[]'$  - 0 ?Isolation Patient Management ?'[]'$  - 0 ?Hearing / Language / Visual special needs ?'[]'$  - 0 ?Assessment of Community assistance (transportation, D/C planning, etc.) ?'[]'$  - 0 ?Additional assistance / Altered mentation ?'[]'$  - 0 ?Support Surface(s) Assessment (bed, cushion, seat, etc.) ?INTERVENTIONS - Wound Cleansing / Measurement ?[name], [name]  ([id number]) ?X- 1 5 ?Simple Wound Cleansing - one wound ?'[]'$  - 0 ?Complex Wound Cleansing - multiple wounds ?X- 1 5 ?Wound Imaging (photographs - any number of wounds) ?'[]'$  - 0 ?Wound  Tracing (instead of photographs) ?X- 1 5 ?Simple Wound Measurement - one wound ?'[]'$  - 0 ?Complex Wound Measurement - multiple wounds ?INTERVENTIONS - Wound Dressings ?X - Small Wound Dressing one or multiple wounds 2 10 ?'[]'$  - 0 ?Medium Wound Dressing one or multiple wounds ?'[]'$  - 0 ?Large Wound Dressing one or multiple wounds ?X- 1 5 ?Application of Medications - topical ?'[]'$  - 0 ?Application of Medications - injection ?INTERVENTIONS - Miscellaneous ?'[]'$  - External ear exam 0 ?'[]'$  - 0 ?Specimen Collection (cultures, biopsies, blood, body fluids, etc.) ?'[]'$  - 0 ?Specimen(s) / Culture(s) sent or taken to Lab for analysis ?'[]'$  - 0 ?Patient Transfer (multiple staff / Civil Service fast streamer / Similar devices) ?'[]'$  - 0 ?Simple Staple / Suture removal (25 or less) ?'[]'$  - 0 ?Complex Staple / Suture removal (26 or more) ?'[]'$  - 0 ?Hypo / Hyperglycemic Management (close monitor of Blood Glucose) ?'[]'$  - 0 ?Ankle / Brachial Index (ABI) - do not check if billed separately ?X- 1 5 ?Vital Signs ?Has the patient been seen at the hospital within the last three years: Yes ?Total Score: 80 ?Level Of Care: New/Established - Level ?3 ?Electronic Signature(s) ?Signed: 06/12/2021 3:52:15 PM By: Donnamarie Poag ?Entered ByDonnamarie Poag on 06/12/2021 12:02:59 ?[name], [name] ([id number]) ?-------------------------------------------------------------------------------- ?Encounter Discharge Information Details ?Patient Name: [name], [name] ?Date of Service: 06/12/2021 11:30 AM ?Medical Record Number: [id number] ?Patient Account Number: [id number] ?Date of Birth/Sex: Jun 15, 1940 (81 y.o. M) ?Treating RN: Donnamarie Poag ?Primary Care Drury Ardizzone: Elsie Stain Other Clinician: ?Referring Remington Skalsky: Elsie Stain ?Treating Aerilynn Goin/Extender: Jeri Cos ?Weeks in Treatment: 15 ?Encounter Discharge  Information Items ?Discharge Condition: Stable ?Ambulatory Status: Wheelchair ?Discharge Destination: Home ?Transportation: Private Auto ?Accompanied By: son ?Schedule Follow-up Appointment: Yes ?Clinical Summary of Care: ?Electronic Signature(s) ?Signed: 06/12/2021 3:52:15 PM By: Donnamarie Poag ?Entered ByDonnamarie Poag on 06/12/2021 12:11:50 ?[name], [name] ([id number]) ?-------------------------------------------------------------------------------- ?Lower Extremity Assessment Details ?Patient Name: [name], [name] ?Date of Service: 06/12/2021 11:30 AM ?Medical Record Number: [id number] ?Patient Account Number: [id number] ?Date of Birth/Sex: Nov 14, 1940 (81 y.o. M) ?Treating RN: Donnamarie Poag ?Primary Care Argus Caraher: Elsie Stain Other Clinician: ?Referring Gauri Galvao: Elsie Stain ?Treating Nare Gaspari/Extender: Jeri Cos ?Weeks in Treatment: 15 ?Edema Assessment ?Assessed: [Left: Yes] [Right: Yes] ?[Left: Edema] [Right: :] ?Ankle ?Left: Right: ?Point of Measurement: From Medial Instep 26 cm 25.5 cm ?Electronic Signature(s) ?Signed: 06/12/2021 3:52:15 PM By: Donnamarie Poag ?Entered ByDonnamarie Poag on 06/12/2021 11:45:26 ?[name], [name] ([id number]) ?-------------------------------------------------------------------------------- ?Multi Wound Chart Details ?Patient Name: [name], [name] ?Date of Service: 06/12/2021 11:30 AM ?Medical Record Number: [id number] ?Patient Account Number: [id number] ?Date of Birth/Sex: [birth year] (81 y.o. 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

## 2021-06-12 NOTE — Progress Notes (Addendum)
SABURO, LUGER ([id number]) ?Visit Report for 06/12/2021 ?Chief Complaint Document Details ?Patient Name: LESTON, SCHUELLER ?Date of Service: 06/12/2021 11:30 AM ?Medical Record Number: [id number] ?Patient Account Number: [id number] ?Date of Birth/Sex: Aug 20, 1940 (81 y.o. M) ?Treating RN: Donnamarie Poag ?Primary Care Provider: Elsie Stain Other Clinician: ?Referring Provider: Elsie Stain ?Treating Provider/Extender: Jeri Cos ?Weeks in Treatment: 15 ?Information Obtained from: Patient ?Chief Complaint ?Left LE Ulcer ?Electronic Signature(s) ?Signed: 06/12/2021 11:43:42 AM By: Worthy Keeler PA-C ?Entered By: Worthy Keeler on 06/12/2021 11:43:42 ?DASH, CARDARELLI ([id number]) ?-------------------------------------------------------------------------------- ?HPI Details ?Patient Name: OTT, ZIMMERLE ?Date of Service: 06/12/2021 11:30 AM ?Medical Record Number: [id number] ?Patient Account Number: [id number] ?Date of Birth/Sex: 02-28-1940 (81 y.o. M) ?Treating RN: Donnamarie Poag ?Primary Care Provider: Elsie Stain Other Clinician: ?Referring Provider: Elsie Stain ?Treating Provider/Extender: Jeri Cos ?Weeks in Treatment: 15 ?History of Present Illness ?HPI Description: 02/27/2021 upon evaluation today patient presents for initial evaluation here in the clinic sent concerning an issue on the left ?lateral heel which has been present currently for about 3 weeks that he notes. With that being said he did have an issue where he hit this area on ?a lawnmower over the summer 2022. Nonetheless the patient tells me that unfortunately though it did not seem to give him a lot of trouble ?immediately following this reopened more recently. He has not had any x-rays up to this time. 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 Subsequently he did have a fairly good ?arterial test today here in the clinic measured at 1.03 but I am somewhat cautious with that due to the fact that his extremity is very cold ?nonetheless he seems to have pretty good blood flow of the wound when I am cleaning this today. We will keep an eye on this and if I need to ?send him for formal arterial testing I will do so. ?03/13/2021 upon evaluation today patient appears to be doing well with regard to his wound. He has been tolerating the dressing changes without ?complication. Fortunately he does not show any signs of active infection at this time which is great news. No fevers, chills, nausea, vomiting, or ?diarrhea. With that being said his x-ray was negative for any signs of obvious osteomyelitis obviously an x-ray is not the best test for this but an ?MRI would be. Nonetheless I do not think that we need to jump straight to that at this point. ?03/27/2021 upon evaluation today patient actually appears to be doing extremely well in regard to his wounds. He has been tolerating the dressing ?changes on the left lateral ankle. I am actually extremely pleased with how this appears and I think that he is making excellent progress. ?Fortunately I do not see any evidence of active infection at this time which is great news. ?04/03/2021 upon evaluation today patient appears to be doing well with regard to his wound. Has been tolerating the dressing changes without ?complication. Fortunately there is no sign of active infection locally or systemically which is great news. No fevers, chills, nausea, vomiting, or ?diarrhea. ?04/08/2021 upon evaluation today patient appears to be doing poorly today in regard to his right heel which unfortunately has an open wound that ?was not present at the last evaluation. There  does not appear to be any signs of active infection locally or systemically at this time which is great ?news. No  fevers, chills, nausea, vomiting, or diarrhea. ?04/17/2021 upon evaluation patient appears to be doing well today in regard to his wound on both locations. I do feel like he is showing signs of ?improvement which is great news and overall I do feel like that were headed in the right direction reason silver collagen at both locations ?currently. ?04/24/2021 upon evaluation today patient appears to be doing well with regard to his wounds. We did get a denial for the skin itself unfortunately ?but the good news is he is actually showing some signs of improvement already so this is probably not even going to become necessary. ?05/01/2021 upon evaluation today patient's wounds are actually showing signs of excellent improvement. I am actually extremely pleased with ?where we stand today. I do not see any evidence of active infection locally nor systemically which is great news. No fevers, chills, nausea, ?vomiting, or diarrhea. ?05-08-2021 upon evaluation today patient appears to be doing well with regard to his wounds. Both are showing signs of improvement which is ?great news. Overall I am extremely pleased with where we stand today. There does not appear to be any signs of active infection locally nor ?systemically at this time ?05-22-2021 upon evaluation today patient appears to be doing well currently in regard to his wounds. They are actually showing signs of significant ?improvement which is great news both on the heel as well as on the ankle. I am happy with where things stand unfortunately he was not ?approved for the skin substitute but nonetheless I think that were still continuing to make progress here which is exactly what we are looking for. ?My hope is he will continue to show signs of improvement week by week till closure. ?05-23-2021 upon evaluation today patient appears to be doing well with regard to his wound. He has been tolerating the dressing changes without ?complication. Fortunately I do not see any  evidence of active infection at this time. No fevers, chills, nausea, vomiting, or diarrhea. ?4/23; right posterior heel and left lateral malleolus. Using collagen. The left is being wrapped 3 layer on the right changed by his son. Both ?wounds are doing very well ?06-05-2021 upon evaluation today patient appears to be doing well with regard to his wounds. Both are showing signs of improvement this is great ?news. Fortunately I do not see any evidence of active infection locally nor systemically which is great news. No fevers, chills, nausea, vomiting, or ?diarrhea. ?06-12-2021 upon evaluation today patient's wounds are showing signs of excellent improvement. I am actually extremely pleased with where we ?stand and I think that he is headed in the right direction here. Fortunately I do not see any evidence of active infection locally or systemically ?which is great news. ?Electronic Signature(s) ?Signed: 06/13/2021 7:36:37 AM By: Danton Sewer ([id number]) ?Entered By: Worthy Keeler on 06/13/2021 07:36:37 ?AVANTAE, BITHER ([id number]) ?-------------------------------------------------------------------------------- ?Physical Exam Details ?Patient Name: DYLLAN, HUGHETT ?Date of Service: 06/12/2021 11:30 AM ?Medical Record Number: [id number] ?Patient Account Number: [id number] ?Date of Birth/Sex: April 12, 1940 (81 y.o. M) ?Treating RN: Donnamarie Poag ?Primary Care Provider: Elsie Stain Other Clinician: ?Referring Provider: Elsie Stain ?Treating Provider/Extender: Jeri Cos ?Weeks in Treatment: 15 ?Constitutional ?Well-nourished and well-hydrated in no acute distress. ?Respiratory ?normal breathing without difficulty. ?Psychiatric ?this patient is able to make decisions and demonstrates good insight into disease process. Alert  and Oriented x 3. pleasant and cooperative. ?Notes ?Upon inspection patient's wound bed showed evidence of good granulation and epithelization at this point. Fortunately I  do not see any evidence ?of infection which is great news and overall I think he is headed in the right direction we have been using silver collagen and I think that is doing a ?great job for him. ?Electron

## 2021-06-19 ENCOUNTER — Encounter: Payer: Medicare PPO | Admitting: Physician Assistant

## 2021-06-26 ENCOUNTER — Encounter: Payer: Medicare PPO | Admitting: Physician Assistant

## 2021-06-26 DIAGNOSIS — L97412 Non-pressure chronic ulcer of right heel and midfoot with fat layer exposed: Secondary | ICD-10-CM | POA: Diagnosis not present

## 2021-06-26 DIAGNOSIS — G9009 Other idiopathic peripheral autonomic neuropathy: Secondary | ICD-10-CM | POA: Diagnosis not present

## 2021-06-26 DIAGNOSIS — Z856 Personal history of leukemia: Secondary | ICD-10-CM | POA: Diagnosis not present

## 2021-06-26 DIAGNOSIS — L97422 Non-pressure chronic ulcer of left heel and midfoot with fat layer exposed: Secondary | ICD-10-CM | POA: Diagnosis not present

## 2021-06-26 NOTE — Progress Notes (Addendum)
Michael, Morgan ([id number]) Visit Report for 06/26/2021 Chief Complaint Document Details Patient Name: [name], [name]. Date of Service: 06/26/2021 11:30 AM Medical Record Number: [id number] Patient Account Number: [id number] Date of Birth/Sex: [birth year] (81 y.o. M) Treating RN: Levora Dredge Primary Care Provider: Elsie Stain Other Clinician: Referring Provider: Elsie Stain Treating Provider/Extender: Skipper Cliche in Treatment: 17 Information Obtained from: Patient Chief Complaint Left LE Ulcer Electronic Signature(s) Signed: 06/26/2021 11:46:30 AM By: Worthy Keeler PA-C Entered By: Worthy Keeler on 06/26/2021 11:46:29 ABHIMANYU, CRUCES ([id number]) -------------------------------------------------------------------------------- Debridement Details Patient Name: [name]. Date of Service: 06/26/2021 11:30 AM Medical Record Number: [id number] Patient Account Number: [id number] Date of Birth/Sex: [birth year] (81 y.o. M) Treating RN: Levora Dredge Primary Care Provider: Elsie Stain Other Clinician: Referring Provider: Elsie Stain Treating Provider/Extender: Skipper Cliche in Treatment: 17 Debridement Performed for Wound #2 Right Calcaneus Assessment: Performed By: Physician Tommie Sams., PA-C Debridement Type: Debridement Level of Consciousness (Pre- Awake and Alert procedure): Pre-procedure Verification/Time Out Yes - 12:11 Taken: Total Area Debrided (L x W): 0.3 (cm) x 0.5 (cm) = 0.15 (cm) Tissue and other material Viable, Callus, Slough, Subcutaneous, Slough debrided: Level: Skin/Subcutaneous Tissue Debridement Description: Excisional Instrument: Curette Bleeding: Minimum Hemostasis Achieved: Pressure Response to Treatment: Procedure was tolerated well Level of Consciousness (Post- Awake and Alert procedure): Post Debridement Measurements of Total Wound Length: (cm) 0.3 Width: (cm) 0.5 Depth: (cm) 0.1 Volume: (cm)  0.012 Character of Wound/Ulcer Post Debridement: Stable Post Procedure Diagnosis Same as Pre-procedure Electronic Signature(s) Signed: 06/26/2021 4:20:08 PM By: Levora Dredge Signed: 06/27/2021 4:54:58 PM By: Worthy Keeler PA-C Entered By: Levora Dredge on 06/26/2021 12:13:23 [name], [name] ([id number]) -------------------------------------------------------------------------------- HPI Details Patient Name: [name]. Date of Service: 06/26/2021 11:30 AM Medical Record Number: [id number] Patient Account Number: [id number] Date of Birth/Sex: January 02, 1941 (81 y.o. M) Treating RN: Levora Dredge Primary Care Provider: Elsie Stain Other Clinician: Referring Provider: Elsie Stain Treating Provider/Extender: Skipper Cliche in Treatment: 17 History of Present Illness HPI Description: 02/27/2021 upon evaluation today patient presents for initial evaluation here in the clinic sent concerning an issue on the left lateral heel which has been present currently for about 3 weeks that he notes. With that being said he did have an issue where he hit this area on a lawnmower over the summer 2022. Nonetheless the patient tells me that unfortunately though it did not seem to give him a lot of trouble immediately following this reopened more recently. He has not had any x-rays up to this time. 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 Subsequently he did have a fairly good arterial test today here in the clinic measured at 1.03 but I am somewhat cautious with that due to the fact that his extremity is very cold nonetheless he seems to have pretty good blood flow of the wound when I am cleaning this today. We will keep an eye on this and if I  need to send him for formal arterial testing I will do so. 03/13/2021 upon evaluation today patient appears to be doing well with regard to his wound. He has been tolerating the dressing changes without complication. Fortunately he does not show any signs of active infection at this time  which is great news. No fevers, chills, nausea, vomiting, or diarrhea. With that being said his x-ray was negative for any signs of obvious osteomyelitis obviously an x-ray is not the best test for this but an MRI would be. Nonetheless I do not think that we need to jump straight to that at this point. 03/27/2021 upon evaluation today patient actually appears to be doing extremely well in regard to his wounds. He has been tolerating the dressing changes on the left lateral ankle. I am actually extremely pleased with how this appears and I think that he is making excellent progress. Fortunately I do not see any evidence of active infection at this time which is great news. 04/03/2021 upon evaluation today patient appears to be doing well with regard to his wound. Has been tolerating the dressing changes without complication. Fortunately there is no sign of active infection locally or systemically which is great news. No fevers, chills, nausea, vomiting, or diarrhea. 04/08/2021 upon evaluation today patient appears to be doing poorly today in regard to his right heel which unfortunately has an open wound that was not present at the last evaluation. There does not appear to be any signs of active infection locally or systemically at this time which is great news. No fevers, chills, nausea, vomiting, or diarrhea. 04/17/2021 upon evaluation patient appears to be doing well today in regard to his wound on both locations. I do feel like he is showing signs of improvement which is great news and overall I do feel like that were headed in the right direction reason silver collagen at both locations currently. 04/24/2021 upon  evaluation today patient appears to be doing well with regard to his wounds. We did get a denial for the skin itself unfortunately but the good news is he is actually showing some signs of improvement already so this is probably not even going to become necessary. 05/01/2021 upon evaluation today patient's wounds are actually showing signs of excellent improvement. I am actually extremely pleased with where we stand today. I do not see any evidence of active infection locally nor systemically which is great news. No fevers, chills, nausea, vomiting, or diarrhea. 05-08-2021 upon evaluation today patient appears to be doing well with regard to his wounds. Both are showing signs of improvement which is great news. Overall I am extremely pleased with where we stand today. There does not appear to be any signs of active infection locally nor systemically at this time 05-22-2021 upon evaluation today patient appears to be doing well currently in regard to his wounds. They are actually showing signs of significant improvement which is great news both on the heel as well as on the ankle. I am happy with where things stand unfortunately he was not approved for the skin substitute but nonetheless I think that were still continuing to make progress here which is exactly what we are looking for. My hope is he will continue to show signs of improvement week by week till closure. 05-23-2021 upon evaluation today patient appears to be doing well with regard to his wound. He has been tolerating the dressing changes without complication. Fortunately I do not see any evidence of active infection at this time. No fevers, chills, nausea, vomiting, or diarrhea. 4/23; right posterior heel and left lateral malleolus. Using collagen. The left is being wrapped 3 layer on the right changed by his son. Both wounds are doing very well 06-05-2021 upon evaluation today patient appears to be doing well with regard  to his wounds. Both are  showing signs of improvement this is great news. Fortunately I do not see any evidence of active infection locally nor systemically which is great news. No fevers, chills, nausea, vomiting, or diarrhea. 06-12-2021 upon evaluation today patient's wounds are showing signs of excellent improvement. I am actually extremely pleased with where we stand and I think that he is headed in the right direction here. Fortunately I do not see any evidence of active infection locally or systemically which is great news. 06-26-2021 upon evaluation today patient actually appears to be doing excellent in fact his left lateral ankle appears to be completely healed which is also news. There does not appear to be any evidence of active infection locally or systemically which is great news and overall very pleased with where things stand today. DURREL, MCNEE ([id number]) Electronic Signature(s) Signed: 06/26/2021 5:26:40 PM By: Worthy Keeler PA-C Entered By: Worthy Keeler on 06/26/2021 17:26:39 HAMZA, EMPSON ([id number]) -------------------------------------------------------------------------------- Physical Exam Details Patient Name: KUSH, FARABEE. Date of Service: 06/26/2021 11:30 AM Medical Record Number: [id number] Patient Account Number: [id number] Date of Birth/Sex: 05/19/1940 (81 y.o. M) Treating RN: Levora Dredge Primary Care Provider: Elsie Stain Other Clinician: Referring Provider: Elsie Stain Treating Provider/Extender: Skipper Cliche in Treatment: 74 Constitutional Well-nourished and well-hydrated in no acute distress. Respiratory normal breathing without difficulty. Psychiatric this patient is able to make decisions and demonstrates good insight into disease process. Alert and Oriented x 3. pleasant and cooperative. Notes Patient's wound bed showed evidence of good granulation and epithelization in fact the left lateral ankle is completely healed the right heel did require  sharp debridement today and I did clearway a lot of slough and necrotic debris tolerated that today without complication postdebridement the wound bed appears to be doing much better. He has also been taken off of methotrexate or at least the dose lowered in order to try to help with healing as well. Electronic Signature(s) Signed: 06/26/2021 5:27:03 PM By: Worthy Keeler PA-C Entered By: Worthy Keeler on 06/26/2021 17:27:03 XAN, INGRAHAM ([id number]) -------------------------------------------------------------------------------- Physician Orders Details Patient Name: URBAN, NAVAL. Date of Service: 06/26/2021 11:30 AM Medical Record Number: [id number] Patient Account Number: [id number] Date of Birth/Sex: [birth year] (81 y.o. 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 o Nurse Visit as needed Anesthetic (Use 'Patient Medications' Section for Anesthetic Order Entry) o Lidocaine applied to wound bed Edema Control - Lymphedema / Segmental Compressive Device / Other o Elevate, Exercise Daily and Avoid Standing for Long Periods of Time. o Elevate legs to the level of the heart and pump ankles as often as possible o Elevate leg(s) parallel to the floor when sitting. o DO YOUR BEST to sleep in the bed at night. DO NOT sleep in your recliner. Long hours of sitting in a recliner leads to swelling of the legs and/or potential wounds on your  backside. Off-Loading o Turn and reposition every 2 hours - keep pressure off of wounds/float heels in chair or bed Additional Orders / Instructions o Follow Nutritious Diet and  Increase Protein Intake Wound Treatment Wound #2 - Calcaneus Wound Laterality: Right Cleanser: Soap and Water 3 x Per Week/15 Days Discharge Instructions: Gently cleanse wound with antibacterial soap, rinse and pat dry prior to dressing wounds Primary Dressing: Prisma 4.34 (in) (Generic) 3 x Per Week/15 Days Discharge Instructions: Moisten w/normal saline or sterile water; Cover wound as directed. Do not remove from wound bed. Secondary Dressing: (SILICONE BORDER) Zetuvit Plus SILICONE BORDER Dressing 4x4 (in/in) (DME) (Generic) 3 x Per Week/15 Days Discharge Instructions: Please do not put silicone bordered dressings under wraps. Use non-bordered dressing only. Secured With: Tubigrip Size E, 3.5x10 (in/yds) 3 x Per Week/15 Days Discharge Instructions: Apply 3 Tubigrip E 3-finger-widths below knee to base of toes to secure dressing and/or for swelling. Electronic Signature(s) Signed: 06/26/2021 4:20:08 PM By: Levora Dredge Signed: 06/27/2021 4:54:58 PM By: Worthy Keeler PA-C Entered By: Levora Dredge on 06/26/2021 12:25:47 SAAMIR, ARMSTRONG ([id number]) -------------------------------------------------------------------------------- Problem List Details Patient Name: HADY, NIEMCZYK. Date of Service: 06/26/2021 11:30 AM Medical Record Number: [id number] Patient Account Number: [id number] Date of Birth/Sex: [birth year] (81 y.o. 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 Date of Service: 06/26/2021 11:30 AM Medical Record Number: [id number] Patient Account Number: [id number] Date of Birth/Sex: Feb 08, 1940 (81 y.o. M) Treating RN: Levora Dredge Primary Care Provider: Elsie Stain Other Clinician: Referring Provider: Elsie Stain Treating Provider/Extender: Skipper Cliche in Treatment: 17 Subjective Chief Complaint Information obtained from Patient Left LE Ulcer History of Present Illness (HPI) 02/27/2021 upon evaluation today patient presents for initial evaluation here in the clinic sent concerning an issue on the left lateral heel which has been present currently for about 3 weeks that he notes. With that being said he did have an issue where he hit this area on a lawnmower over the summer 2022. Nonetheless the patient tells me that unfortunately though it did not seem to give him a lot of trouble immediately following this reopened more recently. He has not had any x-rays up to this time. 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 Subsequently he did have a fairly good arterial test today here in the clinic measured at 1.03 but I am somewhat cautious with that due to the  fact that his extremity is very cold nonetheless he seems to have pretty good blood flow of the wound when I am cleaning this today. We will keep an eye on this and if I need to send him for formal arterial testing I will do so. 03/13/2021 upon evaluation today patient appears to be doing well with regard to his wound. He has been  tolerating the dressing changes without complication. Fortunately he does not show any signs of active infection at this time which is great news. No fevers, chills, nausea, vomiting, or diarrhea. With that being said his x-ray was negative for any signs of obvious osteomyelitis obviously an x-ray is not the best test for this but an MRI would be. Nonetheless I do not think that we need to jump straight to that at this point. 03/27/2021 upon evaluation today patient actually appears to be doing extremely well in regard to his wounds. He has been tolerating the dressing changes on the left lateral ankle. I am actually extremely pleased with how this appears and I think that he is making excellent progress. Fortunately I do not see any evidence of active infection at this time which is great news. 04/03/2021 upon evaluation today patient appears to be doing well with regard to his wound. Has been tolerating the dressing changes without complication. Fortunately there is no sign of active infection locally or systemically which is great news. No fevers, chills, nausea, vomiting, or diarrhea. 04/08/2021 upon evaluation today patient appears to be doing poorly today in regard to his right heel which unfortunately has an open wound that was not present at the last evaluation. There does not appear to be any signs of active infection locally or systemically at this time which is great news. No fevers, chills, nausea, vomiting, or diarrhea. 04/17/2021 upon evaluation patient appears to be doing well today in regard to his wound on both locations. I do feel like he is showing signs  of improvement which is great news and overall I do feel like that were headed in the right direction reason silver collagen at both locations currently. 04/24/2021 upon evaluation today patient appears to be doing well with regard to his wounds. We did get a denial for the skin itself unfortunately but the good news is he is actually showing some signs of improvement already so this is probably not even going to become necessary. 05/01/2021 upon evaluation today patient's wounds are actually showing signs of excellent improvement. I am actually extremely pleased with where we stand today. I do not see any evidence of active infection locally nor systemically which is great news. No fevers, chills, nausea, vomiting, or diarrhea. 05-08-2021 upon evaluation today patient appears to be doing well with regard to his wounds. Both are showing signs of improvement which is great news. Overall I am extremely pleased with where we stand today. There does not appear to be any signs of active infection locally nor systemically at this time 05-22-2021 upon evaluation today patient appears to be doing well currently in regard to his wounds. They are actually showing signs of significant improvement which is great news both on the heel as well as on the ankle. I am happy with where things stand unfortunately he was not approved for the skin substitute but nonetheless I think that were still continuing to make progress here which is exactly what we are looking for. My hope is he will continue to show signs of improvement week by week till closure. 05-23-2021 upon evaluation today patient appears to be doing well with regard to his wound. He has been tolerating the dressing changes without complication. Fortunately I do not see any evidence of active infection at this time. No fevers, chills, nausea, vomiting, or diarrhea. 4/23; right posterior heel and left lateral malleolus. Using collagen. The left is being wrapped 3  layer on the right changed by  his son. Both wounds are doing very well 06-05-2021 upon evaluation today patient appears to be doing well with regard to his wounds. Both are showing signs of improvement this is great news. Fortunately I do not see any evidence of active infection locally nor systemically which is great news. No fevers, chills, nausea, vomiting, or diarrhea. 06-12-2021 upon evaluation today patient's wounds are showing signs of excellent improvement. I am actually extremely pleased with where we stand and I think that he is headed in the right direction here. Fortunately I do not see any evidence of active infection locally or systemically which is great news. JAYSEN, WEY ([id number]) 06-26-2021 upon evaluation today patient actually appears to be doing excellent in fact his left lateral ankle appears to be completely healed which is also news. There does not appear to be any evidence of active infection locally or systemically which is great news and overall very pleased with where things stand today. Objective Constitutional Well-nourished and well-hydrated in no acute distress. Vitals Time Taken: 11:40 AM, Height: 76 in, Weight: 200 lbs, BMI: 24.3, Temperature: 97.6 F, Pulse: 73 bpm, Respiratory Rate: 18 breaths/min, Blood Pressure: 112/62 mmHg. Respiratory normal breathing without difficulty. Psychiatric this patient is able to make decisions and demonstrates good insight into disease process. Alert and Oriented x 3. pleasant and cooperative. General Notes: Patient's wound bed showed evidence of good granulation and epithelization in fact the left lateral ankle is completely healed the right heel did require sharp debridement today and I did clearway a lot of slough and necrotic debris tolerated that today without complication postdebridement the wound bed appears to be doing much better. He has also been taken off of methotrexate or at least the dose lowered in order to  try to help with healing as well. Integumentary (Hair, Skin) Wound #1 status is Healed - Epithelialized. Original cause of wound was Trauma. The date acquired was: 02/07/2021. The wound has been in treatment 17 weeks. The wound is located on the Left,Lateral Ankle. The wound measures 0cm length x 0cm width x 0cm depth; 0cm^2 area and 0cm^3 volume. There is no tunneling or undermining noted. There is a medium amount of serosanguineous drainage noted. There is no granulation within the wound bed. There is no necrotic tissue within the wound bed. Wound #2 status is Open. Original cause of wound was Gradually Appeared. The date acquired was: 04/05/2021. The wound has been in treatment 11 weeks. The wound is located on the Right Calcaneus. The wound measures 0.3cm length x 0.5cm width x 0.1cm depth; 0.118cm^2 area and 0.012cm^3 volume. There is Fat Layer (Subcutaneous Tissue) exposed. There is no tunneling or undermining noted. There is a medium amount of serous drainage noted. There is large (67-100%) red, pink granulation within the wound bed. There is a small (1-33%) amount of necrotic tissue within the wound bed including Adherent Slough. 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 There was a Excisional Skin/Subcutaneous Tissue Debridement with a total area of 0.15 sq cm performed by Tommie Sams., PA-C. With the following instrument(s): Curette to remove Viable tissue/material. Material removed includes Callus, Subcutaneous Tissue, and Slough. No specimens were taken. A time out was conducted at 12:11, prior to  the start of the procedure. A Minimum amount of  bleeding was controlled with Pressure. The procedure was tolerated well. Post Debridement Measurements: 0.3cm length x 0.5cm width x 0.1cm depth; 0.012cm^3 volume. Character of Wound/Ulcer Post Debridement is stable. Post procedure Diagnosis Wound #2: Same as Pre-Procedure DAYLAN, JUHNKE ([id number]) Plan Follow-up Appointments: Return Appointment in 2 weeks. Nurse Visit as needed Anesthetic (Use 'Patient Medications' Section for Anesthetic Order Entry): Lidocaine applied to wound bed Edema Control - Lymphedema / Segmental Compressive Device / Other: Elevate, Exercise Daily and Avoid Standing for Long Periods of Time. Elevate legs to the level of the heart and pump ankles as often as possible Elevate leg(s) parallel to the floor when sitting. DO YOUR BEST to sleep in the bed at night. DO NOT sleep in your recliner. Long hours of sitting in a recliner leads to swelling of the legs and/or potential wounds on your backside. Off-Loading: Turn and reposition every 2 hours - keep pressure off of wounds/float heels in chair or bed Additional Orders / Instructions: Follow Nutritious Diet and Increase Protein Intake WOUND #2: - Calcaneus Wound Laterality: Right Cleanser: Soap and Water 3 x Per Week/15 Days Discharge Instructions: Gently cleanse wound with antibacterial soap, rinse and pat dry prior to dressing wounds Primary Dressing: Prisma 4.34 (in) (Generic) 3 x Per Week/15 Days Discharge Instructions: Moisten w/normal saline or sterile water; Cover wound as directed. Do not remove from wound bed. Secondary Dressing: (SILICONE BORDER) Zetuvit Plus SILICONE BORDER Dressing 4x4 (in/in) (DME) (Generic) 3 x Per Week/15 Days Discharge Instructions: Please do not put silicone bordered dressings under wraps. Use non-bordered dressing only. Secured With: Tubigrip Size E, 3.5x10 (in/yds) 3 x Per Week/15 Days Discharge Instructions: Apply 3 Tubigrip E 3-finger-widths below knee to base of toes to  secure dressing and/or for swelling. 1. I would recommend currently that we go and continue with the wound care measures as before and the patient is in agreement the plan. This includes the use of the silver collagen followed by the bordered foam dressing on the right heel I think this is perfect. 2. We can just use a Telfa island dressing on the left heel in order to protect the area there is no need for the bordered foam at this point and overall I think it is doing quite well. 3. We will continue with the Tubigrip which I think is doing quite well. We will see patient back for reevaluation in 1 week here in the clinic. If anything worsens or changes patient will contact our office for additional recommendations. Electronic Signature(s) Signed: 06/26/2021 5:27:44 PM By: Worthy Keeler PA-C Entered By: Worthy Keeler on 06/26/2021 17:27:44 YADIR, ZENTNER ([id number]) -------------------------------------------------------------------------------- SuperBill Details Patient Name: ZAKIAH, BECKERMAN. Date of Service: 06/26/2021 Medical Record Number: [id number] Patient Account Number: [id number] Date of Birth/Sex: 04/22/1940 (81 y.o. 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 with fat layer exposed Z85.6 Personal history of leukemia Facility Procedures CPT4 Code: 67893810 Description: 17510 - DEB SUBQ TISSUE 20 SQ CM/< Modifier: Quantity: 1 CPT4 Code: Description: ICD-10 Diagnosis Description 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

## 2021-06-26 NOTE — Progress Notes (Signed)
TAESHAWN, HELFMAN ([id number]) Visit Report for 06/26/2021 Arrival Information Details Patient Name: [name], [name]. Date of Service: 06/26/2021 11:30 AM Medical Record Number: [id number] Patient Account Number: [id number] Date of Birth/Sex: [birth year] (81 y.o. M) Treating RN: Levora Dredge Primary Care Osbaldo Mark: Elsie Stain Other Clinician: Referring Konnor Jorden: Elsie Stain Treating Yonah Tangeman/Extender: Skipper Cliche in Treatment: 39 Visit Information History Since Last Visit Added or deleted any medications: No Patient Arrived: Wheel Chair Any new allergies or adverse reactions: No Arrival Time: 11:33 Had a fall or experienced change in Yes Accompanied By: daughter activities of daily living that may affect Transfer Assistance: EasyPivot Patient Lift risk of falls: Patient Requires Transmission-Based No Hospitalized since last visit: No Precautions: Has Dressing in Place as Prescribed: Yes Patient Has Alerts: Yes Pain Present Now: No Patient Alerts: Patient on Blood Thinner '81MG'$  x 2 NOT DIABETIC Notes pt states recent fall last Tuesday, no injuries from fall Electronic Signature(s) Signed: 06/26/2021 4:20:08 PM By: Levora Dredge Entered By: Levora Dredge on 06/26/2021 11:42:25 [name] ([id number]) -------------------------------------------------------------------------------- Clinic Level of Care Assessment Details Patient Name: KEIMON, BASALDUA. Date of Service: 06/26/2021 11:30 AM Medical Record Number: [id number] Patient Account Number: [id number] Date of Birth/Sex: 1940/03/11 (81 y.o. M) Treating RN: Levora Dredge Primary Care Akshara Blumenthal: Elsie Stain Other Clinician: Referring Houston Zapien: Elsie Stain Treating Julane Crock/Extender: Skipper Cliche in Treatment: 17 Clinic Level of Care Assessment Items TOOL 1 Quantity Score '[]'$  - Use when EandM and Procedure is performed on INITIAL visit 0 ASSESSMENTS - Nursing Assessment / Reassessment '[]'$  -  General Physical Exam (combine w/ comprehensive assessment (listed just below) when performed on new 0 pt. evals) '[]'$  - 0 Comprehensive Assessment (HX, ROS, Risk Assessments, Wounds Hx, etc.) ASSESSMENTS - Wound and Skin Assessment / Reassessment '[]'$  - Dermatologic / Skin Assessment (not related to wound area) 0 ASSESSMENTS - Ostomy and/or Continence Assessment and Care '[]'$  - Incontinence Assessment and Management 0 '[]'$  - 0 Ostomy Care Assessment and Management (repouching, etc.) PROCESS - Coordination of Care '[]'$  - Simple Patient / Family Education for ongoing care 0 '[]'$  - 0 Complex (extensive) Patient / Family Education for ongoing care '[]'$  - 0 Staff obtains Programmer, systems, Records, Test Results / Process Orders '[]'$  - 0 Staff telephones HHA, Nursing Homes / Clarify orders / etc '[]'$  - 0 Routine Transfer to another Facility (non-emergent condition) '[]'$  - 0 Routine Hospital Admission (non-emergent condition) '[]'$  - 0 New Admissions / Biomedical engineer / Ordering NPWT, Apligraf, etc. '[]'$  - 0 Emergency Hospital Admission (emergent condition) PROCESS - Special Needs '[]'$  - Pediatric / Minor Patient Management 0 '[]'$  - 0 Isolation Patient Management '[]'$  - 0 Hearing / Language / Visual special needs '[]'$  - 0 Assessment of Community assistance (transportation, D/C planning, etc.) '[]'$  - 0 Additional assistance / Altered mentation '[]'$  - 0 Support Surface(s) Assessment (bed, cushion, seat, etc.) INTERVENTIONS - Miscellaneous '[]'$  - External ear exam 0 '[]'$  - 0 Patient Transfer (multiple staff / Civil Service fast streamer / Similar devices) '[]'$  - 0 Simple Staple / Suture removal (25 or less) '[]'$  - 0 Complex Staple / Suture removal (26 or more) '[]'$  - 0 Hypo/Hyperglycemic Management (do not check if billed separately) '[]'$  - 0 Ankle / Brachial Index (ABI) - do not check if billed separately Has the patient been seen at the hospital within the last three years: Yes Total Score: 0 Level Of Care: ____ [name]  ([id number]) Electronic Signature(s) Signed: 06/26/2021 4:20:08 PM By: Levora Dredge Entered By: Levora Dredge on  06/26/2021 12:25:53 ARVO, EALY ([id number]) -------------------------------------------------------------------------------- Encounter Discharge Information Details Patient Name: KOLTEN, RYBACK. Date of Service: 06/26/2021 11:30 AM Medical Record Number: [id number] Patient Account Number: [id number] Date of Birth/Sex: 10/23/1940 (81 y.o. M) Treating RN: Levora Dredge Primary Care Katrell Milhorn: Elsie Stain Other Clinician: Referring Darcey Demma: Elsie Stain Treating Asencion Guisinger/Extender: Skipper Cliche in Treatment: 17 Encounter Discharge Information Items Post Procedure Vitals Discharge Condition: Stable Temperature (F): 97.6 Ambulatory Status: Wheelchair Pulse (bpm): 73 Discharge Destination: Home Respiratory Rate (breaths/min): 18 Transportation: Private Auto Blood Pressure (mmHg): 112/62 Accompanied By: daughter Schedule Follow-up Appointment: Yes Clinical Summary of Care: Electronic Signature(s) Signed: 06/26/2021 4:20:08 PM By: Levora Dredge Entered By: Levora Dredge on 06/26/2021 12:27:40 IZAK, ANDING ([id number]) -------------------------------------------------------------------------------- Lower Extremity Assessment Details Patient Name: AVEDIS, BEVIS. Date of Service: 06/26/2021 11:30 AM Medical Record Number: [id number] Patient Account Number: [id number] Date of Birth/Sex: Jan 27, 1941 (81 y.o. M) Treating RN: Levora Dredge Primary Care Orla Jolliff: Elsie Stain Other Clinician: Referring Sadhana Frater: Elsie Stain Treating Shikira Folino/Extender: Jeri Cos Weeks in Treatment: 17 Edema Assessment Assessed: [Left: No] [Right: No] Edema: [Left: Yes] [Right: Yes] Vascular Assessment Pulses: Dorsalis Pedis Palpable: [Left:Yes] [Right:Yes] Electronic Signature(s) Signed: 06/26/2021 4:20:08 PM By: Levora Dredge Entered By: Levora Dredge on 06/26/2021 11:55:07 [name] ([id number]) -------------------------------------------------------------------------------- Multi Wound Chart Details Patient Name: [name]. Date of Service: 06/26/2021 11:30 AM Medical Record Number: [id number] Patient Account Number: [id number] Date of Birth/Sex: April 08, 1940 (81 y.o. 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 N/A Comorbid History: Cataracts, Neuropathy Cataracts, Neuropathy N/A Date Acquired: 02/07/2021 04/05/2021 N/A Weeks of Treatment: 17 11 N/A Wound Status: Open Open N/A Wound Recurrence: No No N/A Measurements L x W x D (cm) 0.2x0.2x0.1 0.3x0.5x0.1 N/A Area (cm) : 0.031 0.118 N/A Volume (cm) : 0.003 0.012 N/A % Reduction in Area: 98.20% 66.60% N/A % Reduction in Volume: 99.60% 65.70% N/A Classification: Full Thickness With Exposed Full Thickness Without Exposed N/A Support Structures Support Structures Exudate Amount: Medium Medium N/A Exudate Type: Serosanguineous Serous N/A Exudate Color: red, brown amber N/A Granulation Amount: Large (67-100%) Large (67-100%) N/A Granulation Quality: Pink, Pale Red, Pink N/A Necrotic Amount: Small (1-33%) Small (1-33%) N/A Exposed Structures: Fat Layer (Subcutaneous Tissue): Fat Layer (Subcutaneous Tissue): N/A Yes Yes Fascia: No Fascia: No Tendon: No Tendon: No Muscle: No Muscle: No Joint: No Joint: No Bone: No Bone: No Epithelialization:  Small (1-33%) Small (1-33%) N/A Treatment Notes Electronic Signature(s) Signed: 06/26/2021 4:20:08 PM By: Levora Dredge Entered By: Levora Dredge on 06/26/2021 12:06:47 [name] ([id number]) -------------------------------------------------------------------------------- [place] Details Patient Name: KANYON, SEIBOLD. Date of Service: 06/26/2021 11:30 AM Medical Record Number: [id number] Patient Account Number: [id number] Date of Birth/Sex: 10/18/1940 (81 y.o. M) Treating RN: Levora Dredge Primary Care Finnley Lewis: Elsie Stain Other Clinician: Referring Kalani Baray: Elsie Stain Treating Lilybelle Mayeda/Extender: Skipper Cliche in Treatment: 17 Active Inactive Electronic Signature(s) Signed: 06/26/2021 4:20:08 PM By: Levora Dredge Entered By: Levora Dredge on 06/26/2021 12:06:25 CHAEL, URENDA ([id number]) -------------------------------------------------------------------------------- Pain Assessment Details Patient Name: FISHEL, WAMBLE. Date of Service: 06/26/2021 11:30 AM Medical Record Number: [id number] Patient Account Number: [id number] Date of Birth/Sex: 1940/02/21 (81 y.o.  M) Treating RN: Levora Dredge Primary Care Juhi Lagrange: Elsie Stain Other Clinician: Referring Davante Gerke: Elsie Stain Treating Emilian Stawicki/Extender: Skipper Cliche in Treatment: 17 Active Problems Location of Pain Severity and Description of Pain Patient Has Paino No Site Locations Rate the pain. Current Pain Level: 0 Pain Management and Medication Current Pain Management: Electronic Signature(s) Signed: 06/26/2021 4:20:08 PM By: Levora Dredge Entered By: Levora Dredge on 06/26/2021 11:41:53 [name] ([id number]) -------------------------------------------------------------------------------- Patient/Caregiver Education Details Patient Name: HOLSTON, OYAMA. Date of Service: 06/26/2021 11:30 AM Medical Record Number: [id number] Patient Account  Number: [id number] Date of Birth/Gender: December 04, 1940 (81 y.o. M) Treating RN: Levora Dredge Primary Care Physician: Elsie Stain Other Clinician: Referring Physician: Elsie Stain Treating Physician/Extender: Skipper Cliche in Treatment: 39 Education Assessment Education Provided To: Patient and Caregiver Education Topics Provided Wound Debridement: Handouts: Wound Debridement Methods: Explain/Verbal Responses: State content correctly Wound/Skin Impairment: Handouts: Caring for Your Ulcer Methods: Explain/Verbal Responses: State content correctly Electronic Signature(s) Signed: 06/26/2021 4:20:08 PM By: Levora Dredge Entered By: Levora Dredge on 06/26/2021 12:26:24 NAKSH, RADI ([id number]) -------------------------------------------------------------------------------- Wound Assessment Details Patient Name: AASHRITH, EVES. Date of Service: 06/26/2021 11:30 AM Medical Record Number: [id number] Patient Account Number: [id number] Date of Birth/Sex: 1940/07/16 (81 y.o. M) Treating RN: Levora Dredge Primary Care Isiac Breighner: Elsie Stain Other Clinician: Referring Linzi Ohlinger: Elsie Stain Treating Trey Bebee/Extender: Jeri Cos Weeks in Treatment: 17 Wound Status Wound Number: 1 Primary Etiology: Neuropathic Ulcer-Non Diabetic Wound Location: Left, Lateral Ankle Wound Status: Healed - Epithelialized Wounding Event: Trauma Comorbid History: Cataracts, Neuropathy Date Acquired: 02/07/2021 Weeks Of Treatment: 17 Clustered Wound: No Photos Wound Measurements Length: (cm) 0 Width: (cm) 0 Depth: (cm) 0 Area: (cm) 0 Volume: (cm) 0 % Reduction in Area: 100% % Reduction in Volume: 100% Epithelialization: Large (67-100%) Tunneling: No Undermining: No Wound Description Classification: Full Thickness With Exposed Support Structures Exudate Amount: Medium Exudate Type: Serosanguineous Exudate Color: red, brown Foul Odor After Cleansing: No Slough/Fibrino  Yes Wound Bed Granulation Amount: None Present (0%) Exposed Structure Necrotic Amount: None Present (0%) Fascia Exposed: No Fat Layer (Subcutaneous Tissue) Exposed: No Tendon Exposed: No Muscle Exposed: No Joint Exposed: No Bone Exposed: No Electronic Signature(s) Signed: 06/26/2021 4:20:08 PM By: Levora Dredge Entered By: Levora Dredge on 06/26/2021 12:11:04 [name] ([id number]) -------------------------------------------------------------------------------- Wound Assessment Details Patient Name: [name]. Date of Service: 06/26/2021 11:30 AM Medical Record Number: [id number] Patient Account Number: [id number] Date of Birth/Sex: 21-Aug-1940 (81 y.o. M) Treating RN: Levora Dredge Primary Care Yaniv Lage: Elsie Stain Other Clinician: Referring Delson Dulworth: Elsie Stain Treating Carynn Felling/Extender: Jeri Cos Weeks in Treatment: 17 Wound Status Wound Number: 2 Primary Etiology: Neuropathic Ulcer-Non Diabetic Wound Location: Right Calcaneus Wound Status: Open Wounding Event: Gradually Appeared Comorbid History: Cataracts, Neuropathy Date Acquired: 04/05/2021 Weeks Of Treatment: 11 Clustered Wound: No Photos Wound Measurements Length: (cm) 0.3 Width: (cm) 0.5 Depth: (cm) 0.1 Area: (cm) 0.118 Volume: (cm) 0.012 % Reduction in Area: 66.6% % Reduction in Volume: 65.7% Epithelialization: Small (1-33%) Tunneling: No Undermining: No Wound Description Classification: Full Thickness Without Exposed Support Structu Exudate Amount: Medium Exudate Type: Serous Exudate Color: amber res Foul Odor After Cleansing: No Slough/Fibrino Yes Wound Bed Granulation Amount: Large (67-100%) Exposed Structure Granulation Quality: Red, Pink Fascia Exposed: No Necrotic Amount: Small (1-33%) Fat Layer (Subcutaneous Tissue) Exposed: Yes Necrotic Quality: Adherent Slough Tendon Exposed: No Muscle Exposed: No Joint Exposed: No Bone Exposed: No Treatment Notes Wound #2  (Calcaneus) Wound Laterality: Right Cleanser Soap and Water Discharge Instruction: Gently cleanse wound with antibacterial  soap, rinse and pat dry prior to dressing wounds [place] ([id number]) Topical Primary Dressing Prisma 4.34 (in) Discharge Instruction: Moisten w/normal saline or sterile water; Cover wound as directed. Do not remove from wound bed. Secondary Dressing (SILICONE BORDER) Zetuvit Plus SILICONE BORDER Dressing 4x4 (in/in) Discharge Instruction: Please do not put silicone bordered dressings under wraps. Use non-bordered dressing only. Secured With Tubigrip Size E, 3.5x10 (in/yds) Discharge Instruction: Apply 3 Tubigrip E 3-finger-widths below knee to base of toes to secure dressing and/or for swelling. Compression Wrap Compression Stockings Add-Ons Electronic Signature(s) Signed: 06/26/2021 4:20:08 PM By: Levora Dredge Entered By: Levora Dredge on 06/26/2021 11:54:52 SHAYAN, BRAMHALL ([id number]) -------------------------------------------------------------------------------- Vitals Details Patient Name: GIOVONNI, POIRIER. Date of Service: 06/26/2021 11:30 AM Medical Record Number: [id number] Patient Account Number: [id number] Date of Birth/Sex: 1940-07-30 (81 y.o. M) Treating RN: Levora Dredge Primary Care Kenna Seward: Elsie Stain Other Clinician: Referring Mae Cianci: Elsie Stain Treating Mattie Novosel/Extender: Skipper Cliche in Treatment: 17 Vital Signs Time Taken: 11:40 Temperature (F): 97.6 Height (in): 76 Pulse (bpm): 73 Weight (lbs): 200 Respiratory Rate (breaths/min): 18 Body Mass Index (BMI): 24.3 Blood Pressure (mmHg): 112/62 Reference Range: 80 - 120 mg / dl Electronic Signature(s) Signed: 06/26/2021 4:20:08 PM By: Levora Dredge Entered By: Levora Dredge on 06/26/2021 11:41:42

## 2021-07-09 ENCOUNTER — Encounter: Payer: Medicare PPO | Attending: Physician Assistant | Admitting: Internal Medicine

## 2021-07-09 DIAGNOSIS — L97422 Non-pressure chronic ulcer of left heel and midfoot with fat layer exposed: Secondary | ICD-10-CM | POA: Diagnosis not present

## 2021-07-09 DIAGNOSIS — Z856 Personal history of leukemia: Secondary | ICD-10-CM | POA: Diagnosis not present

## 2021-07-09 DIAGNOSIS — G9009 Other idiopathic peripheral autonomic neuropathy: Secondary | ICD-10-CM | POA: Insufficient documentation

## 2021-07-09 DIAGNOSIS — L97412 Non-pressure chronic ulcer of right heel and midfoot with fat layer exposed: Secondary | ICD-10-CM | POA: Diagnosis not present

## 2021-07-09 NOTE — Progress Notes (Addendum)
TORAN, MURCH ([id number]) Visit Report for 07/09/2021 Chief Complaint Document Details Patient Name: [name], [name]. Date of Service: 07/09/2021 3:00 PM Medical Record Number: [id number] Patient Account Number: [id number] Date of Birth/Sex: 1940/03/26 (81 y.o. M) Treating RN: Primary Care Provider: Elsie Stain Other Clinician: Referring Provider: Elsie Stain Treating Provider/Extender: Skipper Cliche in Treatment: 18 Information Obtained from: Patient Chief Complaint Left LE Ulcer Electronic Signature(s) Signed: 07/09/2021 3:21:32 PM By: Worthy Keeler PA-C Entered By: Worthy Keeler on 07/09/2021 15:21:32 [name], [name] ([id number]) -------------------------------------------------------------------------------- HPI Details Patient Name: [name], [name]. Date of Service: 07/09/2021 3:00 PM Medical Record Number: [id number] Patient Account Number: [id number] Date of Birth/Sex: 1940-02-09 (81 y.o. M) Treating RN: Primary Care Provider: Elsie Stain Other Clinician: Referring Provider: Elsie Stain Treating Provider/Extender: Skipper Cliche in Treatment: 18 History of Present Illness HPI Description: 02/27/2021 upon evaluation today patient presents for initial evaluation here in the clinic sent concerning an issue on the left lateral heel which has been present currently for about 3 weeks that he notes. With that being said he did have an issue where he hit this area on a lawnmower over the summer 2022. Nonetheless the patient tells me that unfortunately though it did not seem to give him a lot of trouble immediately following this reopened more recently. He has not had any x-rays up to this time. 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 Subsequently he did have a fairly good arterial test today here in the clinic measured at 1.03 but I am somewhat cautious with that due to the fact that his extremity is very cold nonetheless he seems to have pretty good blood flow of the wound when I am cleaning this today. We will keep an eye on this and if I need to send him for formal arterial testing I will do so. 03/13/2021 upon evaluation today patient appears to be doing well with regard to his wound. He has been tolerating the dressing changes without complication. Fortunately he does not show any signs of active infection at this time which is great news. No fevers, chills, nausea, vomiting, or diarrhea. With that being said his x-ray was negative for any signs of obvious osteomyelitis obviously an x-ray is not the best test for this but an MRI would be. Nonetheless I do not think that we need to jump straight to that at this point. 03/27/2021 upon evaluation today patient actually appears to be doing extremely well in regard to his wounds. He has been tolerating the dressing changes on the left lateral ankle. I am actually extremely pleased with how this appears and I think that he is making excellent progress. Fortunately I do not see any evidence of active infection at this time which is great news. 04/03/2021 upon evaluation today patient appears to be doing well with regard to his wound. Has been tolerating the dressing changes without complication. Fortunately there is no sign of active infection locally or systemically which is great news. No fevers, chills, nausea, vomiting, or diarrhea. 04/08/2021 upon evaluation today patient appears to be doing poorly today in regard to his right heel which unfortunately has an open wound that was not present at the last evaluation. There does not appear to  be any signs of active infection locally or systemically at this time which is great news. No fevers, chills, nausea, vomiting,  or diarrhea. 04/17/2021 upon evaluation patient appears to be doing well today in regard to his wound on both locations. I do feel like he is showing signs of improvement which is great news and overall I do feel like that were headed in the right direction reason silver collagen at both locations currently. 04/24/2021 upon evaluation today patient appears to be doing well with regard to his wounds. We did get a denial for the skin itself unfortunately but the good news is he is actually showing some signs of improvement already so this is probably not even going to become necessary. 05/01/2021 upon evaluation today patient's wounds are actually showing signs of excellent improvement. I am actually extremely pleased with where we stand today. I do not see any evidence of active infection locally nor systemically which is great news. No fevers, chills, nausea, vomiting, or diarrhea. 05-08-2021 upon evaluation today patient appears to be doing well with regard to his wounds. Both are showing signs of improvement which is great news. Overall I am extremely pleased with where we stand today. There does not appear to be any signs of active infection locally nor systemically at this time 05-22-2021 upon evaluation today patient appears to be doing well currently in regard to his wounds. They are actually showing signs of significant improvement which is great news both on the heel as well as on the ankle. I am happy with where things stand unfortunately he was not approved for the skin substitute but nonetheless I think that were still continuing to make progress here which is exactly what we are looking for. My hope is he will continue to show signs of improvement week by week till closure. 05-23-2021 upon evaluation today patient appears to be doing well with regard to his wound. He has been tolerating the dressing changes without complication. Fortunately I do not see any evidence of active infection at this  time. No fevers, chills, nausea, vomiting, or diarrhea. 4/23; right posterior heel and left lateral malleolus. Using collagen. The left is being wrapped 3 layer on the right changed by his son. Both wounds are doing very well 06-05-2021 upon evaluation today patient appears to be doing well with regard to his wounds. Both are showing signs of improvement this is great news. Fortunately I do not see any evidence of active infection locally nor systemically which is great news. No fevers, chills, nausea, vomiting, or diarrhea. 06-12-2021 upon evaluation today patient's wounds are showing signs of excellent improvement. I am actually extremely pleased with where we stand and I think that he is headed in the right direction here. Fortunately I do not see any evidence of active infection locally or systemically which is great news. 06-26-2021 upon evaluation today patient actually appears to be doing excellent in fact his left lateral ankle appears to be completely healed which is also news. There does not appear to be any evidence of active infection locally or systemically which is great news and overall very pleased with where things stand today. [name], [name] ([id number]) 07-09-2021 upon evaluation today patient actually appears to be completely healed which is great news. Fortunately I do not see any evidence of active infection locally or systemically at this time which is great news. No fevers, chills, nausea, vomiting, or diarrhea. Electronic Signature(s) Signed: 07/10/2021 7:54:57 AM By: Worthy Keeler PA-C Entered By: Joaquim Lai  IIIMargarita Grizzle on 07/10/2021 07:54:57 [name], [name] ([id number]) -------------------------------------------------------------------------------- Physical Exam Details Patient Name: LAKEN, LOBATO. Date of Service: 07/09/2021 3:00 PM Medical Record Number: [id number] Patient Account Number: [id number] Date of Birth/Sex: 06-08-1940 (81 y.o. M) Treating RN: Primary Care  Provider: Elsie Stain Other Clinician: Referring Provider: Elsie Stain Treating Provider/Extender: Skipper Cliche in Treatment: 51 Constitutional Well-nourished and well-hydrated in no acute distress. Respiratory normal breathing without difficulty. Psychiatric this patient is able to make decisions and demonstrates good insight into disease process. Alert and Oriented x 3. pleasant and cooperative. Notes Patient's wound bed showed signs of excellent epithelization at this point which is great news and overall very pleased. I do not see any sign that this is worsening whatsoever. In fact it looks like he is completely healed. Electronic Signature(s) Signed: 07/10/2021 7:55:23 AM By: Worthy Keeler PA-C Entered By: Worthy Keeler on 07/10/2021 07:55:22 [name], [name] ([id number]) -------------------------------------------------------------------------------- Physician Orders Details Patient Name: KASPAR, ALBORNOZ. Date of Service: 07/09/2021 3:00 PM Medical Record Number: [id number] Patient Account Number: [id number] Date of Birth/Sex: 05/29/1940 (81 y.o. 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 Put garments on first thing when you wake up and remove them before bed. o Moisturize legs daily after removing compression garments. o Elevate, Exercise Daily and Avoid Standing for Long  Periods of Time. o DO YOUR BEST to sleep in the bed at night. DO NOT sleep in your recliner. Long hours of sitting in a recliner leads to swelling of the legs and/or potential wounds on your backside. Bathing/ Shower/ Hygiene o May shower; gently cleanse wound with antibacterial soap, rinse and pat dry prior to dressing wounds - Moisturize legs after showering Edema Control - Lymphedema / Segmental Compressive Device / Other o Elevate, Exercise Daily and Avoid Standing for Long Periods of Time. o Elevate legs to the level of the heart and pump ankles as often as possible o Elevate leg(s) parallel to the floor when sitting. o DO YOUR BEST to sleep in the bed at night. DO NOT sleep in your recliner. Long hours of sitting in a recliner leads to swelling of the legs and/or potential wounds on your backside. Off-Loading o Turn and reposition every 2 hours - keep pressure off of wounds/float heels in chair or bed Additional Orders / Instructions o Follow Nutritious Diet and Increase Protein Intake Electronic Signature(s) Signed: 07/09/2021 4:42:45 PM By: Sharyn Creamer RN, BSN Signed: 07/10/2021 4:11:43 PM By: Worthy Keeler PA-C Entered By: Sharyn Creamer on 07/09/2021 16:07:56 [name], [name] ([id number]) -------------------------------------------------------------------------------- Problem List Details Patient Name: ZEBADIAH, WILLERT. Date of Service: 07/09/2021 3:00 PM Medical Record Number: [id number] Patient Account Number: [id number] Date of Birth/Sex: 1940/07/07 (81 y.o. 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 02/27/2021 No Yes L97.422 Non-pressure chronic ulcer of left heel and midfoot with fat  layer 02/27/2021  No Yes exposed L97.412 Non-pressure chronic ulcer of right heel and midfoot with fat layer 04/08/2021 No Yes exposed Z85.6 Personal history of leukemia 02/27/2021 No Yes Inactive Problems Resolved Problems Electronic Signature(s) Signed: 07/09/2021 3:21:29 PM By: Worthy Keeler PA-C Entered By: Worthy Keeler on 07/09/2021 15:21:29 [name], [name] ([id number]) -------------------------------------------------------------------------------- Progress Note Details Patient Name: [name]. Date of Service: 07/09/2021 3:00 PM Medical Record Number: [id number] Patient Account Number: [id number] Date of Birth/Sex: 28-Apr-1940 (81 y.o. M) Treating RN: Primary Care Provider: Elsie Stain Other Clinician: Referring Provider: Elsie Stain Treating Provider/Extender: Skipper Cliche in Treatment: 18 Subjective Chief Complaint Information obtained from Patient Left LE Ulcer History of Present Illness (HPI) 02/27/2021 upon evaluation today patient presents for initial evaluation here in the clinic sent concerning an issue on the left lateral heel which has been present currently for about 3 weeks that he notes. With that being said he did have an issue where he hit this area on a lawnmower over the summer 2022. Nonetheless the patient tells me that unfortunately though it did not seem to give him a lot of trouble immediately following this reopened more recently. He has not had any x-rays up to this time. 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 Subsequently he did have a fairly good arterial test today here in the clinic measured at 1.03 but I am somewhat cautious with that due to the fact that his extremity is very cold nonetheless he seems to  have pretty good blood flow of the wound when I am cleaning this today. We will keep an eye on this and if I need to send him for formal arterial testing I will do so. 03/13/2021 upon evaluation today patient appears to be doing well with regard to his wound. He has been tolerating the dressing changes without complication. Fortunately he does not show any signs of active infection at this time which is great news. No fevers, chills, nausea, vomiting, or diarrhea. With that being said his x-ray was negative for any signs of obvious osteomyelitis obviously an x-ray is not the best test for this but an MRI would be. Nonetheless I do not think that we need to jump straight to that at this point. 03/27/2021 upon evaluation today patient actually appears to be doing extremely well in regard to his wounds. He has been tolerating the dressing changes on the left lateral ankle. I am actually extremely pleased with how this appears and I think that he is making excellent progress. Fortunately I do not see any evidence of active infection at this time which is great news. 04/03/2021 upon evaluation today patient appears to be doing well with regard to his wound. Has been tolerating the dressing changes without complication. Fortunately there is no sign of active infection locally or systemically which is great news. No fevers, chills, nausea, vomiting, or diarrhea. 04/08/2021 upon evaluation today patient appears to be doing poorly today in regard to his right heel which unfortunately has an open wound that was not present at the last evaluation. There does not appear to be any signs of active infection locally or systemically at this time which is great news. No fevers, chills, nausea, vomiting, or diarrhea. 04/17/2021  upon evaluation patient appears to be doing well today in regard to his wound on both locations. I do feel like he is showing signs of improvement which is great news and overall I do feel like that  were headed in the right direction reason silver collagen at both locations currently. 04/24/2021 upon evaluation today patient appears to be doing well with regard to his wounds. We did get a denial for the skin itself unfortunately but the good news is he is actually showing some signs of improvement already so this is probably not even going to become necessary. 05/01/2021 upon evaluation today patient's wounds are actually showing signs of excellent improvement. I am actually extremely pleased with where we stand today. I do not see any evidence of active infection locally nor systemically which is great news. No fevers, chills, nausea, vomiting, or diarrhea. 05-08-2021 upon evaluation today patient appears to be doing well with regard to his wounds. Both are showing signs of improvement which is great news. Overall I am extremely pleased with where we stand today. There does not appear to be any signs of active infection locally nor systemically at this time 05-22-2021 upon evaluation today patient appears to be doing well currently in regard to his wounds. They are actually showing signs of significant improvement which is great news both on the heel as well as on the ankle. I am happy with where things stand unfortunately he was not approved for the skin substitute but nonetheless I think that were still continuing to make progress here which is exactly what we are looking for. My hope is he will continue to show signs of improvement week by week till closure. 05-23-2021 upon evaluation today patient appears to be doing well with regard to his wound. He has been tolerating the dressing changes without complication. Fortunately I do not see any evidence of active infection at this time. No fevers, chills, nausea, vomiting, or diarrhea. 4/23; right posterior heel and left lateral malleolus. Using collagen. The left is being wrapped 3 layer on the right changed by his son. Both wounds are doing very  well 06-05-2021 upon evaluation today patient appears to be doing well with regard to his wounds. Both are showing signs of improvement this is great news. Fortunately I do not see any evidence of active infection locally nor systemically which is great news. No fevers, chills, nausea, vomiting, or diarrhea. 06-12-2021 upon evaluation today patient's wounds are showing signs of excellent improvement. I am actually extremely pleased with where we stand and I think that he is headed in the right direction here. Fortunately I do not see any evidence of active infection locally or systemically which is great news. [name], [name] ([id number]) 06-26-2021 upon evaluation today patient actually appears to be doing excellent in fact his left lateral ankle appears to be completely healed which is also news. There does not appear to be any evidence of active infection locally or systemically which is great news and overall very pleased with where things stand today. 07-09-2021 upon evaluation today patient actually appears to be completely healed which is great news. Fortunately I do not see any evidence of active infection locally or systemically at this time which is great news. No fevers, chills, nausea, vomiting, or diarrhea. Objective Constitutional Well-nourished and well-hydrated in no acute distress. Vitals Time Taken: 3:42 PM, Height: 76 in, Weight: 200 lbs, BMI: 24.3, Temperature: 98.1 F, Pulse: 65 bpm, Respiratory Rate: 18 breaths/min, Blood Pressure: 135/75 mmHg. Respiratory normal  breathing without difficulty. Psychiatric this patient is able to make decisions and demonstrates good insight into disease process. Alert and Oriented x 3. pleasant and cooperative. General Notes: Patient's wound bed showed signs of excellent epithelization at this point which is great news and overall very pleased. I do not see any sign that this is worsening whatsoever. In fact it looks like he is completely  healed. Integumentary (Hair, Skin) Wound #2 status is Healed - Epithelialized. Original cause of wound was Gradually Appeared. The date acquired was: 04/05/2021. The wound has been in treatment 13 weeks. The wound is located on the Right Calcaneus. The wound measures 0cm length x 0cm width x 0cm depth; 0cm^2 area and 0cm^3 volume. There is no tunneling or undermining noted. There is a none present amount of drainage noted. There is no granulation within the wound bed. There is no necrotic tissue within the wound bed. 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 Put garments on first thing when you wake up and remove them before bed. Moisturize legs daily after removing compression garments. Elevate, Exercise Daily and Avoid Standing for Long Periods of Time. DO YOUR BEST to sleep in the bed at night. DO NOT sleep in your recliner. Long hours of sitting in a recliner leads to swelling of the legs and/or potential wounds on your backside. Bathing/ Shower/ Hygiene: May shower; gently cleanse wound with antibacterial soap, rinse and pat dry prior to dressing wounds - Moisturize legs after showering Edema Control - Lymphedema / Segmental Compressive Device / Other: Elevate, Exercise Daily and Avoid Standing for Long Periods of Time. Elevate legs to the level of the heart and pump ankles as often as possible Elevate leg(s) parallel to the floor when sitting. DO YOUR BEST to sleep in the bed at night. DO NOT sleep in your recliner. Long hours of sitting in a recliner leads to swelling of the legs and/or [name], [name]. ([id number]) potential wounds on your  backside. Off-Loading: Turn and reposition every 2 hours - keep pressure off of wounds/float heels in chair or bed Additional Orders / Instructions: Follow Nutritious Diet and Increase Protein Intake 1. I would recommend that we go ahead and continue with the wound care measures as before and the patient is in agreement with the plan. This includes the use of the bordered foam dressings to cover and protect I think that is doing a good job here. 2. I am also can recommend that we have the patient continue to monitor for any signs of worsening or reopening. If anything changes he should let me know. 3. I am going to suggest that he should be wearing compression socks I think a zipper compression stocking would be appropriate they can order 1 off [place] just make sure that there is a piece of cloth that covers underneath the zipper to protect. We will see patient back for reevaluation in 1 week here in the clinic. If anything worsens or changes patient will contact our office for additional recommendations. Electronic Signature(s) Signed: 07/10/2021 7:56:23 AM By: Worthy Keeler PA-C Entered By: Worthy Keeler on 07/10/2021 07:56:23 [name], [name] ([id number]) -------------------------------------------------------------------------------- SuperBill Details Patient Name: [name], AUBRY. Date of Service: 07/09/2021 Medical Record Number: [id number]  Patient Account Number: [id number] Date of Birth/Sex: [birth year] (81 y.o. 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 with fat layer exposed Z85.6 Personal  history of leukemia Facility Procedures CPT4 Code: [id number] Description: [phone] - WOUND CARE VISIT-LEV 2 EST PT Modifier: 25 Quantity: 1 Physician Procedures CPT4 Code: [id number] Description: [id number] - WC PHYS LEVEL 3 - EST PT Modifier: Quantity: 1 CPT4 Code: Description: ICD-10 Diagnosis Description 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

## 2021-07-09 NOTE — Progress Notes (Addendum)
TC, [place] ([id number]) Visit Report for 07/09/2021 Arrival Information Details Patient Name: [name], [name]. Date of Service: 07/09/2021 3:00 PM Medical Record Number: [id number] Patient Account Number: [id number] Date of Birth/Sex: 07-16-1940 (81 y.o. M) Treating RN: [name] Primary Care [name]: [name] Other Clinician: Referring [name]: [name] Treating [name]/Extender: [name] in Treatment: 50 Visit Information History Since Last Visit Added or deleted any medications: No Patient Arrived: Wheel Chair Any new allergies or adverse reactions: No Arrival Time: 15:40 Had a fall or experienced change in No Accompanied By: daughter activities of daily living that may affect Transfer Assistance: None risk of falls: Patient Identification Verified: Yes Signs or symptoms of abuse/neglect since last visito No Secondary Verification Process Completed: Yes Hospitalized since last visit: No Patient Requires Transmission-Based No Implantable device outside of the clinic excluding No Precautions: cellular tissue based products placed in the center Patient Has Alerts: Yes since last visit: Patient Alerts: Patient on Blood Has Dressing in Place as Prescribed: Yes Thinner Has Compression in Place as Prescribed: Yes '81MG'$  x 2 Pain Present Now: No NOT DIABETIC Electronic Signature(s) Signed: 07/09/2021 4:42:45 PM By: Michael Creamer RN, BSN Entered By: [name] on 07/09/2021 15:41:34 [name] ([id number]) -------------------------------------------------------------------------------- Clinic Level of Care Assessment Details Patient Name: [name], [name]. Date of Service: 07/09/2021 3:00 PM Medical Record Number: [id number] Patient Account Number: [id number] Date of Birth/Sex: 06-Jun-1940 (81 y.o. M) Treating RN: [name] Primary Care [name]: [name] Other Clinician: Referring [name]: [name] Treating  [name]/Extender: [name] in Treatment: 18 Clinic Level of Care Assessment Items TOOL 4 Quantity Score X - Use when only an EandM is performed on FOLLOW-UP visit 1 0 ASSESSMENTS - Nursing Assessment / Reassessment X - Reassessment of Co-morbidities (includes updates in patient status) 1 10 X- 1 5 Reassessment of Adherence to Treatment Plan ASSESSMENTS - Wound and Skin Assessment / Reassessment X - Simple Wound Assessment / Reassessment - one wound 1 5 '[]'$  - 0 Complex Wound Assessment / Reassessment - multiple wounds '[]'$  - 0 Dermatologic / Skin Assessment (not related to wound area) ASSESSMENTS - Focused Assessment X - Circumferential Edema Measurements - multi extremities 1 5 '[]'$  - 0 Nutritional Assessment / Counseling / Intervention '[]'$  - 0 Lower Extremity Assessment (monofilament, tuning fork, pulses) '[]'$  - 0 Peripheral Arterial Disease Assessment (using hand held doppler) ASSESSMENTS - Ostomy and/or Continence Assessment and Care '[]'$  - Incontinence Assessment and Management 0 '[]'$  - 0 Ostomy Care Assessment and Management (repouching, etc.) PROCESS - Coordination of Care X - Simple Patient / Family Education for ongoing care 1 15 '[]'$  - 0 Complex (extensive) Patient / Family Education for ongoing care X- 1 10 Staff obtains Programmer, systems, Records, Test Results / Process Orders '[]'$  - 0 Staff telephones HHA, Nursing Homes / Clarify orders / etc '[]'$  - 0 Routine Transfer to another Facility (non-emergent condition) '[]'$  - 0 Routine Hospital Admission (non-emergent condition) '[]'$  - 0 New Admissions / Biomedical engineer / Ordering NPWT, Apligraf, etc. '[]'$  - 0 Emergency Hospital Admission (emergent condition) X- 1 10 Simple Discharge Coordination '[]'$  - 0 Complex (extensive) Discharge Coordination PROCESS - Special Needs '[]'$  - Pediatric / Minor Patient Management 0 '[]'$  - 0 Isolation Patient Management '[]'$  - 0 Hearing / Language / Visual special needs '[]'$  - 0 Assessment of  Community assistance (transportation, D/C planning, etc.) '[]'$  - 0 Additional assistance / Altered mentation '[]'$  - 0 Support Surface(s) Assessment (bed, cushion, seat, etc.) INTERVENTIONS - Wound Cleansing /  Measurement [name], [name] ([id number]) X- 1 5 Simple Wound Cleansing - one wound '[]'$  - 0 Complex Wound Cleansing - multiple wounds '[]'$  - 0 Wound Imaging (photographs - any number of wounds) '[]'$  - 0 Wound Tracing (instead of photographs) X- 1 5 Simple Wound Measurement - one wound '[]'$  - 0 Complex Wound Measurement - multiple wounds INTERVENTIONS - Wound Dressings '[]'$  - Small Wound Dressing one or multiple wounds 0 '[]'$  - 0 Medium Wound Dressing one or multiple wounds '[]'$  - 0 Large Wound Dressing one or multiple wounds '[]'$  - 0 Application of Medications - topical '[]'$  - 0 Application of Medications - injection INTERVENTIONS - Miscellaneous '[]'$  - External ear exam 0 '[]'$  - 0 Specimen Collection (cultures, biopsies, blood, body fluids, etc.) '[]'$  - 0 Specimen(s) / Culture(s) sent or taken to Lab for analysis '[]'$  - 0 Patient Transfer (multiple staff / Civil Service fast streamer / Similar devices) '[]'$  - 0 Simple Staple / Suture removal (25 or less) '[]'$  - 0 Complex Staple / Suture removal (26 or more) '[]'$  - 0 Hypo / Hyperglycemic Management (close monitor of Blood Glucose) '[]'$  - 0 Ankle / Brachial Index (ABI) - do not check if billed separately X- 1 5 Vital Signs Has the patient been seen at the hospital within the last three years: Yes Total Score: 75 Level Of Care: New/Established - Level 2 Electronic Signature(s) Signed: 07/09/2021 4:42:45 PM By: Michael Creamer RN, BSN Entered By: [name] on 07/09/2021 16:42:01 [name] ([id number]) -------------------------------------------------------------------------------- Encounter Discharge Information Details Patient Name: [name]. Date of Service: 07/09/2021 3:00 PM Medical Record Number: [id number] Patient Account Number:  [id number] Date of Birth/Sex: 06/10/1940 (81 y.o. M) Treating RN: [name] Primary Care Sacha Topor: [name] Other Clinician: Referring Danniella Robben: [name] Treating Delwin Raczkowski/Extender: [name] in Treatment: 18 Encounter Discharge Information Items Discharge Condition: Stable Ambulatory Status: Wheelchair Discharge Destination: Home Transportation: Private Auto Accompanied By: daughter Schedule Follow-up Appointment: Yes Clinical Summary of Care: Patient Declined Electronic Signature(s) Signed: 07/09/2021 4:42:45 PM By: Michael Creamer RN, BSN Entered By: [name] on 07/09/2021 16:09:11 [name], [name] ([id number]) -------------------------------------------------------------------------------- Lower Extremity Assessment Details Patient Name: [name], [name]. Date of Service: 07/09/2021 3:00 PM Medical Record Number: [id number] Patient Account Number: [id number] Date of Birth/Sex: 31-Jan-1941 (81 y.o. M) Treating RN: [name] Primary Care Petra Dumler: [name] Other Clinician: Referring Tashanda Fuhrer: [name] Treating Karna Abed/Extender: Jeri Cos Weeks in Treatment: 18 Edema Assessment Assessed: Shirlyn Goltz: No] Patrice Paradise: Yes] [Left: Edema] [Right: :] Calf Left: Right: Point of Measurement: From Medial Instep 34.5 cm Ankle Left: Right: Point of Measurement: From Medial Instep 26.3 cm Vascular Assessment Pulses: Dorsalis Pedis Palpable: [Right:Yes] Electronic Signature(s) Signed: 07/09/2021 4:42:45 PM By: Michael Creamer RN, BSN Entered By: [name] on 07/09/2021 15:49:54 TRAYDEN, BRANDY ([id number]) -------------------------------------------------------------------------------- Multi Wound Chart Details Patient Name: [name]. Date of Service: 07/09/2021 3:00 PM Medical Record Number: [id number] Patient Account Number: [id number] Date of Birth/Sex: Dec 11, 1940 (81 y.o. M) Treating RN: [name] Primary Care Vy Badley: [name] Other Clinician: Referring Shanley Furlough: [name] Treating Ayrianna Mcginniss/Extender: [name] in Treatment: 18 Vital Signs Height(in): 76 Pulse(bpm): 54 Weight(lbs): 200 Blood Pressure(mmHg): 135/75 Body Mass Index(BMI): 24.3 Temperature(F): 98.1 Respiratory Rate(breaths/min): 18 Photos: [N/A:N/A] Wound Location: Right Calcaneus N/A N/A Wounding Event: Gradually Appeared N/A N/A Primary Etiology: Neuropathic Ulcer-Non Diabetic N/A N/A Comorbid History: Cataracts, Neuropathy N/A N/A Date Acquired: 04/05/2021 N/A N/A Weeks of Treatment: 13 N/A N/A Wound Status: Open N/A N/A Wound Recurrence: No N/A  N/A Measurements L x W x D (cm) 0.1x0.1x0.1 N/A N/A Area (cm) : 0.008 N/A N/A Volume (cm) : 0.001 N/A N/A % Reduction in Area: 97.70% N/A N/A % Reduction in Volume: 97.10% N/A N/A Classification: Full Thickness Without Exposed N/A N/A Support Structures Exudate Amount: None Present N/A N/A Granulation Amount: None Present (0%) N/A N/A Necrotic Amount: None Present (0%) N/A N/A Exposed Structures: Fascia: No N/A N/A Fat Layer (Subcutaneous Tissue): No Tendon: No Muscle: No Joint: No Bone: No Epithelialization: Large (67-100%) N/A N/A Treatment Notes Electronic Signature(s) Signed: 07/09/2021 4:42:45 PM By: Michael Creamer RN, BSN Entered By: [name] on 07/09/2021 15:51:20 [name] ([id number]) -------------------------------------------------------------------------------- Pain Assessment Details Patient Name: [name]. Date of Service: 07/09/2021 3:00 PM Medical Record Number: [id number] Patient Account Number: [id number] Date of Birth/Sex: [birth year] (81 y.o. M) Treating RN: [name] Primary Care Addisen Chappelle: [name] Other Clinician: Referring Atheena Spano: [name] Treating Messina Kosinski/Extender: [name] in Treatment: 18 Active Problems Location of Pain Severity and Description of Pain Patient Has Paino No Site  Locations Pain Management and Medication Current Pain Management: Electronic Signature(s) Signed: 07/09/2021 4:42:45 PM By: Michael Creamer RN, BSN Entered By: [name] on 07/09/2021 15:43:20 [name] ([id number]) -------------------------------------------------------------------------------- Patient/Caregiver Education Details Patient Name: [name], [name]. Date of Service: 07/09/2021 3:00 PM Medical Record Number: [id number] Patient Account Number: [id number] Date of Birth/Gender: 23-Apr-1940 (81 y.o. M) Treating RN: [name] Primary Care Physician: [name] Other Clinician: Referring Physician: Elsie Morgan Treating Physician/Extender: [name] in Treatment: 18 Education Assessment Education Provided To: Patient Education Topics Provided Nutrition: Methods: Explain/Verbal Responses: State content correctly Venous: Methods: Explain/Verbal Responses: State content correctly Wound/Skin Impairment: Methods: Explain/Verbal Responses: State content correctly Electronic Signature(s) Signed: 07/09/2021 4:42:45 PM By: Michael Creamer RN, BSN Entered By: [name] on 07/09/2021 Zena, Skye R. ([id number]) -------------------------------------------------------------------------------- Wound Assessment Details Patient Name: [name], [name]. Date of Service: 07/09/2021 3:00 PM Medical Record Number: [id number] Patient Account Number: [id number] Date of Birth/Sex: October 31, 1940 (81 y.o. M) Treating RN: [name] Primary Care Shuayb Schepers: [name] Other Clinician: Referring Gleb Mcguire: [name] Treating Ayari Liwanag/Extender: Jeri Cos Weeks in Treatment: 18 Wound Status Wound Number: 2 Primary Etiology: Neuropathic Ulcer-Non Diabetic Wound Location: Right Calcaneus Wound Status: Healed - Epithelialized Wounding Event: Gradually Appeared Comorbid History: Cataracts, Neuropathy Date Acquired: 04/05/2021 Weeks Of Treatment:  13 Clustered Wound: No Photos Wound Measurements Length: (cm) 0 Width: (cm) 0 Depth: (cm) 0 Area: (cm) 0 Volume: (cm) 0 % Reduction in Area: 100% % Reduction in Volume: 100% Epithelialization: Large (67-100%) Tunneling: No Undermining: No Wound Description Classification: Full Thickness Without Exposed Support Structure Exudate Amount: None Present s Foul Odor After Cleansing: No Slough/Fibrino No Wound Bed Granulation Amount: None Present (0%) Exposed Structure Necrotic Amount: None Present (0%) Fascia Exposed: No Fat Layer (Subcutaneous Tissue) Exposed: No Tendon Exposed: No Muscle Exposed: No Joint Exposed: No Bone Exposed: No Electronic Signature(s) Signed: 07/09/2021 4:42:45 PM By: Michael Creamer RN, BSN Entered By: [name] on 07/09/2021 [place], [place]. ([id number]) -------------------------------------------------------------------------------- [place] Details Patient Name: [name]. Date of Service: 07/09/2021 3:00 PM Medical Record Number: [id number] Patient Account Number: [id number] Date of Birth/Sex: Oct 20, 1940 (81 y.o. M) Treating RN: [name] Primary Care Elif Yonts: [name] Other Clinician: Referring Rishan Oyama: [name] Treating Aramis Weil/Extender: [name] in Treatment: 18 Vital Signs Time Taken: 15:42 Temperature (F): 98.1 Height (in): 76 Pulse (bpm): 65 Weight (lbs): 200 Respiratory Rate (breaths/min): 18 Body Mass Index (BMI):  24.3 Blood Pressure (mmHg): 135/75 Reference Range: 80 - 120 mg / dl Electronic Signature(s) Signed: 07/09/2021 4:42:45 PM By: Michael Creamer RN, BSN Entered By: [name] on 07/09/2021 15:43:14

## 2021-07-10 ENCOUNTER — Ambulatory Visit: Payer: Medicare PPO | Admitting: Physician Assistant

## 2021-07-11 ENCOUNTER — Telehealth: Payer: Self-pay | Admitting: Family Medicine

## 2021-07-11 NOTE — Telephone Encounter (Signed)
Pt daughter [name] called and said that Dr Alvy Bimler wanted pt to follow up with Dr Damita Dunnings between seeing her each time. So it would be about twice a year compared to the once a year for his cpe. She wanted to know if that's what Dr Damita Dunnings would want. Callback is [phone]

## 2021-07-15 NOTE — Telephone Encounter (Signed)
Called and left message on Susans VM that patient would be fine to be seen on 09/2021; advised to call back to schedule appt.

## 2021-07-15 NOTE — Telephone Encounter (Signed)
Yes, this is reasonable.   He saw Dr. Alvy Bimler in 06/2021.  Would be reasonable to schedule in ~09/2021.  Thanks.

## 2021-07-16 ENCOUNTER — Encounter: Payer: Self-pay | Admitting: Hematology and Oncology

## 2021-07-18 NOTE — Telephone Encounter (Signed)
Appt has been made for 09/23/21 at 12:30 pm.

## 2021-07-18 NOTE — Telephone Encounter (Signed)
Called and left message on [name] VM that patient would be fine to be seen on 09/2021; advised to call back to schedule appt.

## 2021-08-15 ENCOUNTER — Encounter: Payer: Self-pay | Admitting: Hematology and Oncology

## 2021-09-23 ENCOUNTER — Ambulatory Visit: Payer: Medicare PPO | Admitting: Family Medicine

## 2021-09-23 ENCOUNTER — Encounter: Payer: Self-pay | Admitting: Family Medicine

## 2021-09-23 VITALS — BP 116/58 | HR 62 | Temp 98.3°F | Ht 76.0 in

## 2021-09-23 DIAGNOSIS — R5381 Other malaise: Secondary | ICD-10-CM | POA: Diagnosis not present

## 2021-09-23 DIAGNOSIS — B0229 Other postherpetic nervous system involvement: Secondary | ICD-10-CM | POA: Diagnosis not present

## 2021-09-23 DIAGNOSIS — Z659 Problem related to unspecified psychosocial circumstances: Secondary | ICD-10-CM | POA: Diagnosis not present

## 2021-09-23 DIAGNOSIS — R350 Frequency of micturition: Secondary | ICD-10-CM | POA: Diagnosis not present

## 2021-09-23 DIAGNOSIS — G6181 Chronic inflammatory demyelinating polyneuritis: Secondary | ICD-10-CM

## 2021-09-23 LAB — COMPREHENSIVE METABOLIC PANEL
ALT: 35 U/L (ref 0–53)
AST: 30 U/L (ref 0–37)
Albumin: 3.7 g/dL (ref 3.5–5.2)
Alkaline Phosphatase: 97 U/L (ref 39–117)
BUN: 21 mg/dL (ref 6–23)
CO2: 27 mEq/L (ref 19–32)
Calcium: 8.8 mg/dL (ref 8.4–10.5)
Chloride: 105 mEq/L (ref 96–112)
Creatinine, Ser: 0.96 mg/dL (ref 0.40–1.50)
GFR: 74.17 mL/min (ref >60.00)
Glucose, Bld: 114 mg/dL — ABNORMAL HIGH (ref 70–99)
Potassium: 4.2 mEq/L (ref 3.5–5.1)
Sodium: 139 mEq/L (ref 135–145)
Total Bilirubin: 1.4 mg/dL — ABNORMAL HIGH (ref 0.2–1.2)
Total Protein: 6.3 g/dL (ref 6.0–8.3)

## 2021-09-23 LAB — CBC WITH DIFFERENTIAL/PLATELET
Basophils Absolute: 0 10*3/uL (ref 0.0–0.1)
Basophils Relative: 0.5 % (ref 0.0–3.0)
Eosinophils Absolute: 0 10*3/uL (ref 0.0–0.7)
Eosinophils Relative: 1 % (ref 0.0–5.0)
HCT: 27.5 % — ABNORMAL LOW (ref 39.0–52.0)
Hemoglobin: 9.3 g/dL — ABNORMAL LOW (ref 13.0–17.0)
Lymphocytes Relative: 65.6 % — ABNORMAL HIGH (ref 12.0–46.0)
Lymphs Abs: 2.2 10*3/uL (ref 0.7–4.0)
MCHC: 33.7 g/dL (ref 30.0–36.0)
MCV: 119.2 fl — ABNORMAL HIGH (ref 78.0–100.0)
Monocytes Absolute: 0.1 10*3/uL (ref 0.1–1.0)
Monocytes Relative: 3.7 % (ref 3.0–12.0)
Neutro Abs: 1 10*3/uL — ABNORMAL LOW (ref 1.4–7.7)
Neutrophils Relative %: 29.2 % — ABNORMAL LOW (ref 43.0–77.0)
Platelets: 244 10*3/uL (ref 150.0–400.0)
RBC: 2.31 Mil/uL — ABNORMAL LOW (ref 4.22–5.81)
RDW: 18.6 % — ABNORMAL HIGH (ref 11.5–15.5)
WBC: 3.3 10*3/uL — ABNORMAL LOW (ref 4.0–10.5)

## 2021-09-23 MED ORDER — FINASTERIDE 5 MG PO TABS: 5.0000 mg | ORAL_TABLET | Freq: Every day | ORAL | 3 refills | Status: DC

## 2021-09-23 MED ORDER — GABAPENTIN 300 MG PO CAPS: ORAL_CAPSULE | ORAL | 3 refills | Status: DC

## 2021-09-23 NOTE — Patient Instructions (Addendum)
Go to the lab on the way out.   If you have mychart we'll likely use that to update you.    Take care.  Glad to see you. I would increase gabapentin to '600mg'$  twice a day.   I'll update Dr. Alvy Bimler.

## 2021-09-23 NOTE — Progress Notes (Unsigned)
He had seen wound clinic.  No wound clinic currently.  L ankle and R heel have healed.    D/w pt about current MTX dose and gabapentin. Need to check labs and update hematology.    Higher dose of gabapentin helped the pain, ie '600mg'$  BID was better then '300mg'$  AM and '600mg'$  PM.    He is using rollator in the house, has a walker to use.  He needs help getting on the tractor, can still use the pedals and can still drive that.  But he isn't driving a car.    He needs to push off to get up from a chair.  Patient/family has modified his home for safety, ie shower chair.  He can still shower independently.  He has a lift chair.    His wife has memory changes and that is a change.  Patient isn't cooking.     D/w pt about social work referral.  Placed at [facility].  He needs extra help at home.    D/w pt about urinary frequency and possibly taking finasteride with routine instructions given to patient.  Printed rx, he'll consider.    Meds, vitals, and allergies reviewed.   ROS: Per HPI unless specifically indicated in ROS section   Ctab Rrr 1+ BLE edema.

## 2021-09-24 ENCOUNTER — Ambulatory Visit: Payer: Self-pay | Admitting: *Deleted

## 2021-09-24 ENCOUNTER — Telehealth: Payer: Self-pay | Admitting: *Deleted

## 2021-09-24 DIAGNOSIS — R350 Frequency of micturition: Secondary | ICD-10-CM | POA: Insufficient documentation

## 2021-09-24 DIAGNOSIS — Z659 Problem related to unspecified psychosocial circumstances: Secondary | ICD-10-CM | POA: Insufficient documentation

## 2021-09-24 NOTE — Assessment & Plan Note (Signed)
D/w pt about current MTX dose and gabapentin.  We talked about checking his routine labs today (especially since he has been on lower dose of methotrexate) and I can update hematology based on that.

## 2021-09-24 NOTE — Chronic Care Management (AMB) (Signed)
  Care Coordination   Note   09/24/2021 Name: [name] MRN: [id number] DOB: 10-28-1940  [name] is a 81 y.o. year old male who sees Tonia Ghent, MD for primary care. I reached out to [name] by phone today to offer care coordination services.  [name] was given information about Care Coordination services today including:   The Care Coordination services include support from the care team which includes your Nurse Coordinator, Clinical Social Worker, or Pharmacist.  The Care Coordination team is here to help remove barriers to the health concerns and goals most important to you. Care Coordination services are voluntary, and the patient may decline or stop services at any time by request to their care team member.   Care Coordination Consent Status: Patient daughter [name] DPR on file agreed to services and verbal consent obtained.    Follow up plan:  Telephone appointment with care coordination team member scheduled for:  09/24/21  Encounter Outcome:  Pt. Scheduled  [place]  Direct Dial: [phone]

## 2021-09-24 NOTE — Assessment & Plan Note (Signed)
D/w pt about urinary frequency and possibly taking finasteride with routine instructions given to patient.  Printed rx, he'll consider.

## 2021-09-24 NOTE — Assessment & Plan Note (Signed)
We talked about functional adaptation at home given his difficulty with mobility.  He is using rollator in the house, has a walker to use.  He needs help getting on the tractor, can still use the pedals and can still drive that.  But he isn't driving a car.  He feels better after working in the yard.  He wants to continue this as long as possible.  He needs to push off to get up from a chair.  Patient/family has modified his home for safety, ie shower chair.  He can still shower independently.  He has a lift chair.

## 2021-09-24 NOTE — Assessment & Plan Note (Signed)
Increase gabapentin back to 600 mg twice a day.

## 2021-09-24 NOTE — Assessment & Plan Note (Signed)
His wife has memory changes and that is a change.  Patient isn't cooking.  D/w pt about social work referral.  Placed at [facility].  He needs extra help at home.

## 2021-09-25 ENCOUNTER — Encounter: Payer: Medicare PPO | Admitting: *Deleted

## 2021-09-25 NOTE — Patient Outreach (Signed)
  Care Coordination   Initial Visit Note   09/25/2021 Name: ZYKEE AVAKIAN MRN: [id number] DOB: Jan 18, 1941  GOVERNOR MATOS is a 81 y.o. year old male who sees Tonia Ghent, MD for primary care. I spoke with  Ermalinda Barrios daughter by phone today  What matters to the patients health and wellness today?  Community Resources for in home care    Goals Addressed               This [place] for in home care (pt-stated)        Care Coordination Interventions: Initial phone call to patient's daughter Manuela Schwartz to explore options for in home care for patient Patient's daughter confirmed that she and her brother take care of all of patient's care needs- including transportation to medical appointments, meals however they are requesting additional options for assistance Confirmed that patient has a med alert system, and is home has been renovated to accommodate his mobility needs Meals on Wheels discussed, however patient's daughter will continue to consider this but will not need referral at this time. Adult Day Programs also discussed as an option to increase patient's socialization Adult Day Program information as well as list of in home care agencies emailed to daughter at [email or web address]         SDOH assessments and interventions completed:  Yes  SDOH Interventions Today    Flowsheet Row Most Recent Value  SDOH Interventions   Financial Strain Interventions Intervention Not Indicated  Housing Interventions Intervention Not Indicated  Transportation Interventions Intervention Not Indicated        Care Coordination Interventions Activated:  Yes  Care Coordination Interventions:  Yes, provided   Follow up plan: Follow up call scheduled for 10/01/21 3    Encounter Outcome:  Pt. Scheduled

## 2021-09-25 NOTE — Patient Instructions (Signed)
Visit Information  Thank you for taking time to visit with me today. Please don't hesitate to contact me if I can be of assistance to you.   Following are the goals we discussed today:   Goals Addressed               This Visit's Progress     Community Resources for in home care (pt-stated)        Care Coordination Interventions: Initial phone call to patient's daughter Manuela Schwartz to explore options for in home care for patient Patient's daughter confirmed that she and her brother take care of all of patient's care needs- including transportation to medical appointments, meals however they are requesting additional options for assistance Confirmed that patient has a med alert system, and is home has been renovated to accommodate his mobility needs Meals on Wheels discussed, however patient's daughter will continue to consider this but will not need referral at this time. Adult Day Programs also discussed as an option to increase patient's socialization Adult Day Program information as well as list of in home care agencies emailed to daughter at [email or web address]         Our next appointment is by telephone on 10/01/21 at 3:30pm to confirm and review resources provided  Please call the care guide team at [phone] if you need to cancel or reschedule your appointment.   If you are experiencing a Mental Health or [place] or need someone to talk to, please call the Suicide and Crisis Lifeline: 988   Patient verbalizes understanding of instructions and care plan provided today and agrees to view in [place]. Active MyChart status and patient understanding of how to access instructions and care plan via MyChart confirmed with patient.     Telephone follow up appointment with care management team member scheduled for: 10/01/21  Elliot Gurney, [place] Worker  [facility] Care Management [phone]

## 2021-09-30 ENCOUNTER — Ambulatory Visit: Payer: Self-pay | Admitting: *Deleted

## 2021-09-30 NOTE — Patient Outreach (Signed)
  Care Coordination   Resource Lists provided-  Visit Note   09/30/2021 Name: FUMIO VANDAM MRN: [id number] DOB: Nov 18, 1940  ARMANDO BUKHARI is a 81 y.o. year old male who sees Tonia Ghent, MD for primary care. I  emailed the [place] list as well as list of in home care agencies for review  What matters to the patients health and wellness today?  In home care support    Goals Addressed               This [place] for in home care (pt-stated)        Care Coordination Interventions: Patient's daughter Manuela Schwartz requesting resources  for in home care for patient Adult Day Program information as well as [facility] including in home care agencies emailed to daughter at [email or web address]         SDOH assessments and interventions completed:  Yes     Care Coordination Interventions Activated:  Yes  Care Coordination Interventions:  Yes, provided   Follow up plan: Follow up call scheduled for 10/01/21    Encounter Outcome:  Pt. Visit Completed

## 2021-10-01 ENCOUNTER — Ambulatory Visit: Payer: Self-pay | Admitting: *Deleted

## 2021-10-01 NOTE — Patient Outreach (Signed)
  Care Coordination   Follow Up Visit Note   10/01/2021 Name: [name] MRN: [id number] DOB: 1940/05/25  [name] is a 81 y.o. year old male who sees Michael Ghent, MD for primary care. I spoke with  [name] daughter [name] by phone today.  What matters to the patients health and wellness today?  In home care resources    Goals Addressed               This [place] for in home care (pt-stated)        Care Coordination Interventions: Patient's daughter [name] requesting resources  for in home care for patient Adult Day Program information as well as [facility] including in home care agencies emailed to daughter at [email or web address] Phone call to patient's daughter who confirmed that she has received the information and will review. This social worker's contact information provided, patient's daughter encouraged to call with any additional questions         SDOH assessments and interventions completed:  Yes  SDOH Interventions Today    Flowsheet Row Most Recent Value  SDOH Interventions   Food Insecurity Interventions Intervention Not Indicated        Care Coordination Interventions Activated:  Yes  Care Coordination Interventions:  Yes, provided   Follow up plan: No further intervention required.   Encounter Outcome:  Pt. Visit Completed

## 2021-10-01 NOTE — Patient Instructions (Signed)
Visit Information  Thank you for taking time to visit with me today. Please don't hesitate to contact me if I can be of assistance to you.   Following are the goals we discussed today:   Goals Addressed               This Visit's Progress     Community Resources for in home care (pt-stated)        Care Coordination Interventions: Patient's daughter Manuela Schwartz requesting resources  for in home care for patient Adult Day Program information as well as [facility] including in home care agencies emailed to daughter at [email or web address] Phone call to patient's daughter who confirmed that she has received the information and will review. This social worker's contact information provided, patient's daughter encouraged to call with any additional questions           Please call the care guide team at [phone] if you need to cancel or reschedule your appointment.   If you are experiencing a Mental Health or [place] or need someone to talk to, please call the Suicide and Crisis Lifeline: 988   Patient verbalizes understanding of instructions and care plan provided today and agrees to view in [place]. Active MyChart status and patient understanding of how to access instructions and care plan via MyChart confirmed with patient.     No further follow up required: patient's daughter will review resources provided and will call with any additional questions  [address], [place] Worker  [facility] Care Management [phone]

## 2021-10-06 ENCOUNTER — Other Ambulatory Visit: Payer: Self-pay | Admitting: Hematology and Oncology

## 2021-12-02 DIAGNOSIS — H40053 Ocular hypertension, bilateral: Secondary | ICD-10-CM | POA: Diagnosis not present

## 2021-12-02 DIAGNOSIS — Z961 Presence of intraocular lens: Secondary | ICD-10-CM | POA: Diagnosis not present

## 2021-12-04 ENCOUNTER — Inpatient Hospital Stay: Payer: Medicare PPO | Admitting: Hematology and Oncology

## 2021-12-04 ENCOUNTER — Inpatient Hospital Stay: Payer: Medicare PPO | Attending: Hematology and Oncology

## 2021-12-04 ENCOUNTER — Encounter: Payer: Self-pay | Admitting: Hematology and Oncology

## 2021-12-04 ENCOUNTER — Other Ambulatory Visit: Payer: Self-pay

## 2021-12-04 VITALS — BP 108/53 | HR 55 | Temp 97.4°F | Resp 18

## 2021-12-04 DIAGNOSIS — C91Z0 Other lymphoid leukemia not having achieved remission: Secondary | ICD-10-CM

## 2021-12-04 DIAGNOSIS — Z79899 Other long term (current) drug therapy: Secondary | ICD-10-CM | POA: Insufficient documentation

## 2021-12-04 DIAGNOSIS — D539 Nutritional anemia, unspecified: Secondary | ICD-10-CM | POA: Diagnosis not present

## 2021-12-04 DIAGNOSIS — D472 Monoclonal gammopathy: Secondary | ICD-10-CM

## 2021-12-04 DIAGNOSIS — G6181 Chronic inflammatory demyelinating polyneuritis: Secondary | ICD-10-CM | POA: Insufficient documentation

## 2021-12-04 DIAGNOSIS — Z7952 Long term (current) use of systemic steroids: Secondary | ICD-10-CM | POA: Diagnosis not present

## 2021-12-04 LAB — CBC WITH DIFFERENTIAL/PLATELET
Abs Immature Granulocytes: 0.02 10*3/uL (ref 0.00–0.07)
Basophils Absolute: 0 10*3/uL (ref 0.0–0.1)
Basophils Relative: 1 %
Eosinophils Absolute: 0.2 10*3/uL (ref 0.0–0.5)
Eosinophils Relative: 4 %
HCT: 26.8 % — ABNORMAL LOW (ref 39.0–52.0)
Hemoglobin: 9 g/dL — ABNORMAL LOW (ref 13.0–17.0)
Immature Granulocytes: 1 %
Lymphocytes Relative: 72 %
Lymphs Abs: 2.8 10*3/uL (ref 0.7–4.0)
MCH: 40.7 pg — ABNORMAL HIGH (ref 26.0–34.0)
MCHC: 33.6 g/dL (ref 30.0–36.0)
MCV: 121.3 fL — ABNORMAL HIGH (ref 80.0–100.0)
Monocytes Absolute: 0.3 10*3/uL (ref 0.1–1.0)
Monocytes Relative: 7 %
Neutro Abs: 0.6 10*3/uL — ABNORMAL LOW (ref 1.7–7.7)
Neutrophils Relative %: 15 %
Platelets: 216 10*3/uL (ref 150–400)
RBC: 2.21 MIL/uL — ABNORMAL LOW (ref 4.22–5.81)
RDW: 17.4 % — ABNORMAL HIGH (ref 11.5–15.5)
Smear Review: NORMAL
WBC: 3.8 10*3/uL — ABNORMAL LOW (ref 4.0–10.5)
nRBC: 0 % (ref 0.0–0.2)

## 2021-12-04 LAB — COMPREHENSIVE METABOLIC PANEL
ALT: 42 U/L (ref 0–44)
AST: 29 U/L (ref 15–41)
Albumin: 3.6 g/dL (ref 3.5–5.0)
Alkaline Phosphatase: 161 U/L — ABNORMAL HIGH (ref 38–126)
Anion gap: 3 — ABNORMAL LOW (ref 5–15)
BUN: 22 mg/dL (ref 8–23)
CO2: 29 mmol/L (ref 22–32)
Calcium: 8.7 mg/dL — ABNORMAL LOW (ref 8.9–10.3)
Chloride: 108 mmol/L (ref 98–111)
Creatinine, Ser: 1.05 mg/dL (ref 0.61–1.24)
GFR, Estimated: >60 mL/min (ref >60)
Glucose, Bld: 94 mg/dL (ref 70–99)
Potassium: 3.7 mmol/L (ref 3.5–5.1)
Sodium: 140 mmol/L (ref 135–145)
Total Bilirubin: 1 mg/dL (ref 0.3–1.2)
Total Protein: 6.4 g/dL — ABNORMAL LOW (ref 6.5–8.1)

## 2021-12-04 MED ORDER — PREDNISONE 5 MG PO TABS: ORAL_TABLET | ORAL | 1 refills | Status: DC

## 2021-12-04 NOTE — Assessment & Plan Note (Signed)
Since his methotrexate dose was reduced, I noted slight lymphocytosis but the absolute lymphocyte count is still low He is quite anemic and I do not feel strongly that we should increase the dose of his methotrexate He will continue current dose of prednisone I will see him again in 6 months for further follow up

## 2021-12-04 NOTE — Assessment & Plan Note (Signed)
The patient is stable on current dose of prednisone His primary care doctor has increased the dose of gabapentin recently which I think is reasonable

## 2021-12-04 NOTE — Assessment & Plan Note (Signed)
He says been present for a long time Observe closely I will check vitamin B12 and iron studies in his next visit

## 2021-12-04 NOTE — Assessment & Plan Note (Signed)
His last MGUS panel was stable I plan to recheck it next year

## 2021-12-04 NOTE — Progress Notes (Signed)
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[facility]) Since his methotrexate dose was reduced, I noted slight lymphocytosis but the absolute lymphocyte count is still low He is quite anemic and I do not feel strongly that we should increase the dose of his methotrexate He will continue current dose of prednisone I will see him again in 6 months for further follow up  MGUS (monoclonal gammopathy of unknown significance) His last MGUS panel was stable I plan to recheck it next year  CIDP (chronic inflammatory demyelinating polyneuropathy) ([place]) The patient is stable on current dose of prednisone His primary care doctor has increased the dose of gabapentin recently which I think is reasonable  Macrocytic anemia He says been present for a long time Observe closely I will check vitamin B12 and iron studies in his next visit  Orders Placed This Encounter  Procedures   Kappa/lambda light chains    Standing Status:   Standing    Number of Occurrences:   22    Standing Expiration Date:   12/05/2022   Multiple Myeloma Panel (SPEP&IFE w/QIG)    Standing Status:   Standing    Number of Occurrences:   22    Standing Expiration Date:   12/05/2022   Iron and Iron Binding Capacity ([facility] only)    Standing Status:   Future    Standing Expiration Date:   12/05/2022   Ferritin    Standing Status:   Future    Standing Expiration Date:   12/04/2022   Vitamin B12    Standing Status:   Future    Standing Expiration Date:   12/05/2022    All questions were answered. The patient knows to call the clinic with any problems, questions or concerns. The total time spent in the appointment was 30 minutes encounter with patients including review of chart and various  tests results, discussions about plan of care and coordination of care plan   Heath Lark, MD 12/04/2021 11:06 AM  INTERVAL HISTORY: Please see below for problem oriented charting. he returns for treatment follow-up with his daughter He is now maintained on prednisone and methotrexate for LGL.  He is also being observed for MGUS There were no reported recent falls He is attempting to increase and improve his diet with higher protein intake We are not able to document his weight His primary care doctor has recently increased the dose of gabapentin due to slight worsening neuropathy Denies recent infection REVIEW OF SYSTEMS:   Constitutional: Denies fevers, chills or abnormal weight loss Eyes: Denies blurriness of vision Ears, nose, mouth, throat, and face: Denies mucositis or sore throat Respiratory: Denies cough, dyspnea or wheezes Cardiovascular: Denies palpitation, chest discomfort or lower extremity swelling Gastrointestinal:  Denies nausea, heartburn or change in bowel habits Skin: Denies abnormal skin rashes Lymphatics: Denies new lymphadenopathy or easy bruising Behavioral/Psych: Mood is stable, no new changes  All other systems were reviewed with the patient and are negative.  I have reviewed the past medical history, past surgical history, social history and family history with the patient and they are unchanged from previous note.  ALLERGIES:  is allergic to sulfa antibiotics and sulfonamide derivatives.  MEDICATIONS:  Current Outpatient Medications  Medication Sig Dispense Refill   acetaminophen (TYLENOL) 325 MG tablet Take 650  mg by mouth every 6 (six) hours as needed.     aspirin EC 81 MG tablet Take 162 mg by mouth daily.     CALCIUM CARB-CHOLECALCIFEROL PO Take 1,250 mg by mouth daily.     Cholecalciferol (VITAMIN D3) 25 MCG (1000 UT) CAPS Take 2 capsules by mouth daily.     Cyanocobalamin (VITAMIN B-12) 500 MCG SUBL Place 1 tablet under the tongue daily.       finasteride (PROSCAR) 5 MG tablet Take 1 tablet (5 mg total) by mouth daily. 90 tablet 3   folic acid (FOLVITE) 119 MCG tablet Take 400 mcg by mouth daily.     gabapentin (NEURONTIN) 300 MG capsule 62m AM and 6015mPM. 360 capsule 3   methotrexate (RHEUMATREX) 2.5 MG tablet Take 2 tablets (5 mg total) by mouth once a week. Caution:Chemotherapy. Protect from light. 12 tablet 11   Multiple Vitamin (MULTIVITAMIN) tablet Take 1 tablet by mouth daily.       predniSONE (DELTASONE) 5 MG tablet TAKE 1 TABLET BY MOUTH ON MONDAYS, WEDS, AND FRIDAYS, AND TAKE 2 TABS OTHER DAYS OF THE WEEK 90 tablet 1   timolol (TIMOPTIC) 0.5 % ophthalmic solution Place 1 drop into both eyes at bedtime. One drop in each eye at bedtime.     No current facility-administered medications for this visit.    SUMMARY OF ONCOLOGIC HISTORY: Oncology History  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[place] 12/11/2015 Bone Marrow Biopsy   Bone marrow biopsy confirmed diagnosis of Tcell LGL. T-cell receptor rearrangement study is still pending   12/19/2015 -  Chemotherapy   The patient was started on 10 mg once a week methotrexate. The dose is increased to 12.5 mg starting 01/16/16      PHYSICAL EXAMINATION: ECOG PERFORMANCE STATUS: 2 - Symptomatic, <50% confined to bed  Vitals:   12/04/21 1015  BP: (!) 108/53  Pulse: (!) 55  Resp: 18  Temp: (!) 97.4 F (36.3 C)  SpO2: 100%   There were no vitals filed for this visit.  GENERAL:alert, no distress and comfortable.  He is sitting on the wheelchair.  Noted reduced muscle mass throughout NEURO: alert & oriented x 3 with fluent speech, no focal motor/sensory deficits  LABORATORY DATA:  I have reviewed the data as listed    Component Value Date/Time   NA 140 12/04/2021 0940   NA 138 12/15/2016 1319   K 3.7 12/04/2021 0940   K 4.1 12/15/2016 1319   CL 108 12/04/2021 0940   CO2 29 12/04/2021 0940   CO2 24 12/15/2016 1319   GLUCOSE 94 12/04/2021 0940   GLUCOSE 123  12/15/2016 1319   BUN 22 12/04/2021 0940   BUN 13.7 12/15/2016 1319   CREATININE 1.05 12/04/2021 0940   CREATININE 1.2 12/15/2016 1319   CALCIUM 8.7 (L) 12/04/2021 0940   CALCIUM 8.8 12/15/2016 1319   PROT 6.4 (L) 12/04/2021 0940   PROT 6.5 12/15/2016 1319   PROT 6.2 12/15/2016 1319   ALBUMIN 3.6 12/04/2021 0940   ALBUMIN 3.5 12/15/2016 1319   AST 29 12/04/2021 0940   AST 66 (H) 12/15/2016 1319   ALT 42 12/04/2021 0940   ALT 90 (H) 12/15/2016 1319   ALKPHOS 161 (H) 12/04/2021 0940   ALKPHOS 116 12/15/2016 1319   BILITOT 1.0 12/04/2021 0940   BILITOT 1.13 12/15/2016 1319   GFRNONAA >60 12/04/2021 0940   GFRAA >60 06/27/2019 0857    No results found for: "SPEP", "UPEP"  Lab Results  Component Value Date  WBC 3.8 (L) 12/04/2021   NEUTROABS 0.6 (L) 12/04/2021   HGB 9.0 (L) 12/04/2021   HCT 26.8 (L) 12/04/2021   MCV 121.3 (H) 12/04/2021   PLT 216 12/04/2021      Chemistry      Component Value Date/Time   NA 140 12/04/2021 0940   NA 138 12/15/2016 1319   K 3.7 12/04/2021 0940   K 4.1 12/15/2016 1319   CL 108 12/04/2021 0940   CO2 29 12/04/2021 0940   CO2 24 12/15/2016 1319   BUN 22 12/04/2021 0940   BUN 13.7 12/15/2016 1319   CREATININE 1.05 12/04/2021 0940   CREATININE 1.2 12/15/2016 1319      Component Value Date/Time   CALCIUM 8.7 (L) 12/04/2021 0940   CALCIUM 8.8 12/15/2016 1319   ALKPHOS 161 (H) 12/04/2021 0940   ALKPHOS 116 12/15/2016 1319   AST 29 12/04/2021 0940   AST 66 (H) 12/15/2016 1319   ALT 42 12/04/2021 0940   ALT 90 (H) 12/15/2016 1319   BILITOT 1.0 12/04/2021 0940   BILITOT 1.13 12/15/2016 1319

## 2021-12-15 ENCOUNTER — Telehealth: Payer: Self-pay | Admitting: Family Medicine

## 2021-12-15 NOTE — Telephone Encounter (Signed)
Will call back to schedule AWV with [facility]

## 2022-02-25 IMAGING — CR DG FOOT COMPLETE 3+V*L*
1 series · 3 of 3 positions shown · non-contrast
Comparison: None.

CLINICAL DATA: Nonhealing wound of left lower extremity. Order
comments states nonhealing wound left lateral ankle muscle. Tendon
exposed.

EXAM:
LEFT FOOT - COMPLETE 3+ VIEW

[Series 1: dg foot complete left · 0.14mm/px · 3 of 3 slices shown]
[im 1/3]
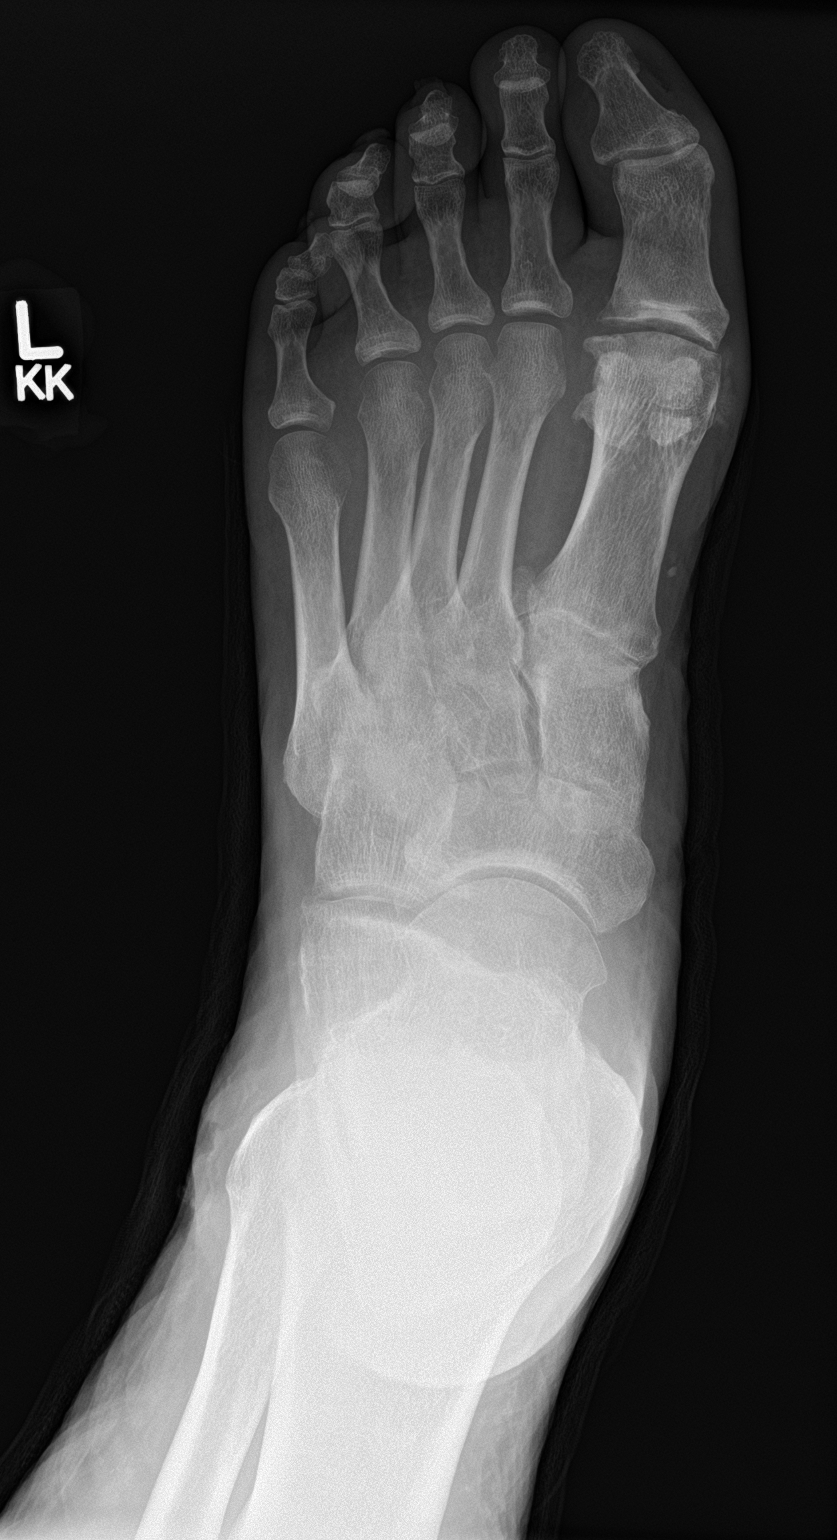
[im 2/3]
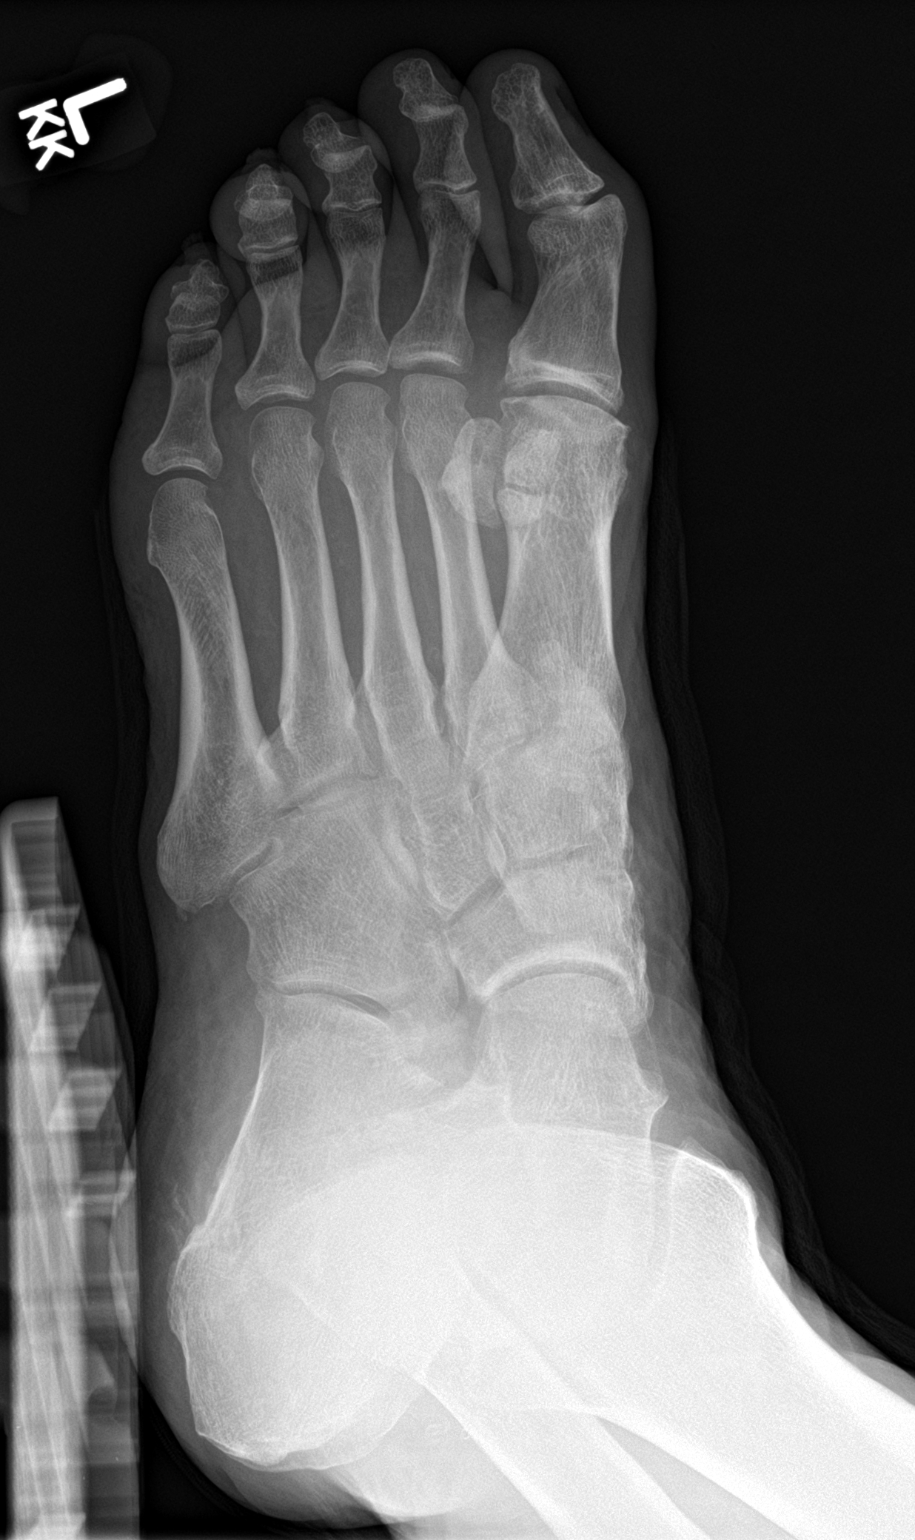
[im 3/3]
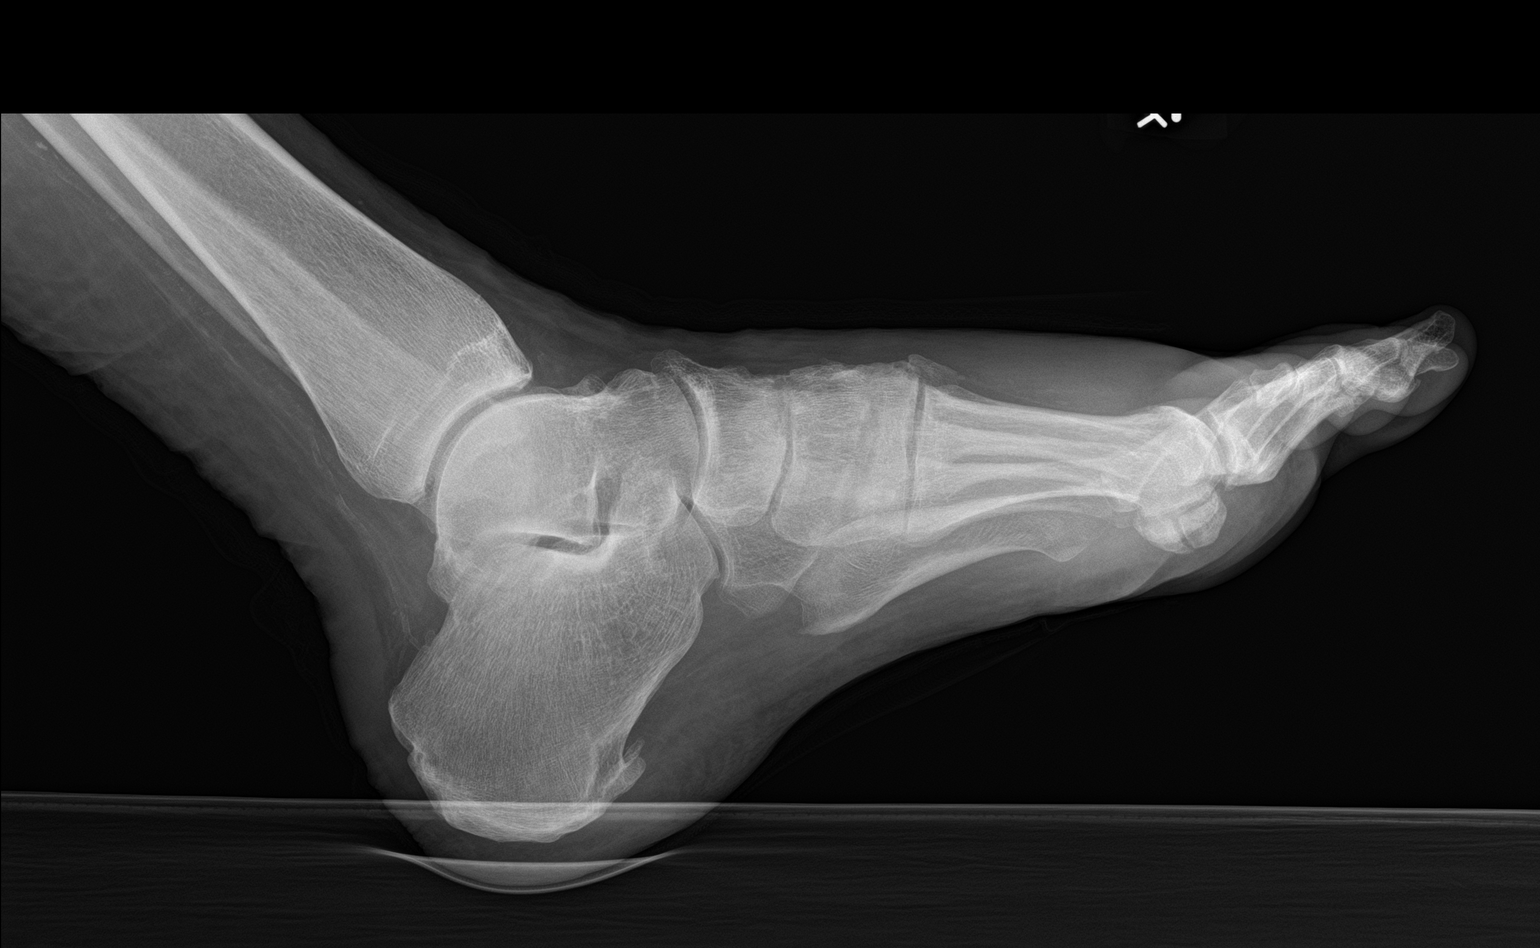

[3 of 3 positions shown; findings below may reference images not displayed]

FINDINGS: No erosion, periosteal reaction, or bony destructive change.
Osteoarthritis of the first metatarsal phalangeal joint and in the
midfoot. Moderate plantar calcaneal spur. There is mild skin and
soft tissue irregularity about the lateral ankle, incompletely
assessed on this foot exam. No radiopaque foreign body or soft
tissue gas. Vascular calcifications are seen. There is dorsal soft
tissue edema overlying the forefoot.
IMPRESSION: 1. Soft tissue irregularity about the lateral ankle, may represent
site of wound, incompletely assessed on this foot exam. No
radiographic evidence of osteomyelitis.
2. Osteoarthritis of the first metatarsophalangeal joint and
midfoot.
3. Moderate plantar calcaneal spur.

## 2022-04-01 ENCOUNTER — Ambulatory Visit (INDEPENDENT_AMBULATORY_CARE_PROVIDER_SITE_OTHER): Payer: Medicare PPO

## 2022-04-01 VITALS — Ht 76.0 in | Wt 190.0 lb

## 2022-04-01 DIAGNOSIS — Z0000 Encounter for general adult medical examination without abnormal findings: Secondary | ICD-10-CM

## 2022-04-01 NOTE — Patient Instructions (Signed)
[name] , Thank you for taking time to come for your Medicare Wellness Visit. I appreciate your ongoing commitment to your health goals. Please review the following plan we discussed and let me know if I can assist you in the future.   These are the goals we discussed:  Goals      Increase physical activity     Starting 10/21/2017 , I will continue to exercise at least 30 minutes daily.         This is a list of the screening recommended for you and due dates:  Health Maintenance  Topic Date Due   Zoster (Shingles) Vaccine (1 of 2) Never done   Flu Shot  09/02/2021   COVID-19 Vaccine (4 - 2023-24 season) 10/03/2021   Medicare Annual Wellness Visit  04/02/2023   DTaP/Tdap/Td vaccine (4 - Td or Tdap) 11/08/2028   Pneumonia Vaccine  Completed   HPV Vaccine  Aged Out    Advanced directives: yes  Conditions/risks identified: falls risk  Next appointment: Follow up in one year for your annual wellness visit. 04/07/2023 '@11am'$  telephone  Preventive Care 65 Years and Older, Male  Preventive care refers to lifestyle choices and visits with your health care provider that can promote health and wellness. What does preventive care include? A yearly physical exam. This is also called an annual well check. Dental exams once or twice a year. Routine eye exams. Ask your health care provider how often you should have your eyes checked. Personal lifestyle choices, including: Daily care of your teeth and gums. Regular physical activity. Eating a healthy diet. Avoiding tobacco and drug use. Limiting alcohol use. Practicing safe sex. Taking low doses of aspirin every day. Taking vitamin and mineral supplements as recommended by your health care provider. What happens during an annual well check? The services and screenings done by your health care provider during your annual well check will depend on your age, overall health, lifestyle risk factors, and family history of  disease. Counseling  Your health care provider may ask you questions about your: Alcohol use. Tobacco use. Drug use. Emotional well-being. Home and relationship well-being. Sexual activity. Eating habits. History of falls. Memory and ability to understand (cognition). Work and work Statistician. Screening  You may have the following tests or measurements: Height, weight, and BMI. Blood pressure. Lipid and cholesterol levels. These may be checked every 5 years, or more frequently if you are over 6 years old. Skin check. Lung cancer screening. You may have this screening every year starting at age 44 if you have a 30-pack-year history of smoking and currently smoke or have quit within the past 15 years. Fecal occult blood test (FOBT) of the stool. You may have this test every year starting at age 53. Flexible sigmoidoscopy or colonoscopy. You may have a sigmoidoscopy every 5 years or a colonoscopy every 10 years starting at age 45. Prostate cancer screening. Recommendations will vary depending on your family history and other risks. Hepatitis C blood test. Hepatitis B blood test. Sexually transmitted disease (STD) testing. Diabetes screening. This is done by checking your blood sugar (glucose) after you have not eaten for a while (fasting). You may have this done every 1-3 years. Abdominal aortic aneurysm (AAA) screening. You may need this if you are a current or former smoker. Osteoporosis. You may be screened starting at age 81 if you are at high risk. Talk with your health care provider about your test results, treatment options, and if necessary, the  need for more tests. Vaccines  Your health care provider may recommend certain vaccines, such as: Influenza vaccine. This is recommended every year. Tetanus, diphtheria, and acellular pertussis (Tdap, Td) vaccine. You may need a Td booster every 10 years. Zoster vaccine. You may need this after age 68. Pneumococcal 13-valent  conjugate (PCV13) vaccine. One dose is recommended after age 34. Pneumococcal polysaccharide (PPSV23) vaccine. One dose is recommended after age 23. Talk to your health care provider about which screenings and vaccines you need and how often you need them. This information is not intended to replace advice given to you by your health care provider. Make sure you discuss any questions you have with your health care provider. Document Released: 02/15/2015 Document Revised: 10/09/2015 Document Reviewed: 11/20/2014 Elsevier Interactive Patient Education  2017 [place] Prevention in the Home Falls can cause injuries. They can happen to people of all ages. There are many things you can do to make your home safe and to help prevent falls. What can I do on the outside of my home? Regularly fix the edges of walkways and driveways and fix any cracks. Remove anything that might make you trip as you walk through a door, such as a raised step or threshold. Trim any bushes or trees on the path to your home. Use bright outdoor lighting. Clear any walking paths of anything that might make someone trip, such as rocks or tools. Regularly check to see if handrails are loose or broken. Make sure that both sides of any steps have handrails. Any raised decks and porches should have guardrails on the edges. Have any leaves, snow, or ice cleared regularly. Use sand or salt on walking paths during winter. Clean up any spills in your garage right away. This includes oil or grease spills. What can I do in the bathroom? Use night lights. Install grab bars by the toilet and in the tub and shower. Do not use towel bars as grab bars. Use non-skid mats or decals in the tub or shower. If you need to sit down in the shower, use a plastic, non-slip stool. Keep the floor dry. Clean up any water that spills on the floor as soon as it happens. Remove soap buildup in the tub or shower regularly. Attach bath mats  securely with double-sided non-slip rug tape. Do not have throw rugs and other things on the floor that can make you trip. What can I do in the bedroom? Use night lights. Make sure that you have a light by your bed that is easy to reach. Do not use any sheets or blankets that are too big for your bed. They should not hang down onto the floor. Have a firm chair that has side arms. You can use this for support while you get dressed. Do not have throw rugs and other things on the floor that can make you trip. What can I do in the kitchen? Clean up any spills right away. Avoid walking on wet floors. Keep items that you use a lot in easy-to-reach places. If you need to reach something above you, use a strong step stool that has a grab bar. Keep electrical cords out of the way. Do not use floor polish or wax that makes floors slippery. If you must use wax, use non-skid floor wax. Do not have throw rugs and other things on the floor that can make you trip. What can I do with my stairs? Do not leave any items on the  stairs. Make sure that there are handrails on both sides of the stairs and use them. Fix handrails that are broken or loose. Make sure that handrails are as long as the stairways. Check any carpeting to make sure that it is firmly attached to the stairs. Fix any carpet that is loose or worn. Avoid having throw rugs at the top or bottom of the stairs. If you do have throw rugs, attach them to the floor with carpet tape. Make sure that you have a light switch at the top of the stairs and the bottom of the stairs. If you do not have them, ask someone to add them for you. What else can I do to help prevent falls? Wear shoes that: Do not have high heels. Have rubber bottoms. Are comfortable and fit you well. Are closed at the toe. Do not wear sandals. If you use a stepladder: Make sure that it is fully opened. Do not climb a closed stepladder. Make sure that both sides of the stepladder  are locked into place. Ask someone to hold it for you, if possible. Clearly mark and make sure that you can see: Any grab bars or handrails. First and last steps. Where the edge of each step is. Use tools that help you move around (mobility aids) if they are needed. These include: Canes. Walkers. Scooters. Crutches. Turn on the lights when you go into a dark area. Replace any light bulbs as soon as they burn out. Set up your furniture so you have a clear path. Avoid moving your furniture around. If any of your floors are uneven, fix them. If there are any pets around you, be aware of where they are. Review your medicines with your doctor. Some medicines can make you feel dizzy. This can increase your chance of falling. Ask your doctor what other things that you can do to help prevent falls. This information is not intended to replace advice given to you by your health care provider. Make sure you discuss any questions you have with your health care provider. Document Released: 11/15/2008 Document Revised: 06/27/2015 Document Reviewed: 02/23/2014 Elsevier Interactive Patient Education  2017 [employer].

## 2022-04-01 NOTE — Progress Notes (Signed)
I connected with  [name] and his daughter [name] on 04/01/22 by a audio Abby [place] telephone and verified that I am speaking with the correct person using two identifiers.  Patient Location: Home  Provider Location: Office/Clinic  I discussed the limitations of evaluation and management by telemedicine. The patient expressed understanding and agreed to proceed.  Subjective:   [name] is a 82 y.o. male who presents for Medicare Annual/Subsequent preventive examination.  Review of Systems     Cardiac Risk Factors include: advanced age (>16mn, >>66women);hypertension;male gender;sedentary lifestyle     Objective:    Today's Vitals   04/01/22 1029 04/01/22 1031  Weight: 190 lb (86.2 kg)   Height: '6\' 4"'$  (1.93 m)   PainSc:  4    Body mass index is 23.13 kg/m.     04/01/2022   10:43 AM 11/09/2018    5:39 PM 04/12/2018   12:31 PM 03/09/2018   11:44 AM 10/24/2017    1:54 PM 10/14/2016   11:12 AM 03/12/2016    1:59 PM  Advanced Directives  Does Patient Have a Medical Advance Directive? Yes No;Yes Yes Yes No No No  Type of AParamedicof APalm ValleyLiving will HPenelopeLiving will [place]iptonLiving will H[place]Living will     Does patient want to make changes to medical advance directive?   No - Patient declined No - Patient declined  Yes (MAU/Ambulatory/Procedural Areas - Information given)   Copy of [place]in Chart? No - copy requested No - copy requested No - copy requested No - copy requested     Would patient like information on creating a medical advance directive?     No - Patient declined      Current Medications (verified) Outpatient Encounter Medications as of 04/01/2022  Medication Sig   acetaminophen (TYLENOL) 325 MG tablet Take 650 mg by mouth every 6 (six) hours as needed.   CALCIUM CARB-CHOLECALCIFEROL PO Take 1,250 mg by mouth daily.    Cholecalciferol (VITAMIN D3) 25 MCG (1000 UT) CAPS Take 2 capsules by mouth daily.   Cyanocobalamin (VITAMIN B-12) 500 MCG SUBL Place 1 tablet under the tongue daily.    finasteride (PROSCAR) 5 MG tablet Take 1 tablet (5 mg total) by mouth daily.   folic acid (FOLVITE) 4A999333MCG tablet Take 400 mcg by mouth daily.   gabapentin (NEURONTIN) 300 MG capsule '600mg'$  AM and '600mg'$  PM.   methotrexate (RHEUMATREX) 2.5 MG tablet Take 2 tablets (5 mg total) by mouth once a week. Caution:Chemotherapy. Protect from light.   Multiple Vitamin (MULTIVITAMIN) tablet Take 1 tablet by mouth daily.     predniSONE (DELTASONE) 5 MG tablet TAKE 1 TABLET BY MOUTH ON MONDAYS, WEDS, AND FRIDAYS, AND TAKE 2 TABS OTHER DAYS OF THE WEEK   timolol (TIMOPTIC) 0.5 % ophthalmic solution Place 1 drop into both eyes at bedtime. One drop in each eye at bedtime.   aspirin EC 81 MG tablet Take 162 mg by mouth daily. (Patient not taking: Reported on 04/01/2022)   No facility-administered encounter medications on file as of 04/01/2022.    Allergies (verified) Sulfa antibiotics and Sulfonamide derivatives   History: Past Medical History:  Diagnosis Date   Basal cell carcinoma of skin    skin cancer, resected by derm (Dr. DEvorn Gong   Cholelithiasis 01/95   via abdominal ultrasound   Chronic inflammatory demyelinating polyneuropathy ([place] 06/05/2010   Diverticulosis of colon 01/95  History of kidney stones    Hyperglycemia    Related to chronic steroid use   Hypertension 08/07   Leukemia ([place])    Nephrolithiasis 1993   X 2   Polyneuropathy     Chronic inflammatory demylenating - Prednisone per [facility]   Post herpetic neuralgia    forehead, right side of face   Syncope 08/05/07   Presumed hypotension   Past Surgical History:  Procedure Laterality Date   CATARACT EXTRACTION W/PHACO Right 03/09/2018   Procedure: CATARACT EXTRACTION PHACO AND INTRAOCULAR LENS PLACEMENT ([place])  RIGHT;  Surgeon: Leandrew Koyanagi, MD;  Location:  [facility];  Service: Ophthalmology;  Laterality: Right;   CATARACT EXTRACTION W/PHACO Left 04/12/2018   Procedure: CATARACT EXTRACTION PHACO AND INTRAOCULAR LENS PLACEMENT ([place])  LEFT;  Surgeon: Leandrew Koyanagi, MD;  Location: [facility];  Service: Ophthalmology;  Laterality: Left;   CHOLECYSTECTOMY  10/27/99   [facility] Done)   DOPPLER ECHOCARDIOGRAPHY  08/01   T.R.; T.R.; P/R. borderline LVH   ESOPHAGOGASTRODUODENOSCOPY  12/19/05   gastritis; H.H.   HERNIA REPAIR  06/17/04   Laparoscopic, bilateral   LUMBAR [place]  2000   L3/4 rupture   MOHS SURGERY Left 01/09/2016   left side of nose   nephrolithiasis     Family History  Problem Relation Age of Onset   Hypertension Mother    Heart disease Mother        MI   Kidney disease Father        Kidney failure   Parkinsonism Father    Cancer Sister        Acute leukemia   Arthritis Sister    Cancer Sister 32       ovarian ca   Cancer Brother        liver cancer   Diabetes Neg Hx    Stroke Neg Hx    Prostate cancer Neg Hx    Colon cancer Neg Hx    Social History   Socioeconomic History   Marital status: Married    Spouse name: Not on file   Number of children: 2   Years of education: Not on file   Highest education level: Not on file  Occupational History   Occupation: Retired from [employer] in 1998   Occupation: Taught private driver's education, 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,   Occupation: Coached  Tobacco Use   Smoking status: Never   Smokeless tobacco: Never  Vaping Use   Vaping Use: Never used  Substance and Sexual Activity   Alcohol use: No   Drug use: No   Sexual activity: Never  Other Topics Concern   Not on file  Social History Narrative   Former Pharmacist, hospital, coach   Married 1963   2 kids   Social Determinants of Health   Financial Resource Strain: Low Risk  (04/01/2022)   Overall Financial Resource Strain (CARDIA)    Difficulty of Paying Living Expenses:  Not hard at all  Food Insecurity: No Food Insecurity (04/01/2022)   Hunger Vital Sign    Worried About Running Out of Food in the Last Year: Never true    Ran Out of Food in the Last Year: Never true  Transportation Needs: No Transportation Needs (04/01/2022)   PRAPARE - Hydrologist (Medical): No    Lack of Transportation (Non-Medical): No  Physical Activity: Inactive (04/01/2022)   Exercise Vital Sign    Days of Exercise per  Week: 0 days    Minutes of Exercise per Session: 0 min  Stress: No Stress Concern Present (04/01/2022)   [place]    Feeling of Stress : Not at all  Social Connections: Moderately Isolated (04/01/2022)   Social Connection and Isolation Panel [NHANES]    Frequency of Communication with Friends and Family: More than three times a week    Frequency of Social Gatherings with Friends and Family: Never    Attends Religious Services: Never    Marine scientist or Organizations: No    Attends Music therapist: Never    Marital Status: Married    Tobacco Counseling Counseling given: Not Answered   Clinical Intake:  Pre-visit preparation completed: Yes  Pain : 0-10 Pain Score: 4  Pain Type: Chronic pain Pain Location: Head Pain Descriptors / Indicators: Aching Pain Onset: More than a month ago Pain Frequency: Intermittent Pain Relieving Factors: medication (gabapentin)  Pain Relieving Factors: medication (gabapentin)  BMI - recorded: 23.13 Nutritional Status: BMI of 19-24  Normal Nutritional Risks: None Diabetes: No  How often do you need to have someone help you when you read instructions, pamphlets, or other written materials from your doctor or pharmacy?: 1 - Never  Diabetic?no  Interpreter Needed?: No  Information entered by :: B.Monnica Saltsman,LPN   Activities of Daily Living    04/01/2022   10:52 AM  In your present state of health, do  you have any difficulty performing the following activities:  Hearing? 0  Vision? 0  Difficulty concentrating or making decisions? 0  Walking or climbing stairs? 1  Dressing or bathing? 1  Doing errands, shopping? 1  Preparing Food and eating ? Y  Using the Toilet? Y  In the past six months, have you accidently leaked urine? Y  Do you have problems with loss of bowel control? N  Managing your Medications? Y  Managing your Finances? Y  Housekeeping or managing your Housekeeping? Y    Patient Care Team: Tonia Ghent, MD as PCP - General (Family Medicine) Estill Cotta, MD as Referring Physician (Ophthalmology) Vern Claude, [place] as Social Worker  Indicate any recent Medical Services you may have received from other than [facility] providers in the past year (date may be approximate).     Assessment:   This is a routine wellness examination for [name].  Hearing/Vision screen Hearing Screening - Comments:: Adequate hearing Vision Screening - Comments:: Adequate vision [facility]  Dietary issues and exercise activities discussed: Current Exercise Habits: The patient does not participate in regular exercise at present, Exercise limited by: neurologic condition(s)   Goals Addressed             This Visit's Progress    Increase physical activity   Not on track    Starting 10/21/2017 , I will continue to exercise at least 30 minutes daily.        Depression Screen    04/01/2022   10:39 AM 12/24/2020    2:25 PM 12/22/2018    8:33 AM 10/24/2017    1:53 PM 10/14/2016   11:00 AM 09/24/2015    9:38 AM 08/16/2014    9:50 AM  PHQ 2/9 Scores  PHQ - 2 Score 0 0 0 0 1 0 0  PHQ- 9 Score    0 1      Fall Risk    04/01/2022   10:51 AM 12/22/2018    8:33 AM 10/24/2017  1:53 PM 10/14/2016   11:00 AM 09/24/2015    9:38 AM  Fall Risk   Falls in the past year? 0 1 Yes No Yes  Comment   lost balance  Pt's knees buckled and he fell to knees on ground  Number falls in past  yr: 0 0 2 or more  1  Injury with Fall? 0  No  No  Risk Factor Category    High Fall Risk    Risk for fall due to : No Fall Risks  Impaired balance/gait;Impaired mobility;Impaired vision;History of fall(s)  Impaired balance/gait;Impaired mobility;Impaired vision  Follow up Falls prevention discussed;Education provided    Falls evaluation completed    FALL RISK PREVENTION PERTAINING TO THE HOME:  Any stairs in or around the home? Yes outside home If so, are there any without handrails? Yes  Home free of loose throw rugs in walkways, pet beds, electrical cords, etc? Yes  Adequate lighting in your home to reduce risk of falls? Yes   ASSISTIVE DEVICES UTILIZED TO PREVENT FALLS:  Life alert? No cellphone with them at all times Use of a cane, walker or w/c? Yes rollater Grab bars in the bathroom? Yes  Shower chair or bench in shower? Yes  Elevated toilet seat or a handicapped toilet? Yes     Cognitive Function:    10/24/2017    1:54 PM 10/14/2016   11:10 AM 09/24/2015    9:40 AM  MMSE - Mini Mental State Exam  Orientation to time '5 5 5  '$ Orientation to Place '5 5 5  '$ Registration '3 3 3  '$ Attention/ Calculation 0 0 0  Recall '3 3 3  '$ Language- name 2 objects 0 0 0  Language- repeat '1 1 1  '$ Language- follow 3 step command '3 3 3  '$ Language- read & follow direction 0 0 0  Write a sentence 0 0 0  Copy design 0 0 0  Total score '20 20 20        '$ 04/01/2022   10:46 AM  6CIT Screen  What Year? 0 points  What month? 0 points  What time? 0 points  Count back from 20 0 points  Months in reverse 0 points  Repeat phrase 0 points  Total Score 0 points    Immunizations Immunization History  Administered Date(s) Administered   Fluad Quad(high Dose 65+) 12/22/2018, 11/27/2019   Influenza Split 11/11/2010, 12/17/2011   Influenza Whole 12/10/2006, 11/07/2007, 11/22/2008, 11/04/2009   Influenza, High Dose Seasonal PF 12/03/2020   Influenza,inj,Quad PF,6+ Mos 12/16/2012, 11/15/2015,  11/02/2016, 10/25/2017   Influenza-Unspecified 12/01/2013, 12/03/2014   Moderna Sars-Covid-2 Vaccination 07/31/2019, 08/28/2019, 10/02/2019   Pneumococcal Conjugate-13 09/24/2015   Pneumococcal Polysaccharide-23 04/08/2006, 12/24/2020   Td 11/28/2001, 11/09/2018   Tdap 11/11/2010    TDAP status: Up to date  Flu Vaccine status: Up to date  Pneumococcal vaccine status: Up to date  Covid-19 vaccine status: Completed vaccines  Qualifies for Shingles Vaccine? Yes   Zostavax completed No   Shingrix Completed?: No.    Education has been provided regarding the importance of this vaccine. Patient has been advised to call insurance company to determine out of pocket expense if they have not yet received this vaccine. Advised may also receive vaccine at local pharmacy or Health Dept. Verbalized acceptance and understanding.  Screening Tests Health Maintenance  Topic Date Due   Zoster Vaccines- Shingrix (1 of 2) Never done   INFLUENZA VACCINE  09/02/2021   COVID-19 Vaccine (4 - 2023-24 season) 10/03/2021  Medicare Annual Wellness (AWV)  04/02/2023   DTaP/Tdap/Td (4 - Td or Tdap) 11/08/2028   Pneumonia Vaccine 16+ Years old  Completed   HPV VACCINES  Aged Out    Health Maintenance  Health Maintenance Due  Topic Date Due   Zoster Vaccines- Shingrix (1 of 2) Never done   INFLUENZA VACCINE  09/02/2021   COVID-19 Vaccine (4 - 2023-24 season) 10/03/2021    Colorectal cancer screening: No longer required.   Lung Cancer Screening: (Low Dose CT Chest recommended if Age 20-80 years, 30 pack-year currently smoking OR have quit w/in 15years.) does not qualify.   Lung Cancer Screening Referral: no  Additional Screening:  Hepatitis C Screening: does not qualify; Completed no  Vision Screening: Recommended annual ophthalmology exams for early detection of glaucoma and other disorders of the eye. Is the patient up to date with their annual eye exam?  Yes  Who is the provider or what is the  name of the office in which the patient attends annual eye exams? [facility] If pt is not established with a provider, would they like to be referred to a provider to establish care? No .   Dental Screening: Recommended annual dental exams for proper oral hygiene  Community Resource Referral / Chronic Care Management: CRR required this visit?  No   CCM required this visit?  No      Plan:     I have personally reviewed and noted the following in the patient's chart:   Medical and social history Use of alcohol, tobacco or illicit drugs  Current medications and supplements including opioid prescriptions. Patient is not currently taking opioid prescriptions. Functional ability and status Nutritional status Physical activity Advanced directives List of other physicians Hospitalizations, surgeries, and ER visits in previous 12 months Vitals Screenings to include cognitive, depression, and falls Referrals and appointments  In addition, I have reviewed and discussed with patient certain preventive protocols, quality metrics, and best practice recommendations. A written personalized care plan for preventive services as well as general preventive health recommendations were provided to patient.     Roger Shelter, LPN   [id number]   Nurse Notes: pt relays he is doing alright but continues to manage chronic headaches and losing muscle control. He is unable to stand on his on or for any period of time. Pt uses rollater for ambulation and transfer. Pt and wife are "home-bound" per daughter for 4 years now and neither are able to drive now. Their son and daughter care for them and help manage their care. Daughter relays accommodations made to assist pt in ADLs (liftchair, walk in shower w/seat,etc). Encouraged to reach out to [place] if needed and for any questions or concerns as none at this time.

## 2022-05-11 ENCOUNTER — Telehealth: Payer: Self-pay | Admitting: Hematology and Oncology

## 2022-05-11 NOTE — Telephone Encounter (Signed)
Spoke with patient daughter rescheduling appointments

## 2022-06-09 ENCOUNTER — Other Ambulatory Visit: Payer: Self-pay | Admitting: Hematology and Oncology

## 2022-06-09 ENCOUNTER — Ambulatory Visit: Payer: Medicare PPO | Admitting: Hematology and Oncology

## 2022-06-09 ENCOUNTER — Other Ambulatory Visit: Payer: Medicare PPO

## 2022-06-18 ENCOUNTER — Other Ambulatory Visit: Payer: Self-pay

## 2022-06-18 ENCOUNTER — Inpatient Hospital Stay: Payer: Medicare PPO | Attending: Hematology and Oncology

## 2022-06-18 ENCOUNTER — Inpatient Hospital Stay: Payer: Medicare PPO | Admitting: Hematology and Oncology

## 2022-06-18 ENCOUNTER — Encounter: Payer: Self-pay | Admitting: Hematology and Oncology

## 2022-06-18 VITALS — BP 96/54 | HR 65 | Temp 97.8°F | Resp 18 | Ht 76.0 in | Wt 200.0 lb

## 2022-06-18 DIAGNOSIS — D539 Nutritional anemia, unspecified: Secondary | ICD-10-CM | POA: Diagnosis not present

## 2022-06-18 DIAGNOSIS — S7011XA Contusion of right thigh, initial encounter: Secondary | ICD-10-CM | POA: Insufficient documentation

## 2022-06-18 DIAGNOSIS — W19XXXA Unspecified fall, initial encounter: Secondary | ICD-10-CM | POA: Diagnosis not present

## 2022-06-18 DIAGNOSIS — D472 Monoclonal gammopathy: Secondary | ICD-10-CM | POA: Diagnosis not present

## 2022-06-18 DIAGNOSIS — C91Z0 Other lymphoid leukemia not having achieved remission: Secondary | ICD-10-CM

## 2022-06-18 DIAGNOSIS — I959 Hypotension, unspecified: Secondary | ICD-10-CM | POA: Insufficient documentation

## 2022-06-18 LAB — CBC WITH DIFFERENTIAL/PLATELET
Abs Immature Granulocytes: 0.02 10*3/uL (ref 0.00–0.07)
Basophils Absolute: 0 10*3/uL (ref 0.0–0.1)
Basophils Relative: 0 %
Eosinophils Absolute: 0.2 10*3/uL (ref 0.0–0.5)
Eosinophils Relative: 3 %
HCT: 24.4 % — ABNORMAL LOW (ref 39.0–52.0)
Hemoglobin: 8 g/dL — ABNORMAL LOW (ref 13.0–17.0)
Immature Granulocytes: 0 %
Lymphocytes Relative: 82 %
Lymphs Abs: 3.8 10*3/uL (ref 0.7–4.0)
MCH: 41.7 pg — ABNORMAL HIGH (ref 26.0–34.0)
MCHC: 32.8 g/dL (ref 30.0–36.0)
MCV: 127.1 fL — ABNORMAL HIGH (ref 80.0–100.0)
Monocytes Absolute: 0.2 10*3/uL (ref 0.1–1.0)
Monocytes Relative: 4 %
Neutro Abs: 0.5 10*3/uL — ABNORMAL LOW (ref 1.7–7.7)
Neutrophils Relative %: 11 %
Platelets: 215 10*3/uL (ref 150–400)
RBC: 1.92 MIL/uL — ABNORMAL LOW (ref 4.22–5.81)
RDW: 18.1 % — ABNORMAL HIGH (ref 11.5–15.5)
Smear Review: NORMAL
WBC: 4.7 10*3/uL (ref 4.0–10.5)
nRBC: 0 % (ref 0.0–0.2)

## 2022-06-18 LAB — COMPREHENSIVE METABOLIC PANEL
ALT: 41 U/L (ref 0–44)
AST: 31 U/L (ref 15–41)
Albumin: 3.6 g/dL (ref 3.5–5.0)
Alkaline Phosphatase: 116 U/L (ref 38–126)
Anion gap: 5 (ref 5–15)
BUN: 20 mg/dL (ref 8–23)
CO2: 28 mmol/L (ref 22–32)
Calcium: 8.6 mg/dL — ABNORMAL LOW (ref 8.9–10.3)
Chloride: 107 mmol/L (ref 98–111)
Creatinine, Ser: 1.05 mg/dL (ref 0.61–1.24)
GFR, Estimated: >60 mL/min (ref >60)
Glucose, Bld: 96 mg/dL (ref 70–99)
Potassium: 3.9 mmol/L (ref 3.5–5.1)
Sodium: 140 mmol/L (ref 135–145)
Total Bilirubin: 1 mg/dL (ref 0.3–1.2)
Total Protein: 6.3 g/dL — ABNORMAL LOW (ref 6.5–8.1)

## 2022-06-18 LAB — FERRITIN: Ferritin: 134 ng/mL (ref 24–336)

## 2022-06-18 LAB — VITAMIN B12: Vitamin B-12: 561 pg/mL (ref 180–914)

## 2022-06-18 LAB — IRON AND IRON BINDING CAPACITY (CC-WL,HP ONLY)
Iron: 125 ug/dL (ref 45–182)
Saturation Ratios: 57 % — ABNORMAL HIGH (ref 17.9–39.5)
TIBC: 218 ug/dL — ABNORMAL LOW (ref 250–450)
UIBC: 93 ug/dL — ABNORMAL LOW (ref 117–376)

## 2022-06-18 NOTE — Assessment & Plan Note (Signed)
Myeloma panel is pending I am concerned about his progressive anemia He might need repeat bone marrow aspirate and biopsy I will call his daughter with results next week

## 2022-06-18 NOTE — Progress Notes (Signed)
[facility] OFFICE PROGRESS NOTE  Patient Care Team: Joaquim Nam, MD as PCP - General (Family Medicine) Dingeldein, Viviann Spare, MD as Referring Physician (Ophthalmology) Wenda Overland, [place] as Social Worker  ASSESSMENT & PLAN:  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HCC) Since his methotrexate dose was reduced, I noted slight lymphocytosis but the absolute lymphocyte count is still low He is quite anemic I am concerned about his severe anemia Further tests are pending He will hold methotrexate for now  Macrocytic anemia This is multifactorial Iron studies and B12 are pending The patient is instructed to hold methotrexate He does not need blood transfusion right now It could be related to worsening bone marrow disease or progression of multiple myeloma I will call his daughter with test results He might need repeat bone marrow aspirate and biopsy for evaluation  MGUS (monoclonal gammopathy of unknown significance) Myeloma panel is pending I am concerned about his progressive anemia He might need repeat bone marrow aspirate and biopsy I will call his daughter with results next week  No orders of the defined types were placed in this encounter.   All questions were answered. The patient knows to call the clinic with any problems, questions or concerns. The total time spent in the appointment was 30 minutes encounter with patients including review of chart and various tests results, discussions about plan of care and coordination of care plan   Artis Delay, MD 06/18/2022 1:59 PM  INTERVAL HISTORY: Please see below for problem oriented charting. he returns for treatment follow-up with his daughter He had recent fall several weeks ago with significant bruising on the right thigh region The patient is noted to have significant hypotension but he is not symptomatic We spent a lot of time reviewing test results The patient denies any recent signs or symptoms of  bleeding such as spontaneous epistaxis, hematuria or hematochezia.  REVIEW OF SYSTEMS:   Constitutional: Denies fevers, chills or abnormal weight loss Eyes: Denies blurriness of vision Ears, nose, mouth, throat, and face: Denies mucositis or sore throat Respiratory: Denies cough, dyspnea or wheezes Cardiovascular: Denies palpitation, chest discomfort or lower extremity swelling Gastrointestinal:  Denies nausea, heartburn or change in bowel habits Skin: Denies abnormal skin rashes Lymphatics: Denies new lymphadenopathy or easy bruising Behavioral/Psych: Mood is stable, no new changes  All other systems were reviewed with the patient and are negative.  I have reviewed the past medical history, past surgical history, social history and family history with the patient and they are unchanged from previous note.  ALLERGIES:  is allergic to sulfa antibiotics and sulfonamide derivatives.  MEDICATIONS:  Current Outpatient Medications  Medication Sig Dispense Refill   acetaminophen (TYLENOL) 325 MG tablet Take 650 mg by mouth every 6 (six) hours as needed.     aspirin EC 81 MG tablet Take 162 mg by mouth daily. (Patient not taking: Reported on 04/01/2022)     CALCIUM CARB-CHOLECALCIFEROL PO Take 1,250 mg by mouth daily.     Cholecalciferol (VITAMIN D3) 25 MCG (1000 UT) CAPS Take 2 capsules by mouth daily.     Cyanocobalamin (VITAMIN B-12) 500 MCG SUBL Place 1 tablet under the tongue daily.      finasteride (PROSCAR) 5 MG tablet Take 1 tablet (5 mg total) by mouth daily. 90 tablet 3   folic acid (FOLVITE) 400 MCG tablet Take 400 mcg by mouth daily.     gabapentin (NEURONTIN) 300 MG capsule 600mg  AM and 600mg  PM. 360 capsule 3   methotrexate (  RHEUMATREX) 2.5 MG tablet Take 2 tablets (5 mg total) by mouth once a week. Caution:Chemotherapy. Protect from light. 12 tablet 11   Multiple Vitamin (MULTIVITAMIN) tablet Take 1 tablet by mouth daily.       predniSONE (DELTASONE) 5 MG tablet TAKE 1 TABLET BY  MOUTH ON MONDAYS, WEDS, AND FRIDAYS, AND TAKE 2 TABS OTHER DAYS OF THE WEEK 90 tablet 1   timolol (TIMOPTIC) 0.5 % ophthalmic solution Place 1 drop into both eyes at bedtime. One drop in each eye at bedtime.     No current facility-administered medications for this visit.    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 T-cell receptor rearrangement study is still pending   12/19/2015 -  Chemotherapy   The patient was started on 10 mg once a week methotrexate. The dose is increased to 12.5 mg starting 01/16/16      PHYSICAL EXAMINATION: ECOG PERFORMANCE STATUS: 2 - Symptomatic, <50% confined to bed  Vitals:   06/18/22 1126  BP: (!) 96/54  Pulse: 65  Resp: 18  Temp: 97.8 F (36.6 C)  SpO2: 99%   Filed Weights   06/18/22 1126  Weight: 200 lb (90.7 kg)    GENERAL:alert, no distress and comfortable.  He looks thin and cachectic with signs of muscle wasting SKIN: skin color, texture, turgor are normal, no rashes or significant lesions EYES: normal, Conjunctiva are pink and non-injected, sclera clear OROPHARYNX:no exudate, no erythema and lips, buccal mucosa, and tongue normal  NECK: supple, thyroid normal size, non-tender, without nodularity LYMPH:  no palpable lymphadenopathy in the cervical, axillary or inguinal LUNGS: clear to auscultation and percussion with normal breathing effort HEART: regular rate & rhythm and no murmurs and no lower extremity edema ABDOMEN:abdomen soft, non-tender and normal bowel sounds Musculoskeletal:no cyanosis of digits and no clubbing  NEURO: alert & oriented x 3 with fluent speech, no focal motor/sensory deficits  LABORATORY DATA:  I have reviewed the data as listed    Component Value Date/Time   NA 140 06/18/2022 1046   NA 138 12/15/2016 1319   K 3.9 06/18/2022 1046   K 4.1 12/15/2016 1319   CL 107  06/18/2022 1046   CO2 28 06/18/2022 1046   CO2 24 12/15/2016 1319   GLUCOSE 96 06/18/2022 1046   GLUCOSE 123 12/15/2016 1319   BUN 20 06/18/2022 1046   BUN 13.7 12/15/2016 1319   CREATININE 1.05 06/18/2022 1046   CREATININE 1.2 12/15/2016 1319   CALCIUM 8.6 (L) 06/18/2022 1046   CALCIUM 8.8 12/15/2016 1319   PROT 6.3 (L) 06/18/2022 1046   PROT 6.5 12/15/2016 1319   PROT 6.2 12/15/2016 1319   ALBUMIN 3.6 06/18/2022 1046   ALBUMIN 3.5 12/15/2016 1319   AST 31 06/18/2022 1046   AST 66 (H) 12/15/2016 1319   ALT 41 06/18/2022 1046   ALT 90 (H) 12/15/2016 1319   ALKPHOS 116 06/18/2022 1046   ALKPHOS 116 12/15/2016 1319   BILITOT 1.0 06/18/2022 1046   BILITOT 1.13 12/15/2016 1319   GFRNONAA >60 06/18/2022 1046   GFRAA >60 06/27/2019 0857    No results found for: "SPEP", "UPEP"  Lab Results  Component Value Date   WBC 4.7 06/18/2022   NEUTROABS 0.5 (L) 06/18/2022   HGB 8.0 (L) 06/18/2022   HCT 24.4 (L) 06/18/2022   MCV 127.1 (H) 06/18/2022   PLT 215 06/18/2022  Chemistry      Component Value Date/Time   NA 140 06/18/2022 1046   NA 138 12/15/2016 1319   K 3.9 06/18/2022 1046   K 4.1 12/15/2016 1319   CL 107 06/18/2022 1046   CO2 28 06/18/2022 1046   CO2 24 12/15/2016 1319   BUN 20 06/18/2022 1046   BUN 13.7 12/15/2016 1319   CREATININE 1.05 06/18/2022 1046   CREATININE 1.2 12/15/2016 1319      Component Value Date/Time   CALCIUM 8.6 (L) 06/18/2022 1046   CALCIUM 8.8 12/15/2016 1319   ALKPHOS 116 06/18/2022 1046   ALKPHOS 116 12/15/2016 1319   AST 31 06/18/2022 1046   AST 66 (H) 12/15/2016 1319   ALT 41 06/18/2022 1046   ALT 90 (H) 12/15/2016 1319   BILITOT 1.0 06/18/2022 1046   BILITOT 1.13 12/15/2016 1319

## 2022-06-18 NOTE — Assessment & Plan Note (Signed)
This is multifactorial Iron studies and B12 are pending The patient is instructed to hold methotrexate He does not need blood transfusion right now It could be related to worsening bone marrow disease or progression of multiple myeloma I will call his daughter with test results He might need repeat bone marrow aspirate and biopsy for evaluation

## 2022-06-18 NOTE — Assessment & Plan Note (Signed)
Since his methotrexate dose was reduced, I noted slight lymphocytosis but the absolute lymphocyte count is still low He is quite anemic I am concerned about his severe anemia Further tests are pending He will hold methotrexate for now

## 2022-06-19 ENCOUNTER — Telehealth: Payer: Self-pay

## 2022-06-19 LAB — KAPPA/LAMBDA LIGHT CHAINS
Kappa free light chain: 35.1 mg/L — ABNORMAL HIGH (ref 3.3–19.4)
Kappa, lambda light chain ratio: 4.39 — ABNORMAL HIGH (ref 0.26–1.65)
Lambda free light chains: 8 mg/L (ref 5.7–26.3)

## 2022-06-19 NOTE — Telephone Encounter (Signed)
Called and given below message to daughter Darl Pikes. She verbalized understanding and appreciated the call.

## 2022-06-19 NOTE — Telephone Encounter (Signed)
-----   Message from Artis Delay, MD sent at 06/19/2022  7:31 AM EDT ----- Pls call daughter, Darl Pikes His iron and B12 are normal Myeloma panel is pending, will call her next week

## 2022-06-26 LAB — MULTIPLE MYELOMA PANEL, SERUM
Albumin SerPl Elph-Mcnc: 3.3 g/dL (ref 2.9–4.4)
Albumin/Glob SerPl: 1.2 (ref 0.7–1.7)
Alpha 1: 0.2 g/dL (ref 0.0–0.4)
Alpha2 Glob SerPl Elph-Mcnc: 0.5 g/dL (ref 0.4–1.0)
B-Globulin SerPl Elph-Mcnc: 0.5 g/dL — ABNORMAL LOW (ref 0.7–1.3)
Gamma Glob SerPl Elph-Mcnc: 1.7 g/dL (ref 0.4–1.8)
Globulin, Total: 2.9 g/dL (ref 2.2–3.9)
IgA: 20 mg/dL — ABNORMAL LOW (ref 61–437)
IgG (Immunoglobin G), Serum: 1682 mg/dL — ABNORMAL HIGH (ref 603–1613)
IgM (Immunoglobulin M), Srm: 14 mg/dL — ABNORMAL LOW (ref 15–143)
M Protein SerPl Elph-Mcnc: 1.4 g/dL — ABNORMAL HIGH
Total Protein ELP: 6.2 g/dL (ref 6.0–8.5)

## 2022-06-30 ENCOUNTER — Telehealth: Payer: Self-pay | Admitting: Hematology and Oncology

## 2022-06-30 NOTE — Telephone Encounter (Signed)
I have reviewed his recent myeloma panel with the patient's daughter Even though the myeloma panel is stable, overall, I am concerned about disease progression in his bone marrow given significant anemia seen despite low-dose methotrexate We discussed the risk and benefits of pursuing bone marrow biopsy versus supportive care with blood transfusion only We discussed possibility of transferring his care back to [facility] to be closer to his home The patient's daughter will discuss this with her brother I recommend consideration for return visit next week for further discussion about plan of care We discussed goals of care today His daughter felt that the patient might have unrealistic expectation

## 2022-07-02 ENCOUNTER — Telehealth: Payer: Self-pay

## 2022-07-02 ENCOUNTER — Other Ambulatory Visit: Payer: Self-pay | Admitting: Hematology and Oncology

## 2022-07-02 DIAGNOSIS — C91Z0 Other lymphoid leukemia not having achieved remission: Secondary | ICD-10-CM

## 2022-07-02 NOTE — Telephone Encounter (Signed)
Returned call to Darl Pikes about scheduling appt for Mr. Parrow. Scheduled appt on 6/4 at 2 pm for 45 mins. Darl Pikes is aware of appt date/time.

## 2022-07-07 ENCOUNTER — Inpatient Hospital Stay: Payer: Medicare PPO | Attending: Hematology and Oncology | Admitting: Hematology and Oncology

## 2022-07-07 ENCOUNTER — Other Ambulatory Visit: Payer: Self-pay

## 2022-07-07 ENCOUNTER — Encounter: Payer: Self-pay | Admitting: Hematology and Oncology

## 2022-07-07 ENCOUNTER — Inpatient Hospital Stay: Payer: Medicare PPO

## 2022-07-07 VITALS — BP 106/63 | HR 70 | Temp 98.8°F | Resp 18 | Ht 76.0 in

## 2022-07-07 DIAGNOSIS — Z79899 Other long term (current) drug therapy: Secondary | ICD-10-CM | POA: Diagnosis not present

## 2022-07-07 DIAGNOSIS — R296 Repeated falls: Secondary | ICD-10-CM

## 2022-07-07 DIAGNOSIS — C91Z0 Other lymphoid leukemia not having achieved remission: Secondary | ICD-10-CM | POA: Diagnosis not present

## 2022-07-07 DIAGNOSIS — D61818 Other pancytopenia: Secondary | ICD-10-CM

## 2022-07-07 DIAGNOSIS — Z7189 Other specified counseling: Secondary | ICD-10-CM | POA: Insufficient documentation

## 2022-07-07 DIAGNOSIS — Z7982 Long term (current) use of aspirin: Secondary | ICD-10-CM | POA: Diagnosis not present

## 2022-07-07 LAB — CBC WITH DIFFERENTIAL/PLATELET
Abs Immature Granulocytes: 0.03 10*3/uL (ref 0.00–0.07)
Basophils Absolute: 0 10*3/uL (ref 0.0–0.1)
Basophils Relative: 1 %
Eosinophils Absolute: 0.1 10*3/uL (ref 0.0–0.5)
Eosinophils Relative: 2 %
HCT: 25.7 % — ABNORMAL LOW (ref 39.0–52.0)
Hemoglobin: 8.6 g/dL — ABNORMAL LOW (ref 13.0–17.0)
Immature Granulocytes: 1 %
Lymphocytes Relative: 68 %
Lymphs Abs: 2.6 10*3/uL (ref 0.7–4.0)
MCH: 41.5 pg — ABNORMAL HIGH (ref 26.0–34.0)
MCHC: 33.5 g/dL (ref 30.0–36.0)
MCV: 124.2 fL — ABNORMAL HIGH (ref 80.0–100.0)
Monocytes Absolute: 0.2 10*3/uL (ref 0.1–1.0)
Monocytes Relative: 5 %
Neutro Abs: 0.8 10*3/uL — ABNORMAL LOW (ref 1.7–7.7)
Neutrophils Relative %: 23 %
Platelets: 238 10*3/uL (ref 150–400)
RBC: 2.07 MIL/uL — ABNORMAL LOW (ref 4.22–5.81)
RDW: 18 % — ABNORMAL HIGH (ref 11.5–15.5)
WBC: 3.7 10*3/uL — ABNORMAL LOW (ref 4.0–10.5)
nRBC: 0 % (ref 0.0–0.2)

## 2022-07-07 LAB — SAMPLE TO BLOOD BANK

## 2022-07-07 LAB — SEDIMENTATION RATE: Sed Rate: 8 mm/hr (ref 0–16)

## 2022-07-07 LAB — ABO/RH: ABO/RH(D): O POS

## 2022-07-07 NOTE — Assessment & Plan Note (Signed)
We have long discussions about goals of care The patient has advanced directives and living will The patient is clear about his desire not to undergo aggressive chemotherapy We discussed risk and benefits of supportive transfusion We also discussed the role of home-based palliative care At the end of the day, he is undecided I will call his daughter with the rest of his test results from today and final decision from the family

## 2022-07-07 NOTE — Assessment & Plan Note (Signed)
I have reviewed test results with the patient We discussed the risk and benefits of bone marrow aspirate and biopsy With his treatment being placed on hold 2 weeks ago, his hemoglobin has improved  We spent a lot of time discussing goals of care The patient is clear with his desire not to undergo aggressive treatment such as other chemotherapy  Ultimately, the patient and family would have to decide whether he want to resume methotrexate at lower dose If so, he would need frequent blood count monitoring once a month

## 2022-07-07 NOTE — Assessment & Plan Note (Signed)
I am concerned about his risk of frequent falls, exacerbated by peripheral neuropathy, weakness, protein calorie malnutrition, anemia, steroid myopathy and others

## 2022-07-07 NOTE — Progress Notes (Signed)
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[facility]) I have reviewed test results with the patient We discussed the risk and benefits of bone marrow aspirate and biopsy With his treatment being placed on hold 2 weeks ago, his hemoglobin has improved  We spent a lot of time discussing goals of care The patient is clear with his desire not to undergo aggressive treatment such as other chemotherapy  Ultimately, the patient and family would have to decide whether he want to resume methotrexate at lower dose If so, he would need frequent blood count monitoring once a month  Pancytopenia, acquired (HCC) Recent blood work showed improvement B12 and iron studies are adequate He does not need transfusion support today We discussed the risk and benefits of ESA; results of EPO level is pending We also discussed risk and benefits of future blood transfusion  Frequent falls I am concerned about his risk of frequent falls, exacerbated by peripheral neuropathy, weakness, protein calorie malnutrition, anemia, steroid myopathy and others  Goals of care, counseling/discussion We have long discussions about goals of care The patient has advanced directives and living will The patient is clear about his desire not to undergo aggressive chemotherapy We discussed risk and benefits of supportive transfusion We also discussed the role of home-based palliative care At the end of the day, he is undecided I will call his daughter with the rest of his test results from today and final decision from the family  No orders of the defined types were placed in this encounter.   All questions were answered. The patient knows to call the clinic with any problems, questions or  concerns. The total time spent in the appointment was 40 minutes encounter with patients including review of chart and various tests results, discussions about plan of care and coordination of care plan   Artis Delay, MD 07/07/2022 3:09 PM  INTERVAL HISTORY: Please see below for problem oriented charting. he returns for treatment follow-up and discussions about plan of care I reviewed his blood test from today We spent majority of our time reviewing his diagnosis, the role of bone marrow biopsy, the role of chemotherapy, role of ESA and transfusion support as well as discussed prognosis  REVIEW OF SYSTEMS:   All other systems were reviewed with the patient and are negative.  I have reviewed the past medical history, past surgical history, social history and family history with the patient and they are unchanged from previous note.  ALLERGIES:  is allergic to sulfa antibiotics and sulfonamide derivatives.  MEDICATIONS:  Current Outpatient Medications  Medication Sig Dispense Refill   acetaminophen (TYLENOL) 325 MG tablet Take 650 mg by mouth every 6 (six) hours as needed.     aspirin EC 81 MG tablet Take 162 mg by mouth daily. (Patient not taking: Reported on 04/01/2022)     CALCIUM CARB-CHOLECALCIFEROL PO Take 1,250 mg by mouth daily.     Cholecalciferol (VITAMIN D3) 25 MCG (1000 UT) CAPS Take 2 capsules by mouth daily.     Cyanocobalamin (VITAMIN B-12) 500 MCG SUBL Place 1 tablet under the tongue daily.      finasteride (PROSCAR) 5 MG tablet Take 1 tablet (5 mg total) by mouth daily. 90 tablet 3   folic acid (FOLVITE) 400 MCG tablet Take 400  mcg by mouth daily.     gabapentin (NEURONTIN) 300 MG capsule TAKE 2 CAPSULES BY MOUTH 2 TIMES DAILY. 360 capsule 3   methotrexate (RHEUMATREX) 2.5 MG tablet Take 2 tablets (5 mg total) by mouth once a week. Caution:Chemotherapy. Protect from light. 12 tablet 11   Multiple Vitamin (MULTIVITAMIN) tablet Take 1 tablet by mouth daily.       predniSONE  (DELTASONE) 5 MG tablet TAKE 1 TABLET BY MOUTH ON MONDAYS, WEDS, AND FRIDAYS, AND TAKE 2 TABS OTHER DAYS OF THE WEEK 90 tablet 1   timolol (TIMOPTIC) 0.5 % ophthalmic solution Place 1 drop into both eyes at bedtime. One drop in each eye at bedtime.     No current facility-administered medications for this visit.    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 T-cell receptor rearrangement study is still pending   12/19/2015 -  Chemotherapy   The patient was started on 10 mg once a week methotrexate. The dose is increased to 12.5 mg starting 01/16/16      PHYSICAL EXAMINATION: ECOG PERFORMANCE STATUS: 2 - Symptomatic, <50% confined to bed  Vitals:   07/07/22 1403  BP: 106/63  Pulse: 70  Resp: 18  Temp: 98.8 F (37.1 C)  SpO2: 100%   There were no vitals filed for this visit.  GENERAL:alert, no distress and comfortable  NEURO: alert & oriented x 3 with fluent speech, no focal motor/sensory deficits  LABORATORY DATA:  I have reviewed the data as listed    Component Value Date/Time   NA 140 06/18/2022 1046   NA 138 12/15/2016 1319   K 3.9 06/18/2022 1046   K 4.1 12/15/2016 1319   CL 107 06/18/2022 1046   CO2 28 06/18/2022 1046   CO2 24 12/15/2016 1319   GLUCOSE 96 06/18/2022 1046   GLUCOSE 123 12/15/2016 1319   BUN 20 06/18/2022 1046   BUN 13.7 12/15/2016 1319   CREATININE 1.05 06/18/2022 1046   CREATININE 1.2 12/15/2016 1319   CALCIUM 8.6 (L) 06/18/2022 1046   CALCIUM 8.8 12/15/2016 1319   PROT 6.3 (L) 06/18/2022 1046   PROT 6.5 12/15/2016 1319   PROT 6.2 12/15/2016 1319   ALBUMIN 3.6 06/18/2022 1046   ALBUMIN 3.5 12/15/2016 1319   AST 31 06/18/2022 1046   AST 66 (H) 12/15/2016 1319   ALT 41 06/18/2022 1046   ALT 90 (H) 12/15/2016 1319   ALKPHOS 116 06/18/2022 1046   ALKPHOS 116 12/15/2016 1319   BILITOT 1.0 06/18/2022 1046    BILITOT 1.13 12/15/2016 1319   GFRNONAA >60 06/18/2022 1046   GFRAA >60 06/27/2019 0857    No results found for: "SPEP", "UPEP"  Lab Results  Component Value Date   WBC 3.7 (L) 07/07/2022   NEUTROABS 0.8 (L) 07/07/2022   HGB 8.6 (L) 07/07/2022   HCT 25.7 (L) 07/07/2022   MCV 124.2 (H) 07/07/2022   PLT 238 07/07/2022      Chemistry      Component Value Date/Time   NA 140 06/18/2022 1046   NA 138 12/15/2016 1319   K 3.9 06/18/2022 1046   K 4.1 12/15/2016 1319   CL 107 06/18/2022 1046   CO2 28 06/18/2022 1046   CO2 24 12/15/2016 1319   BUN 20 06/18/2022 1046   BUN 13.7 12/15/2016 1319   CREATININE 1.05 06/18/2022 1046   CREATININE 1.2 12/15/2016 1319  Component Value Date/Time   CALCIUM 8.6 (L) 06/18/2022 1046   CALCIUM 8.8 12/15/2016 1319   ALKPHOS 116 06/18/2022 1046   ALKPHOS 116 12/15/2016 1319   AST 31 06/18/2022 1046   AST 66 (H) 12/15/2016 1319   ALT 41 06/18/2022 1046   ALT 90 (H) 12/15/2016 1319   BILITOT 1.0 06/18/2022 1046   BILITOT 1.13 12/15/2016 1319

## 2022-07-07 NOTE — Assessment & Plan Note (Signed)
Recent blood work showed improvement B12 and iron studies are adequate He does not need transfusion support today We discussed the risk and benefits of ESA; results of EPO level is pending We also discussed risk and benefits of future blood transfusion

## 2022-07-08 LAB — ERYTHROPOIETIN: Erythropoietin: 426.7 m[IU]/mL — ABNORMAL HIGH (ref 2.6–18.5)

## 2022-07-13 ENCOUNTER — Telehealth: Payer: Self-pay | Admitting: Hematology and Oncology

## 2022-07-13 NOTE — Telephone Encounter (Signed)
I spoke with his daughter, Darl Pikes We reviewed test results Serum erythropoietin level is borderline high; I think the benefit of erythropoietin stimulating agent to treat his anemia is not going to be of great benefit The patient has further discussion with family members regarding goals of care She has explored the concept and possibility of palliative care/hospice enrollment for her dad   We have a long discussion regarding prognosis Ultimately, she would like to discuss goals of care further with her family and will get back to me once they have reached a decision

## 2022-07-22 ENCOUNTER — Telehealth: Payer: Self-pay

## 2022-07-22 NOTE — Telephone Encounter (Signed)
Pt's daughter, Jaci Standard, called and LVM stating that Pt has decided not to start methotrexate. Darl Pikes asks what the next steps will be. Routed to MD to advise.

## 2022-07-22 NOTE — Telephone Encounter (Signed)
Called and LVM with below response from MD. Asked for call back to confirm desire for palliative care referral and if follow up will be done here or with PCP.

## 2022-07-22 NOTE — Telephone Encounter (Signed)
Next step would be palliative care referral The patient needs to decide if he wants to continue follow-up here or just with PCP; goal for follow-up to just monitor cbc and transfuse as needed

## 2022-07-23 ENCOUNTER — Telehealth: Payer: Self-pay

## 2022-07-23 ENCOUNTER — Telehealth: Payer: Self-pay | Admitting: Hematology and Oncology

## 2022-07-23 ENCOUNTER — Other Ambulatory Visit: Payer: Self-pay | Admitting: Hematology and Oncology

## 2022-07-23 NOTE — Telephone Encounter (Signed)
I have sent LOS to see him in about 6 weeks with labs Please send referral

## 2022-07-23 NOTE — Telephone Encounter (Signed)
Returned [employer]. They have decided to go with palliative care of [facility]. Told her the office would send the referral. They would like to see Dr. Bertis Ruddy for labs/ transfusion support if needed.

## 2022-07-23 NOTE — Telephone Encounter (Signed)
Spoke with patient daughter confirming upcoming appointment 

## 2022-07-23 NOTE — Telephone Encounter (Signed)
Called Seth Bake with [facility] with palliative care referral. They will contact Darl Pikes daughter to enroll into the palliative care referral.  Clide Cliff back and given message from Dr. Bertis Ruddy. Told her Referral to palliative care sent and they will contact her. She verbalized understanding. She will call the office back for questions.

## 2022-08-03 ENCOUNTER — Other Ambulatory Visit: Payer: Self-pay | Admitting: Hematology and Oncology

## 2022-09-08 ENCOUNTER — Other Ambulatory Visit: Payer: Self-pay

## 2022-09-08 ENCOUNTER — Inpatient Hospital Stay: Payer: Medicare PPO | Attending: Hematology and Oncology

## 2022-09-08 ENCOUNTER — Inpatient Hospital Stay: Payer: Medicare PPO | Admitting: Hematology and Oncology

## 2022-09-08 ENCOUNTER — Encounter: Payer: Self-pay | Admitting: Hematology and Oncology

## 2022-09-08 VITALS — BP 103/64 | HR 73 | Resp 18 | Ht 76.0 in | Wt 185.3 lb

## 2022-09-08 DIAGNOSIS — C91Z0 Other lymphoid leukemia not having achieved remission: Secondary | ICD-10-CM | POA: Diagnosis not present

## 2022-09-08 DIAGNOSIS — D472 Monoclonal gammopathy: Secondary | ICD-10-CM

## 2022-09-08 DIAGNOSIS — G6181 Chronic inflammatory demyelinating polyneuritis: Secondary | ICD-10-CM

## 2022-09-08 DIAGNOSIS — Z79899 Other long term (current) drug therapy: Secondary | ICD-10-CM | POA: Diagnosis not present

## 2022-09-08 DIAGNOSIS — Z7982 Long term (current) use of aspirin: Secondary | ICD-10-CM | POA: Insufficient documentation

## 2022-09-08 DIAGNOSIS — Z7952 Long term (current) use of systemic steroids: Secondary | ICD-10-CM | POA: Diagnosis not present

## 2022-09-08 DIAGNOSIS — D539 Nutritional anemia, unspecified: Secondary | ICD-10-CM | POA: Diagnosis not present

## 2022-09-08 LAB — SAMPLE TO BLOOD BANK

## 2022-09-08 LAB — CBC WITH DIFFERENTIAL/PLATELET
Abs Immature Granulocytes: 0 10*3/uL (ref 0.00–0.07)
Basophils Absolute: 0 10*3/uL (ref 0.0–0.1)
Basophils Relative: 0 %
Eosinophils Absolute: 0.2 10*3/uL (ref 0.0–0.5)
Eosinophils Relative: 3 %
HCT: 27.3 % — ABNORMAL LOW (ref 39.0–52.0)
Hemoglobin: 9.1 g/dL — ABNORMAL LOW (ref 13.0–17.0)
Immature Granulocytes: 0 %
Lymphocytes Relative: 83 %
Lymphs Abs: 5.1 10*3/uL — ABNORMAL HIGH (ref 0.7–4.0)
MCH: 40.8 pg — ABNORMAL HIGH (ref 26.0–34.0)
MCHC: 33.3 g/dL (ref 30.0–36.0)
MCV: 122.4 fL — ABNORMAL HIGH (ref 80.0–100.0)
Monocytes Absolute: 0.3 10*3/uL (ref 0.1–1.0)
Monocytes Relative: 5 %
Neutro Abs: 0.5 10*3/uL — ABNORMAL LOW (ref 1.7–7.7)
Neutrophils Relative %: 9 %
Platelets: 217 10*3/uL (ref 150–400)
RBC: 2.23 MIL/uL — ABNORMAL LOW (ref 4.22–5.81)
RDW: 18.1 % — ABNORMAL HIGH (ref 11.5–15.5)
WBC: 6.2 10*3/uL (ref 4.0–10.5)
nRBC: 0 % (ref 0.0–0.2)

## 2022-09-08 NOTE — Progress Notes (Signed)
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[facility]) We have a long discussion in the past about his treatment Due to his frail status, ultimately, we are in agreement not to pursue further therapy With discontinuation of methotrexate, his hemoglobin has improved, at the expense of worsening lymphocytosis of which he is not symptomatic I will discontinue future follow-up  CIDP (chronic inflammatory demyelinating polyneuropathy) (HCC) The patient is stable on current dose of prednisone His primary care doctor has increased the dose of gabapentin recently which I think is reasonable He will continue gabapentin along with prednisone for his CIDP  MGUS (monoclonal gammopathy of unknown significance) Myeloma panel is stable There were no signs of progression to multiple myeloma He does not need long-term follow-up  Macrocytic anemia He has multifactorial anemia, evaluated extensively with recent blood work He has not have repeat bone marrow biopsy but given his frail status, we are in agreement not to pursue this Since discontinuation of methotrexate, his blood count has improved and he is not in need for blood transfusion I recommend close monitoring with his local primary care doctor every 3 months for the next few years His daughter will keep me up-to-date and I will bring him back here if he needs blood transfusion support   No orders of the defined types were placed in this encounter.   All questions were answered. The patient knows to call the clinic with any problems, questions or concerns. The total time spent in the appointment was 30 minutes encounter with patients including review of chart and various tests results, discussions about plan of  care and coordination of care plan   Artis Delay, MD 09/08/2022 12:37 PM  INTERVAL HISTORY: Please see below for problem oriented charting. he returns for treatment follow-up with his daughter He has lost some weight since last time I saw him He tries his best to eat when he can No recent falls or infection We discussed test results and discussed future plan of care  REVIEW OF SYSTEMS:   Constitutional: Denies fevers, chills  Eyes: Denies blurriness of vision Ears, nose, mouth, throat, and face: Denies mucositis or sore throat Respiratory: Denies cough, dyspnea or wheezes Cardiovascular: Denies palpitation, chest discomfort or lower extremity swelling Gastrointestinal:  Denies nausea, heartburn or change in bowel habits Skin: Denies abnormal skin rashes Lymphatics: Denies new lymphadenopathy or easy bruising Neurological:Denies numbness, tingling or new weaknesses Behavioral/Psych: Mood is stable, no new changes  All other systems were reviewed with the patient and are negative.  I have reviewed the past medical history, past surgical history, social history and family history with the patient and they are unchanged from previous note.  ALLERGIES:  is allergic to sulfa antibiotics and sulfonamide derivatives.  MEDICATIONS:  Current Outpatient Medications  Medication Sig Dispense Refill   acetaminophen (TYLENOL) 325 MG tablet Take 650 mg by mouth every 6 (six) hours as needed.     aspirin EC 81 MG tablet Take 162 mg by mouth daily. (Patient not taking: Reported on 04/01/2022)     CALCIUM CARB-CHOLECALCIFEROL PO Take 1,250 mg by mouth daily.     Cholecalciferol (VITAMIN D3) 25 MCG (1000 UT) CAPS Take 2 capsules by mouth daily.     Cyanocobalamin (VITAMIN B-12) 500 MCG SUBL Place 1 tablet under the  tongue daily.      finasteride (PROSCAR) 5 MG tablet Take 1 tablet (5 mg total) by mouth daily. 90 tablet 3   folic acid (FOLVITE) 400 MCG tablet Take 400 mcg by mouth daily.      gabapentin (NEURONTIN) 300 MG capsule TAKE 2 CAPSULES BY MOUTH 2 TIMES DAILY. 360 capsule 3   Multiple Vitamin (MULTIVITAMIN) tablet Take 1 tablet by mouth daily.       predniSONE (DELTASONE) 5 MG tablet TAKE 1 TABLET BY MOUTH ON MONDAYS, WEDS, AND FRIDAYS, AND TAKE 2 TABS OTHER DAYS OF THE WEEK 90 tablet 1   timolol (TIMOPTIC) 0.5 % ophthalmic solution Place 1 drop into both eyes at bedtime. One drop in each eye at bedtime.     No current facility-administered medications for this visit.    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 T-cell receptor rearrangement study is still pending   12/19/2015 - 08/03/2022 Chemotherapy   The patient was started on 10 mg once a week methotrexate. The dose is increased to 12.5 mg starting 01/16/16, subsequently discontinued due to frail status and worsening anemia      PHYSICAL EXAMINATION: ECOG PERFORMANCE STATUS: 2 - Symptomatic, <50% confined to bed  Vitals:   09/08/22 1215  BP: 103/64  Pulse: 73  Resp: 18  SpO2: 100%   Filed Weights   09/08/22 1215  Weight: 185 lb 4.8 oz (84.1 kg)    GENERAL:alert, no distress and comfortable NEURO: alert & oriented x 3 with fluent speech, no focal motor/sensory deficits  LABORATORY DATA:  I have reviewed the data as listed    Component Value Date/Time   NA 140 06/18/2022 1046   NA 138 12/15/2016 1319   K 3.9 06/18/2022 1046   K 4.1 12/15/2016 1319   CL 107 06/18/2022 1046   CO2 28 06/18/2022 1046   CO2 24 12/15/2016 1319   GLUCOSE 96 06/18/2022 1046   GLUCOSE 123 12/15/2016 1319   BUN 20 06/18/2022 1046   BUN 13.7 12/15/2016 1319   CREATININE 1.05 06/18/2022 1046   CREATININE 1.2 12/15/2016 1319   CALCIUM 8.6 (L) 06/18/2022 1046   CALCIUM 8.8 12/15/2016 1319   PROT 6.3 (L) 06/18/2022 1046   PROT 6.5 12/15/2016 1319   PROT 6.2 12/15/2016 1319   ALBUMIN 3.6  06/18/2022 1046   ALBUMIN 3.5 12/15/2016 1319   AST 31 06/18/2022 1046   AST 66 (H) 12/15/2016 1319   ALT 41 06/18/2022 1046   ALT 90 (H) 12/15/2016 1319   ALKPHOS 116 06/18/2022 1046   ALKPHOS 116 12/15/2016 1319   BILITOT 1.0 06/18/2022 1046   BILITOT 1.13 12/15/2016 1319   GFRNONAA >60 06/18/2022 1046   GFRAA >60 06/27/2019 0857    No results found for: "SPEP", "UPEP"  Lab Results  Component Value Date   WBC 6.2 09/08/2022   NEUTROABS 0.5 (L) 09/08/2022   HGB 9.1 (L) 09/08/2022   HCT 27.3 (L) 09/08/2022   MCV 122.4 (H) 09/08/2022   PLT 217 09/08/2022      Chemistry      Component Value Date/Time   NA 140 06/18/2022 1046   NA 138 12/15/2016 1319   K 3.9 06/18/2022 1046   K 4.1 12/15/2016 1319   CL 107 06/18/2022 1046   CO2 28 06/18/2022 1046   CO2 24 12/15/2016 1319   BUN 20 06/18/2022 1046  BUN 13.7 12/15/2016 1319   CREATININE 1.05 06/18/2022 1046   CREATININE 1.2 12/15/2016 1319      Component Value Date/Time   CALCIUM 8.6 (L) 06/18/2022 1046   CALCIUM 8.8 12/15/2016 1319   ALKPHOS 116 06/18/2022 1046   ALKPHOS 116 12/15/2016 1319   AST 31 06/18/2022 1046   AST 66 (H) 12/15/2016 1319   ALT 41 06/18/2022 1046   ALT 90 (H) 12/15/2016 1319   BILITOT 1.0 06/18/2022 1046   BILITOT 1.13 12/15/2016 1319

## 2022-09-08 NOTE — Assessment & Plan Note (Signed)
The patient is stable on current dose of prednisone His primary care doctor has increased the dose of gabapentin recently which I think is reasonable He will continue gabapentin along with prednisone for his CIDP

## 2022-09-08 NOTE — Assessment & Plan Note (Signed)
Myeloma panel is stable There were no signs of progression to multiple myeloma He does not need long-term follow-up

## 2022-09-08 NOTE — Assessment & Plan Note (Signed)
We have a long discussion in the past about his treatment Due to his frail status, ultimately, we are in agreement not to pursue further therapy With discontinuation of methotrexate, his hemoglobin has improved, at the expense of worsening lymphocytosis of which he is not symptomatic I will discontinue future follow-up

## 2022-09-08 NOTE — Assessment & Plan Note (Signed)
He has multifactorial anemia, evaluated extensively with recent blood work He has not have repeat bone marrow biopsy but given his frail status, we are in agreement not to pursue this Since discontinuation of methotrexate, his blood count has improved and he is not in need for blood transfusion I recommend close monitoring with his local primary care doctor every 3 months for the next few years His daughter will keep me up-to-date and I will bring him back here if he needs blood transfusion support

## 2022-09-17 ENCOUNTER — Encounter (INDEPENDENT_AMBULATORY_CARE_PROVIDER_SITE_OTHER): Payer: Self-pay

## 2022-10-14 ENCOUNTER — Other Ambulatory Visit: Payer: Self-pay | Admitting: Hematology and Oncology

## 2022-10-16 ENCOUNTER — Telehealth: Payer: Self-pay | Admitting: Family Medicine

## 2022-10-16 DIAGNOSIS — D649 Anemia, unspecified: Secondary | ICD-10-CM

## 2022-10-16 DIAGNOSIS — R739 Hyperglycemia, unspecified: Secondary | ICD-10-CM

## 2022-10-16 NOTE — Telephone Encounter (Signed)
Pt's daughter, Darl Pikes, called stating she won't be bringing the pt to his appt on 9/16, but her brother will. Darl Pikes stated the pt's oncologist, Dr. Artis Delay, requested for the pt have some labs done & was wondering could the labs be done during his visit on 9/16? Told pt Para March would have to order the labs & usually our office doesn't order labs for other providers. Darl Pikes stated she doesn't know the specific labs Dr. Bertis Ruddy was requesting. Darl Pikes asked if there was a way both providers can converse to see what labs are being needed? Call back # [phone]

## 2022-10-18 NOTE — Addendum Note (Signed)
Addended by: Joaquim Nam on: 10/18/2022 02:29 PM   Modules accepted: Orders

## 2022-10-18 NOTE — Telephone Encounter (Signed)
I looked back at his notes from hematology and I appreciate input from Dr. Bertis Ruddy.  I put in the routine follow-up labs that we should both need.  I routed this to Dr. Bertis Ruddy in the meantime, in case any additional labs need to get collected.

## 2022-10-19 ENCOUNTER — Ambulatory Visit (INDEPENDENT_AMBULATORY_CARE_PROVIDER_SITE_OTHER): Payer: Medicare PPO | Admitting: Family Medicine

## 2022-10-19 ENCOUNTER — Encounter: Payer: Self-pay | Admitting: Family Medicine

## 2022-10-19 VITALS — BP 130/62 | HR 74 | Temp 97.7°F | Ht 76.0 in | Wt 185.0 lb

## 2022-10-19 DIAGNOSIS — K5900 Constipation, unspecified: Secondary | ICD-10-CM | POA: Diagnosis not present

## 2022-10-19 DIAGNOSIS — D539 Nutritional anemia, unspecified: Secondary | ICD-10-CM

## 2022-10-19 DIAGNOSIS — Z7189 Other specified counseling: Secondary | ICD-10-CM

## 2022-10-19 DIAGNOSIS — D649 Anemia, unspecified: Secondary | ICD-10-CM

## 2022-10-19 DIAGNOSIS — Z0000 Encounter for general adult medical examination without abnormal findings: Secondary | ICD-10-CM

## 2022-10-19 DIAGNOSIS — G6181 Chronic inflammatory demyelinating polyneuritis: Secondary | ICD-10-CM | POA: Diagnosis not present

## 2022-10-19 DIAGNOSIS — R739 Hyperglycemia, unspecified: Secondary | ICD-10-CM | POA: Diagnosis not present

## 2022-10-19 DIAGNOSIS — Z66 Do not resuscitate: Secondary | ICD-10-CM

## 2022-10-19 LAB — COMPREHENSIVE METABOLIC PANEL
ALT: 24 U/L (ref 0–53)
AST: 20 U/L (ref 0–37)
Albumin: 3.3 g/dL — ABNORMAL LOW (ref 3.5–5.2)
Alkaline Phosphatase: 100 U/L (ref 39–117)
BUN: 21 mg/dL (ref 6–23)
CO2: 29 meq/L (ref 19–32)
Calcium: 8.4 mg/dL (ref 8.4–10.5)
Chloride: 105 meq/L (ref 96–112)
Creatinine, Ser: 1.04 mg/dL (ref 0.40–1.50)
GFR: 66.88 mL/min (ref >60.00)
Glucose, Bld: 88 mg/dL (ref 70–99)
Potassium: 4 meq/L (ref 3.5–5.1)
Sodium: 140 meq/L (ref 135–145)
Total Bilirubin: 1 mg/dL (ref 0.2–1.2)
Total Protein: 5.9 g/dL — ABNORMAL LOW (ref 6.0–8.3)

## 2022-10-19 LAB — CBC WITH DIFFERENTIAL/PLATELET
Basophils Absolute: 0 10*3/uL (ref 0.0–0.1)
Basophils Relative: 0.5 % (ref 0.0–3.0)
Eosinophils Absolute: 0.1 10*3/uL (ref 0.0–0.7)
Eosinophils Relative: 2.2 % (ref 0.0–5.0)
HCT: 25.3 % — ABNORMAL LOW (ref 39.0–52.0)
Hemoglobin: 8.3 g/dL — ABNORMAL LOW (ref 13.0–17.0)
Lymphocytes Relative: 81 % — ABNORMAL HIGH (ref 12.0–46.0)
Lymphs Abs: 4.6 10*3/uL — ABNORMAL HIGH (ref 0.7–4.0)
MCHC: 32.7 g/dL (ref 30.0–36.0)
MCV: 121.7 fl — ABNORMAL HIGH (ref 78.0–100.0)
Monocytes Absolute: 0.3 10*3/uL (ref 0.1–1.0)
Monocytes Relative: 4.9 % (ref 3.0–12.0)
Neutro Abs: 0.6 10*3/uL — ABNORMAL LOW (ref 1.4–7.7)
Neutrophils Relative %: 11.4 % — ABNORMAL LOW (ref 43.0–77.0)
Platelets: 238 10*3/uL (ref 150.0–400.0)
RBC: 2.08 Mil/uL — ABNORMAL LOW (ref 4.22–5.81)
RDW: 18.8 % — ABNORMAL HIGH (ref 11.5–15.5)
WBC: 5.6 10*3/uL (ref 4.0–10.5)

## 2022-10-19 LAB — FERRITIN: Ferritin: 123.1 ng/mL (ref 22.0–322.0)

## 2022-10-19 LAB — VITAMIN B12: Vitamin B-12: 717 pg/mL (ref 211–911)

## 2022-10-19 LAB — HEMOGLOBIN A1C: Hgb A1c MFr Bld: 5.1 % (ref 4.6–6.5)

## 2022-10-19 MED ORDER — POLYETHYLENE GLYCOL 3350 17 GM/SCOOP PO POWD: 17.0000 g | Freq: Every day | ORAL | Status: AC | PRN

## 2022-10-19 NOTE — Patient Instructions (Addendum)
I would get a flu shot each fall.   Check with your insurance to see if they will cover the shingles shot. Go to the lab on the way out.   If you have mychart we'll likely use that to update you.    Recheck labs in about 3 months.

## 2022-10-19 NOTE — Telephone Encounter (Signed)
Noted. Thanks.

## 2022-10-19 NOTE — Telephone Encounter (Signed)
I agree with the labs ordered CBC and CMP are all that is needed

## 2022-10-19 NOTE — Progress Notes (Addendum)
Here today with son.   Discussed hematology rec re: getting labs ~every 3 months.  He opted to stop methotrexate treatment given intolerance to treatment.  Overall deconditioned.  Still on prednisone and gabapentin at baseline.  He has a lift chair.  He may end up needing an adjustable bed and help with the stairs at home with either a ramp or lift.  He would benefit from use of a lightweight wheelchair for transport.  Family will update me as needed.    Discussed home care situation.  He is considering options.  His preference is to be at home.  Palliative care is going to come to the house in the near future.    Discussed pain from neuropathy/head pain.  Taking gabapentin with some relief.  No ADE on med.  He wanted to continue as is.    Finasteride didn't help.  He is off med in the meantime.   History of anemia noted.  See notes on follow-up labs.  Flu vaccine d/w pt.   Vaccines discussed with patient. Both kids designated equally if patient were incapacitated. He wants DNR.  D/w pt.  He has yellow sheet at the house.    He is using miralax prn for constipation and hard stools.    His wife has memory loss.  Discussed.  Meds, vitals, and allergies reviewed.   ROS: Per HPI unless specifically indicated in ROS section   In wheelchair.   Nad Ncat Neck supple, no LA Rrr Ctab Abd soft. Not ttp Skin well-perfused. 1+ BLE edema.   Decrease in muscle mass from his previous baseline noted.  40 minutes were devoted to patient care in this encounter (this includes time spent reviewing the patient's file/history, interviewing and examining the patient, counseling/reviewing plan with patient).

## 2022-10-21 ENCOUNTER — Other Ambulatory Visit: Payer: Self-pay | Admitting: Family Medicine

## 2022-10-21 ENCOUNTER — Encounter: Payer: Self-pay | Admitting: Hematology and Oncology

## 2022-10-21 ENCOUNTER — Telehealth: Payer: Self-pay | Admitting: Family Medicine

## 2022-10-21 DIAGNOSIS — K5900 Constipation, unspecified: Secondary | ICD-10-CM | POA: Insufficient documentation

## 2022-10-21 DIAGNOSIS — D649 Anemia, unspecified: Secondary | ICD-10-CM

## 2022-10-21 DIAGNOSIS — Z66 Do not resuscitate: Secondary | ICD-10-CM | POA: Insufficient documentation

## 2022-10-21 NOTE — Assessment & Plan Note (Signed)
He wants DNR.  D/w pt.  He has yellow sheet at the house.

## 2022-10-21 NOTE — Assessment & Plan Note (Signed)
Discussed pain from neuropathy/head pain.  Taking gabapentin with some relief.  No ADE on med.  He wanted to continue as is.    Discussed home care situation.  He is considering options.  His preference is to be at home.  Palliative care is going to come to the house in the near future.    See notes on labs.  We can recheck labs here every 3 months.  Off methotrexate treatment.  Continue prednisone as is.  Family can update me if they need help getting approval/coverage for hardware at home such as a ramp or lift.

## 2022-10-21 NOTE — Assessment & Plan Note (Signed)
Flu vaccine d/w pt.   Vaccines discussed with patient. Both kids designated equally if patient were incapacitated. He wants DNR.  D/w pt.  He has yellow sheet at the house.

## 2022-10-21 NOTE — Assessment & Plan Note (Signed)
See notes on follow-up labs.  We can recheck labs every 3 months.

## 2022-10-21 NOTE — Telephone Encounter (Signed)
This kind patient is significantly deconditioned.  He needs to come in every 3 months for follow-up labs.  It is easier to get the labs done here than at hematology.  Is it possible for him to have his labs drawn under the overhang at the front of the clinic so that he did not have to get out of the car and limit his exposure in the clinic?  Please let me know.  Many thanks.

## 2022-10-21 NOTE — Assessment & Plan Note (Signed)
Both kids designated equally if patient were incapacitated.

## 2022-10-21 NOTE — Assessment & Plan Note (Signed)
Continue using miralax prn for constipation and hard stools.

## 2022-10-23 NOTE — Telephone Encounter (Signed)
Patient scheduled.

## 2022-10-23 NOTE — Telephone Encounter (Signed)
Please update patient.    If he only needs labs, then he can get his blood drawn at the front under the awning (ie in his car).    He just needs to tell us when he makes an appt for [place] to go out to the car to draw his blood.    Whoever brings him to the lab appointment can come inside to check him in.  The orders are in.  Please schedule lab visit in about 3 months.    Thanks.

## 2022-12-02 ENCOUNTER — Telehealth: Payer: Self-pay

## 2022-12-02 ENCOUNTER — Other Ambulatory Visit (INDEPENDENT_AMBULATORY_CARE_PROVIDER_SITE_OTHER): Payer: Medicare PPO

## 2022-12-02 DIAGNOSIS — D649 Anemia, unspecified: Secondary | ICD-10-CM

## 2022-12-02 LAB — COMPREHENSIVE METABOLIC PANEL
ALT: 41 U/L (ref 0–53)
AST: 27 U/L (ref 0–37)
Albumin: 3.4 g/dL — ABNORMAL LOW (ref 3.5–5.2)
Alkaline Phosphatase: 101 U/L (ref 39–117)
BUN: 21 mg/dL (ref 6–23)
CO2: 27 meq/L (ref 19–32)
Calcium: 8.5 mg/dL (ref 8.4–10.5)
Chloride: 107 meq/L (ref 96–112)
Creatinine, Ser: 1.14 mg/dL (ref 0.40–1.50)
GFR: 59.85 mL/min — ABNORMAL LOW (ref >60.00)
Glucose, Bld: 95 mg/dL (ref 70–99)
Potassium: 4.1 meq/L (ref 3.5–5.1)
Sodium: 141 meq/L (ref 135–145)
Total Bilirubin: 1.1 mg/dL (ref 0.2–1.2)
Total Protein: 6.1 g/dL (ref 6.0–8.3)

## 2022-12-02 LAB — CBC WITH DIFFERENTIAL/PLATELET
Basophils Absolute: 0 10*3/uL (ref 0.0–0.1)
Basophils Relative: 0.3 % (ref 0.0–3.0)
Eosinophils Absolute: 0.1 10*3/uL (ref 0.0–0.7)
Eosinophils Relative: 1.7 % (ref 0.0–5.0)
HCT: 24.2 % — ABNORMAL LOW (ref 39.0–52.0)
Hemoglobin: 7.9 g/dL — CL (ref 13.0–17.0)
Lymphocytes Relative: 82.8 % — ABNORMAL HIGH (ref 12.0–46.0)
Lymphs Abs: 4.9 10*3/uL — ABNORMAL HIGH (ref 0.7–4.0)
MCHC: 32.7 g/dL (ref 30.0–36.0)
MCV: 123.1 fL — ABNORMAL HIGH (ref 78.0–100.0)
Monocytes Absolute: 0.3 10*3/uL (ref 0.1–1.0)
Monocytes Relative: 4.4 % (ref 3.0–12.0)
Neutro Abs: 0.6 10*3/uL — ABNORMAL LOW (ref 1.4–7.7)
Neutrophils Relative %: 10.8 % — ABNORMAL LOW (ref 43.0–77.0)
Platelets: 249 10*3/uL (ref 150.0–400.0)
RBC: 1.97 Mil/uL — ABNORMAL LOW (ref 4.22–5.81)
RDW: 20 % — ABNORMAL HIGH (ref 11.5–15.5)
WBC: 5.9 10*3/uL (ref 4.0–10.5)

## 2022-12-02 NOTE — Telephone Encounter (Signed)
This is stable with his baseline hemoglobin around 8.  I will await the full panel report and we can address that.  Thanks.

## 2022-12-02 NOTE — Telephone Encounter (Addendum)
Raina with [facility] called critical lab report hct 24.2 and hgb 7.9 sending note to Dr Para March who is out of office but looking at desktop   and [place]. Since last lab result and hgb below 8 also sending note to Dr Milinda Antis who is in office.Spoke with AV CMA.logged in lab notebook also.

## 2022-12-03 ENCOUNTER — Other Ambulatory Visit: Payer: Self-pay | Admitting: Hematology and Oncology

## 2022-12-03 ENCOUNTER — Telehealth: Payer: Self-pay

## 2022-12-03 ENCOUNTER — Other Ambulatory Visit: Payer: Self-pay | Admitting: Family Medicine

## 2022-12-03 DIAGNOSIS — D539 Nutritional anemia, unspecified: Secondary | ICD-10-CM

## 2022-12-03 DIAGNOSIS — D649 Anemia, unspecified: Secondary | ICD-10-CM

## 2022-12-03 NOTE — Telephone Encounter (Signed)
Called and scheduled appt for tomorrow for lab at 0815, see Dr. Bertis Ruddy and blood transfusion. Darl Pikes daughter is aware of appt.

## 2022-12-04 ENCOUNTER — Inpatient Hospital Stay: Payer: Medicare PPO

## 2022-12-04 ENCOUNTER — Inpatient Hospital Stay: Payer: Medicare PPO | Admitting: Hematology and Oncology

## 2022-12-04 ENCOUNTER — Inpatient Hospital Stay: Payer: Medicare PPO | Attending: Hematology and Oncology

## 2022-12-04 ENCOUNTER — Telehealth: Payer: Self-pay | Admitting: Family Medicine

## 2022-12-04 ENCOUNTER — Encounter: Payer: Self-pay | Admitting: Hematology and Oncology

## 2022-12-04 VITALS — BP 95/58 | HR 88 | Temp 97.7°F | Resp 18

## 2022-12-04 DIAGNOSIS — Z7982 Long term (current) use of aspirin: Secondary | ICD-10-CM | POA: Diagnosis not present

## 2022-12-04 DIAGNOSIS — Z7189 Other specified counseling: Secondary | ICD-10-CM | POA: Diagnosis not present

## 2022-12-04 DIAGNOSIS — D539 Nutritional anemia, unspecified: Secondary | ICD-10-CM

## 2022-12-04 DIAGNOSIS — R0602 Shortness of breath: Secondary | ICD-10-CM | POA: Diagnosis not present

## 2022-12-04 DIAGNOSIS — E861 Hypovolemia: Secondary | ICD-10-CM | POA: Diagnosis not present

## 2022-12-04 DIAGNOSIS — Z79899 Other long term (current) drug therapy: Secondary | ICD-10-CM | POA: Insufficient documentation

## 2022-12-04 DIAGNOSIS — C91Z0 Other lymphoid leukemia not having achieved remission: Secondary | ICD-10-CM | POA: Insufficient documentation

## 2022-12-04 DIAGNOSIS — R5383 Other fatigue: Secondary | ICD-10-CM | POA: Diagnosis not present

## 2022-12-04 DIAGNOSIS — I959 Hypotension, unspecified: Secondary | ICD-10-CM | POA: Insufficient documentation

## 2022-12-04 LAB — CBC WITH DIFFERENTIAL/PLATELET
Abs Immature Granulocytes: 0.02 10*3/uL (ref 0.00–0.07)
Basophils Absolute: 0 10*3/uL (ref 0.0–0.1)
Basophils Relative: 0 %
Eosinophils Absolute: 0.1 10*3/uL (ref 0.0–0.5)
Eosinophils Relative: 1 %
HCT: 22.9 % — ABNORMAL LOW (ref 39.0–52.0)
Hemoglobin: 7.7 g/dL — ABNORMAL LOW (ref 13.0–17.0)
Immature Granulocytes: 0 %
Lymphocytes Relative: 82 %
Lymphs Abs: 3.9 10*3/uL (ref 0.7–4.0)
MCH: 41 pg — ABNORMAL HIGH (ref 26.0–34.0)
MCHC: 33.6 g/dL (ref 30.0–36.0)
MCV: 121.8 fL — ABNORMAL HIGH (ref 80.0–100.0)
Monocytes Absolute: 0.3 10*3/uL (ref 0.1–1.0)
Monocytes Relative: 5 %
Neutro Abs: 0.6 10*3/uL — ABNORMAL LOW (ref 1.7–7.7)
Neutrophils Relative %: 12 %
Platelets: 220 10*3/uL (ref 150–400)
RBC: 1.88 MIL/uL — ABNORMAL LOW (ref 4.22–5.81)
RDW: 18.7 % — ABNORMAL HIGH (ref 11.5–15.5)
WBC: 4.9 10*3/uL (ref 4.0–10.5)
nRBC: 0 % (ref 0.0–0.2)

## 2022-12-04 LAB — SAMPLE TO BLOOD BANK

## 2022-12-04 LAB — PREPARE RBC (CROSSMATCH)

## 2022-12-04 MED ORDER — ACETAMINOPHEN 325 MG PO TABS
650.0000 mg | ORAL_TABLET | Freq: Once | ORAL | Status: AC
  Administered 2022-12-04: 650 mg via ORAL
  Filled 2022-12-04: qty 2

## 2022-12-04 MED ORDER — SODIUM CHLORIDE 0.9% IV SOLUTION
250.0000 mL | INTRAVENOUS | Status: DC
  Administered 2022-12-04: 100 mL via INTRAVENOUS

## 2022-12-04 NOTE — Assessment & Plan Note (Signed)
He is profoundly symptomatic today with anemia He is hypotensive We discussed some of the risks, benefits, and alternatives of blood transfusions. The patient is symptomatic from anemia and the hemoglobin level is critically low.  Some of the side-effects to be expected including risks of transfusion reactions, chills, infection, syndrome of volume overload and risk of hospitalization from various reasons and the patient is willing to proceed and went ahead to sign consent today. We will proceed with 1 unit of blood transfusion today I anticipate he might need blood transfusion every 2 to 3 weeks For future appointment, we discussed the possibility of him continuing blood count monitoring with his local primary care doctor and see me for blood transfusion versus preemptive scheduling another appointment with transfusion in 3 weeks versus referring him to see a local hematologist at [facility] to continue blood transfusion and monitoring to be closer to home His daughter will think about it and call me next week with final decision

## 2022-12-04 NOTE — Telephone Encounter (Signed)
Pt daughter Darl Pikes called in stated that she would like a call back regarding a ramp at the pt house to get in and out the house Darl Pikes would like a call back if possible Darl Pikes can be reached at [phone]

## 2022-12-04 NOTE — Assessment & Plan Note (Signed)
We had numerous goals of care discussions in the past He is getting more 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 We discussed future follow-up and consideration for referral to see a local hematologist to facilitate palliative blood transfusion

## 2022-12-04 NOTE — Patient Instructions (Signed)
Blood Transfusion, Adult A blood transfusion is a procedure in which you receive blood or a type of blood cell (blood component) through an IV. You may need a blood transfusion when you have a low blood count, which is a low number of any blood cell. This may result from a bleeding disorder, illness, injury, or surgery. The blood may come from a donor, or you may be able to have your own blood collected and stored (autologous blood donation) before a planned surgery. The blood given in a transfusion may be made up of different blood components. You may receive: Red blood cells. These carry oxygen to the cells in the body. Platelets. These help your blood to clot. Plasma. This is the liquid part of your blood. It carries proteins and other substances throughout the body. White blood cells. These help you fight infections. If you have hemophilia or another clotting disorder, you may also receive other types of blood products. Depending on the type of blood product, this procedure may take 1-4 hours to complete. Tell a health care provider about: Any bleeding problems you have. Any previous reactions you have had during a blood transfusion. Any allergies you have. All medicines you are taking, including vitamins, herbs, eye drops, creams, and over-the-counter medicines. Any surgeries you have had. Any medical conditions you have. Whether you are pregnant or may be pregnant. What are the risks? Talk with your health care provider about risks. The most common problems include: A mild allergic reaction, such as red, swollen areas of skin (hives) and itching. Fever or chills. This may be the body's response to new blood cells received. This may occur during or up to 4 hours after the transfusion. More serious problems may include: A serious allergic reaction that causes difficulty breathing or swelling around the face and lips. Transfusion-associated circulatory overload (TACO), or too much fluid in  the lungs. This may cause breathing problems. Transfusion-related acute lung injury (TRALI), which causes breathing difficulty and low oxygen in the blood. This can occur within hours of the transfusion or several days later. Iron overload. This can happen after receiving many blood transfusions over a period of time. Infection or virus being transmitted. This is rare because donated blood is carefully tested before it is given. Hemolytic transfusion reaction. This is rare. It happens when the body's defense system (immune system)tries to attack the new blood cells. Symptoms may include fever, chills, nausea, low blood pressure, and low back or chest pain. Transfusion-associated graft-versus-host disease (TAGVHD). This is rare. It happens when donated cells attack the body's healthy tissues. What happens before the procedure? You will have a blood test to check your blood type. This test is done to know what kind of blood your body will accept and to match it to the donor blood. If you are going to have a planned surgery, you may be able to do an autologous blood donation. This may be done in case you need to have a transfusion. You will have your temperature, blood pressure, and pulse checked before the transfusion. If you have had an allergic reaction to a transfusion in the past, you may be given medicine to help prevent a reaction. This medicine may be given to you by mouth (orally) or through an IV. What happens during the procedure?  An IV will be inserted into one of your veins. The bag of blood will be attached to your IV. The blood will then enter through your vein. Your temperature, blood pressure,   and pulse will be monitored during the transfusion. This monitoring is done to detect early signs of a transfusion reaction. Tell your nurse right away if you have any of these symptoms during the transfusion: Shortness of breath or trouble breathing. Chest or back pain. Fever or  chills. Itching or hives. If you have any signs or symptoms of a reaction, your transfusion will be stopped and you may be given medicine. When the transfusion is complete, your IV will be removed. Pressure may be applied to the IV site for a few minutes. A bandage (dressing)will be applied. The procedure may vary among health care providers and hospitals. What happens after the procedure? Your temperature, blood pressure, pulse, breathing rate, and blood oxygen level will be monitored until you leave the hospital or clinic. Your blood may be tested to see how you have responded to the transfusion. You may be warmed with fluids or blankets to maintain a normal body temperature. If you receive your blood transfusion in an outpatient setting, you will be told whom to contact to report any reactions. Where to find more information Visit the [employer]: redcross.org Summary A blood transfusion is a procedure in which you receive blood or a type of blood cell (blood component) through an IV. The blood given in a transfusion may be made up of different blood components. You may receive red blood cells, platelets, plasma, or white blood cells depending on the condition treated. Your temperature, blood pressure, and pulse will be monitored before, during, and after the transfusion. After the transfusion, your blood may be tested to see how your body has responded. This information is not intended to replace advice given to you by your health care provider. Make sure you discuss any questions you have with your health care provider. Document Revised: 04/18/2021 Document Reviewed: 04/18/2021 Elsevier Patient Education  2024 Elsevier Inc.  

## 2022-12-04 NOTE — Progress Notes (Signed)
[facility] OFFICE PROGRESS NOTE  Patient Care Team: Joaquim Nam, MD as PCP - General (Family Medicine) Dingeldein, Viviann Spare, MD as Referring Physician (Ophthalmology) Wenda Overland, [place] as Social Worker  ASSESSMENT & PLAN:  Macrocytic anemia He is profoundly symptomatic today with anemia He is hypotensive We discussed some of the risks, benefits, and alternatives of blood transfusions. The patient is symptomatic from anemia and the hemoglobin level is critically low.  Some of the side-effects to be expected including risks of transfusion reactions, chills, infection, syndrome of volume overload and risk of hospitalization from various reasons and the patient is willing to proceed and went ahead to sign consent today. We will proceed with 1 unit of blood transfusion today I anticipate he might need blood transfusion every 2 to 3 weeks For future appointment, we discussed the possibility of him continuing blood count monitoring with his local primary care doctor and see me for blood transfusion versus preemptive scheduling another appointment with transfusion in 3 weeks versus referring him to see a local hematologist at [facility] to continue blood transfusion and monitoring to be closer to home His daughter will think about it and call me next week with final decision  Hypotension He is profoundly hypotensive today He is symptomatic He is not on any antihypertensives 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 I suspect his hypotension was because by profound anemia and I am hopeful that the blood transfusion might help I do not believe he needs any further cardiology review  Goals of care, counseling/discussion We had numerous goals of care discussions in the past He is getting more 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 We discussed future follow-up and consideration for referral to see a local hematologist  to facilitate palliative blood transfusion  Orders Placed This Encounter  Procedures   Informed Consent Details: Physician/Practitioner Attestation; Transcribe to consent form and obtain patient signature    Standing Status:   Future    Standing Expiration Date:   12/04/2023    Order Specific Question:   Physician/Practitioner attestation of informed consent for blood and or blood product transfusion    Answer:   I, the physician/practitioner, attest that I have discussed with the patient the benefits, risks, side effects, alternatives, likelihood of achieving goals and potential problems during recovery for the procedure that I have provided informed consent.    Order Specific Question:   Product(s)    Answer:   All Product(s)   Care order/instruction    Transfuse Parameters    Standing Status:   Future    Number of Occurrences:   1    Standing Expiration Date:   12/04/2023   EKG 12-Lead    Ordered by an unspecified provider    Type and screen         Standing Status:   Future    Number of Occurrences:   1    Standing Expiration Date:   12/04/2023   Prepare RBC (crossmatch)    Standing Status:   Standing    Number of Occurrences:   1    Order Specific Question:   # of Units    Answer:   1 unit    Order Specific Question:   Transfusion Indications    Answer:   Hemoglobin 8 gm/dL or less and orthopedic or cardiac surgery or pre-existing cardiac condition    Order Specific Question:   Number of Units to Keep  Ahead    Answer:   NO units ahead    Order Specific Question:   Instructions:    Answer:   Transfuse    Order Specific Question:   If emergent release call blood bank    Answer:   Not emergent release    All questions were answered. The patient knows to call the clinic with any problems, questions or concerns. The total time spent in the appointment was 40 minutes encounter with patients including review of chart and various tests results, discussions about plan of care and  coordination of care plan   Artis Delay, MD 12/04/2022 10:06 AM  INTERVAL HISTORY: Please see below for problem oriented charting. he returns for urgent evaluation I was notified by his primary care doctor about his recent progression of anemia He is here accompanied by his daughter He has been complaining of fatigue, generalized weakness and shortness of breath with exertion No chest pain, dizziness or recurrent falls The patient denies any recent signs or symptoms of bleeding such as spontaneous epistaxis, hematuria or hematochezia. We discussed test results, risk, benefits, alternative to transfusion today  REVIEW OF SYSTEMS:   Constitutional: Denies fevers, chills or abnormal weight loss Eyes: Denies blurriness of vision Ears, nose, mouth, throat, and face: Denies mucositis or sore throat Cardiovascular: Denies palpitation, chest discomfort or lower extremity swelling Gastrointestinal:  Denies nausea, heartburn or change in bowel habits Skin: Denies abnormal skin rashes Lymphatics: Denies new lymphadenopathy or easy bruising Behavioral/Psych: Mood is stable, no new changes  All other systems were reviewed with the patient and are negative.  I have reviewed the past medical history, past surgical history, social history and family history with the patient and they are unchanged from previous note.  ALLERGIES:  is allergic to sulfa antibiotics.  MEDICATIONS:  Current Outpatient Medications  Medication Sig Dispense Refill   acetaminophen (TYLENOL) 325 MG tablet Take 650 mg by mouth every 6 (six) hours as needed.     aspirin EC 81 MG tablet Take 162 mg by mouth daily.     CALCIUM CARB-CHOLECALCIFEROL PO Take 1,250 mg by mouth daily.     Cyanocobalamin (VITAMIN B-12) 500 MCG SUBL Place 1 tablet under the tongue daily.      folic acid (FOLVITE) 400 MCG tablet Take 400 mcg by mouth daily.     gabapentin (NEURONTIN) 300 MG capsule TAKE 2 CAPSULES BY MOUTH 2 TIMES DAILY. 360 capsule 3    Multiple Vitamin (MULTIVITAMIN) tablet Take 1 tablet by mouth daily.       polyethylene glycol powder (GLYCOLAX/MIRALAX) 17 GM/SCOOP powder Take 17 g by mouth daily as needed.     predniSONE (DELTASONE) 5 MG tablet TAKE 1 TABLET BY MOUTH ON MONDAYS, WEDS, AND FRIDAYS, AND TAKE 2 TABS OTHER DAYS OF THE WEEK 90 tablet 1   timolol (TIMOPTIC) 0.5 % ophthalmic solution Place 1 drop into both eyes at bedtime. One drop in each eye at bedtime.     No current facility-administered medications for this visit.   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 T-cell receptor rearrangement study is still pending  12/19/2015 - 08/03/2022 Chemotherapy   The patient was started on 10 mg once a week methotrexate. The dose is increased to 12.5 mg starting 01/16/16, subsequently discontinued due to frail status and worsening anemia      PHYSICAL EXAMINATION: ECOG PERFORMANCE STATUS: 2 - Symptomatic, <50% confined to bed  Vitals:   12/04/22 0943  BP: (!) 95/58  Pulse: 88  Resp: 18  Temp: 97.7 F (36.5 C)  SpO2: (!) 8%   There were no vitals filed for this visit.  GENERAL:alert, no distress and comfortable SKIN: skin color, texture, turgor are normal, no rashes or significant lesions EYES: normal, Conjunctiva are pink and non-injected, sclera clear OROPHARYNX:no exudate, no erythema and lips, buccal mucosa, and tongue normal  NECK: supple, thyroid normal size, non-tender, without nodularity LYMPH:  no palpable lymphadenopathy in the cervical, axillary or inguinal LUNGS: clear to auscultation and percussion with normal  breathing effort HEART: occasional irregular rate & rhythm and no murmurs ABDOMEN:abdomen soft, non-tender and normal bowel sounds Musculoskeletal:no cyanosis of digits and no clubbing  NEURO: alert & oriented x 3 with fluent speech, no focal motor/sensory deficits  LABORATORY DATA:  I have reviewed the data as listed    Component Value Date/Time   NA 141 12/02/2022 0841   NA 138 12/15/2016 1319   K 4.1 12/02/2022 0841   K 4.1 12/15/2016 1319   CL 107 12/02/2022 0841   CO2 27 12/02/2022 0841   CO2 24 12/15/2016 1319   GLUCOSE 95 12/02/2022 0841   GLUCOSE 123 12/15/2016 1319   BUN 21 12/02/2022 0841   BUN 13.7 12/15/2016 1319   CREATININE 1.14 12/02/2022 0841   CREATININE 1.2 12/15/2016 1319   CALCIUM 8.5 12/02/2022 0841   CALCIUM 8.8 12/15/2016 1319   PROT 6.1 12/02/2022 0841   PROT 6.5 12/15/2016 1319   PROT 6.2 12/15/2016 1319   ALBUMIN 3.4 (L) 12/02/2022 0841   ALBUMIN 3.5 12/15/2016 1319   AST 27 12/02/2022 0841   AST 66 (H) 12/15/2016 1319   ALT 41 12/02/2022 0841   ALT 90 (H) 12/15/2016 1319   ALKPHOS 101 12/02/2022 0841   ALKPHOS 116 12/15/2016 1319   BILITOT 1.1 12/02/2022 0841   BILITOT 1.13 12/15/2016 1319   GFRNONAA >60 06/18/2022 1046   GFRAA >60 06/27/2019 0857    No results found for: "SPEP", "UPEP"  Lab Results  Component Value Date   WBC 4.9 12/04/2022   NEUTROABS 0.6 (L) 12/04/2022   HGB 7.7 (L) 12/04/2022   HCT 22.9 (L) 12/04/2022   MCV 121.8 (H) 12/04/2022   PLT 220 12/04/2022      Chemistry      Component Value Date/Time   NA 141 12/02/2022 0841   NA 138 12/15/2016 1319   K 4.1 12/02/2022 0841   K 4.1 12/15/2016 1319   CL 107 12/02/2022 0841   CO2 27 12/02/2022 0841   CO2 24 12/15/2016 1319   BUN 21 12/02/2022 0841   BUN 13.7 12/15/2016 1319   CREATININE 1.14 12/02/2022 0841   CREATININE 1.2 12/15/2016 1319      Component Value Date/Time   CALCIUM 8.5 12/02/2022 0841   CALCIUM 8.8 12/15/2016 1319   ALKPHOS 101 12/02/2022 0841    ALKPHOS 116 12/15/2016 1319   AST 27 12/02/2022 0841   AST 66 (H) 12/15/2016 1319   ALT 41 12/02/2022 0841   ALT 90 (H) 12/15/2016 1319   BILITOT 1.1 12/02/2022 0841   BILITOT 1.13 12/15/2016 1319

## 2022-12-04 NOTE — Assessment & Plan Note (Signed)
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

## 2022-12-04 NOTE — Telephone Encounter (Signed)
Spoke with patients daughter who was wondering if we provided orders for ramps. Did advise typically no but did refer her to the patients insurance to inquire as well.

## 2022-12-05 LAB — TYPE AND SCREEN
ABO/RH(D): O POS
Antibody Screen: NEGATIVE
Unit division: 0

## 2022-12-05 LAB — BPAM RBC
Blood Product Expiration Date: 202411262359
ISSUE DATE / TIME: 202411011018
Unit Type and Rh: 5100

## 2022-12-09 ENCOUNTER — Other Ambulatory Visit: Payer: Self-pay | Admitting: Hematology and Oncology

## 2022-12-09 ENCOUNTER — Telehealth: Payer: Self-pay | Admitting: Hematology and Oncology

## 2022-12-09 NOTE — Telephone Encounter (Signed)
Unable to reach patients daughter. Left voicemail to return call to our office.   

## 2022-12-09 NOTE — Telephone Encounter (Signed)
If his insurance will cover it, then I can write the order.  Please let me know if/when they find out they need an order.

## 2022-12-09 NOTE — Telephone Encounter (Signed)
Spoke with patient confirming upcoming appointment  

## 2022-12-10 NOTE — Telephone Encounter (Signed)
Received returned phone call from patients daughter Darl Pikes. They have not made any head way with insurance in reference to the ramp. They are in communication with some local churches in the meantime.   She wanted me to let you know that [facility] is coming in for a home visit on 11/12 and she may need an rx for a bed if that can be done.

## 2022-12-11 NOTE — Telephone Encounter (Signed)
If pt and family agree, please send DME order for adjustable bed.  Dx G72.0, R53.81, G61.81.  thanks.

## 2022-12-11 NOTE — Telephone Encounter (Signed)
Left voicemail for patient to return call to office. 

## 2022-12-14 NOTE — Telephone Encounter (Signed)
Called and spoke with daughter Darl Pikes and advised that Dr. Para March is agreeable to doing the order for DME when she is ready.

## 2022-12-15 ENCOUNTER — Other Ambulatory Visit: Payer: Self-pay

## 2022-12-15 ENCOUNTER — Telehealth: Payer: Self-pay | Admitting: Family Medicine

## 2022-12-15 DIAGNOSIS — R5381 Other malaise: Secondary | ICD-10-CM

## 2022-12-15 DIAGNOSIS — G720 Drug-induced myopathy: Secondary | ICD-10-CM

## 2022-12-15 DIAGNOSIS — G6181 Chronic inflammatory demyelinating polyneuritis: Secondary | ICD-10-CM

## 2022-12-15 NOTE — Telephone Encounter (Signed)
Order is in the system for the hospital bed but they are also requesting a power wheelchair. Are you okay with that as well. Please adivse.

## 2022-12-15 NOTE — Telephone Encounter (Signed)
error 

## 2022-12-15 NOTE — Telephone Encounter (Signed)
Asia from [facility] called stating that daughter is ready for orders for hospital bed and transport wheelchair to be sent out.

## 2022-12-16 NOTE — Telephone Encounter (Addendum)
Spoke with daughter and the order is for a hospital bed and transport wheelchair. Not a power wheelchair. They just need something else light weight. The DME provider is [employer] they only use a website called parachute for DME orders. I have placed all info in and it stops me at a face to face. It says a face to face appointment is required to proceed do you want me to schedule?

## 2022-12-16 NOTE — Telephone Encounter (Signed)
Please explain to the patient/family that I will do what ever I can to try to help him get what ever hardware he needs.  Getting coverage for a power wheelchair will almost certainly require an office visit.  This is not because I have any question about his eligibility.  It is about getting all of the insurance requirements met to get coverage for him so that he does not have to pay an exorbitant amount out-of-pocket.  This is not my preference.  Please see what company they want to use and see about getting the forms from that company ahead of time so that I can start working on it.  Thanks.

## 2022-12-16 NOTE — Telephone Encounter (Signed)
Please use the OV note from 10/19/22.  I updated that with the information that he would benefit from use of a lightweight wheelchair for transport.

## 2022-12-21 ENCOUNTER — Other Ambulatory Visit: Payer: Self-pay

## 2022-12-21 DIAGNOSIS — R5381 Other malaise: Secondary | ICD-10-CM

## 2022-12-21 DIAGNOSIS — G6181 Chronic inflammatory demyelinating polyneuritis: Secondary | ICD-10-CM

## 2022-12-21 DIAGNOSIS — G720 Drug-induced myopathy: Secondary | ICD-10-CM

## 2022-12-24 ENCOUNTER — Inpatient Hospital Stay: Payer: Medicare PPO | Admitting: Hematology and Oncology

## 2022-12-24 ENCOUNTER — Inpatient Hospital Stay: Payer: Medicare PPO

## 2022-12-24 ENCOUNTER — Telehealth: Payer: Self-pay | Admitting: Family Medicine

## 2022-12-24 ENCOUNTER — Encounter: Payer: Self-pay | Admitting: Hematology and Oncology

## 2022-12-24 VITALS — BP 110/69 | HR 66 | Temp 98.0°F | Resp 18 | Ht 76.0 in | Wt 185.8 lb

## 2022-12-24 DIAGNOSIS — R0602 Shortness of breath: Secondary | ICD-10-CM | POA: Diagnosis not present

## 2022-12-24 DIAGNOSIS — D539 Nutritional anemia, unspecified: Secondary | ICD-10-CM

## 2022-12-24 DIAGNOSIS — C91Z0 Other lymphoid leukemia not having achieved remission: Secondary | ICD-10-CM

## 2022-12-24 DIAGNOSIS — Z79899 Other long term (current) drug therapy: Secondary | ICD-10-CM | POA: Diagnosis not present

## 2022-12-24 DIAGNOSIS — Z7982 Long term (current) use of aspirin: Secondary | ICD-10-CM | POA: Diagnosis not present

## 2022-12-24 DIAGNOSIS — I959 Hypotension, unspecified: Secondary | ICD-10-CM | POA: Diagnosis not present

## 2022-12-24 DIAGNOSIS — R5383 Other fatigue: Secondary | ICD-10-CM | POA: Diagnosis not present

## 2022-12-24 LAB — SAMPLE TO BLOOD BANK

## 2022-12-24 LAB — CBC WITH DIFFERENTIAL/PLATELET
Abs Immature Granulocytes: 0.04 10*3/uL (ref 0.00–0.07)
Basophils Absolute: 0 10*3/uL (ref 0.0–0.1)
Basophils Relative: 1 %
Eosinophils Absolute: 0.2 10*3/uL (ref 0.0–0.5)
Eosinophils Relative: 3 %
HCT: 25.8 % — ABNORMAL LOW (ref 39.0–52.0)
Hemoglobin: 8.5 g/dL — ABNORMAL LOW (ref 13.0–17.0)
Immature Granulocytes: 1 %
Lymphocytes Relative: 81 %
Lymphs Abs: 4.8 10*3/uL — ABNORMAL HIGH (ref 0.7–4.0)
MCH: 37.8 pg — ABNORMAL HIGH (ref 26.0–34.0)
MCHC: 32.9 g/dL (ref 30.0–36.0)
MCV: 114.7 fL — ABNORMAL HIGH (ref 80.0–100.0)
Monocytes Absolute: 0.3 10*3/uL (ref 0.1–1.0)
Monocytes Relative: 5 %
Neutro Abs: 0.6 10*3/uL — ABNORMAL LOW (ref 1.7–7.7)
Neutrophils Relative %: 9 %
Platelets: 255 10*3/uL (ref 150–400)
RBC: 2.25 MIL/uL — ABNORMAL LOW (ref 4.22–5.81)
RDW: 26.1 % — ABNORMAL HIGH (ref 11.5–15.5)
Smear Review: NORMAL
WBC: 5.9 10*3/uL (ref 4.0–10.5)
nRBC: 0 % (ref 0.0–0.2)

## 2022-12-24 NOTE — Assessment & Plan Note (Signed)
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

## 2022-12-24 NOTE — Progress Notes (Signed)
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 He is actually not symptomatic today His blood pressure is stable and his blood count looks great It is difficult to predict how frequent he would need future blood transfusion support I recommend the patient to reach out to his primary care doctor and have repeat CBC done in about 2 weeks If his hemoglobin is less than 8 again, we will arrange blood transfusion support I have sent his primary care doctor a message and his daughter will also reach out to the clinic for follow-up appointment  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HCC) From our previous discussion, we are in agreement not to pursue further therapy With discontinuation of methotrexate, his hemoglobin has improved  No orders of the defined types were placed in this encounter.   All questions were answered. The patient knows to call the clinic with any problems, questions or concerns. The total time spent in the appointment was 20 minutes encounter with patients including review of chart and various tests results, discussions about plan of care and coordination of care plan   Artis Delay, MD 12/24/2022 9:08 AM  INTERVAL HISTORY: Please see below for problem oriented charting. he returns for surveillance follow-up and possibility of blood transfusion support He is here accompanied by his daughter He is feeling okay Denies signs or symptoms of anemia No recent bleeding We discussed test results and future follow-up  REVIEW OF SYSTEMS:   Constitutional: Denies fevers, chills or abnormal weight loss Eyes: Denies blurriness of vision Ears, nose, mouth, throat, and face:  Denies mucositis or sore throat Respiratory: Denies cough, dyspnea or wheezes Cardiovascular: Denies palpitation, chest discomfort or lower extremity swelling Gastrointestinal:  Denies nausea, heartburn or change in bowel habits Skin: Denies abnormal skin rashes Lymphatics: Denies new lymphadenopathy or easy bruising Neurological:Denies numbness, tingling or new weaknesses Behavioral/Psych: Mood is stable, no new changes  All other systems were reviewed with the patient and are negative.  I have reviewed the past medical history, past surgical history, social history and family history with the patient and they are unchanged from previous note.  ALLERGIES:  is allergic to sulfa antibiotics.  MEDICATIONS:  Current Outpatient Medications  Medication Sig Dispense Refill   acetaminophen (TYLENOL) 325 MG tablet Take 650 mg by mouth every 6 (six) hours as needed.     aspirin EC 81 MG tablet Take 162 mg by mouth daily.     CALCIUM CARB-CHOLECALCIFEROL PO Take 1,250 mg by mouth daily.     Cyanocobalamin (VITAMIN B-12) 500 MCG SUBL Place 1 tablet under the tongue daily.      folic acid (FOLVITE) 400 MCG tablet Take 400 mcg by mouth daily.     gabapentin (NEURONTIN) 300 MG capsule TAKE 2 CAPSULES BY MOUTH 2 TIMES DAILY. 360 capsule 3   Multiple Vitamin (MULTIVITAMIN) tablet Take 1 tablet by mouth daily.       polyethylene glycol powder (GLYCOLAX/MIRALAX) 17 GM/SCOOP powder Take 17 g by mouth daily as needed.     predniSONE (DELTASONE) 5 MG tablet TAKE 1 TABLET BY MOUTH ON MONDAYS, WEDS, AND FRIDAYS, AND TAKE 2 TABS OTHER DAYS OF THE WEEK 90 tablet 1   timolol (TIMOPTIC) 0.5 % ophthalmic solution  Place 1 drop into both eyes at bedtime. One drop in each eye at bedtime.     No current facility-administered medications for this visit.    SUMMARY OF ONCOLOGIC HISTORY: Oncology History  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HCC)  12/11/2015 Bone Marrow Biopsy   Bone marrow biopsy confirmed diagnosis  of Tcell LGL. T-cell receptor rearrangement study is still pending   12/19/2015 - 08/03/2022 Chemotherapy   The patient was started on 10 mg once a week methotrexate. The dose is increased to 12.5 mg starting 01/16/16, subsequently discontinued due to frail status and worsening anemia      PHYSICAL EXAMINATION: ECOG PERFORMANCE STATUS: 1 - Symptomatic but completely ambulatory  Vitals:   12/24/22 0836  BP: 110/69  Pulse: 66  Resp: 18  Temp: 98 F (36.7 C)  SpO2: 97%   Filed Weights   12/24/22 0836  Weight: 185 lb 12.8 oz (84.3 kg)    GENERAL:alert, no distress and comfortable  LABORATORY DATA:  I have reviewed the data as listed    Component Value Date/Time   NA 141 12/02/2022 0841   NA 138 12/15/2016 1319   K 4.1 12/02/2022 0841   K 4.1 12/15/2016 1319   CL 107 12/02/2022 0841   CO2 27 12/02/2022 0841   CO2 24 12/15/2016 1319   GLUCOSE 95 12/02/2022 0841   GLUCOSE 123 12/15/2016 1319   BUN 21 12/02/2022 0841   BUN 13.7 12/15/2016 1319   CREATININE 1.14 12/02/2022 0841   CREATININE 1.2 12/15/2016 1319   CALCIUM 8.5 12/02/2022 0841   CALCIUM 8.8 12/15/2016 1319   PROT 6.1 12/02/2022 0841   PROT 6.5 12/15/2016 1319   PROT 6.2 12/15/2016 1319   ALBUMIN 3.4 (L) 12/02/2022 0841   ALBUMIN 3.5 12/15/2016 1319   AST 27 12/02/2022 0841   AST 66 (H) 12/15/2016 1319   ALT 41 12/02/2022 0841   ALT 90 (H) 12/15/2016 1319   ALKPHOS 101 12/02/2022 0841   ALKPHOS 116 12/15/2016 1319   BILITOT 1.1 12/02/2022 0841   BILITOT 1.13 12/15/2016 1319   GFRNONAA >60 06/18/2022 1046   GFRAA >60 06/27/2019 0857    No results found for: "SPEP", "UPEP"  Lab Results  Component Value Date   WBC 5.9 12/24/2022   NEUTROABS 0.6 (L) 12/24/2022   HGB 8.5 (L) 12/24/2022   HCT 25.8 (L) 12/24/2022   MCV 114.7 (H) 12/24/2022   PLT 255 12/24/2022      Chemistry      Component Value Date/Time   NA 141 12/02/2022 0841   NA 138 12/15/2016 1319   K 4.1 12/02/2022 0841   K 4.1  12/15/2016 1319   CL 107 12/02/2022 0841   CO2 27 12/02/2022 0841   CO2 24 12/15/2016 1319   BUN 21 12/02/2022 0841   BUN 13.7 12/15/2016 1319   CREATININE 1.14 12/02/2022 0841   CREATININE 1.2 12/15/2016 1319      Component Value Date/Time   CALCIUM 8.5 12/02/2022 0841   CALCIUM 8.8 12/15/2016 1319   ALKPHOS 101 12/02/2022 0841   ALKPHOS 116 12/15/2016 1319   AST 27 12/02/2022 0841   AST 66 (H) 12/15/2016 1319   ALT 41 12/02/2022 0841   ALT 90 (H) 12/15/2016 1319   BILITOT 1.1 12/02/2022 0841   BILITOT 1.13 12/15/2016 1319

## 2022-12-24 NOTE — Telephone Encounter (Signed)
Patient's daughter reached out regarding visit to cancer center today, states that Doctor there recommended patient repeat a CBC at pcp office within the next two weeks. This is in OV notes from 11/21 "I recommend the patient to reach out to his primary care doctor and have repeat CBC done in about 2 weeks." Patient's daughter wanted to get the okay to move up the lab appointment on 12/20 so patient can have labs drawn for Cancer center provider. Please advise if this is okay and I can move up patient's appointment

## 2022-12-24 NOTE — Assessment & Plan Note (Signed)
From our previous discussion, we are in agreement not to pursue further therapy With discontinuation of methotrexate, his hemoglobin has improved

## 2022-12-25 NOTE — Telephone Encounter (Signed)
Called daughter on dpr. Appointment scheduled for non fasting labs. Will call if any questions.

## 2022-12-25 NOTE — Telephone Encounter (Signed)
Note from Dr. Bertis Ruddy-  Would it be possible to have his labs drawn on the week of 12/1?  That could potentially save him a trip  Just a CBC   Thank you for your help  Ni Gorsuch  =======================  He has future CBC already ordered.  Please set up labs for the week of 01/03/23 here at [facility]. Thanks.

## 2023-01-05 ENCOUNTER — Other Ambulatory Visit (INDEPENDENT_AMBULATORY_CARE_PROVIDER_SITE_OTHER): Payer: Medicare PPO

## 2023-01-05 ENCOUNTER — Other Ambulatory Visit: Payer: Medicare PPO

## 2023-01-05 ENCOUNTER — Telehealth: Payer: Self-pay

## 2023-01-05 DIAGNOSIS — D649 Anemia, unspecified: Secondary | ICD-10-CM

## 2023-01-05 LAB — CBC WITH DIFFERENTIAL/PLATELET
Basophils Absolute: 0 10*3/uL (ref 0.0–0.1)
Basophils Relative: 0.3 % (ref 0.0–3.0)
Eosinophils Absolute: 0.1 10*3/uL (ref 0.0–0.7)
Eosinophils Relative: 1.5 % (ref 0.0–5.0)
HCT: 23.2 % — CL (ref 39.0–52.0)
Hemoglobin: 7.9 g/dL — CL (ref 13.0–17.0)
Lymphocytes Relative: 69.4 % — ABNORMAL HIGH (ref 12.0–46.0)
Lymphs Abs: 2.8 10*3/uL (ref 0.7–4.0)
MCHC: 34 g/dL (ref 30.0–36.0)
MCV: 115.5 fL — ABNORMAL HIGH (ref 78.0–100.0)
Monocytes Absolute: 0.1 10*3/uL (ref 0.1–1.0)
Monocytes Relative: 3.5 % (ref 3.0–12.0)
Neutro Abs: 1 10*3/uL — ABNORMAL LOW (ref 1.4–7.7)
Neutrophils Relative %: 25.3 % — ABNORMAL LOW (ref 43.0–77.0)
Platelets: 264 10*3/uL (ref 150.0–400.0)
RBC: 2.01 Mil/uL — ABNORMAL LOW (ref 4.22–5.81)
RDW: 27.6 % — ABNORMAL HIGH (ref 11.5–15.5)
WBC: 4 10*3/uL (ref 4.0–10.5)

## 2023-01-05 NOTE — Telephone Encounter (Signed)
Called and spoke with patient as well as patients daughter Jaci Standard [phone] he wanted me to call her because she is their POA.

## 2023-01-05 NOTE — Telephone Encounter (Signed)
Thank you for update I will try my best to arrange blood transfusion as soon as possible

## 2023-01-05 NOTE — Telephone Encounter (Signed)
CRITICAL VALUE STICKER  CRITICAL VALUE:  HGB 7.9  HCT 23.2   RECEIVER (on-site recipient of call): Donnamarie Poag   DATE & TIME NOTIFIED: 01/05/23 3:25 PM   MESSENGER (representative from lab): Clydie Braun   MD NOTIFIED:  Message sent to Dr. Para March as well as leaving red book with CMA to let provider know.   TIME OF NOTIFICATION:3:26 PM   RESPONSE:  will wait for provider to respond in message with further instructions.

## 2023-01-05 NOTE — Telephone Encounter (Signed)
Dr. Bertis Ruddy- Can you please help this patient with transfusion?  Many thanks.    AV-  please update patient/family about HGB 7.9 and I am asking for hematology input.  Thanks.

## 2023-01-06 NOTE — Telephone Encounter (Signed)
Greatly appreciate the help of all involved.

## 2023-01-07 ENCOUNTER — Inpatient Hospital Stay: Payer: Medicare PPO | Attending: Hematology and Oncology

## 2023-01-07 ENCOUNTER — Other Ambulatory Visit: Payer: Self-pay

## 2023-01-07 ENCOUNTER — Inpatient Hospital Stay: Payer: Medicare PPO | Admitting: Hematology and Oncology

## 2023-01-07 ENCOUNTER — Encounter: Payer: Self-pay | Admitting: Hematology and Oncology

## 2023-01-07 ENCOUNTER — Inpatient Hospital Stay: Payer: Medicare PPO

## 2023-01-07 VITALS — BP 123/72 | HR 69 | Resp 18

## 2023-01-07 DIAGNOSIS — D61818 Other pancytopenia: Secondary | ICD-10-CM | POA: Insufficient documentation

## 2023-01-07 DIAGNOSIS — R0602 Shortness of breath: Secondary | ICD-10-CM | POA: Insufficient documentation

## 2023-01-07 DIAGNOSIS — D539 Nutritional anemia, unspecified: Secondary | ICD-10-CM | POA: Diagnosis not present

## 2023-01-07 DIAGNOSIS — C91Z0 Other lymphoid leukemia not having achieved remission: Secondary | ICD-10-CM | POA: Insufficient documentation

## 2023-01-07 DIAGNOSIS — Z7982 Long term (current) use of aspirin: Secondary | ICD-10-CM | POA: Insufficient documentation

## 2023-01-07 DIAGNOSIS — Z79899 Other long term (current) drug therapy: Secondary | ICD-10-CM | POA: Diagnosis not present

## 2023-01-07 DIAGNOSIS — R42 Dizziness and giddiness: Secondary | ICD-10-CM | POA: Insufficient documentation

## 2023-01-07 LAB — CBC WITH DIFFERENTIAL/PLATELET
Abs Immature Granulocytes: 0.04 10*3/uL (ref 0.00–0.07)
Basophils Absolute: 0 10*3/uL (ref 0.0–0.1)
Basophils Relative: 1 %
Eosinophils Absolute: 0.1 10*3/uL (ref 0.0–0.5)
Eosinophils Relative: 2 %
HCT: 24.6 % — ABNORMAL LOW (ref 39.0–52.0)
Hemoglobin: 8 g/dL — ABNORMAL LOW (ref 13.0–17.0)
Immature Granulocytes: 1 %
Lymphocytes Relative: 69 %
Lymphs Abs: 2.5 10*3/uL (ref 0.7–4.0)
MCH: 38.1 pg — ABNORMAL HIGH (ref 26.0–34.0)
MCHC: 32.5 g/dL (ref 30.0–36.0)
MCV: 117.1 fL — ABNORMAL HIGH (ref 80.0–100.0)
Monocytes Absolute: 0.1 10*3/uL (ref 0.1–1.0)
Monocytes Relative: 3 %
Neutro Abs: 0.9 10*3/uL — ABNORMAL LOW (ref 1.7–7.7)
Neutrophils Relative %: 24 %
Platelets: 232 10*3/uL (ref 150–400)
RBC: 2.1 MIL/uL — ABNORMAL LOW (ref 4.22–5.81)
RDW: 25.7 % — ABNORMAL HIGH (ref 11.5–15.5)
WBC: 3.6 10*3/uL — ABNORMAL LOW (ref 4.0–10.5)
nRBC: 0 % (ref 0.0–0.2)

## 2023-01-07 LAB — SAMPLE TO BLOOD BANK

## 2023-01-07 LAB — PREPARE RBC (CROSSMATCH)

## 2023-01-07 MED ORDER — ACETAMINOPHEN 325 MG PO TABS
650.0000 mg | ORAL_TABLET | Freq: Once | ORAL | Status: AC
  Administered 2023-01-07: 650 mg via ORAL
  Filled 2023-01-07: qty 2

## 2023-01-07 MED ORDER — SODIUM CHLORIDE 0.9% IV SOLUTION
250.0000 mL | INTRAVENOUS | Status: DC
  Administered 2023-01-07: 100 mL via INTRAVENOUS

## 2023-01-07 NOTE — Progress Notes (Signed)
1U PRBC prepare order released per MD. BB notified.

## 2023-01-07 NOTE — Assessment & Plan Note (Signed)
Recent blood work showed recurrent anemia B12 and iron studies are adequate We discussed some of the risks, benefits, and alternatives of blood transfusions. The patient is symptomatic from anemia and the hemoglobin level is critically low.  Some of the side-effects to be expected including risks of transfusion reactions, chills, infection, syndrome of volume overload and risk of hospitalization from various reasons and the patient is willing to proceed and went ahead to sign consent today. He will receive a unit of blood today He is profoundly symptomatic when his hemoglobin approaches 8 Given the interval of almost 5 weeks between transfusions, I recommend bringing him back early January for possible transfusion again and he agreed

## 2023-01-07 NOTE — Progress Notes (Signed)
[facility] OFFICE PROGRESS NOTE  Patient Care Team: Joaquim Nam, MD as PCP - General (Family Medicine) Dingeldein, Viviann Spare, MD as Referring Physician (Ophthalmology) Wenda Overland, [place] as Social Worker  ASSESSMENT & PLAN:  Pancytopenia, acquired [facility]) Recent blood work showed recurrent anemia B12 and iron studies are adequate We discussed some of the risks, benefits, and alternatives of blood transfusions. The patient is symptomatic from anemia and the hemoglobin level is critically low.  Some of the side-effects to be expected including risks of transfusion reactions, chills, infection, syndrome of volume overload and risk of hospitalization from various reasons and the patient is willing to proceed and went ahead to sign consent today. He will receive a unit of blood today He is profoundly symptomatic when his hemoglobin approaches 8 Given the interval of almost 5 weeks between transfusions, I recommend bringing him back early January for possible transfusion again and he agreed  Orders Placed This Encounter  Procedures   Informed Consent Details: Physician/Practitioner Attestation; Transcribe to consent form and obtain patient signature    Standing Status:   Future    Standing Expiration Date:   01/07/2024    Order Specific Question:   Physician/Practitioner attestation of informed consent for blood and or blood product transfusion    Answer:   I, the physician/practitioner, attest that I have discussed with the patient the benefits, risks, side effects, alternatives, likelihood of achieving goals and potential problems during recovery for the procedure that I have provided informed consent.    Order Specific Question:   Product(s)    Answer:   All Product(s)   Care order/instruction    Transfuse Parameters    Standing Status:   Future    Standing Expiration Date:   01/07/2024   Type and screen         Standing Status:   Future    Number of Occurrences:   1     Standing Expiration Date:   01/07/2024    All questions were answered. The patient knows to call the clinic with any problems, questions or concerns. The total time spent in the appointment was 25 minutes encounter with patients including review of chart and various tests results, discussions about plan of care and coordination of care plan   Artis Delay, MD 01/07/2023 1:55 PM  INTERVAL HISTORY: Please see below for problem oriented charting. he returns for transfusion support He is symptomatic from dizziness and shortness of breath on minimal exertion Recent CBC from his primary care doctor's office revealed his hemoglobin was down to 7.9 He denies recent bleeding We discussed risk and benefits of transfusion today  REVIEW OF SYSTEMS:   Constitutional: Denies fevers, chills or abnormal weight loss Eyes: Denies blurriness of vision Ears, nose, mouth, throat, and face: Denies mucositis or sore throat Respiratory: Denies cough, dyspnea or wheezes Cardiovascular: Denies palpitation, chest discomfort or lower extremity swelling Gastrointestinal:  Denies nausea, heartburn or change in bowel habits Skin: Denies abnormal skin rashes Lymphatics: Denies new lymphadenopathy or easy bruising Neurological:Denies numbness, tingling or new weaknesses Behavioral/Psych: Mood is stable, no new changes  All other systems were reviewed with the patient and are negative.  I have reviewed the past medical history, past surgical history, social history and family history with the patient and they are unchanged from previous note.  ALLERGIES:  is allergic to sulfa antibiotics.  MEDICATIONS:  Current Outpatient Medications  Medication Sig Dispense Refill   acetaminophen (TYLENOL) 325 MG tablet Take 650 mg  by mouth every 6 (six) hours as needed.     aspirin EC 81 MG tablet Take 162 mg by mouth daily.     CALCIUM CARB-CHOLECALCIFEROL PO Take 1,250 mg by mouth daily.     Cyanocobalamin (VITAMIN B-12)  500 MCG SUBL Place 1 tablet under the tongue daily.      folic acid (FOLVITE) 400 MCG tablet Take 400 mcg by mouth daily.     gabapentin (NEURONTIN) 300 MG capsule TAKE 2 CAPSULES BY MOUTH 2 TIMES DAILY. 360 capsule 3   Multiple Vitamin (MULTIVITAMIN) tablet Take 1 tablet by mouth daily.       polyethylene glycol powder (GLYCOLAX/MIRALAX) 17 GM/SCOOP powder Take 17 g by mouth daily as needed.     predniSONE (DELTASONE) 5 MG tablet TAKE 1 TABLET BY MOUTH ON MONDAYS, WEDS, AND FRIDAYS, AND TAKE 2 TABS OTHER DAYS OF THE WEEK 90 tablet 1   timolol (TIMOPTIC) 0.5 % ophthalmic solution Place 1 drop into both eyes at bedtime. One drop in each eye at bedtime.     No current facility-administered medications for this visit.    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 T-cell receptor rearrangement study is still pending   12/19/2015 - 08/03/2022 Chemotherapy   The patient was started on 10 mg once a week methotrexate. The dose is increased to 12.5 mg starting 01/16/16, subsequently discontinued due to frail status and worsening anemia      PHYSICAL EXAMINATION: ECOG PERFORMANCE STATUS: 2 - Symptomatic, <50% confined to bed  Vitals:   01/07/23 1348  BP: 123/72  Pulse: 69  Resp: 18  SpO2: 95%   There were no vitals filed for this visit.  GENERAL:alert, no distress and comfortable   LABORATORY DATA:  I have reviewed the data as listed    Component Value Date/Time   NA 141 12/02/2022 0841   NA 138 12/15/2016 1319   K 4.1 12/02/2022 0841   K 4.1 12/15/2016 1319   CL 107 12/02/2022 0841   CO2 27 12/02/2022 0841   CO2 24 12/15/2016 1319   GLUCOSE 95 12/02/2022 0841   GLUCOSE 123 12/15/2016 1319   BUN 21 12/02/2022 0841   BUN 13.7 12/15/2016 1319   CREATININE 1.14 12/02/2022 0841   CREATININE 1.2 12/15/2016 1319   CALCIUM 8.5 12/02/2022 0841    CALCIUM 8.8 12/15/2016 1319   PROT 6.1 12/02/2022 0841   PROT 6.5 12/15/2016 1319   PROT 6.2 12/15/2016 1319   ALBUMIN 3.4 (L) 12/02/2022 0841   ALBUMIN 3.5 12/15/2016 1319   AST 27 12/02/2022 0841   AST 66 (H) 12/15/2016 1319   ALT 41 12/02/2022 0841   ALT 90 (H) 12/15/2016 1319   ALKPHOS 101 12/02/2022 0841   ALKPHOS 116 12/15/2016 1319   BILITOT 1.1 12/02/2022 0841   BILITOT 1.13 12/15/2016 1319   GFRNONAA >60 06/18/2022 1046   GFRAA >60 06/27/2019 0857    No results found for: "SPEP", "UPEP"  Lab Results  Component Value Date   WBC 3.6 (L) 01/07/2023   NEUTROABS 0.9 (L) 01/07/2023   HGB 8.0 (L) 01/07/2023   HCT 24.6 (L) 01/07/2023   MCV 117.1 (H) 01/07/2023   PLT 232 01/07/2023      Chemistry      Component Value Date/Time   NA 141 12/02/2022 0841   NA 138 12/15/2016 1319   K 4.1 12/02/2022 0841  K 4.1 12/15/2016 1319   CL 107 12/02/2022 0841   CO2 27 12/02/2022 0841   CO2 24 12/15/2016 1319   BUN 21 12/02/2022 0841   BUN 13.7 12/15/2016 1319   CREATININE 1.14 12/02/2022 0841   CREATININE 1.2 12/15/2016 1319      Component Value Date/Time   CALCIUM 8.5 12/02/2022 0841   CALCIUM 8.8 12/15/2016 1319   ALKPHOS 101 12/02/2022 0841   ALKPHOS 116 12/15/2016 1319   AST 27 12/02/2022 0841   AST 66 (H) 12/15/2016 1319   ALT 41 12/02/2022 0841   ALT 90 (H) 12/15/2016 1319   BILITOT 1.1 12/02/2022 0841   BILITOT 1.13 12/15/2016 1319

## 2023-01-07 NOTE — Patient Instructions (Signed)
Blood Transfusion, Adult A blood transfusion is a procedure in which you receive blood or a type of blood cell (blood component) through an IV. You may need a blood transfusion when you have a low blood count, which is a low number of any blood cell. This may result from a bleeding disorder, illness, injury, or surgery. The blood may come from a donor, or you may be able to have your own blood collected and stored (autologous blood donation) before a planned surgery. The blood given in a transfusion may be made up of different blood components. You may receive: Red blood cells. These carry oxygen to the cells in the body. Platelets. These help your blood to clot. Plasma. This is the liquid part of your blood. It carries proteins and other substances throughout the body. White blood cells. These help you fight infections. If you have hemophilia or another clotting disorder, you may also receive other types of blood products. Depending on the type of blood product, this procedure may take 1-4 hours to complete. Tell a health care provider about: Any bleeding problems you have. Any previous reactions you have had during a blood transfusion. Any allergies you have. All medicines you are taking, including vitamins, herbs, eye drops, creams, and over-the-counter medicines. Any surgeries you have had. Any medical conditions you have. Whether you are pregnant or may be pregnant. What are the risks? Talk with your health care provider about risks. The most common problems include: A mild allergic reaction, such as red, swollen areas of skin (hives) and itching. Fever or chills. This may be the body's response to new blood cells received. This may occur during or up to 4 hours after the transfusion. More serious problems may include: A serious allergic reaction that causes difficulty breathing or swelling around the face and lips. Transfusion-associated circulatory overload (TACO), or too much fluid in  the lungs. This may cause breathing problems. Transfusion-related acute lung injury (TRALI), which causes breathing difficulty and low oxygen in the blood. This can occur within hours of the transfusion or several days later. Iron overload. This can happen after receiving many blood transfusions over a period of time. Infection or virus being transmitted. This is rare because donated blood is carefully tested before it is given. Hemolytic transfusion reaction. This is rare. It happens when the body's defense system (immune system)tries to attack the new blood cells. Symptoms may include fever, chills, nausea, low blood pressure, and low back or chest pain. Transfusion-associated graft-versus-host disease (TAGVHD). This is rare. It happens when donated cells attack the body's healthy tissues. What happens before the procedure? You will have a blood test to check your blood type. This test is done to know what kind of blood your body will accept and to match it to the donor blood. If you are going to have a planned surgery, you may be able to do an autologous blood donation. This may be done in case you need to have a transfusion. You will have your temperature, blood pressure, and pulse checked before the transfusion. If you have had an allergic reaction to a transfusion in the past, you may be given medicine to help prevent a reaction. This medicine may be given to you by mouth (orally) or through an IV. What happens during the procedure?  An IV will be inserted into one of your veins. The bag of blood will be attached to your IV. The blood will then enter through your vein. Your temperature, blood pressure,   and pulse will be monitored during the transfusion. This monitoring is done to detect early signs of a transfusion reaction. Tell your nurse right away if you have any of these symptoms during the transfusion: Shortness of breath or trouble breathing. Chest or back pain. Fever or  chills. Itching or hives. If you have any signs or symptoms of a reaction, your transfusion will be stopped and you may be given medicine. When the transfusion is complete, your IV will be removed. Pressure may be applied to the IV site for a few minutes. A bandage (dressing)will be applied. The procedure may vary among health care providers and hospitals. What happens after the procedure? Your temperature, blood pressure, pulse, breathing rate, and blood oxygen level will be monitored until you leave the hospital or clinic. Your blood may be tested to see how you have responded to the transfusion. You may be warmed with fluids or blankets to maintain a normal body temperature. If you receive your blood transfusion in an outpatient setting, you will be told whom to contact to report any reactions. Where to find more information Visit the [employer]: redcross.org Summary A blood transfusion is a procedure in which you receive blood or a type of blood cell (blood component) through an IV. The blood given in a transfusion may be made up of different blood components. You may receive red blood cells, platelets, plasma, or white blood cells depending on the condition treated. Your temperature, blood pressure, and pulse will be monitored before, during, and after the transfusion. After the transfusion, your blood may be tested to see how your body has responded. This information is not intended to replace advice given to you by your health care provider. Make sure you discuss any questions you have with your health care provider. Document Revised: 04/18/2021 Document Reviewed: 04/18/2021 Elsevier Patient Education  2024 Elsevier Inc.  

## 2023-01-08 LAB — TYPE AND SCREEN
ABO/RH(D): O POS
Antibody Screen: NEGATIVE
Unit division: 0

## 2023-01-08 LAB — BPAM RBC
Blood Product Expiration Date: 202412312359
ISSUE DATE / TIME: 202412051456
Unit Type and Rh: 5100

## 2023-01-13 ENCOUNTER — Other Ambulatory Visit: Payer: Self-pay | Admitting: Family Medicine

## 2023-01-13 ENCOUNTER — Encounter: Payer: Self-pay | Admitting: Family Medicine

## 2023-01-13 DIAGNOSIS — S91309A Unspecified open wound, unspecified foot, initial encounter: Secondary | ICD-10-CM

## 2023-01-13 DIAGNOSIS — R5381 Other malaise: Secondary | ICD-10-CM

## 2023-01-13 MED ORDER — CEPHALEXIN 500 MG PO CAPS: 500.0000 mg | ORAL_CAPSULE | Freq: Three times a day (TID) | ORAL | 0 refills | Status: DC

## 2023-01-13 NOTE — Progress Notes (Signed)
Please check to see if home health can be arranged based on his previous office visit and the photos that were most recently sent via MyChart.  If not, please let me know.  Thanks.

## 2023-01-14 NOTE — Progress Notes (Signed)
Please update patient.  Insurance will not cover home health without face to face (or video) visit.  Phone visit w/o video will not suffice.    This is an insurance requirement and not my preference.  Please set up when possible.  Thanks.

## 2023-01-14 NOTE — Progress Notes (Signed)
I am okay with either type of visit- whichever is better for the patient.  Video visit is okay and should suffice here.  If he can get seen at the wound clinic in the meantime, then I support that.

## 2023-01-15 ENCOUNTER — Telehealth: Payer: Self-pay | Admitting: Family Medicine

## 2023-01-15 NOTE — Telephone Encounter (Signed)
Sent message out to [employer] about order that was placed for lightweight wheelchair and hospital bed to see if patient received. Awaiting response

## 2023-01-15 NOTE — Telephone Encounter (Signed)
Thanks.  Please let me know what you hear.

## 2023-01-15 NOTE — Telephone Encounter (Signed)
Asia from Palliative care called stating that patient has a  virtual appointment on Monday 01/18/2023 with Dr Para March.She would like to know if its okay for him to complete a  Face to face as well  for the medical equipment that he needs for a hospital bed and transport wheelchair.

## 2023-01-18 ENCOUNTER — Encounter: Payer: Self-pay | Admitting: Family Medicine

## 2023-01-18 ENCOUNTER — Telehealth (INDEPENDENT_AMBULATORY_CARE_PROVIDER_SITE_OTHER): Payer: Medicare PPO | Admitting: Family Medicine

## 2023-01-18 VITALS — Ht 76.0 in | Wt 185.0 lb

## 2023-01-18 DIAGNOSIS — L97411 Non-pressure chronic ulcer of right heel and midfoot limited to breakdown of skin: Secondary | ICD-10-CM | POA: Diagnosis not present

## 2023-01-18 DIAGNOSIS — L97422 Non-pressure chronic ulcer of left heel and midfoot with fat layer exposed: Secondary | ICD-10-CM | POA: Diagnosis not present

## 2023-01-18 DIAGNOSIS — T380X5A Adverse effect of glucocorticoids and synthetic analogues, initial encounter: Secondary | ICD-10-CM

## 2023-01-18 DIAGNOSIS — G720 Drug-induced myopathy: Secondary | ICD-10-CM | POA: Diagnosis not present

## 2023-01-18 DIAGNOSIS — I739 Peripheral vascular disease, unspecified: Secondary | ICD-10-CM | POA: Diagnosis not present

## 2023-01-18 DIAGNOSIS — S91309A Unspecified open wound, unspecified foot, initial encounter: Secondary | ICD-10-CM | POA: Diagnosis not present

## 2023-01-18 DIAGNOSIS — B351 Tinea unguium: Secondary | ICD-10-CM | POA: Diagnosis not present

## 2023-01-18 NOTE — Telephone Encounter (Signed)
Reached out to [employer] through [employer]. Awaiting a response.

## 2023-01-18 NOTE — Assessment & Plan Note (Signed)
Heel wounds with h/o myopathy.  Refer for home health wound care.  Will d/w staff about adjustable bed and transport chair.    Continue keflex, f/u with podiatry, keep wounds clean and covered in the meantime.  Will have HH wound eval.    Pt and daughter agree with the plan.

## 2023-01-18 NOTE — Progress Notes (Signed)
Virtual visit completed through caregility or similar program Patient location: home  Provider location: [place] at [facility], office  Participants: Patient, daughter, and me (unless stated otherwise below)  Limitations and rationale for visit method d/w patient.  Patient agreed to proceed.  Patient identified by 2 identifiers. If vitals are not listed, then patient was unable to self-report due to a lack of equipment at home via telehealth  CC: foot ulcer.  HPI:  Has home health order pending, patient has not yet been contacted.  Started keflex in the meantime.  Met with palliative care in the meantime, had SW eval.  Needs adjustable bed for home, can't get repositioned in a regular bed.  He needs independent head and foot adjustment and also needs height adjustment.  Needs a lightweight transport wheelchair.  Has [employer] but doesn't have a set DME supplier. H/o myopathy affecting gait.   Discussed "floating" his heels to keep pressure off the area.  He has neuropathy at baseline.    Wounds were dressed yesterday, and looked improved vs not worse per family report.  He is on wait list for would clinic.  Has podiatry eval pending for today.    Discussed using a doughnut pillow to protect his sacral area.    Meds and allergies reviewed.   ROS: Per HPI unless specifically indicated in ROS section   NAD Speech wnl  A/P:  Heel wounds with h/o myopathy.  Refer for home health wound care.  Will d/w staff about adjustable bed and transport chair.    Continue keflex, f/u with podiatry, keep wounds clean and covered in the meantime.  Will have HH wound eval.    Pt and daughter agree with the plan.

## 2023-01-19 DIAGNOSIS — L89610 Pressure ulcer of right heel, unstageable: Secondary | ICD-10-CM | POA: Diagnosis not present

## 2023-01-19 NOTE — Telephone Encounter (Signed)
This is what I received back from [employer]: Provider will need to either add to his OV note or create a new progress note basically stating the patient DX and how the patient will benefit from the [facility].

## 2023-01-20 DIAGNOSIS — L89612 Pressure ulcer of right heel, stage 2: Secondary | ICD-10-CM | POA: Diagnosis not present

## 2023-01-20 DIAGNOSIS — T380X5D Adverse effect of glucocorticoids and synthetic analogues, subsequent encounter: Secondary | ICD-10-CM | POA: Diagnosis not present

## 2023-01-20 DIAGNOSIS — Z7952 Long term (current) use of systemic steroids: Secondary | ICD-10-CM | POA: Diagnosis not present

## 2023-01-20 DIAGNOSIS — L89622 Pressure ulcer of left heel, stage 2: Secondary | ICD-10-CM | POA: Diagnosis not present

## 2023-01-20 DIAGNOSIS — Z7982 Long term (current) use of aspirin: Secondary | ICD-10-CM | POA: Diagnosis not present

## 2023-01-20 DIAGNOSIS — G720 Drug-induced myopathy: Secondary | ICD-10-CM | POA: Diagnosis not present

## 2023-01-20 DIAGNOSIS — Z792 Long term (current) use of antibiotics: Secondary | ICD-10-CM | POA: Diagnosis not present

## 2023-01-20 DIAGNOSIS — G629 Polyneuropathy, unspecified: Secondary | ICD-10-CM | POA: Diagnosis not present

## 2023-01-20 DIAGNOSIS — R5381 Other malaise: Secondary | ICD-10-CM | POA: Diagnosis not present

## 2023-01-21 NOTE — Telephone Encounter (Signed)
I updated the note.  Please let me know if that will not suffice.

## 2023-01-21 NOTE — Telephone Encounter (Signed)
Awaiting to hear back from [employer] in reference to wheelchair.

## 2023-01-22 ENCOUNTER — Telehealth: Payer: Self-pay

## 2023-01-22 ENCOUNTER — Other Ambulatory Visit: Payer: Medicare PPO

## 2023-01-22 NOTE — Telephone Encounter (Signed)
Please give the order.  Thanks.   

## 2023-01-22 NOTE — Telephone Encounter (Signed)
Called Cara left voicemail of Dr. Armanda Heritage order

## 2023-01-22 NOTE — Telephone Encounter (Signed)
Copied from CRM [id number]. Topic: Clinical - Home Health Verbal Orders >> Jan 22, 2023  1:56 PM Benetta Spar A wrote: Caller/Agency: Charlynne Pander from [facility] Callback Number: [phone] ** Charlynne Pander said that voicemail is secure and caller may leave voicemail Service Requested: Skilled Nursing Frequency: Once a week for Eight Weeks Any new concerns about the patient? No

## 2023-01-28 NOTE — Telephone Encounter (Signed)
error 

## 2023-01-30 DIAGNOSIS — Z7982 Long term (current) use of aspirin: Secondary | ICD-10-CM | POA: Diagnosis not present

## 2023-01-30 DIAGNOSIS — L89622 Pressure ulcer of left heel, stage 2: Secondary | ICD-10-CM | POA: Diagnosis not present

## 2023-01-30 DIAGNOSIS — T380X5D Adverse effect of glucocorticoids and synthetic analogues, subsequent encounter: Secondary | ICD-10-CM | POA: Diagnosis not present

## 2023-01-30 DIAGNOSIS — G629 Polyneuropathy, unspecified: Secondary | ICD-10-CM | POA: Diagnosis not present

## 2023-01-30 DIAGNOSIS — Z792 Long term (current) use of antibiotics: Secondary | ICD-10-CM | POA: Diagnosis not present

## 2023-01-30 DIAGNOSIS — Z7952 Long term (current) use of systemic steroids: Secondary | ICD-10-CM | POA: Diagnosis not present

## 2023-01-30 DIAGNOSIS — R5381 Other malaise: Secondary | ICD-10-CM | POA: Diagnosis not present

## 2023-01-30 DIAGNOSIS — G720 Drug-induced myopathy: Secondary | ICD-10-CM | POA: Diagnosis not present

## 2023-01-30 DIAGNOSIS — L89612 Pressure ulcer of right heel, stage 2: Secondary | ICD-10-CM | POA: Diagnosis not present

## 2023-02-02 ENCOUNTER — Ambulatory Visit: Payer: Self-pay | Admitting: Podiatry

## 2023-02-05 ENCOUNTER — Other Ambulatory Visit: Payer: Self-pay | Admitting: Family Medicine

## 2023-02-05 ENCOUNTER — Telehealth: Payer: Self-pay | Admitting: Family Medicine

## 2023-02-05 DIAGNOSIS — G629 Polyneuropathy, unspecified: Secondary | ICD-10-CM | POA: Diagnosis not present

## 2023-02-05 DIAGNOSIS — L89612 Pressure ulcer of right heel, stage 2: Secondary | ICD-10-CM | POA: Diagnosis not present

## 2023-02-05 DIAGNOSIS — R5381 Other malaise: Secondary | ICD-10-CM | POA: Diagnosis not present

## 2023-02-05 DIAGNOSIS — L89622 Pressure ulcer of left heel, stage 2: Secondary | ICD-10-CM | POA: Diagnosis not present

## 2023-02-05 DIAGNOSIS — G720 Drug-induced myopathy: Secondary | ICD-10-CM | POA: Diagnosis not present

## 2023-02-05 DIAGNOSIS — T380X5D Adverse effect of glucocorticoids and synthetic analogues, subsequent encounter: Secondary | ICD-10-CM | POA: Diagnosis not present

## 2023-02-05 DIAGNOSIS — Z7982 Long term (current) use of aspirin: Secondary | ICD-10-CM | POA: Diagnosis not present

## 2023-02-05 DIAGNOSIS — Z7952 Long term (current) use of systemic steroids: Secondary | ICD-10-CM | POA: Diagnosis not present

## 2023-02-05 DIAGNOSIS — Z792 Long term (current) use of antibiotics: Secondary | ICD-10-CM | POA: Diagnosis not present

## 2023-02-05 MED ORDER — NONFORMULARY OR COMPOUNDED ITEM: 0 refills | Status: AC

## 2023-02-05 NOTE — Telephone Encounter (Signed)
 Can you clarify this for me?  Does he need a separate letter or is there another form that they are requiring?

## 2023-02-05 NOTE — Telephone Encounter (Signed)
 Spoke with Dr. Para March in person as to what additionally is needed per [employer]. He provided and rx. I sent via fax the rx, the orders and the last office notes to [employer]

## 2023-02-05 NOTE — Telephone Encounter (Signed)
 Copied from CRM [id number]. Topic: Referral - Question >> Feb 05, 2023 10:10 AM Deaijah H wrote: Reason for CRM: Amy with [employer] for insurance to pay for wheelchair referral rx separate from narrative need it resent // incase for callback # is [phone]

## 2023-02-05 NOTE — Telephone Encounter (Signed)
 Called [employer] and spoke with Selena Batten to clarify what exactly is needed The narrative and the prescription needs to come on 2 separate forms.

## 2023-02-08 DIAGNOSIS — I739 Peripheral vascular disease, unspecified: Secondary | ICD-10-CM | POA: Diagnosis not present

## 2023-02-08 DIAGNOSIS — L97422 Non-pressure chronic ulcer of left heel and midfoot with fat layer exposed: Secondary | ICD-10-CM | POA: Diagnosis not present

## 2023-02-08 DIAGNOSIS — L97411 Non-pressure chronic ulcer of right heel and midfoot limited to breakdown of skin: Secondary | ICD-10-CM | POA: Diagnosis not present

## 2023-02-09 ENCOUNTER — Other Ambulatory Visit: Payer: Self-pay | Admitting: Hematology and Oncology

## 2023-02-11 ENCOUNTER — Other Ambulatory Visit: Payer: Self-pay

## 2023-02-11 ENCOUNTER — Encounter: Payer: Self-pay | Admitting: Hematology and Oncology

## 2023-02-11 ENCOUNTER — Inpatient Hospital Stay: Payer: Medicare PPO

## 2023-02-11 ENCOUNTER — Inpatient Hospital Stay: Payer: Medicare PPO | Admitting: Hematology and Oncology

## 2023-02-11 ENCOUNTER — Inpatient Hospital Stay: Payer: Medicare PPO | Attending: Hematology and Oncology

## 2023-02-11 VITALS — BP 109/65 | HR 69 | Temp 98.2°F | Resp 18 | Ht 76.0 in | Wt 185.0 lb

## 2023-02-11 DIAGNOSIS — C91Z0 Other lymphoid leukemia not having achieved remission: Secondary | ICD-10-CM | POA: Diagnosis not present

## 2023-02-11 DIAGNOSIS — Z79899 Other long term (current) drug therapy: Secondary | ICD-10-CM | POA: Insufficient documentation

## 2023-02-11 DIAGNOSIS — Z7952 Long term (current) use of systemic steroids: Secondary | ICD-10-CM | POA: Diagnosis not present

## 2023-02-11 DIAGNOSIS — D539 Nutritional anemia, unspecified: Secondary | ICD-10-CM | POA: Insufficient documentation

## 2023-02-11 LAB — CBC WITH DIFFERENTIAL/PLATELET
Abs Immature Granulocytes: 0.03 10*3/uL (ref 0.00–0.07)
Basophils Absolute: 0 10*3/uL (ref 0.0–0.1)
Basophils Relative: 1 %
Eosinophils Absolute: 0.1 10*3/uL (ref 0.0–0.5)
Eosinophils Relative: 3 %
HCT: 25.5 % — ABNORMAL LOW (ref 39.0–52.0)
Hemoglobin: 8.5 g/dL — ABNORMAL LOW (ref 13.0–17.0)
Immature Granulocytes: 1 %
Lymphocytes Relative: 64 %
Lymphs Abs: 2.5 10*3/uL (ref 0.7–4.0)
MCH: 36.6 pg — ABNORMAL HIGH (ref 26.0–34.0)
MCHC: 33.3 g/dL (ref 30.0–36.0)
MCV: 109.9 fL — ABNORMAL HIGH (ref 80.0–100.0)
Monocytes Absolute: 0.1 10*3/uL (ref 0.1–1.0)
Monocytes Relative: 4 %
Neutro Abs: 1 10*3/uL — ABNORMAL LOW (ref 1.7–7.7)
Neutrophils Relative %: 27 %
Platelets: 251 10*3/uL (ref 150–400)
RBC: 2.32 MIL/uL — ABNORMAL LOW (ref 4.22–5.81)
WBC: 3.8 10*3/uL — ABNORMAL LOW (ref 4.0–10.5)
nRBC: 0 % (ref 0.0–0.2)

## 2023-02-11 NOTE — Progress Notes (Signed)
 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 He is not symptomatic today His blood pressure is stable and his blood count looks great/stable It is difficult to predict how frequent he would need future blood transfusion support I recommend the patient to reach out to his primary care doctor and have repeat CBC done in about 12 days If his hemoglobin is less than 8 again, we will arrange blood transfusion support I have sent his primary care doctor a message and his daughter will also reach out to the clinic for follow-up appointment  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HCC) From our previous discussion, we are in agreement not to pursue further therapy He will continue chronic prednisone  therapy  No orders of the defined types were placed in this encounter.   All questions were answered. The patient knows to call the clinic with any problems, questions or concerns. The total time spent in the appointment was 20 minutes encounter with patients including review of chart and various tests results, discussions about plan of care and coordination of care plan   Michael Bedford, MD 02/11/2023 3:35 PM  INTERVAL HISTORY: Please see below for problem oriented charting. 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  He was subsequently prescribed antibiotics and wound care.  His wound is healing well. The patient denies any recent signs or symptoms of bleeding such as spontaneous epistaxis, hematuria or  hematochezia. We discussed CBC results today.  He is not symptomatic from anemia.  REVIEW OF SYSTEMS:   Constitutional: Denies fevers, chills or abnormal weight loss Eyes: Denies blurriness of vision Ears, nose, mouth, throat, and face: Denies mucositis or sore throat Respiratory: Denies cough, dyspnea or wheezes Cardiovascular: Denies palpitation, chest discomfort or lower extremity swelling Gastrointestinal:  Denies nausea, heartburn or change in bowel habits Skin: Denies abnormal skin rashes Lymphatics: Denies new lymphadenopathy or easy bruising Neurological:Denies numbness, tingling or new weaknesses Behavioral/Psych: Mood is stable, no new changes  All other systems were reviewed with the patient and are negative.  I have reviewed the past medical history, past surgical history, social history and family history with the patient and they are unchanged from previous note.  ALLERGIES:  is allergic to sulfa antibiotics.  MEDICATIONS:  Current Outpatient Medications  Medication Sig Dispense Refill   acetaminophen  (TYLENOL ) 325 MG tablet Take 650 mg by mouth every 6 (six) hours as needed.     aspirin  EC 81 MG tablet Take 162 mg by mouth daily.     CALCIUM  CARB-CHOLECALCIFEROL PO Take 1,250 mg by mouth daily.     Cyanocobalamin  (VITAMIN B-12) 500 MCG SUBL Place 1 tablet under the tongue daily.      folic acid  (FOLVITE ) 400 MCG tablet Take 400 mcg by mouth daily.     gabapentin  (NEURONTIN ) 300 MG capsule TAKE 2 CAPSULES BY MOUTH 2 TIMES DAILY. 360 capsule 3   Multiple Vitamin (MULTIVITAMIN) tablet Take 1 tablet by mouth daily.       NONFORMULARY OR COMPOUNDED ITEM Lightweight manual wheelchair with seat cushion.  Use as directed.  Dx G72.0, T38.0X5A, S91.309A 1 each 0   polyethylene glycol powder (GLYCOLAX /MIRALAX ) 17 GM/SCOOP powder Take 17 g by mouth daily as needed.     predniSONE  (DELTASONE ) 5 MG tablet TAKE 1 TABLET BY MOUTH ON MONDAYS, WEDS, AND FRIDAYS, AND TAKE 2 TABS OTHER  DAYS OF THE WEEK 90 tablet 1   timolol  (TIMOPTIC ) 0.5 % ophthalmic solution Place 1 drop into both eyes at bedtime. One drop in each eye at bedtime.     No current facility-administered medications for this visit.    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 T-cell receptor rearrangement study is still pending   12/19/2015 - 08/03/2022 Chemotherapy   The patient was started on 10 mg once a week methotrexate . The dose is increased to 12.5 mg starting 01/16/16, subsequently discontinued due to frail status and worsening anemia      PHYSICAL EXAMINATION: ECOG PERFORMANCE STATUS: 1 - Symptomatic but completely ambulatory  Vitals:   02/11/23 1307  BP: 109/65  Pulse: 69  Resp: 18  Temp: 98.2 F (36.8 C)  SpO2: 99%   Filed Weights   02/11/23 1307  Weight: 185 lb (83.9 kg)    GENERAL:alert, no distress and comfortable NEURO: alert & oriented x 3 with fluent speech, no focal motor/sensory deficits  LABORATORY DATA:  I have reviewed the data as listed    Component Value Date/Time   NA 141 12/02/2022 0841   NA 138 12/15/2016 1319   K 4.1 12/02/2022 0841   K 4.1 12/15/2016 1319   CL 107 12/02/2022 0841   CO2 27 12/02/2022 0841   CO2 24 12/15/2016 1319   GLUCOSE 95 12/02/2022 0841   GLUCOSE 123 12/15/2016 1319   BUN 21 12/02/2022 0841   BUN 13.7 12/15/2016 1319   CREATININE 1.14 12/02/2022 0841   CREATININE 1.2 12/15/2016 1319   CALCIUM  8.5 12/02/2022 0841   CALCIUM  8.8 12/15/2016 1319   PROT 6.1 12/02/2022 0841   PROT 6.5 12/15/2016 1319   PROT 6.2 12/15/2016 1319   ALBUMIN 3.4 (L) 12/02/2022 0841   ALBUMIN 3.5 12/15/2016 1319   AST 27 12/02/2022 0841   AST 66 (H) 12/15/2016 1319   ALT 41 12/02/2022 0841   ALT 90 (H) 12/15/2016 1319   ALKPHOS 101 12/02/2022 0841   ALKPHOS 116 12/15/2016 1319   BILITOT 1.1 12/02/2022 0841   BILITOT  1.13 12/15/2016 1319   GFRNONAA >60 06/18/2022 1046   GFRAA >60 06/27/2019 0857    No results found for: SPEP, UPEP  Lab Results  Component Value Date   WBC 3.8 (L) 02/11/2023   NEUTROABS 1.0 (L) 02/11/2023   HGB 8.5 (L) 02/11/2023   HCT 25.5 (L) 02/11/2023   MCV 109.9 (H) 02/11/2023   PLT 251 02/11/2023      Chemistry      Component Value Date/Time   NA 141 12/02/2022 0841   NA 138 12/15/2016 1319   K 4.1 12/02/2022 0841   K 4.1 12/15/2016 1319   CL 107 12/02/2022 0841   CO2 27 12/02/2022 0841   CO2 24 12/15/2016 1319   BUN 21 12/02/2022 0841   BUN 13.7 12/15/2016 1319   CREATININE 1.14 12/02/2022 0841   CREATININE 1.2 12/15/2016 1319      Component Value Date/Time   CALCIUM  8.5 12/02/2022 0841   CALCIUM  8.8 12/15/2016 1319   ALKPHOS 101 12/02/2022 0841   ALKPHOS  116 12/15/2016 1319   AST 27 12/02/2022 0841   AST 66 (H) 12/15/2016 1319   ALT 41 12/02/2022 0841   ALT 90 (H) 12/15/2016 1319   BILITOT 1.1 12/02/2022 0841   BILITOT 1.13 12/15/2016 1319

## 2023-02-11 NOTE — Assessment & Plan Note (Signed)
 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

## 2023-02-11 NOTE — Assessment & Plan Note (Signed)
 From our previous discussion, we are in agreement not to pursue further therapy He will continue chronic prednisone therapy

## 2023-02-12 ENCOUNTER — Telehealth: Payer: Self-pay | Admitting: Family Medicine

## 2023-02-12 ENCOUNTER — Telehealth: Payer: Self-pay

## 2023-02-12 NOTE — Telephone Encounter (Signed)
 Verbal order given to [facility].

## 2023-02-12 NOTE — Telephone Encounter (Signed)
 Called daughter regarding mychart message. Told her Dr. Bertis Ruddy sent a scheduling message for 1/23 and the scheduler will call. She verbalized understanding.

## 2023-02-12 NOTE — Telephone Encounter (Signed)
 Please give the order.  Thanks.

## 2023-02-12 NOTE — Telephone Encounter (Signed)
 Copied from CRM [id number]. Topic: Clinical - Home Health Verbal Orders >> Feb 12, 2023 10:10 AM [name] wrote: Caller/Agency: Lenny Darina Rushing Number: [phone] Service Requested: Skilled Nursing, Home health Frequency: 1 week 7 Any new concerns about the patient was not able to see patient today. Pt's daughter states the dressing was changed already, would not allow them to visit the pt. Called to inform missed visit.

## 2023-02-14 DIAGNOSIS — Z7982 Long term (current) use of aspirin: Secondary | ICD-10-CM | POA: Diagnosis not present

## 2023-02-14 DIAGNOSIS — Z792 Long term (current) use of antibiotics: Secondary | ICD-10-CM | POA: Diagnosis not present

## 2023-02-14 DIAGNOSIS — L89622 Pressure ulcer of left heel, stage 2: Secondary | ICD-10-CM | POA: Diagnosis not present

## 2023-02-14 DIAGNOSIS — T380X5D Adverse effect of glucocorticoids and synthetic analogues, subsequent encounter: Secondary | ICD-10-CM | POA: Diagnosis not present

## 2023-02-14 DIAGNOSIS — Z7952 Long term (current) use of systemic steroids: Secondary | ICD-10-CM | POA: Diagnosis not present

## 2023-02-14 DIAGNOSIS — L89612 Pressure ulcer of right heel, stage 2: Secondary | ICD-10-CM | POA: Diagnosis not present

## 2023-02-14 DIAGNOSIS — G629 Polyneuropathy, unspecified: Secondary | ICD-10-CM | POA: Diagnosis not present

## 2023-02-14 DIAGNOSIS — R5381 Other malaise: Secondary | ICD-10-CM | POA: Diagnosis not present

## 2023-02-14 DIAGNOSIS — G720 Drug-induced myopathy: Secondary | ICD-10-CM | POA: Diagnosis not present

## 2023-02-16 ENCOUNTER — Telehealth: Payer: Self-pay | Admitting: Hematology and Oncology

## 2023-02-16 NOTE — Telephone Encounter (Signed)
 Left patient a vm regarding upcoming appointment

## 2023-02-17 ENCOUNTER — Ambulatory Visit: Payer: Medicare PPO | Admitting: Physician Assistant

## 2023-02-17 DIAGNOSIS — L89622 Pressure ulcer of left heel, stage 2: Secondary | ICD-10-CM | POA: Diagnosis not present

## 2023-02-17 DIAGNOSIS — Z7982 Long term (current) use of aspirin: Secondary | ICD-10-CM | POA: Diagnosis not present

## 2023-02-17 DIAGNOSIS — R5381 Other malaise: Secondary | ICD-10-CM | POA: Diagnosis not present

## 2023-02-17 DIAGNOSIS — T380X5D Adverse effect of glucocorticoids and synthetic analogues, subsequent encounter: Secondary | ICD-10-CM | POA: Diagnosis not present

## 2023-02-17 DIAGNOSIS — G629 Polyneuropathy, unspecified: Secondary | ICD-10-CM | POA: Diagnosis not present

## 2023-02-17 DIAGNOSIS — G720 Drug-induced myopathy: Secondary | ICD-10-CM | POA: Diagnosis not present

## 2023-02-17 DIAGNOSIS — L89612 Pressure ulcer of right heel, stage 2: Secondary | ICD-10-CM | POA: Diagnosis not present

## 2023-02-17 DIAGNOSIS — Z792 Long term (current) use of antibiotics: Secondary | ICD-10-CM | POA: Diagnosis not present

## 2023-02-17 DIAGNOSIS — Z7952 Long term (current) use of systemic steroids: Secondary | ICD-10-CM | POA: Diagnosis not present

## 2023-02-19 DIAGNOSIS — T380X5D Adverse effect of glucocorticoids and synthetic analogues, subsequent encounter: Secondary | ICD-10-CM | POA: Diagnosis not present

## 2023-02-19 DIAGNOSIS — L89612 Pressure ulcer of right heel, stage 2: Secondary | ICD-10-CM | POA: Diagnosis not present

## 2023-02-19 DIAGNOSIS — G720 Drug-induced myopathy: Secondary | ICD-10-CM | POA: Diagnosis not present

## 2023-02-19 DIAGNOSIS — Z7982 Long term (current) use of aspirin: Secondary | ICD-10-CM | POA: Diagnosis not present

## 2023-02-19 DIAGNOSIS — L89622 Pressure ulcer of left heel, stage 2: Secondary | ICD-10-CM | POA: Diagnosis not present

## 2023-02-19 DIAGNOSIS — R5381 Other malaise: Secondary | ICD-10-CM | POA: Diagnosis not present

## 2023-02-19 DIAGNOSIS — Z7952 Long term (current) use of systemic steroids: Secondary | ICD-10-CM | POA: Diagnosis not present

## 2023-02-19 DIAGNOSIS — Z792 Long term (current) use of antibiotics: Secondary | ICD-10-CM | POA: Diagnosis not present

## 2023-02-19 DIAGNOSIS — G629 Polyneuropathy, unspecified: Secondary | ICD-10-CM | POA: Diagnosis not present

## 2023-02-25 ENCOUNTER — Inpatient Hospital Stay: Payer: Medicare PPO

## 2023-02-25 ENCOUNTER — Other Ambulatory Visit: Payer: Self-pay | Admitting: Hematology and Oncology

## 2023-02-25 DIAGNOSIS — Z7952 Long term (current) use of systemic steroids: Secondary | ICD-10-CM | POA: Diagnosis not present

## 2023-02-25 DIAGNOSIS — D539 Nutritional anemia, unspecified: Secondary | ICD-10-CM

## 2023-02-25 DIAGNOSIS — C91Z0 Other lymphoid leukemia not having achieved remission: Secondary | ICD-10-CM | POA: Diagnosis not present

## 2023-02-25 DIAGNOSIS — Z79899 Other long term (current) drug therapy: Secondary | ICD-10-CM | POA: Diagnosis not present

## 2023-02-25 LAB — CBC WITH DIFFERENTIAL/PLATELET
Abs Immature Granulocytes: 0.04 10*3/uL (ref 0.00–0.07)
Basophils Absolute: 0 10*3/uL (ref 0.0–0.1)
Basophils Relative: 0 %
Eosinophils Absolute: 0.1 10*3/uL (ref 0.0–0.5)
Eosinophils Relative: 1 %
HCT: 24.1 % — ABNORMAL LOW (ref 39.0–52.0)
Hemoglobin: 7.9 g/dL — ABNORMAL LOW (ref 13.0–17.0)
Immature Granulocytes: 1 %
Lymphocytes Relative: 75 %
Lymphs Abs: 4.9 10*3/uL — ABNORMAL HIGH (ref 0.7–4.0)
MCH: 38.7 pg — ABNORMAL HIGH (ref 26.0–34.0)
MCHC: 32.8 g/dL (ref 30.0–36.0)
MCV: 118.1 fL — ABNORMAL HIGH (ref 80.0–100.0)
Monocytes Absolute: 0.4 10*3/uL (ref 0.1–1.0)
Monocytes Relative: 6 %
Neutro Abs: 1.1 10*3/uL — ABNORMAL LOW (ref 1.7–7.7)
Neutrophils Relative %: 17 %
Platelets: 253 10*3/uL (ref 150–400)
RBC: 2.04 MIL/uL — ABNORMAL LOW (ref 4.22–5.81)
Smear Review: NORMAL
WBC: 6.6 10*3/uL (ref 4.0–10.5)
nRBC: 0 % (ref 0.0–0.2)

## 2023-02-25 LAB — PREPARE RBC (CROSSMATCH)

## 2023-02-25 LAB — SAMPLE TO BLOOD BANK

## 2023-02-25 MED ORDER — DIPHENHYDRAMINE HCL 25 MG PO CAPS: 25.0000 mg | ORAL_CAPSULE | Freq: Once | ORAL | Status: DC

## 2023-02-25 MED ORDER — ACETAMINOPHEN 325 MG PO TABS
650.0000 mg | ORAL_TABLET | Freq: Once | ORAL | Status: AC
  Administered 2023-02-25: 650 mg via ORAL
  Filled 2023-02-25: qty 2

## 2023-02-25 MED ORDER — SODIUM CHLORIDE 0.9% IV SOLUTION
250.0000 mL | INTRAVENOUS | Status: DC
  Administered 2023-02-25: 100 mL via INTRAVENOUS

## 2023-02-25 NOTE — Patient Instructions (Signed)
Blood Transfusion, Adult A blood transfusion is a procedure in which you receive blood or a type of blood cell (blood component) through an IV. You may need a blood transfusion when you have a low blood count, which is a low number of any blood cell. This may result from a bleeding disorder, illness, injury, or surgery. The blood may come from a donor, or you may be able to have your own blood collected and stored (autologous blood donation) before a planned surgery. The blood given in a transfusion may be made up of different blood components. You may receive: Red blood cells. These carry oxygen to the cells in the body. Platelets. These help your blood to clot. Plasma. This is the liquid part of your blood. It carries proteins and other substances throughout the body. White blood cells. These help you fight infections. If you have hemophilia or another clotting disorder, you may also receive other types of blood products. Depending on the type of blood product, this procedure may take 1-4 hours to complete. Tell a health care provider about: Any bleeding problems you have. Any previous reactions you have had during a blood transfusion. Any allergies you have. All medicines you are taking, including vitamins, herbs, eye drops, creams, and over-the-counter medicines. Any surgeries you have had. Any medical conditions you have. Whether you are pregnant or may be pregnant. What are the risks? Talk with your health care provider about risks. The most common problems include: A mild allergic reaction, such as red, swollen areas of skin (hives) and itching. Fever or chills. This may be the body's response to new blood cells received. This may occur during or up to 4 hours after the transfusion. More serious problems may include: A serious allergic reaction that causes difficulty breathing or swelling around the face and lips. Transfusion-associated circulatory overload (TACO), or too much fluid in  the lungs. This may cause breathing problems. Transfusion-related acute lung injury (TRALI), which causes breathing difficulty and low oxygen in the blood. This can occur within hours of the transfusion or several days later. Iron overload. This can happen after receiving many blood transfusions over a period of time. Infection or virus being transmitted. This is rare because donated blood is carefully tested before it is given. Hemolytic transfusion reaction. This is rare. It happens when the body's defense system (immune system)tries to attack the new blood cells. Symptoms may include fever, chills, nausea, low blood pressure, and low back or chest pain. Transfusion-associated graft-versus-host disease (TAGVHD). This is rare. It happens when donated cells attack the body's healthy tissues. What happens before the procedure? You will have a blood test to check your blood type. This test is done to know what kind of blood your body will accept and to match it to the donor blood. If you are going to have a planned surgery, you may be able to do an autologous blood donation. This may be done in case you need to have a transfusion. You will have your temperature, blood pressure, and pulse checked before the transfusion. If you have had an allergic reaction to a transfusion in the past, you may be given medicine to help prevent a reaction. This medicine may be given to you by mouth (orally) or through an IV. What happens during the procedure?  An IV will be inserted into one of your veins. The bag of blood will be attached to your IV. The blood will then enter through your vein. Your temperature, blood pressure,   and pulse will be monitored during the transfusion. This monitoring is done to detect early signs of a transfusion reaction. Tell your nurse right away if you have any of these symptoms during the transfusion: Shortness of breath or trouble breathing. Chest or back pain. Fever or  chills. Itching or hives. If you have any signs or symptoms of a reaction, your transfusion will be stopped and you may be given medicine. When the transfusion is complete, your IV will be removed. Pressure may be applied to the IV site for a few minutes. A bandage (dressing)will be applied. The procedure may vary among health care providers and hospitals. What happens after the procedure? Your temperature, blood pressure, pulse, breathing rate, and blood oxygen level will be monitored until you leave the hospital or clinic. Your blood may be tested to see how you have responded to the transfusion. You may be warmed with fluids or blankets to maintain a normal body temperature. If you receive your blood transfusion in an outpatient setting, you will be told whom to contact to report any reactions. Where to find more information Visit the [employer]: redcross.org Summary A blood transfusion is a procedure in which you receive blood or a type of blood cell (blood component) through an IV. The blood given in a transfusion may be made up of different blood components. You may receive red blood cells, platelets, plasma, or white blood cells depending on the condition treated. Your temperature, blood pressure, and pulse will be monitored before, during, and after the transfusion. After the transfusion, your blood may be tested to see how your body has responded. This information is not intended to replace advice given to you by your health care provider. Make sure you discuss any questions you have with your health care provider. Document Revised: 04/18/2021 Document Reviewed: 04/18/2021 Elsevier Patient Education  2024 Elsevier Inc.  

## 2023-02-26 DIAGNOSIS — R5381 Other malaise: Secondary | ICD-10-CM | POA: Diagnosis not present

## 2023-02-26 DIAGNOSIS — Z792 Long term (current) use of antibiotics: Secondary | ICD-10-CM | POA: Diagnosis not present

## 2023-02-26 DIAGNOSIS — T380X5D Adverse effect of glucocorticoids and synthetic analogues, subsequent encounter: Secondary | ICD-10-CM | POA: Diagnosis not present

## 2023-02-26 DIAGNOSIS — G629 Polyneuropathy, unspecified: Secondary | ICD-10-CM | POA: Diagnosis not present

## 2023-02-26 DIAGNOSIS — L89622 Pressure ulcer of left heel, stage 2: Secondary | ICD-10-CM | POA: Diagnosis not present

## 2023-02-26 DIAGNOSIS — L89612 Pressure ulcer of right heel, stage 2: Secondary | ICD-10-CM | POA: Diagnosis not present

## 2023-02-26 DIAGNOSIS — G720 Drug-induced myopathy: Secondary | ICD-10-CM | POA: Diagnosis not present

## 2023-02-26 DIAGNOSIS — Z7952 Long term (current) use of systemic steroids: Secondary | ICD-10-CM | POA: Diagnosis not present

## 2023-02-26 DIAGNOSIS — Z7982 Long term (current) use of aspirin: Secondary | ICD-10-CM | POA: Diagnosis not present

## 2023-02-26 LAB — BPAM RBC
Blood Product Expiration Date: 202502242359
ISSUE DATE / TIME: 202501231507
Unit Type and Rh: 5100

## 2023-02-26 LAB — TYPE AND SCREEN
ABO/RH(D): O POS
Antibody Screen: NEGATIVE
Unit division: 0

## 2023-03-04 DIAGNOSIS — L89612 Pressure ulcer of right heel, stage 2: Secondary | ICD-10-CM | POA: Diagnosis not present

## 2023-03-04 DIAGNOSIS — L89622 Pressure ulcer of left heel, stage 2: Secondary | ICD-10-CM | POA: Diagnosis not present

## 2023-03-04 DIAGNOSIS — G720 Drug-induced myopathy: Secondary | ICD-10-CM | POA: Diagnosis not present

## 2023-03-04 DIAGNOSIS — R5381 Other malaise: Secondary | ICD-10-CM | POA: Diagnosis not present

## 2023-03-04 DIAGNOSIS — Z7952 Long term (current) use of systemic steroids: Secondary | ICD-10-CM | POA: Diagnosis not present

## 2023-03-04 DIAGNOSIS — Z7982 Long term (current) use of aspirin: Secondary | ICD-10-CM | POA: Diagnosis not present

## 2023-03-04 DIAGNOSIS — T380X5D Adverse effect of glucocorticoids and synthetic analogues, subsequent encounter: Secondary | ICD-10-CM | POA: Diagnosis not present

## 2023-03-04 DIAGNOSIS — Z792 Long term (current) use of antibiotics: Secondary | ICD-10-CM | POA: Diagnosis not present

## 2023-03-04 DIAGNOSIS — G629 Polyneuropathy, unspecified: Secondary | ICD-10-CM | POA: Diagnosis not present

## 2023-03-09 ENCOUNTER — Ambulatory Visit: Payer: Self-pay | Admitting: Family Medicine

## 2023-03-09 ENCOUNTER — Telehealth: Payer: Self-pay

## 2023-03-09 MED ORDER — CEPHALEXIN 500 MG PO CAPS: 500.0000 mg | ORAL_CAPSULE | Freq: Three times a day (TID) | ORAL | 0 refills | Status: AC

## 2023-03-09 NOTE — Telephone Encounter (Signed)
 Please check with family/patient.  This note has potentially conflicting information re: pain.  Please get update on situation (symptoms, fever, duration) and let me know.    Given his homebound status, could they collect a u/a in a sterile cup prior to starting abx?  Please let me know and I'll work on the orders.  Thanks.

## 2023-03-09 NOTE — Telephone Encounter (Signed)
Spoke with Darl Pikes in reference to bed, message has been sent to [employer] via community message. I did advise once I find out some information I will let her know.

## 2023-03-09 NOTE — Telephone Encounter (Signed)
 Spoke with patient son Adriana. On Sunday when he got out the shower he was laboring and very weak. As of today unable to get out bed alone. Paramedic came out today and checked him out all the vitals are looking good. BP was 117/72 blood sugar was 160. They only thing that the parmadic mentioned is a possible uti. Stings a bit but no dark urine or odor to it. Patient  did not have a fever. They are unable to collect  u/a in a sterile cup/

## 2023-03-09 NOTE — Telephone Encounter (Signed)
,   Chief Complaint: burning with urination Symptoms: burning Frequency: constant Pertinent Negatives: Patient denies fever, pain Disposition: [] ED /[] Urgent Care (no appt availability in office) / [] Appointment(In office/virtual)/ []  [facility]/ [] Home Care/ [] Refused Recommended Disposition /[] [facility]/ [x]  Follow-up with PCP Additional Notes: Recommendation is for Apt within 24 hours.  Daughter reports he can't get out of bed and is immunocompromised. Daughter declined apt and wants pcp to call in an antibiotic for him.  Office to follow up.   Reason for Disposition  Side (flank) or lower back pain present  Answer Assessment - Initial Assessment Questions 1. SYMPTOM: What's the main symptom you're concerned about? (e.g., frequency, incontinence)     Irritation and burning when he urinates.  2. ONSET: When did the  burning  start?     Last month or two 3. PAIN: Is there any pain? If Yes, ask: How bad is it? (Scale: 1-10; mild, moderate, severe)     denies 4. CAUSE: What do you think is causing the symptoms?     unknown 5. OTHER SYMPTOMS: Do you have any other symptoms? (e.g., blood in urine, fever, flank pain, pain with urination)     Burning and pain with urination 6. PREGNANCY: Is there any chance you are pregnant? When was your last menstrual period?     Na  Protocols used: Urinary Symptoms-A-AH

## 2023-03-09 NOTE — Telephone Encounter (Signed)
Copied from CRM [id number]. Topic: Clinical - Medical Advice >> Mar 09, 2023  3:13 PM Alona Bene A wrote: Reason for CRM: Patients daughter Darl Pikes reached out regarding DME equipment (adjustable bed). Please contact patient regarding equipment status.

## 2023-03-09 NOTE — Addendum Note (Signed)
Addended by: Joaquim Nam on: 03/09/2023 03:42 PM   Modules accepted: Orders

## 2023-03-09 NOTE — Telephone Encounter (Signed)
Patients daughter Darl Pikes has been notified

## 2023-03-09 NOTE — Telephone Encounter (Signed)
Given all of that, I would try taking keflex for presumed UTI.  Rx sent.  If clearly worse or not improving, then needs recheck/eval.  Thanks.

## 2023-03-10 ENCOUNTER — Telehealth: Payer: Self-pay | Admitting: Family Medicine

## 2023-03-10 NOTE — Telephone Encounter (Signed)
 Fyi, routing to office  Copied from CRM [id number]. Topic: Clinical - Red Word Triage >> Mar 10, 2023  4:57 PM Graeme ORN wrote: Red Word that prompted transfer to Nurse Triage: Palliative Care patient change in condition. Hypertensive, Lethargic. She already sent provider a secure message - states family is deciding whether they are going to do hospital or hospice route and will decide today and let her know she will need an order if they do decide hospice. Thank You

## 2023-03-10 NOTE — Telephone Encounter (Signed)
 Copied from CRM [id number]. Topic: General - Other >> Mar 10, 2023  2:07 PM Viola F wrote: Reason for CRM: Patient called to follow up with [facility] regarding adjustable hospital bed via [employer] - per last update I see in EPIC, I did let her know [facility] will reach out to her when she finds out more information

## 2023-03-11 NOTE — Telephone Encounter (Signed)
 Returned call to [name] who is on the [facility]. I did advise that some updated information has been sent to [employer] and once I have more information I will reach out.

## 2023-03-11 NOTE — Telephone Encounter (Signed)
 Noted, please see if you can get an update re: patient.  Thanks.

## 2023-03-11 NOTE — Telephone Encounter (Signed)
 Did he opt for hospice?

## 2023-03-11 NOTE — Telephone Encounter (Signed)
 Noted. Thanks.

## 2023-03-11 NOTE — Telephone Encounter (Signed)
 Spoke with patients daughter Devere and overall the patient is not doing good. He is slowly declining but then he has moments where he seems alert and not altered. He may eat some times and other times not. They are giving him IV and trying to keep fluids in him.His wish is to remain at home and not have to enter into the hospital. So they are trying to do all that they can to honor his wishes. He was running a fever at one time up to 104 but it seems to be tapering off.   They are thankful for all the assistance and they will keep you inform.

## 2023-03-11 NOTE — Telephone Encounter (Signed)
 Yes, they are to come out today

## 2023-03-12 ENCOUNTER — Encounter: Payer: Self-pay | Admitting: Family Medicine

## 2023-03-24 ENCOUNTER — Encounter: Payer: Self-pay | Admitting: Hematology and Oncology

## 2023-03-25 ENCOUNTER — Other Ambulatory Visit: Payer: Self-pay | Admitting: Hematology and Oncology

## 2023-04-05 ENCOUNTER — Telehealth: Payer: Self-pay

## 2023-04-05 NOTE — Telephone Encounter (Signed)
 Copied from CRM [id number]. Topic: Clinical - Medical Advice >> Apr 05, 2023 12:32 PM Danika B wrote: Reason for CRM: Patients daughter Orman Matsumura called in as she just received an appointment reminder for her father for this Wednesday and wanted to know what it was for. Made her aware that it was the Annual Wellness Visit which is a telephone visit. She then inquired about whether or not her father needs to keep this appointment as he is now in Hospice. Callback [phone]

## 2023-04-05 NOTE — Telephone Encounter (Signed)
 Spoke with Darl Pikes appointment has been canceled

## 2023-04-05 NOTE — Telephone Encounter (Signed)
 I think it would be okay to defer this.  Thanks.

## 2023-04-08 ENCOUNTER — Ambulatory Visit: Payer: Medicare PPO | Admitting: Hematology and Oncology

## 2023-04-08 ENCOUNTER — Other Ambulatory Visit: Payer: Medicare PPO

## 2023-07-15 ENCOUNTER — Other Ambulatory Visit: Payer: Self-pay | Admitting: Hematology and Oncology

## 2023-10-20 ENCOUNTER — Other Ambulatory Visit: Payer: Self-pay | Admitting: Hematology and Oncology

## 2023-10-21 ENCOUNTER — Telehealth: Payer: Self-pay

## 2023-10-21 NOTE — Telephone Encounter (Signed)
 S/w patient's daughter, Devere, regarding recent refill request received for prednisone  and patient's current situation. Daughter reports that patient is still under hospice care with [facility]. He continues to take the prednisone , but she was under the impression that this prescription had been transferred over to hospice service. Daughter states that she will reach out to hospice RN to determine if this is something hospice will take ownership of and refill or if refill is needed by our office.  Provided daughter with call back number whenever she hears back. Daughter voiced appreciation for the call.

## 2023-10-22 ENCOUNTER — Telehealth: Payer: Self-pay

## 2023-10-22 NOTE — Telephone Encounter (Signed)
 LVM for patient's daughter, Devere, to determine if she had the opportunity to clarify prednisone  prescription question with hospice RN. Provided call back number and requested that she return call at her convenience.
